# Patient Record
Sex: Female | Born: 1945
Health system: Southern US, Community
[De-identification: ages and names within clinical notes are randomized; demographics above are authoritative.]

## PROBLEM LIST (undated history)

## (undated) DIAGNOSIS — E042 Nontoxic multinodular goiter: Secondary | ICD-10-CM

## (undated) DIAGNOSIS — E785 Hyperlipidemia, unspecified: Secondary | ICD-10-CM

## (undated) DIAGNOSIS — K219 Gastro-esophageal reflux disease without esophagitis: Secondary | ICD-10-CM

## (undated) DIAGNOSIS — E079 Disorder of thyroid, unspecified: Secondary | ICD-10-CM

## (undated) DIAGNOSIS — E119 Type 2 diabetes mellitus without complications: Secondary | ICD-10-CM

## (undated) DIAGNOSIS — C801 Malignant (primary) neoplasm, unspecified: Secondary | ICD-10-CM

## (undated) DIAGNOSIS — H269 Unspecified cataract: Secondary | ICD-10-CM

## (undated) DIAGNOSIS — I10 Essential (primary) hypertension: Secondary | ICD-10-CM

## (undated) DIAGNOSIS — R0600 Dyspnea, unspecified: Secondary | ICD-10-CM

## (undated) HISTORY — PX: EYE SURGERY: SHX253

## (undated) HISTORY — DX: Unspecified cataract: H26.9

## (undated) HISTORY — DX: Essential (primary) hypertension: I10

## (undated) HISTORY — PX: TUBAL LIGATION: SHX77

## (undated) HISTORY — DX: Disorder of thyroid, unspecified: E07.9

## (undated) HISTORY — DX: Nontoxic multinodular goiter: E04.2

## (undated) HISTORY — DX: Type 2 diabetes mellitus without complications: E11.9

## (undated) HISTORY — DX: Hyperlipidemia, unspecified: E78.5

## (undated) HISTORY — PX: COSMETIC SURGERY: SHX468

## (undated) HISTORY — PX: CHOLECYSTECTOMY: SHX55

## (undated) HISTORY — PX: GALLBLADDER SURGERY: SHX652

---

## 1999-09-27 ENCOUNTER — Ambulatory Visit (HOSPITAL_COMMUNITY): Admission: RE | Admit: 1999-09-27 | Discharge: 1999-09-27 | Payer: Self-pay | Admitting: *Deleted

## 2001-04-13 ENCOUNTER — Encounter: Payer: Self-pay | Admitting: Cardiology

## 2001-04-13 ENCOUNTER — Encounter: Admission: RE | Admit: 2001-04-13 | Discharge: 2001-04-13 | Payer: Self-pay | Admitting: Cardiology

## 2004-06-04 ENCOUNTER — Ambulatory Visit (HOSPITAL_COMMUNITY): Admission: RE | Admit: 2004-06-04 | Discharge: 2004-06-04 | Payer: Self-pay | Admitting: Cardiology

## 2004-12-09 ENCOUNTER — Ambulatory Visit (HOSPITAL_COMMUNITY): Admission: RE | Admit: 2004-12-09 | Discharge: 2004-12-09 | Payer: Self-pay | Admitting: Cardiology

## 2007-06-17 ENCOUNTER — Encounter: Admission: RE | Admit: 2007-06-17 | Discharge: 2007-06-17 | Payer: Self-pay | Admitting: Cardiology

## 2007-07-06 ENCOUNTER — Ambulatory Visit (HOSPITAL_COMMUNITY): Admission: RE | Admit: 2007-07-06 | Discharge: 2007-07-06 | Payer: Self-pay | Admitting: Cardiology

## 2007-07-06 ENCOUNTER — Encounter (INDEPENDENT_AMBULATORY_CARE_PROVIDER_SITE_OTHER): Payer: Self-pay | Admitting: Cardiology

## 2007-07-06 ENCOUNTER — Ambulatory Visit: Payer: Self-pay | Admitting: Vascular Surgery

## 2007-08-26 ENCOUNTER — Encounter (HOSPITAL_COMMUNITY): Admission: RE | Admit: 2007-08-26 | Discharge: 2007-08-27 | Payer: Self-pay | Admitting: Internal Medicine

## 2011-03-07 NOTE — Procedures (Signed)
[place]  Patient:    Bridget Butler                         MRN: [id number] Proc. Date: 09/27/99 Adm. Date:  [id number] Attending:  Ardeth Perfect CC:         Ardyth Gal. Spruill, M.D.                           Procedure Report  PROCEDURE PERFORMED:  Video colonoscopy.  ENDOSCOPIST:  Knox Saliva, M.D.  INDICATIONS:  A 65 year old female with family history of colon cancer.  PREPARATION:  She is n.p.o. since midnight having taken Phospho-Soda prep and a  cld.  The mucosa is clean throughout.  DEPTH OF INSERTION:  Cecum.  PREPROCEDURE SEDATION:  She received 60 mg of Demerol and 5 mg of Versed intravenously.  In addition, she was in 2 L of nasal cannula oxygen.  DESCRIPTION OF PROCEDURE:  The Olympus video colonoscope was inserted via the rectum and advanced very easily all the way to the cecum.  Cecal landmarks were  identified and photographed.  On withdrawal, the mucosa was carefully evaluated and found to be entirely normal throughout from cecum to retroflex view of the rectum. There were no areas of inflammation, polyposis, diverticulosis or other abnormality.  The patient does have a single noninflamed external hemorrhoidal ag. She tolerated the procedure well.  Pulse, blood pressure and oximetry testing were stable throughout.  She was observed in recovery for 45 minutes and discharged ome alert with a benign abdomen.  IMPRESSION:  Normal colonoscopy with a single external hemorrhoidal tag.  RECOMMENDATIONS:  Consideration could be given to repeat screening in six to 10  years.  Return to the office if needed. DD:  09/27/99 TD:  09/28/99 Job: [id number]

## 2012-03-20 ENCOUNTER — Other Ambulatory Visit: Payer: Self-pay | Admitting: Cardiology

## 2012-06-12 ENCOUNTER — Emergency Department (HOSPITAL_COMMUNITY)
Admission: EM | Admit: 2012-06-12 | Discharge: 2012-06-12 | Discharge status: Against Medical Advice | Payer: Medicare Other | Attending: Emergency Medicine | Admitting: Emergency Medicine

## 2012-06-12 DIAGNOSIS — Z043 Encounter for examination and observation following other accident: Secondary | ICD-10-CM | POA: Insufficient documentation
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
## 2012-06-12 NOTE — ED Notes (Signed)
Patient called in [place] with no answer at this time

## 2012-06-12 NOTE — ED Notes (Signed)
Called again in waiting area; no answer.

## 2012-06-12 NOTE — ED Notes (Signed)
Called for patient in general waiting area, triage waiting, and restrooms.  No answer.  Patient LWBS after triage without notifying staff.

## 2012-06-12 NOTE — ED Notes (Signed)
Pt called from waiting room, triage wait no answer x1

## 2012-06-12 NOTE — ED Notes (Signed)
Called x1 in waiting area to re-assess patient; no answer.

## 2013-06-23 ENCOUNTER — Other Ambulatory Visit: Payer: Self-pay | Admitting: Internal Medicine

## 2013-06-23 DIAGNOSIS — E049 Nontoxic goiter, unspecified: Secondary | ICD-10-CM

## 2013-06-28 ENCOUNTER — Ambulatory Visit (HOSPITAL_COMMUNITY)
Admission: RE | Admit: 2013-06-28 | Discharge: 2013-06-28 | Disposition: A | Discharge status: Home / Self Care | Payer: Medicare Other | Source: Ambulatory Visit | Attending: Internal Medicine | Admitting: Internal Medicine

## 2013-06-28 DIAGNOSIS — E049 Nontoxic goiter, unspecified: Secondary | ICD-10-CM

## 2013-06-28 DIAGNOSIS — E042 Nontoxic multinodular goiter: Secondary | ICD-10-CM | POA: Insufficient documentation

## 2013-07-05 ENCOUNTER — Other Ambulatory Visit: Payer: Self-pay | Admitting: Internal Medicine

## 2013-07-05 DIAGNOSIS — E042 Nontoxic multinodular goiter: Secondary | ICD-10-CM

## 2013-07-05 DIAGNOSIS — E041 Nontoxic single thyroid nodule: Secondary | ICD-10-CM

## 2013-07-13 ENCOUNTER — Other Ambulatory Visit (HOSPITAL_COMMUNITY)
Admission: RE | Admit: 2013-07-13 | Discharge: 2013-07-13 | Disposition: A | Discharge status: Home / Self Care | Payer: Medicare Other | Source: Ambulatory Visit | Attending: Interventional Radiology | Admitting: Interventional Radiology

## 2013-07-13 ENCOUNTER — Ambulatory Visit
Admission: RE | Admit: 2013-07-13 | Discharge: 2013-07-13 | Disposition: A | Discharge status: Home / Self Care | Payer: Medicare Other | Source: Ambulatory Visit | Attending: Internal Medicine | Admitting: Internal Medicine

## 2013-07-13 DIAGNOSIS — E041 Nontoxic single thyroid nodule: Secondary | ICD-10-CM | POA: Insufficient documentation

## 2013-07-13 DIAGNOSIS — E042 Nontoxic multinodular goiter: Secondary | ICD-10-CM

## 2014-10-24 DIAGNOSIS — E049 Nontoxic goiter, unspecified: Secondary | ICD-10-CM | POA: Diagnosis not present

## 2014-10-24 DIAGNOSIS — E0520 Thyrotoxicosis with toxic multinodular goiter without thyrotoxic crisis or storm: Secondary | ICD-10-CM | POA: Diagnosis not present

## 2014-10-24 DIAGNOSIS — E119 Type 2 diabetes mellitus without complications: Secondary | ICD-10-CM | POA: Diagnosis not present

## 2014-10-24 DIAGNOSIS — E1165 Type 2 diabetes mellitus with hyperglycemia: Secondary | ICD-10-CM | POA: Diagnosis not present

## 2014-11-24 DIAGNOSIS — H43821 Vitreomacular adhesion, right eye: Secondary | ICD-10-CM | POA: Diagnosis not present

## 2014-11-24 DIAGNOSIS — Z1231 Encounter for screening mammogram for malignant neoplasm of breast: Secondary | ICD-10-CM | POA: Diagnosis not present

## 2014-11-24 DIAGNOSIS — E11349 Type 2 diabetes mellitus with severe nonproliferative diabetic retinopathy without macular edema: Secondary | ICD-10-CM | POA: Diagnosis not present

## 2014-11-24 DIAGNOSIS — Z803 Family history of malignant neoplasm of breast: Secondary | ICD-10-CM | POA: Diagnosis not present

## 2014-12-19 LAB — HM MAMMOGRAPHY

## 2015-01-05 DIAGNOSIS — I10 Essential (primary) hypertension: Secondary | ICD-10-CM | POA: Diagnosis not present

## 2015-01-05 DIAGNOSIS — E669 Obesity, unspecified: Secondary | ICD-10-CM | POA: Diagnosis not present

## 2015-01-05 DIAGNOSIS — N189 Chronic kidney disease, unspecified: Secondary | ICD-10-CM | POA: Diagnosis not present

## 2015-01-05 DIAGNOSIS — E785 Hyperlipidemia, unspecified: Secondary | ICD-10-CM | POA: Diagnosis not present

## 2015-01-05 DIAGNOSIS — E119 Type 2 diabetes mellitus without complications: Secondary | ICD-10-CM | POA: Diagnosis not present

## 2015-02-09 DIAGNOSIS — E11359 Type 2 diabetes mellitus with proliferative diabetic retinopathy without macular edema: Secondary | ICD-10-CM | POA: Diagnosis not present

## 2015-02-09 DIAGNOSIS — H43821 Vitreomacular adhesion, right eye: Secondary | ICD-10-CM | POA: Diagnosis not present

## 2015-04-04 DIAGNOSIS — H43821 Vitreomacular adhesion, right eye: Secondary | ICD-10-CM | POA: Diagnosis not present

## 2015-04-04 DIAGNOSIS — E11359 Type 2 diabetes mellitus with proliferative diabetic retinopathy without macular edema: Secondary | ICD-10-CM | POA: Diagnosis not present

## 2015-04-10 DIAGNOSIS — N189 Chronic kidney disease, unspecified: Secondary | ICD-10-CM | POA: Diagnosis not present

## 2015-04-10 DIAGNOSIS — E119 Type 2 diabetes mellitus without complications: Secondary | ICD-10-CM | POA: Diagnosis not present

## 2015-04-10 DIAGNOSIS — E785 Hyperlipidemia, unspecified: Secondary | ICD-10-CM | POA: Diagnosis not present

## 2015-04-10 DIAGNOSIS — I10 Essential (primary) hypertension: Secondary | ICD-10-CM | POA: Diagnosis not present

## 2015-04-10 DIAGNOSIS — E669 Obesity, unspecified: Secondary | ICD-10-CM | POA: Diagnosis not present

## 2015-04-11 ENCOUNTER — Encounter: Payer: Self-pay | Admitting: *Deleted

## 2015-08-09 DIAGNOSIS — I10 Essential (primary) hypertension: Secondary | ICD-10-CM | POA: Diagnosis not present

## 2015-08-09 DIAGNOSIS — E785 Hyperlipidemia, unspecified: Secondary | ICD-10-CM | POA: Diagnosis not present

## 2015-08-09 DIAGNOSIS — E119 Type 2 diabetes mellitus without complications: Secondary | ICD-10-CM | POA: Diagnosis not present

## 2015-09-19 DIAGNOSIS — E1165 Type 2 diabetes mellitus with hyperglycemia: Secondary | ICD-10-CM | POA: Diagnosis not present

## 2015-09-19 DIAGNOSIS — E0520 Thyrotoxicosis with toxic multinodular goiter without thyrotoxic crisis or storm: Secondary | ICD-10-CM | POA: Diagnosis not present

## 2015-10-03 DIAGNOSIS — E113511 Type 2 diabetes mellitus with proliferative diabetic retinopathy with macular edema, right eye: Secondary | ICD-10-CM | POA: Diagnosis not present

## 2015-10-03 DIAGNOSIS — E113592 Type 2 diabetes mellitus with proliferative diabetic retinopathy without macular edema, left eye: Secondary | ICD-10-CM | POA: Diagnosis not present

## 2015-10-31 DIAGNOSIS — E6609 Other obesity due to excess calories: Secondary | ICD-10-CM | POA: Diagnosis not present

## 2015-10-31 DIAGNOSIS — E089 Diabetes mellitus due to underlying condition without complications: Secondary | ICD-10-CM | POA: Diagnosis not present

## 2015-10-31 DIAGNOSIS — Z79899 Other long term (current) drug therapy: Secondary | ICD-10-CM | POA: Diagnosis not present

## 2015-10-31 DIAGNOSIS — I10 Essential (primary) hypertension: Secondary | ICD-10-CM | POA: Diagnosis not present

## 2015-11-13 DIAGNOSIS — E113511 Type 2 diabetes mellitus with proliferative diabetic retinopathy with macular edema, right eye: Secondary | ICD-10-CM | POA: Diagnosis not present

## 2015-11-16 DIAGNOSIS — E669 Obesity, unspecified: Secondary | ICD-10-CM | POA: Diagnosis not present

## 2015-11-16 DIAGNOSIS — E089 Diabetes mellitus due to underlying condition without complications: Secondary | ICD-10-CM | POA: Diagnosis not present

## 2015-11-16 DIAGNOSIS — I10 Essential (primary) hypertension: Secondary | ICD-10-CM | POA: Diagnosis not present

## 2015-12-11 DIAGNOSIS — E113511 Type 2 diabetes mellitus with proliferative diabetic retinopathy with macular edema, right eye: Secondary | ICD-10-CM | POA: Diagnosis not present

## 2015-12-12 DIAGNOSIS — E119 Type 2 diabetes mellitus without complications: Secondary | ICD-10-CM | POA: Diagnosis not present

## 2015-12-21 ENCOUNTER — Encounter: Payer: Self-pay | Admitting: Family Medicine

## 2015-12-21 DIAGNOSIS — Z803 Family history of malignant neoplasm of breast: Secondary | ICD-10-CM | POA: Diagnosis not present

## 2015-12-21 DIAGNOSIS — Z780 Asymptomatic menopausal state: Secondary | ICD-10-CM | POA: Diagnosis not present

## 2015-12-21 DIAGNOSIS — M8589 Other specified disorders of bone density and structure, multiple sites: Secondary | ICD-10-CM | POA: Insufficient documentation

## 2015-12-21 DIAGNOSIS — Z1231 Encounter for screening mammogram for malignant neoplasm of breast: Secondary | ICD-10-CM | POA: Diagnosis not present

## 2016-01-23 DIAGNOSIS — E119 Type 2 diabetes mellitus without complications: Secondary | ICD-10-CM | POA: Diagnosis not present

## 2016-01-23 DIAGNOSIS — I10 Essential (primary) hypertension: Secondary | ICD-10-CM | POA: Diagnosis not present

## 2016-02-13 DIAGNOSIS — H02834 Dermatochalasis of left upper eyelid: Secondary | ICD-10-CM | POA: Diagnosis not present

## 2016-02-13 DIAGNOSIS — H02831 Dermatochalasis of right upper eyelid: Secondary | ICD-10-CM | POA: Diagnosis not present

## 2016-02-28 DIAGNOSIS — E6609 Other obesity due to excess calories: Secondary | ICD-10-CM | POA: Diagnosis not present

## 2016-02-28 DIAGNOSIS — Z0001 Encounter for general adult medical examination with abnormal findings: Secondary | ICD-10-CM | POA: Diagnosis not present

## 2016-02-28 DIAGNOSIS — I10 Essential (primary) hypertension: Secondary | ICD-10-CM | POA: Diagnosis not present

## 2016-02-28 DIAGNOSIS — E119 Type 2 diabetes mellitus without complications: Secondary | ICD-10-CM | POA: Diagnosis not present

## 2016-03-12 DIAGNOSIS — H43811 Vitreous degeneration, right eye: Secondary | ICD-10-CM | POA: Diagnosis not present

## 2016-03-12 DIAGNOSIS — E113593 Type 2 diabetes mellitus with proliferative diabetic retinopathy without macular edema, bilateral: Secondary | ICD-10-CM | POA: Diagnosis not present

## 2016-03-12 DIAGNOSIS — H3582 Retinal ischemia: Secondary | ICD-10-CM | POA: Diagnosis not present

## 2016-04-10 ENCOUNTER — Other Ambulatory Visit: Payer: Self-pay | Admitting: Gastroenterology

## 2016-04-10 DIAGNOSIS — K219 Gastro-esophageal reflux disease without esophagitis: Secondary | ICD-10-CM | POA: Diagnosis not present

## 2016-04-10 DIAGNOSIS — K635 Polyp of colon: Secondary | ICD-10-CM | POA: Diagnosis not present

## 2016-04-10 DIAGNOSIS — Z1211 Encounter for screening for malignant neoplasm of colon: Secondary | ICD-10-CM | POA: Diagnosis not present

## 2016-04-10 DIAGNOSIS — D125 Benign neoplasm of sigmoid colon: Secondary | ICD-10-CM | POA: Diagnosis not present

## 2016-04-10 DIAGNOSIS — K222 Esophageal obstruction: Secondary | ICD-10-CM | POA: Diagnosis not present

## 2016-04-10 DIAGNOSIS — K640 First degree hemorrhoids: Secondary | ICD-10-CM | POA: Diagnosis not present

## 2016-07-01 DIAGNOSIS — E089 Diabetes mellitus due to underlying condition without complications: Secondary | ICD-10-CM | POA: Diagnosis not present

## 2016-07-01 DIAGNOSIS — E669 Obesity, unspecified: Secondary | ICD-10-CM | POA: Diagnosis not present

## 2016-07-01 DIAGNOSIS — I10 Essential (primary) hypertension: Secondary | ICD-10-CM | POA: Diagnosis not present

## 2016-07-16 DIAGNOSIS — E113593 Type 2 diabetes mellitus with proliferative diabetic retinopathy without macular edema, bilateral: Secondary | ICD-10-CM | POA: Diagnosis not present

## 2016-07-16 DIAGNOSIS — H43811 Vitreous degeneration, right eye: Secondary | ICD-10-CM | POA: Diagnosis not present

## 2016-07-16 DIAGNOSIS — H3582 Retinal ischemia: Secondary | ICD-10-CM | POA: Diagnosis not present

## 2016-07-18 DIAGNOSIS — E089 Diabetes mellitus due to underlying condition without complications: Secondary | ICD-10-CM | POA: Diagnosis not present

## 2016-07-18 DIAGNOSIS — Z23 Encounter for immunization: Secondary | ICD-10-CM | POA: Diagnosis not present

## 2016-07-18 DIAGNOSIS — E119 Type 2 diabetes mellitus without complications: Secondary | ICD-10-CM | POA: Diagnosis not present

## 2016-07-18 DIAGNOSIS — I10 Essential (primary) hypertension: Secondary | ICD-10-CM | POA: Diagnosis not present

## 2016-07-29 DIAGNOSIS — R0683 Snoring: Secondary | ICD-10-CM | POA: Diagnosis not present

## 2016-08-01 DIAGNOSIS — E6609 Other obesity due to excess calories: Secondary | ICD-10-CM | POA: Diagnosis not present

## 2016-08-01 DIAGNOSIS — E119 Type 2 diabetes mellitus without complications: Secondary | ICD-10-CM | POA: Diagnosis not present

## 2016-08-01 DIAGNOSIS — I10 Essential (primary) hypertension: Secondary | ICD-10-CM | POA: Diagnosis not present

## 2016-09-03 DIAGNOSIS — Z6830 Body mass index (BMI) 30.0-30.9, adult: Secondary | ICD-10-CM | POA: Diagnosis not present

## 2016-09-03 DIAGNOSIS — E6609 Other obesity due to excess calories: Secondary | ICD-10-CM | POA: Diagnosis not present

## 2016-09-03 DIAGNOSIS — E119 Type 2 diabetes mellitus without complications: Secondary | ICD-10-CM | POA: Diagnosis not present

## 2016-09-03 DIAGNOSIS — I10 Essential (primary) hypertension: Secondary | ICD-10-CM | POA: Diagnosis not present

## 2016-11-14 DIAGNOSIS — E6609 Other obesity due to excess calories: Secondary | ICD-10-CM | POA: Diagnosis not present

## 2016-11-14 DIAGNOSIS — I10 Essential (primary) hypertension: Secondary | ICD-10-CM | POA: Diagnosis not present

## 2016-11-14 DIAGNOSIS — E119 Type 2 diabetes mellitus without complications: Secondary | ICD-10-CM | POA: Diagnosis not present

## 2016-12-15 DIAGNOSIS — E119 Type 2 diabetes mellitus without complications: Secondary | ICD-10-CM | POA: Diagnosis not present

## 2016-12-15 DIAGNOSIS — E6609 Other obesity due to excess calories: Secondary | ICD-10-CM | POA: Diagnosis not present

## 2016-12-15 DIAGNOSIS — I10 Essential (primary) hypertension: Secondary | ICD-10-CM | POA: Diagnosis not present

## 2017-01-13 DIAGNOSIS — H3582 Retinal ischemia: Secondary | ICD-10-CM | POA: Diagnosis not present

## 2017-01-13 DIAGNOSIS — E113593 Type 2 diabetes mellitus with proliferative diabetic retinopathy without macular edema, bilateral: Secondary | ICD-10-CM | POA: Diagnosis not present

## 2017-01-13 DIAGNOSIS — H43811 Vitreous degeneration, right eye: Secondary | ICD-10-CM | POA: Diagnosis not present

## 2017-03-17 DIAGNOSIS — E1129 Type 2 diabetes mellitus with other diabetic kidney complication: Secondary | ICD-10-CM | POA: Diagnosis not present

## 2017-03-17 DIAGNOSIS — I10 Essential (primary) hypertension: Secondary | ICD-10-CM | POA: Diagnosis not present

## 2017-03-17 DIAGNOSIS — E6609 Other obesity due to excess calories: Secondary | ICD-10-CM | POA: Diagnosis not present

## 2017-03-17 DIAGNOSIS — E119 Type 2 diabetes mellitus without complications: Secondary | ICD-10-CM | POA: Diagnosis not present

## 2017-03-17 DIAGNOSIS — E669 Obesity, unspecified: Secondary | ICD-10-CM | POA: Diagnosis not present

## 2017-03-17 DIAGNOSIS — H3530 Unspecified macular degeneration: Secondary | ICD-10-CM | POA: Diagnosis not present

## 2017-03-17 DIAGNOSIS — Z7289 Other problems related to lifestyle: Secondary | ICD-10-CM | POA: Diagnosis not present

## 2017-04-16 DIAGNOSIS — Z1231 Encounter for screening mammogram for malignant neoplasm of breast: Secondary | ICD-10-CM | POA: Diagnosis not present

## 2017-04-16 DIAGNOSIS — Z803 Family history of malignant neoplasm of breast: Secondary | ICD-10-CM | POA: Diagnosis not present

## 2017-04-16 LAB — HM MAMMOGRAPHY

## 2017-06-15 DIAGNOSIS — E6609 Other obesity due to excess calories: Secondary | ICD-10-CM | POA: Diagnosis not present

## 2017-06-15 DIAGNOSIS — I10 Essential (primary) hypertension: Secondary | ICD-10-CM | POA: Diagnosis not present

## 2017-06-15 DIAGNOSIS — E11311 Type 2 diabetes mellitus with unspecified diabetic retinopathy with macular edema: Secondary | ICD-10-CM | POA: Diagnosis not present

## 2017-06-15 DIAGNOSIS — E1129 Type 2 diabetes mellitus with other diabetic kidney complication: Secondary | ICD-10-CM | POA: Diagnosis not present

## 2017-06-15 DIAGNOSIS — Z0000 Encounter for general adult medical examination without abnormal findings: Secondary | ICD-10-CM | POA: Diagnosis not present

## 2017-06-30 DIAGNOSIS — H02413 Mechanical ptosis of bilateral eyelids: Secondary | ICD-10-CM | POA: Diagnosis not present

## 2017-06-30 DIAGNOSIS — H53489 Generalized contraction of visual field, unspecified eye: Secondary | ICD-10-CM | POA: Diagnosis not present

## 2017-06-30 DIAGNOSIS — H02831 Dermatochalasis of right upper eyelid: Secondary | ICD-10-CM | POA: Diagnosis not present

## 2017-06-30 DIAGNOSIS — H02834 Dermatochalasis of left upper eyelid: Secondary | ICD-10-CM | POA: Diagnosis not present

## 2017-07-15 DIAGNOSIS — H43821 Vitreomacular adhesion, right eye: Secondary | ICD-10-CM | POA: Diagnosis not present

## 2017-07-15 DIAGNOSIS — H3582 Retinal ischemia: Secondary | ICD-10-CM | POA: Diagnosis not present

## 2017-07-15 DIAGNOSIS — H43811 Vitreous degeneration, right eye: Secondary | ICD-10-CM | POA: Diagnosis not present

## 2017-07-15 DIAGNOSIS — E113593 Type 2 diabetes mellitus with proliferative diabetic retinopathy without macular edema, bilateral: Secondary | ICD-10-CM | POA: Diagnosis not present

## 2017-08-18 DIAGNOSIS — H02413 Mechanical ptosis of bilateral eyelids: Secondary | ICD-10-CM | POA: Diagnosis not present

## 2017-09-14 DIAGNOSIS — E119 Type 2 diabetes mellitus without complications: Secondary | ICD-10-CM | POA: Diagnosis not present

## 2017-09-14 DIAGNOSIS — E6609 Other obesity due to excess calories: Secondary | ICD-10-CM | POA: Diagnosis not present

## 2017-09-14 DIAGNOSIS — E118 Type 2 diabetes mellitus with unspecified complications: Secondary | ICD-10-CM | POA: Diagnosis not present

## 2017-09-14 DIAGNOSIS — I10 Essential (primary) hypertension: Secondary | ICD-10-CM | POA: Diagnosis not present

## 2017-09-14 DIAGNOSIS — E1129 Type 2 diabetes mellitus with other diabetic kidney complication: Secondary | ICD-10-CM | POA: Diagnosis not present

## 2017-09-18 DIAGNOSIS — I10 Essential (primary) hypertension: Secondary | ICD-10-CM | POA: Diagnosis not present

## 2017-12-21 DIAGNOSIS — E785 Hyperlipidemia, unspecified: Secondary | ICD-10-CM | POA: Diagnosis not present

## 2017-12-21 DIAGNOSIS — I10 Essential (primary) hypertension: Secondary | ICD-10-CM | POA: Diagnosis not present

## 2017-12-21 DIAGNOSIS — E118 Type 2 diabetes mellitus with unspecified complications: Secondary | ICD-10-CM | POA: Diagnosis not present

## 2017-12-29 ENCOUNTER — Encounter: Payer: Self-pay | Admitting: Family Medicine

## 2017-12-29 ENCOUNTER — Ambulatory Visit (INDEPENDENT_AMBULATORY_CARE_PROVIDER_SITE_OTHER): Payer: Medicare HMO | Admitting: Family Medicine

## 2017-12-29 ENCOUNTER — Other Ambulatory Visit: Payer: Self-pay

## 2017-12-29 VITALS — BP 122/60 | HR 78 | Temp 98.4°F | Resp 18 | Ht 62.5 in | Wt 205.4 lb

## 2017-12-29 DIAGNOSIS — Z1231 Encounter for screening mammogram for malignant neoplasm of breast: Secondary | ICD-10-CM | POA: Diagnosis not present

## 2017-12-29 DIAGNOSIS — E2839 Other primary ovarian failure: Secondary | ICD-10-CM | POA: Diagnosis not present

## 2017-12-29 DIAGNOSIS — K219 Gastro-esophageal reflux disease without esophagitis: Secondary | ICD-10-CM

## 2017-12-29 DIAGNOSIS — IMO0002 Reserved for concepts with insufficient information to code with codable children: Secondary | ICD-10-CM

## 2017-12-29 DIAGNOSIS — E1165 Type 2 diabetes mellitus with hyperglycemia: Secondary | ICD-10-CM | POA: Diagnosis not present

## 2017-12-29 DIAGNOSIS — E785 Hyperlipidemia, unspecified: Secondary | ICD-10-CM | POA: Diagnosis not present

## 2017-12-29 DIAGNOSIS — I10 Essential (primary) hypertension: Secondary | ICD-10-CM | POA: Diagnosis not present

## 2017-12-29 DIAGNOSIS — Z794 Long term (current) use of insulin: Secondary | ICD-10-CM

## 2017-12-29 DIAGNOSIS — Z1239 Encounter for other screening for malignant neoplasm of breast: Secondary | ICD-10-CM

## 2017-12-29 LAB — POCT GLYCOSYLATED HEMOGLOBIN (HGB A1C): HEMOGLOBIN A1C: 7.6

## 2017-12-29 LAB — GLUCOSE, POCT (MANUAL RESULT ENTRY): POC GLUCOSE: 129 mg/dL — AB (ref 70–99)

## 2017-12-29 MED ORDER — LISINOPRIL-HYDROCHLOROTHIAZIDE 20-12.5 MG PO TABS: 2.0000 | ORAL_TABLET | Freq: Every day | ORAL | 0 refills | Status: DC

## 2017-12-29 MED ORDER — INSULIN GLARGINE 100 UNIT/ML SOLOSTAR PEN: 20.0000 [IU] | PEN_INJECTOR | SUBCUTANEOUS | 0 refills | Status: DC

## 2017-12-29 MED ORDER — RANITIDINE HCL 150 MG PO TABS: 150.0000 mg | ORAL_TABLET | Freq: Two times a day (BID) | ORAL | 1 refills | Status: DC

## 2017-12-29 MED ORDER — DULAGLUTIDE 0.75 MG/0.5ML ~~LOC~~ SOAJ: 0.5000 mL | SUBCUTANEOUS | 0 refills | Status: DC

## 2017-12-29 MED ORDER — DULAGLUTIDE 0.75 MG/0.5ML ~~LOC~~ SOAJ: 0.5000 mL | SUBCUTANEOUS | 1 refills | Status: DC

## 2017-12-29 NOTE — Progress Notes (Addendum)
By signing my name below, I, Theresia Bough, attest that this documentation has been prepared under the direction and in the presence of [name], MD Electronically Signed: Theresia Bough, Medical Scribe 12/29/17 at 11:52 AM  Subjective:    Patient ID: Bridget Butler, female    DOB: Jan 03, 1946, 72 y.o.   MRN: [id number] Chief Complaint  Patient presents with  . Establish Care    pt states she wanted to get to know provider and states she was looking for a new PCP.    HPI Bridget Butler is a 72 y.o. female who presents to Primary Care at [facility] to establish care. She has PMHx of HTN, DM, HLD, and GERD. She was previously followed by Dr. Criss Rosales.   DM She takes 30 units lantus solostar in the AM and janumet 50/100 daily. She states her fasting sugars are between 90-115 and she only checks them in the morning with her meter. She notes to have hypoglycemia sometimes when she does not eat breakfast but she usually does eat something in the morning. .  Vision She is s/p surgery and sees a ophthalmologist, Dr. Baird Cancer regularly.    HTN She takes Lisinopril 20 mg at night and 1 tablet of lisinopril-HCTZ in the morning daily. She states she used to take furosemide but discontinued it due to urinary frequency. She is currently not followed by a Cardiologist.   HLD She has tried Lipitor and Crestor in the past but has discontinued due to muscle cramps followed a week after starting the medication. She takes Zetia currently. She takes 1000 mg fish oil daily.   GERD She takes omeprazole 40 mg in the AM daily. She reports mild-moderate symptoms. She is agreeable to try Zantac.      Cancer Screening Colon CA: She states her last colonoscopy was 2 years ago but she does not know where  Breast CA: She is due for a mammogram at [facility] She denies hx of osteoporosis or osteopenia. She is due for DEXA at [facility]. She has taken Ca and Vitamin D in the past but not currently taking any  supplement.    Review of Systems  Constitutional:       (+) hypoglycemic symptoms when she does not eat and is active  (+) active adult  Eyes:       (+) s/p eye surgery  Gastrointestinal:       (+) mild-moderate GERD symptoms   Musculoskeletal: Negative for myalgias.     Current Outpatient Medications on File Prior to Visit  Medication Sig Dispense Refill  . aspirin EC 81 MG tablet Take 81 mg by mouth daily.    [name] Kitchen ezetimibe (ZETIA) 10 MG tablet Take 10 mg by mouth daily.    . Insulin Glargine (LANTUS SOLOSTAR) 100 UNIT/ML Solostar Pen Inject 30 Units into the skin every morning.    [name] Kitchen lisinopril (PRINIVIL,ZESTRIL) 20 MG tablet Take 20 mg by mouth daily.    [name] Kitchen lisinopril-hydrochlorothiazide (PRINZIDE,ZESTORETIC) 20-12.5 MG per tablet Take 1 tablet by mouth daily.    . Multiple Vitamin (MULTIVITAMIN WITH MINERALS) TABS Take 1 tablet by mouth daily.    . Omega-3 Fatty Acids (FISH OIL) 1000 MG CAPS Take 1 capsule by mouth daily.    [name] Kitchen omeprazole (PRILOSEC) 40 MG capsule Take 40 mg by mouth daily.    [name] Kitchen ACCU-CHEK SMARTVIEW test strip     . [DISCONTINUED] sitaGLIPtin-metformin (JANUMET) 50-1000 MG tablet Take 1 tablet by mouth daily with lunch.  No current facility-administered medications on file prior to visit.    Past Medical History:  Diagnosis Date  . Cataract   . Diabetes mellitus without complication ([place])   . Thyroid disease    Past Surgical History:  Procedure Laterality Date  . COSMETIC SURGERY    . GALLBLADDER SURGERY    . TUBAL LIGATION     No Known Allergies Family History  Problem Relation Age of Onset  . Heart disease Mother   . Hypertension Mother   . Cancer Sister   . Diabetes Sister    Social History   Socioeconomic History  . Marital status: Married    Spouse name: Not on file  . Number of children: Not on file  . Years of education: Not on file  . Highest education level: Not on file  Occupational History  . Not on file  Social Needs  .  Financial resource strain: Not on file  . Food insecurity:    Worry: Not on file    Inability: Not on file  . Transportation needs:    Medical: Not on file    Non-medical: Not on file  Tobacco Use  . Smoking status: Never Smoker  . Smokeless tobacco: Never Used  Substance and Sexual Activity  . Alcohol use: No    Frequency: Never  . Drug use: No  . Sexual activity: Not on file  Lifestyle  . Physical activity:    Days per week: Not on file    Minutes per session: Not on file  . Stress: Not on file  Relationships  . Social connections:    Talks on phone: Not on file    Gets together: Not on file    Attends religious service: Not on file    Active member of club or organization: Not on file    Attends meetings of clubs or organizations: Not on file    Relationship status: Not on file  Other Topics Concern  . Not on file  Social History Narrative  . Not on file   Depression screen [facility] 2/9 12/29/2017  Decreased Interest 0  Down, Depressed, Hopeless 0  PHQ - 2 Score 0        Objective:   Physical Exam  Constitutional: She is oriented to person, place, and time. She appears well-developed and well-nourished. No distress.  HENT:  Head: Normocephalic and atraumatic.  Right Ear: External ear normal.  Left Ear: External ear normal.  Eyes: Conjunctivae are normal. No scleral icterus.  Neck: Normal range of motion. Neck supple. No thyromegaly present.  Cardiovascular: Normal rate, regular rhythm, normal heart sounds and intact distal pulses.  Pulmonary/Chest: Effort normal and breath sounds normal. No respiratory distress.  Musculoskeletal: She exhibits no edema.  Lymphadenopathy:    She has no cervical adenopathy.  Neurological: She is alert and oriented to person, place, and time.  Skin: Skin is warm and dry. She is not diaphoretic. No erythema.  Psychiatric: She has a normal mood and affect. Her behavior is normal.    Results for orders placed or performed in visit on  12/29/17  POCT glycosylated hemoglobin (Hb A1C)  Result Value Ref Range   Hemoglobin A1C 7.6   POCT glucose (manual entry)  Result Value Ref Range   POC Glucose 129 (A) 70 - 99 mg/dl    Vitals:   12/29/17 1108  BP: 122/60  Pulse: 78  Resp: 18  Temp: 98.4 F (36.9 C)  TempSrc: Oral  SpO2: 98%  Weight:  205 lb 6.4 oz (93.2 kg)  Height: 5' 2.5" (1.588 m)    Diabetic Foot Exam - Simple   Simple Foot Form Diabetic Foot exam was performed with the following findings:  Yes 12/29/2017 11:11 AM  Visual Inspection See comments:  Yes Sensation Testing See comments:  Yes Pulse Check See comments:  Yes Comments Pt states she could not feel touch sensation on the heels of both feet, I had trouble find pulse of right foot. Both feet and ankles appear swollen.         Assessment & Plan:   1. Insulin dependent type 2 diabetes mellitus, uncontrolled ([place])   Could not provide urine sample so ordered for the future. Has been on lantus solostar pen 30u qam and janumet 50-1000 qd w/ occ a.m. Hypoglycemia when forgets/has to skp breakfast.   -  Try trulicity 4.09 instead as GLP-1 willl help more with weight loss than the DPP4. Stop Jaunumet.  Consider adding back in metformin and/or increasing trulicity at next visit.  - Decrease lantus from 30 to 20 qam - Cont to titrate down by 2u whenever has more than 1 fluke hypoglycemic episode. opto UTD with Dr. Janora Norlander     2. Essential hypertension - was on lisinopril-hctz qam and lisinopril 20 qhs - but to save pt an extra co-pay and med bottle, we will try changing her to the lisinopril-hctz 20-12.5 2 tabs qam (so d/c lisinopril 20 qhs). Could not tolerate lasix 2/2 urinary frequency so if urine sxs worsen again when doubling up on the hctz, call and we can change her back to prior regimen., no prior cardiac hx or stress testing.  3. Hyperlipidemia LDL goal <100 - could not tolerate lipitor or crestor due to myalgias within a week but tolerating  Zetia and 1000mg  fish oil qd.  LIPIDS NOT AT GOAL - LDL 130 - discuss w/ pt at f/u - red yeast rice, livalo just 2-3x/w on lowest dose, Repatha. .  .  4. Gastroesophageal reflux disease, esophagitis presence not specified - controlled on omeprazole 40 qam and will do trial of stepping down on therapy to ranitidine instead.  5. Estrogen deficiency - refer for DEXA bone scan at [facility]  6. Screening for breast cancer - due for mammogram at [facility] in 3 mos.  Orders Placed This Encounter  Procedures  . MM Digital Screening    Standing Status:   Future    Standing Expiration Date:   03/01/2019    Order Specific Question:   Reason for Exam (SYMPTOM  OR DIAGNOSIS REQUIRED)    Answer:   screening    Order Specific Question:   Preferred imaging location?    Answer:   External    Comments:   [facility]  . DG Bone Density    Standing Status:   Future    Standing Expiration Date:   03/01/2019    Order Specific Question:   Reason for Exam (SYMPTOM  OR DIAGNOSIS REQUIRED)    Answer:   estrogen deficiency    Order Specific Question:   Preferred imaging location?    Answer:   External    Comments:   [facility]  . Comprehensive metabolic panel    Order Specific Question:   Has the patient fasted?    Answer:   Yes  . Lipid panel    Order Specific Question:   Has the patient fasted?    Answer:   Yes  . Microalbumin/Creatinine Ratio, Urine  Standing Status:   Future    Standing Expiration Date:   12/30/2018  . POCT glycosylated hemoglobin (Hb A1C)  . POCT glucose (manual entry)  . POCT urinalysis dipstick    Standing Status:   Future    Standing Expiration Date:   01/29/2018  . HM DIABETES FOOT EXAM    Meds ordered this encounter  Medications  . ranitidine (ZANTAC) 150 MG tablet    Sig: Take 1 tablet (150 mg total) by mouth 2 (two) times daily.    Dispense:  180 tablet    Refill:  1  . DISCONTD: Dulaglutide (TRULICITY) 2.26 JF/3.5KT SOPN    Sig: Inject 0.5 mLs into the skin once a week.     Dispense:  12 pen    Refill:  1  . lisinopril-hydrochlorothiazide (PRINZIDE,ZESTORETIC) 20-12.5 MG tablet    Sig: Take 2 tablets by mouth daily.    Dispense:  180 tablet    Refill:  0    Pt does not need currently. Please d/c prior rxs for this med with sig 1 qd and lisinopril 20.  . Insulin Glargine (LANTUS SOLOSTAR) 100 UNIT/ML Solostar Pen    Sig: Inject 20 Units into the skin every morning.    Dispense:  15 mL    Refill:  0    Pt does not need currently. Please add this on as refills to current rx.  . Dulaglutide (TRULICITY) 6.25 WL/8.9HT SOPN    Sig: Inject 0.5 mLs into the skin once a week.    Dispense:  4 pen    Refill:  0    I personally performed the services described in this documentation, which was scribed in my presence. The recorded information has been reviewed and considered, and addended by me as needed.   Delman Cheadle, M.D.  Primary Care at [facility] [address] [place], [place] [address] [phone] phone [phone] fax  01/31/18 5:30 PM

## 2017-12-29 NOTE — Patient Instructions (Addendum)
If you are having more hypoglycemic episodes, continue to slowly decrease your lantus insulin dose by 2u at a time until they resolve. Make sure you are eating small amounts frequently.  We recommend that you schedule a mammogram for breast cancer screening. Typically, you do not need a referral to do this. Please contact a local imaging center to schedule your mammogram. [facility] - [phone]   IF you received an x-ray today, you will receive an invoice from [facility]. Please contact [facility] at [phone] with questions or concerns regarding your invoice.   IF you received labwork today, you will receive an invoice from [facility]. Please contact [facility] at [phone] with questions or concerns regarding your invoice.   Our billing staff will not be able to assist you with questions regarding bills from these companies.  You will be contacted with the lab results as soon as they are available. The fastest way to get your results is to activate your My Chart account. Instructions are located on the last page of this paperwork. If you have not heard from [place] regarding the results in 2 weeks, please contact this office.     Exercising to Lose Weight Exercising can help you to lose weight. In order to lose weight through exercise, you need to do vigorous-intensity exercise. You can tell that you are exercising with vigorous intensity if you are breathing very hard and fast and cannot hold a conversation while exercising. Moderate-intensity exercise helps to maintain your current weight. You can tell that you are exercising at a moderate level if you have a higher heart rate and faster breathing, but you are still able to hold a conversation. How often should I exercise? Choose an activity that you enjoy and set realistic goals. Your health care provider can help you to make an activity plan that works for you. Exercise regularly as directed by your health  care provider. This may include:  Doing resistance training twice each week, such as: ? Push-ups. ? Sit-ups. ? Lifting weights. ? Using resistance bands.  Doing a given intensity of exercise for a given amount of time. Choose from these options: ? 150 minutes of moderate-intensity exercise every week. ? 75 minutes of vigorous-intensity exercise every week. ? A mix of moderate-intensity and vigorous-intensity exercise every week.  Children, pregnant women, people who are out of shape, people who are overweight, and older adults may need to consult a health care provider for individual recommendations. If you have any sort of medical condition, be sure to consult your health care provider before starting a new exercise program. What are some activities that can help me to lose weight?  Walking at a rate of at least 4.5 miles an hour.  Jogging or running at a rate of 5 miles per hour.  Biking at a rate of at least 10 miles per hour.  Lap swimming.  Roller-skating or in-line skating.  Cross-country skiing.  Vigorous competitive sports, such as football, basketball, and soccer.  Jumping rope.  Aerobic dancing. How can I be more active in my day-to-day activities?  Use the stairs instead of the elevator.  Take a walk during your lunch break.  If you drive, park your car farther away from work or school.  If you take public transportation, get off one stop early and walk the rest of the way.  Make all of your phone calls while standing up and walking around.  Get up, stretch, and walk around every 30  minutes throughout the day. What guidelines should I follow while exercising?  Do not exercise so much that you hurt yourself, feel dizzy, or get very short of breath.  Consult your health care provider prior to starting a new exercise program.  Wear comfortable clothes and shoes with good support.  Drink plenty of water while you exercise to prevent dehydration or heat  stroke. Body water is lost during exercise and must be replaced.  Work out until you breathe faster and your heart beats faster. This information is not intended to replace advice given to you by your health care provider. Make sure you discuss any questions you have with your health care provider. Document Released: 11/08/2010 Document Revised: 03/13/2016 Document Reviewed: 03/09/2014 Elsevier Interactive Patient Education  2018 [employer].  Blood Glucose Monitoring, Adult Monitoring your blood sugar (glucose) helps you manage your diabetes. It also helps you and your health care provider determine how well your diabetes management plan is working. Blood glucose monitoring involves checking your blood glucose as often as directed, and keeping a record (log) of your results over time. Why should I monitor my blood glucose? Checking your blood glucose regularly can:  Help you understand how food, exercise, illnesses, and medicines affect your blood glucose.  Let you know what your blood glucose is at any time. You can quickly tell if you are having low blood glucose (hypoglycemia) or high blood glucose (hyperglycemia).  Help you and your health care provider adjust your medicines as needed.  When should I check my blood glucose? Follow instructions from your health care provider about how often to check your blood glucose. This may depend on:  The type of diabetes you have.  How well-controlled your diabetes is.  Medicines you are taking.  If you have type 1 diabetes:  Check your blood glucose at least 2 times a day.  Also check your blood glucose: ? Before every insulin injection. ? Before and after exercise. ? Between meals. ? 2 hours after a meal. ? Occasionally between 2:00 a.m. and 3:00 a.m., as directed. ? Before potentially dangerous tasks, like driving or using heavy machinery. ? At bedtime.  You may need to check your blood glucose more often, up to 6-10 times a  day: ? If you use an insulin pump. ? If you need multiple daily injections (MDI). ? If your diabetes is not well-controlled. ? If you are ill. ? If you have a history of severe hypoglycemia. ? If you have a history of not knowing when your blood glucose is getting low (hypoglycemia unawareness). If you have type 2 diabetes:  If you take insulin or other diabetes medicines, check your blood glucose at least 2 times a day.  If you are on intensive insulin therapy, check your blood glucose at least 4 times a day. Occasionally, you may also need to check between 2:00 a.m. and 3:00 a.m., as directed.  Also check your blood glucose: ? Before and after exercise. ? Before potentially dangerous tasks, like driving or using heavy machinery.  You may need to check your blood glucose more often if: ? Your medicine is being adjusted. ? Your diabetes is not well-controlled. ? You are ill. What is a blood glucose log?  A blood glucose log is a record of your blood glucose readings. It helps you and your health care provider: ? Look for patterns in your blood glucose over time. ? Adjust your diabetes management plan as needed.  Every time you check  your blood glucose, write down your result and notes about things that may be affecting your blood glucose, such as your diet and exercise for the day.  Most glucose meters store a record of glucose readings in the meter. Some meters allow you to download your records to a computer. How do I check my blood glucose? Follow these steps to get accurate readings of your blood glucose: Supplies needed   Blood glucose meter.  Test strips for your meter. Each meter has its own strips. You must use the strips that come with your meter.  A needle to prick your finger (lancet). Do not use lancets more than once.  A device that holds the lancet (lancing device).  A journal or log book to write down your results. Procedure  Wash your hands with soap and  water.  Prick the side of your finger (not the tip) with the lancet. Use a different finger each time.  Gently rub the finger until a small drop of blood appears.  Follow instructions that come with your meter for inserting the test strip, applying blood to the strip, and using your blood glucose meter.  Write down your result and any notes. Alternative testing sites  Some meters allow you to use areas of your body other than your finger (alternative sites) to test your blood.  If you think you may have hypoglycemia, or if you have hypoglycemia unawareness, do not use alternative sites. Use your finger instead.  Alternative sites may not be as accurate as the fingers, because blood flow is slower in these areas. This means that the result you get may be delayed, and it may be different from the result that you would get from your finger.  The most common alternative sites are: ? Forearm. ? Thigh. ? Palm of the hand. Additional tips  Always keep your supplies with you.  If you have questions or need help, all blood glucose meters have a 24-hour "hotline" number that you can call. You may also contact your health care provider.  After you use a few boxes of test strips, adjust (calibrate) your blood glucose meter by following instructions that came with your meter. This information is not intended to replace advice given to you by your health care provider. Make sure you discuss any questions you have with your health care provider. Document Released: 10/09/2003 Document Revised: 04/25/2016 Document Reviewed: 03/17/2016 Elsevier Interactive Patient Education  2017 [employer].

## 2017-12-30 LAB — LIPID PANEL
CHOLESTEROL TOTAL: 222 mg/dL — AB (ref 100–199)
Chol/HDL Ratio: 3.5 ratio (ref 0.0–4.4)
HDL: 64 mg/dL (ref >39)
LDL CALC: 133 mg/dL — AB (ref 0–99)
TRIGLYCERIDES: 127 mg/dL (ref 0–149)
VLDL CHOLESTEROL CAL: 25 mg/dL (ref 5–40)

## 2017-12-30 LAB — COMPREHENSIVE METABOLIC PANEL
ALK PHOS: 67 IU/L (ref 39–117)
ALT: 16 IU/L (ref 0–32)
AST: 15 IU/L (ref 0–40)
Albumin/Globulin Ratio: 1.2 (ref 1.2–2.2)
Albumin: 4.1 g/dL (ref 3.5–4.8)
BILIRUBIN TOTAL: 0.5 mg/dL (ref 0.0–1.2)
BUN/Creatinine Ratio: 17 (ref 12–28)
BUN: 18 mg/dL (ref 8–27)
CHLORIDE: 101 mmol/L (ref 96–106)
CO2: 24 mmol/L (ref 20–29)
CREATININE: 1.08 mg/dL — AB (ref 0.57–1.00)
Calcium: 9.6 mg/dL (ref 8.7–10.3)
GFR calc Af Amer: 60 mL/min/{1.73_m2} (ref >59)
GFR calc non Af Amer: 52 mL/min/{1.73_m2} — ABNORMAL LOW (ref >59)
GLUCOSE: 130 mg/dL — AB (ref 65–99)
Globulin, Total: 3.3 g/dL (ref 1.5–4.5)
Potassium: 4.5 mmol/L (ref 3.5–5.2)
Sodium: 139 mmol/L (ref 134–144)
Total Protein: 7.4 g/dL (ref 6.0–8.5)

## 2018-01-08 ENCOUNTER — Telehealth: Payer: Self-pay

## 2018-01-08 NOTE — Telephone Encounter (Signed)
Notice from [employer] approved through 01/09/2019

## 2018-01-13 DIAGNOSIS — H43821 Vitreomacular adhesion, right eye: Secondary | ICD-10-CM | POA: Diagnosis not present

## 2018-01-13 DIAGNOSIS — E113593 Type 2 diabetes mellitus with proliferative diabetic retinopathy without macular edema, bilateral: Secondary | ICD-10-CM | POA: Diagnosis not present

## 2018-01-13 DIAGNOSIS — H3582 Retinal ischemia: Secondary | ICD-10-CM | POA: Diagnosis not present

## 2018-01-13 DIAGNOSIS — H43812 Vitreous degeneration, left eye: Secondary | ICD-10-CM | POA: Diagnosis not present

## 2018-01-27 ENCOUNTER — Telehealth: Payer: Self-pay

## 2018-01-27 NOTE — Telephone Encounter (Signed)
[employer] form completed-pt had active rx for janumet 50-1000mg - this was dc'd by Dr. Brigitte Pulse on 12/29/2017 - faxed form back with this info.

## 2018-01-31 DIAGNOSIS — Z794 Long term (current) use of insulin: Principal | ICD-10-CM

## 2018-01-31 DIAGNOSIS — E119 Type 2 diabetes mellitus without complications: Secondary | ICD-10-CM | POA: Insufficient documentation

## 2018-01-31 DIAGNOSIS — E785 Hyperlipidemia, unspecified: Secondary | ICD-10-CM | POA: Insufficient documentation

## 2018-01-31 DIAGNOSIS — E1165 Type 2 diabetes mellitus with hyperglycemia: Secondary | ICD-10-CM | POA: Insufficient documentation

## 2018-01-31 DIAGNOSIS — IMO0002 Reserved for concepts with insufficient information to code with codable children: Secondary | ICD-10-CM | POA: Insufficient documentation

## 2018-01-31 DIAGNOSIS — K219 Gastro-esophageal reflux disease without esophagitis: Secondary | ICD-10-CM | POA: Insufficient documentation

## 2018-01-31 DIAGNOSIS — I10 Essential (primary) hypertension: Secondary | ICD-10-CM | POA: Insufficient documentation

## 2018-01-31 NOTE — Telephone Encounter (Signed)
Perfect. Thanks.

## 2018-02-12 ENCOUNTER — Encounter: Payer: Self-pay | Admitting: Physician Assistant

## 2018-02-12 ENCOUNTER — Ambulatory Visit (INDEPENDENT_AMBULATORY_CARE_PROVIDER_SITE_OTHER): Payer: Medicare HMO | Admitting: Physician Assistant

## 2018-02-12 ENCOUNTER — Other Ambulatory Visit: Payer: Self-pay

## 2018-02-12 VITALS — BP 137/74 | HR 88 | Temp 98.4°F | Resp 18 | Ht 63.19 in | Wt 200.6 lb

## 2018-02-12 DIAGNOSIS — R238 Other skin changes: Secondary | ICD-10-CM

## 2018-02-12 DIAGNOSIS — W57XXXA Bitten or stung by nonvenomous insect and other nonvenomous arthropods, initial encounter: Secondary | ICD-10-CM | POA: Diagnosis not present

## 2018-02-12 MED ORDER — DOXYCYCLINE HYCLATE 100 MG PO CAPS: 200.0000 mg | ORAL_CAPSULE | Freq: Once | ORAL | 0 refills | Status: DC

## 2018-02-12 MED ORDER — DOXYCYCLINE HYCLATE 100 MG PO CAPS: 100.0000 mg | ORAL_CAPSULE | Freq: Two times a day (BID) | ORAL | 0 refills | Status: AC

## 2018-02-12 NOTE — Patient Instructions (Signed)
While taking Doxycycline:  -Do not drink milk or take iron supplements, multivitamins, calcium supplements, antacids, laxatives within 2 hours before or after taking doxycycline. -Avoid direct exposure to sunlight or tanning beds. Doxycycline can make you sunburn more easily. Wear protective clothing and use sunscreen (SPF 30 or higher) when you are outdoors. -Antibiotic medicines can cause diarrhea, which may be a sign of a new infection. If you have diarrhea that is watery or bloody, stop taking this medicine and seek medical care.    Tick Bite Information, Adult Ticks are insects that can bite. Most ticks live in shrubs and grassy areas. They climb onto people and animals that go by. Then they bite. Some ticks carry germs that can make you sick. How can I prevent tick bites?  Use an insect repellent that has 20% or higher of the ingredients DEET, picaridin, or IR3535. Put this insect repellent on: ? Bare skin. ? The tops of your boots. ? Your pant legs. ? The ends of your sleeves.  If you use an insect repellent that has the ingredient permethrin, make sure to follow the instructions on the bottle. Treat the following: ? Clothing. ? Supplies. ? Boots. ? Tents.  Wear long sleeves, long pants, and light colors.  Tuck your pant legs into your socks.  Stay in the middle of the trail.  Try not to walk through long grass.  Before going inside your house, check your clothes, hair, and skin for ticks. Make sure to check your head, neck, armpits, waist, groin, and joint areas.  Check for ticks every day.  When you come indoors: ? Wash your clothes right away. ? Shower right away. ? Dry your clothes in a dryer on high heat for 60 minutes or more. What is the right way to remove a tick? Remove a tick from your skin as soon as possible.  To remove a tick that is crawling on your skin: ? Go outdoors and brush the tick off. ? Use tape or a lint roller.  To remove a tick that is  biting: ? Wash your hands. ? If you have latex gloves, put them on. ? Use tweezers, curved forceps, or a tick-removal tool to grasp the tick. Grasp the tick as close to your skin and as close to the tick's head as possible. ? Gently pull up until the tick lets go.  Try to keep the tick's head attached to its body.  Do not twist or jerk the tick.  Do not squeeze or crush the tick.  Do not try to remove a tick with heat, alcohol, petroleum jelly, or fingernail polish. How should I get rid of a tick? Here are some ways to get rid of a tick that is alive:  Place the tick in rubbing alcohol.  Place the tick in a bag or container you can close tightly.  Wrap the tick tightly in tape.  Flush the tick down the toilet.  Contact a doctor if:  You have symptoms of a disease, such as: ? Pain in a muscle, joint, or bone. ? Trouble walking or moving your legs. ? Numbness in your legs. ? Inability to move (paralysis). ? A red rash that makes a circle (bull's-eye rash). ? Redness and swelling where the tick bit you. ? A fever. ? Throwing up (vomiting) over and over. ? Diarrhea. ? Weight loss. ? Tender and swollen lymph glands. ? Shortness of breath. ? Cough. ? Belly pain (abdominal pain). ? Headache. ? Being  more tired than normal. ? A change in how alert (conscious) you are. ? Confusion. Get help right away if:  You cannot remove a tick.  A part of a tick breaks off and gets stuck in your skin.  You are feeling worse. Summary  Ticks may carry germs that can make you sick.  To prevent tick bites, wear long sleeves, long pants, and light colors. Use insect repellent. Follow the instructions on the bottle.  If the tick is biting, do not try to remove it with heat, alcohol, petroleum jelly, or fingernail polish.  Use tweezers, curved forceps, or a tick-removal tool to grasp the tick. Gently pull up until the tick lets go. Do not twist or jerk the tick. Do not squeeze or crush  the tick.  If you have symptoms, contact a doctor. This information is not intended to replace advice given to you by your health care provider. Make sure you discuss any questions you have with your health care provider. Document Released: 12/31/2009 Document Revised: 01/16/2017 Document Reviewed: 01/16/2017 Elsevier Interactive Patient Education  2018 [employer].

## 2018-02-12 NOTE — Progress Notes (Addendum)
Bridget Butler  MRN: [id number] DOB: 1946/07/15  Subjective:  Bridget Butler is a 72 y.o. female seen in office today for a chief complaint of tick bite yesterday.  Patient notes she had some discomfort on her posterior trunk and her son looked.  There was a tick embedded into her skin.  He pulled it off.  She does not know how long it was on there fore.  She has been out gardening recently.  Denies rash, fever, chills, headache, joint pains, and fatigue. Pt brought tick in with her. She was concerned because her daughter has Lyme disease.   Review of Systems  Per HPI  Patient Active Problem List   Diagnosis Date Noted  . Insulin dependent type 2 diabetes mellitus, uncontrolled ([place]) 01/31/2018  . Essential hypertension 01/31/2018  . Hyperlipidemia LDL goal <100 01/31/2018  . Gastroesophageal reflux disease 01/31/2018    Current Outpatient Medications on File Prior to Visit  Medication Sig Dispense Refill  . ACCU-CHEK SMARTVIEW test strip     . aspirin EC 81 MG tablet Take 81 mg by mouth daily.    . Dulaglutide (TRULICITY) 8.12 XN/1.7GY SOPN Inject 0.5 mLs into the skin once a week. 4 pen 0  . ezetimibe (ZETIA) 10 MG tablet Take 10 mg by mouth daily.    . Insulin Glargine (LANTUS SOLOSTAR) 100 UNIT/ML Solostar Pen Inject 20 Units into the skin every morning. 15 mL 0  . lisinopril-hydrochlorothiazide (PRINZIDE,ZESTORETIC) 20-12.5 MG tablet Take 2 tablets by mouth daily. 180 tablet 0  . Multiple Vitamin (MULTIVITAMIN WITH MINERALS) TABS Take 1 tablet by mouth daily.    . Omega-3 Fatty Acids (FISH OIL) 1000 MG CAPS Take 1 capsule by mouth daily.    [name] Kitchen omeprazole (PRILOSEC) 40 MG capsule Take 40 mg by mouth daily.    . ranitidine (ZANTAC) 150 MG tablet Take 1 tablet (150 mg total) by mouth 2 (two) times daily. 180 tablet 1  . [DISCONTINUED] sitaGLIPtin-metformin (JANUMET) 50-1000 MG tablet Take 1 tablet by mouth daily with lunch.     No current facility-administered medications on file  prior to visit.     No Known Allergies   Objective:  BP 137/74 (BP Location: Left Arm, Patient Position: Sitting, Cuff Size: Normal)   Pulse 88   Temp 98.4 F (36.9 C) (Oral)   Resp 18   Ht 5' 3.19" (1.605 m)   Wt 200 lb 9.6 oz (91 kg)   SpO2 99%   BMI 35.32 kg/m   Physical Exam  Constitutional: She is oriented to person, place, and time. She appears well-developed and well-nourished.  HENT:  Head: Normocephalic and atraumatic.  Eyes: Conjunctivae are normal.  Neck: Normal range of motion.  Pulmonary/Chest: Effort normal.  Neurological: She is alert and oriented to person, place, and time.  Skin: Skin is warm and dry. No rash noted.  Psychiatric: She has a normal mood and affect.  Vitals reviewed.  Tick is small, dark brown, with light white spot noted on posterior shield. Head is intact. Tick is not engorged.  Assessment and Plan :  1. Skin irritation 2. Tick bite, initial encounter Pt is asx. Although tick appearance is suggestive of lone star species (Amblyomma americanum), will treat prophylactically with doxycycline for potential tick borne illness. Initially a single dose was prescribed but after further consideration, a 7 day course of doxycyline 100mg  BID was prescribed. Pt was contacted via phone multiple times with this update. She did not answer. Voicemail was left educating  her on this update.  Advised to return to clinic if symptoms worsen, do not improve, or as needed.  - doxycycline (VIBRAMYCIN) 100 MG capsule; Take 2 capsules (200 mg total) by mouth once for 1 dose.  Dispense: 2 capsule; Refill: 0  Tenna Delaine PA-C  Primary Care at [facility] 02/12/2018 4:10 PM

## 2018-02-18 ENCOUNTER — Encounter: Payer: Self-pay | Admitting: *Deleted

## 2018-02-19 ENCOUNTER — Telehealth: Payer: Self-pay | Admitting: Physician Assistant

## 2018-02-19 NOTE — Telephone Encounter (Signed)
Initially a single dose was prescribed but after further consideration, a 7 day course of doxycyline 100mg  BID was prescribed. Pt was contacted via phone multiple times with this update. She did not answer. Voicemail was left educating her on this update.

## 2018-03-29 ENCOUNTER — Ambulatory Visit (INDEPENDENT_AMBULATORY_CARE_PROVIDER_SITE_OTHER): Payer: Medicare HMO | Admitting: Family Medicine

## 2018-03-29 ENCOUNTER — Other Ambulatory Visit: Payer: Self-pay

## 2018-03-29 ENCOUNTER — Encounter: Payer: Self-pay | Admitting: Family Medicine

## 2018-03-29 VITALS — BP 140/68 | HR 92 | Temp 98.2°F | Resp 16 | Ht 63.19 in | Wt 199.6 lb

## 2018-03-29 DIAGNOSIS — M8589 Other specified disorders of bone density and structure, multiple sites: Secondary | ICD-10-CM | POA: Diagnosis not present

## 2018-03-29 DIAGNOSIS — I10 Essential (primary) hypertension: Secondary | ICD-10-CM | POA: Diagnosis not present

## 2018-03-29 DIAGNOSIS — E1165 Type 2 diabetes mellitus with hyperglycemia: Secondary | ICD-10-CM

## 2018-03-29 DIAGNOSIS — Z113 Encounter for screening for infections with a predominantly sexual mode of transmission: Secondary | ICD-10-CM | POA: Diagnosis not present

## 2018-03-29 DIAGNOSIS — Z1159 Encounter for screening for other viral diseases: Secondary | ICD-10-CM

## 2018-03-29 DIAGNOSIS — Z5181 Encounter for therapeutic drug level monitoring: Secondary | ICD-10-CM

## 2018-03-29 DIAGNOSIS — Z794 Long term (current) use of insulin: Secondary | ICD-10-CM

## 2018-03-29 DIAGNOSIS — IMO0002 Reserved for concepts with insufficient information to code with codable children: Secondary | ICD-10-CM

## 2018-03-29 DIAGNOSIS — R7989 Other specified abnormal findings of blood chemistry: Secondary | ICD-10-CM | POA: Diagnosis not present

## 2018-03-29 DIAGNOSIS — E785 Hyperlipidemia, unspecified: Secondary | ICD-10-CM | POA: Diagnosis not present

## 2018-03-29 DIAGNOSIS — Z6835 Body mass index (BMI) 35.0-35.9, adult: Secondary | ICD-10-CM | POA: Diagnosis not present

## 2018-03-29 LAB — POCT URINALYSIS DIP (MANUAL ENTRY)
BILIRUBIN UA: NEGATIVE
Ketones, POC UA: NEGATIVE mg/dL
Leukocytes, UA: NEGATIVE
NITRITE UA: NEGATIVE
Protein Ur, POC: NEGATIVE mg/dL
RBC UA: NEGATIVE
Spec Grav, UA: 1.015 (ref 1.010–1.025)
Urobilinogen, UA: 0.2 E.U./dL
pH, UA: 6 (ref 5.0–8.0)

## 2018-03-29 LAB — POCT GLYCOSYLATED HEMOGLOBIN (HGB A1C): HEMOGLOBIN A1C: 6.7 % — AB (ref 4.0–5.6)

## 2018-03-29 NOTE — Patient Instructions (Addendum)
We recommend that you schedule a mammogram for breast cancer screening. Typically, you do not need a referral to do this. Please contact a local imaging center to schedule your mammogram.  [facility] - [phone]  *ask for the Radiology Department [facility]) - [phone] or [phone]  [facility] - [phone] [facility] - [phone] [facility] - [phone]  *ask for the Radiology Department [facility] - [phone]  *ask for the Radiology Department [facility] - [phone]  *ask for the Mammography Department [facility] - [phone]     IF you received an x-ray today, you will receive an invoice from [facility]. Please contact [facility] at [phone] with questions or concerns regarding your invoice.   IF you received labwork today, you will receive an invoice from [facility]. Please contact [facility] at [phone] with questions or concerns regarding your invoice.   Our billing staff will not be able to assist you with questions regarding bills from these companies.  You will be contacted with the lab results as soon as they are available. The fastest way to get your results is to activate your My Chart account. Instructions are located on the last page of this paperwork. If you have not heard from us regarding the results in 2 weeks, please contact this office.      

## 2018-03-29 NOTE — Progress Notes (Addendum)
Subjective:  By signing my name below, I, Essence Howell, attest that this documentation has been prepared under the direction and in the presence of Delman Cheadle, MD Electronically Signed: Ladene Artist, ED Scribe 03/29/2018 at 10:10 AM.   Patient ID: Bridget Butler, female    DOB: 1945-12-03, 72 y.o.   MRN: [id number]  Chief Complaint  Patient presents with  . Diabetes    TIIDM follow up    HPI Bridget Butler is a 72 y.o. female who presents to Primary Care at [facility] for f/u on her chronic medical conditions.  T2 DM Pt does Trulicity injections on Sunday morning. Still taking 1 Janumet tab qd as well. Denies any side-effects, lightheadedness, dizziness, hypoglycemic lows. She does reports readings in the 60s which occur about twice/month, usually in the middle of the night or while doing yard work. States she feels "nervous" and hungry during those times which resolve after eating.  HTN Pt does not check her BP outside of the office. She does have a BP monitor at home. States she has been taking a dose of Lisinopril-HCTZ at night with increased urinary frequency. Reports that she misunderstood prescription.  GERD Pt states that Zantac did not help so she switched back to Prilosec which seems to control her symptoms more.  Hyperlipidemia Pt is no longer taking fish oil. She has not tried CoQ10.  She is not fasting today  OTC Vitamins/Supplements She is taking a multivitamin with Vit D, not taking a calcium supplement. She is drinking 2% milk on cereal almost daily. Not drinking orange juice, eating yogurt or cheese daily.  Past Medical History:  Diagnosis Date  . Cataract   . Diabetes mellitus without complication ([place])   . Thyroid disease    Current Outpatient Medications on File Prior to Visit  Medication Sig Dispense Refill  . ACCU-CHEK SMARTVIEW test strip     . aspirin EC 81 MG tablet Take 81 mg by mouth daily.    . Dulaglutide (TRULICITY) 9.93 TT/0.1XB SOPN Inject 0.5 mLs  into the skin once a week. 4 pen 0  . ezetimibe (ZETIA) 10 MG tablet Take 10 mg by mouth daily.    . Insulin Glargine (LANTUS SOLOSTAR) 100 UNIT/ML Solostar Pen Inject 20 Units into the skin every morning. 15 mL 0  . lisinopril-hydrochlorothiazide (PRINZIDE,ZESTORETIC) 20-12.5 MG tablet Take 2 tablets by mouth daily. 180 tablet 0  . Multiple Vitamin (MULTIVITAMIN WITH MINERALS) TABS Take 1 tablet by mouth daily.    . Omega-3 Fatty Acids (FISH OIL) 1000 MG CAPS Take 1 capsule by mouth daily.    [name] Kitchen omeprazole (PRILOSEC) 40 MG capsule Take 40 mg by mouth daily.    . ranitidine (ZANTAC) 150 MG tablet Take 1 tablet (150 mg total) by mouth 2 (two) times daily. 180 tablet 1  . [DISCONTINUED] sitaGLIPtin-metformin (JANUMET) 50-1000 MG tablet Take 1 tablet by mouth daily with lunch.     No current facility-administered medications on file prior to visit.    Past Surgical History:  Procedure Laterality Date  . COSMETIC SURGERY    . GALLBLADDER SURGERY    . TUBAL LIGATION     No Known Allergies Family History  Problem Relation Age of Onset  . Heart disease Mother   . Hypertension Mother   . Cancer Sister   . Diabetes Sister    Social History   Socioeconomic History  . Marital status: Married    Spouse name: Not on file  . Number of children:  3  . Years of education: Not on file  . Highest education level: Not on file  Occupational History  . Not on file  Social Needs  . Financial resource strain: Not on file  . Food insecurity:    Worry: Not on file    Inability: Not on file  . Transportation needs:    Medical: Not on file    Non-medical: Not on file  Tobacco Use  . Smoking status: Never Smoker  . Smokeless tobacco: Never Used  Substance and Sexual Activity  . Alcohol use: No    Frequency: Never  . Drug use: No  . Sexual activity: Not on file  Lifestyle  . Physical activity:    Days per week: Not on file    Minutes per session: Not on file  . Stress: Not on file    Relationships  . Social connections:    Talks on phone: Not on file    Gets together: Not on file    Attends religious service: Not on file    Active member of club or organization: Not on file    Attends meetings of clubs or organizations: Not on file    Relationship status: Not on file  Other Topics Concern  . Not on file  Social History Narrative  . Not on file   Depression screen [facility] 2/9 03/29/2018 02/12/2018 12/29/2017  Decreased Interest 0 0 0  Down, Depressed, Hopeless 0 0 0  PHQ - 2 Score 0 0 0     Review of Systems  Neurological: Negative for dizziness and light-headedness.      Objective:   Physical Exam  Constitutional: She is oriented to person, place, and time. She appears well-developed and well-nourished. No distress.  HENT:  Head: Normocephalic and atraumatic.  Eyes: Conjunctivae and EOM are normal.  Neck: Neck supple. No tracheal deviation present.  Cardiovascular: Normal rate, regular rhythm and normal heart sounds.  Pulmonary/Chest: Effort normal and breath sounds normal. No respiratory distress.  Musculoskeletal: Normal range of motion.  Neurological: She is alert and oriented to person, place, and time.  Skin: Skin is warm and dry.  Psychiatric: She has a normal mood and affect. Her behavior is normal.  Nursing note and vitals reviewed.  BP 140/68   Pulse 92   Temp 98.2 F (36.8 C)   Resp 16   Ht 5' 3.19" (1.605 m)   Wt 199 lb 9.6 oz (90.5 kg)   SpO2 98%   BMI 35.15 kg/m     Results for orders placed or performed in visit on 03/29/18  POCT urinalysis dipstick  Result Value Ref Range   Color, UA yellow yellow   Clarity, UA clear clear   Glucose, UA =100 (A) negative mg/dL   Bilirubin, UA negative negative   Ketones, POC UA negative negative mg/dL   Spec Grav, UA 1.015 1.010 - 1.025   Blood, UA negative negative   pH, UA 6.0 5.0 - 8.0   Protein Ur, POC negative negative mg/dL   Urobilinogen, UA 0.2 0.2 or 1.0 E.U./dL   Nitrite, UA  Negative Negative   Leukocytes, UA Negative Negative  POCT glycosylated hemoglobin (Hb A1C)  Result Value Ref Range   Hemoglobin A1C 6.7 (A) 4.0 - 5.6 %   HbA1c, POC (prediabetic range)  5.7 - 6.4 %   HbA1c, POC (controlled diabetic range)  0.0 - 7.0 %   Assessment & Plan:   1. Insulin dependent type 2 diabetes mellitus, uncontrolled ([place])  2. Hyperlipidemia LDL goal <100 -maxed out on zetia, taking fish oil already; intolerant of Crestor and lipitor. Lipids 3 mos prior uncontrolled w/ LDL 133 and non-HDL 158 - try pitavastatin 1mg  - written for qd so can get quantity w/ copay but even ok to start off taking 3x/wk and can cut into 1/2 tab if prefers. Check vit d w/ next labs as if replaced may cramp less, already taking coenzyme Q10  3. Essential hypertension   4. Class 2 severe obesity due to excess calories with serious comorbidity and body mass index (BMI) of 35.0 to 35.9 in adult ([place])   5. Medication monitoring encounter   6. Routine screening for STI (sexually transmitted infection)   7. Need for hepatitis C screening test   8. Osteopenia of multiple sites - update DEXA scan - last done 12/2015 w/ T score -2.3 Rt femur at [facility] - pt sched DEXA for 04/19/18.  Check vitamin D w/ next labs - none in chart   ADDENDUM: TSH suppressed. Pt asked to RTC for repeat OV to assess - needs repeat TSH w/ FT4. Also check vit D then. DEXA 04/19/18 [facility] unchanged T score Rt femur - abstracted to problem list  Orders Placed This Encounter  Procedures  . Microalbumin/Creatinine Ratio, Urine  . HCV Ab w/Rflx to Verification  . Comprehensive metabolic panel  . TSH  . CBC with Differential/Platelet  . Interpretation:  . POCT urinalysis dipstick  . POCT glycosylated hemoglobin (Hb A1C)    Meds ordered this encounter  Medications  . Pitavastatin Calcium 1 MG TABS    Sig: Take 1 tablet (1 mg total) by mouth daily.    Dispense:  90 tablet    Refill:  1    I personally performed the services  described in this documentation, which was scribed in my presence. The recorded information has been reviewed and considered, and addended by me as needed.   Delman Cheadle, M.D.  Primary Care at [facility] [address] [place], [place] [address] [phone] phone [phone] fax  04/23/18 9:25 PM

## 2018-03-30 LAB — CBC WITH DIFFERENTIAL/PLATELET
BASOS ABS: 0 10*3/uL (ref 0.0–0.2)
Basos: 0 %
EOS (ABSOLUTE): 0.1 10*3/uL (ref 0.0–0.4)
Eos: 1 %
Hematocrit: 33.8 % — ABNORMAL LOW (ref 34.0–46.6)
Hemoglobin: 11.2 g/dL (ref 11.1–15.9)
Immature Grans (Abs): 0 10*3/uL (ref 0.0–0.1)
Immature Granulocytes: 0 %
LYMPHS ABS: 2.4 10*3/uL (ref 0.7–3.1)
Lymphs: 42 %
MCH: 30.6 pg (ref 26.6–33.0)
MCHC: 33.1 g/dL (ref 31.5–35.7)
MCV: 92 fL (ref 79–97)
MONOS ABS: 0.4 10*3/uL (ref 0.1–0.9)
Monocytes: 7 %
NEUTROS ABS: 2.8 10*3/uL (ref 1.4–7.0)
Neutrophils: 50 %
Platelets: 305 10*3/uL (ref 150–450)
RBC: 3.66 x10E6/uL — AB (ref 3.77–5.28)
RDW: 14.5 % (ref 12.3–15.4)
WBC: 5.8 10*3/uL (ref 3.4–10.8)

## 2018-03-30 LAB — COMPREHENSIVE METABOLIC PANEL
A/G RATIO: 1.4 (ref 1.2–2.2)
ALBUMIN: 4.3 g/dL (ref 3.5–4.8)
ALK PHOS: 63 IU/L (ref 39–117)
ALT: 14 IU/L (ref 0–32)
AST: 15 IU/L (ref 0–40)
BILIRUBIN TOTAL: 0.4 mg/dL (ref 0.0–1.2)
BUN / CREAT RATIO: 17 (ref 12–28)
BUN: 17 mg/dL (ref 8–27)
CHLORIDE: 98 mmol/L (ref 96–106)
CO2: 24 mmol/L (ref 20–29)
Calcium: 9.5 mg/dL (ref 8.7–10.3)
Creatinine, Ser: 0.99 mg/dL (ref 0.57–1.00)
GFR calc Af Amer: 66 mL/min/{1.73_m2} (ref >59)
GFR calc non Af Amer: 58 mL/min/{1.73_m2} — ABNORMAL LOW (ref >59)
GLOBULIN, TOTAL: 3.1 g/dL (ref 1.5–4.5)
Glucose: 160 mg/dL — ABNORMAL HIGH (ref 65–99)
POTASSIUM: 4.3 mmol/L (ref 3.5–5.2)
SODIUM: 138 mmol/L (ref 134–144)
Total Protein: 7.4 g/dL (ref 6.0–8.5)

## 2018-03-30 LAB — MICROALBUMIN / CREATININE URINE RATIO
CREATININE, UR: 65.4 mg/dL
MICROALB/CREAT RATIO: 14.1 mg/g{creat} (ref 0.0–30.0)
MICROALBUM., U, RANDOM: 9.2 ug/mL

## 2018-03-30 LAB — HCV AB W/RFLX TO VERIFICATION: HCV Ab: <0.1 s/co ratio (ref 0.0–0.9)

## 2018-03-30 LAB — HCV INTERPRETATION

## 2018-03-30 LAB — TSH: TSH: 0.118 u[IU]/mL — AB (ref 0.450–4.500)

## 2018-04-19 DIAGNOSIS — Z1231 Encounter for screening mammogram for malignant neoplasm of breast: Secondary | ICD-10-CM | POA: Diagnosis not present

## 2018-04-19 DIAGNOSIS — Z803 Family history of malignant neoplasm of breast: Secondary | ICD-10-CM | POA: Diagnosis not present

## 2018-04-19 DIAGNOSIS — M8589 Other specified disorders of bone density and structure, multiple sites: Secondary | ICD-10-CM | POA: Diagnosis not present

## 2018-04-19 LAB — HM MAMMOGRAPHY

## 2018-04-19 LAB — HM DEXA SCAN

## 2018-04-23 DIAGNOSIS — R7989 Other specified abnormal findings of blood chemistry: Secondary | ICD-10-CM | POA: Insufficient documentation

## 2018-04-23 MED ORDER — PITAVASTATIN CALCIUM 1 MG PO TABS: 1.0000 mg | ORAL_TABLET | Freq: Every day | ORAL | 1 refills | Status: DC

## 2018-05-05 ENCOUNTER — Encounter: Payer: Self-pay | Admitting: *Deleted

## 2018-05-20 ENCOUNTER — Telehealth: Payer: Self-pay

## 2018-05-20 MED ORDER — DULAGLUTIDE 0.75 MG/0.5ML ~~LOC~~ SOAJ: 0.5000 mL | SUBCUTANEOUS | 3 refills | Status: DC

## 2018-05-20 NOTE — Telephone Encounter (Signed)
Copied from [place] [id number]. Topic: General - Other >> May 20, 2018  9:09 AM Oneta Rack wrote: Relation to pt: self  Call back [phone] Pharmacy: [place], [place] [phone] (Phone) [phone] (Fax)   Reason for call:  Patient requesting 30 day supply of Dulaglutide (TRULICITY) 7.94 TN/7.1KE SOPN. Patient states insurance advised she's currently in the donut hole therefore 30 day supply would be cheaper then 90 day supply, advised patient please allow 48 to 72 hour turn around time, please advise

## 2018-06-17 ENCOUNTER — Telehealth: Payer: Self-pay | Admitting: Family Medicine

## 2018-06-17 NOTE — Telephone Encounter (Signed)
Will route request to office; last office visit 03/29/18 with Dr Delman Cheadle; please see below note.

## 2018-06-17 NOTE — Telephone Encounter (Signed)
Copied from [place] [id number]. Topic: Quick Communication - Rx Refill/Question >> Jun 17, 2018 12:30 PM Neva Seat wrote: Bridget Butler 50/1000 mg [facility] True Metrix Test Strips True Plus 33 gauge lancets  Needing Rx's to be filled  [place], [place] [address] Phone: [phone] Fax: [phone]

## 2018-06-24 ENCOUNTER — Telehealth: Payer: Self-pay | Admitting: Family Medicine

## 2018-06-24 NOTE — Telephone Encounter (Signed)
Left a VM in regards to her appt she has with Dr. Brigitte Pulse on 07/01/2018. The provider is taking a leave of absence for the month of Sept. She can be rescheduled with a different provider or reschedule with Dr. Brigitte Pulse in Oct.

## 2018-06-25 NOTE — Telephone Encounter (Signed)
Patient checking status, CB [phone]

## 2018-07-01 ENCOUNTER — Ambulatory Visit: Payer: Medicare HMO | Admitting: Family Medicine

## 2018-07-07 NOTE — Progress Notes (Signed)
Bridget Butler  MRN: [id number] DOB: 04/27/1946  Subjective:  Bridget Butler is a 72 y.o. female seen in office today for a chief complaint of medication refills. She is out of janumet and zetia.    T2DM Takes Janumet daily, Lantus 20 units in a.m., and Trulicity once weekly. Worried about cost of trulicity because she just reached donut hole.  Has been out of Janumet for 3 weeks.  Denies any side-effects, lightheadedness, dizziness, hypoglycemic lows. Sugars running around 120-170.  Reports better control when Janumet was on board.  HTN Takes lisinopril-hctz 20-12.5mg  . Pt does not check her BP outside of the office. She does have a BP monitor at home.   Hyperlipidemia Taking zetia 10mg  daily, has been out of it for a week. Could not tolerate statins due to muscle aches. Was sent in Rx for pitivastatin last visit with Dr. Brigitte Pulse but pt did not pick up.   Low TSH Treated years ago for "thyroid problem." Thinks it was high but cannot remember. Took pills for a couple months but stopped. Never followed up with endocrinology.   Was taking calcium pills but it was constipating. Not taking daily vit d.   Review of Systems  Constitutional: Negative for chills, diaphoresis and fever.  Respiratory: Negative for cough and shortness of breath.   Neurological: Negative for dizziness and light-headedness.    Patient Active Problem List   Diagnosis Date Noted  . Abnormal thyroid stimulating hormone (TSH) level 04/23/2018  . Class 2 severe obesity due to excess calories with serious comorbidity and body mass index (BMI) of 35.0 to 35.9 in adult ([place]) 03/29/2018  . Insulin dependent type 2 diabetes mellitus, uncontrolled ([place]) 01/31/2018  . Essential hypertension 01/31/2018  . Hyperlipidemia LDL goal <100 01/31/2018  . Gastroesophageal reflux disease 01/31/2018  . Osteopenia of multiple sites 12/21/2015    Current Outpatient Medications on File Prior to Visit  Medication Sig Dispense Refill    . ACCU-CHEK SMARTVIEW test strip     . aspirin EC 81 MG tablet Take 81 mg by mouth daily.    . Dulaglutide (TRULICITY) 6.76 PP/5.0DT SOPN Inject 0.5 mLs into the skin once a week. 4 pen 3  . ezetimibe (ZETIA) 10 MG tablet Take 10 mg by mouth daily.    . Insulin Glargine (LANTUS SOLOSTAR) 100 UNIT/ML Solostar Pen Inject 20 Units into the skin every morning. 15 mL 0  . lisinopril-hydrochlorothiazide (PRINZIDE,ZESTORETIC) 20-12.5 MG tablet Take 2 tablets by mouth daily. 180 tablet 0  . Multiple Vitamin (MULTIVITAMIN WITH MINERALS) TABS Take 1 tablet by mouth daily.    [name] Kitchen omeprazole (PRILOSEC) 40 MG capsule Take 40 mg by mouth daily.    . Omega-3 Fatty Acids (FISH OIL) 1000 MG CAPS Take 1 capsule by mouth daily.    . Pitavastatin Calcium 1 MG TABS Take 1 tablet (1 mg total) by mouth daily. (Patient not taking: Reported on 07/08/2018) 90 tablet 1  . ranitidine (ZANTAC) 150 MG tablet Take 1 tablet (150 mg total) by mouth 2 (two) times daily. (Patient not taking: Reported on 07/08/2018) 180 tablet 1  . [DISCONTINUED] sitaGLIPtin-metformin (JANUMET) 50-1000 MG tablet Take 1 tablet by mouth daily with lunch.     No current facility-administered medications on file prior to visit.     No Known Allergies    Social History   Socioeconomic History  . Marital status: Married    Spouse name: Not on file  . Number of children: 3  . Years of  education: Not on file  . Highest education level: Not on file  Occupational History  . Not on file  Social Needs  . Financial resource strain: Not on file  . Food insecurity:    Worry: Not on file    Inability: Not on file  . Transportation needs:    Medical: Not on file    Non-medical: Not on file  Tobacco Use  . Smoking status: Never Smoker  . Smokeless tobacco: Never Used  Substance and Sexual Activity  . Alcohol use: No    Frequency: Never  . Drug use: No  . Sexual activity: Not on file  Lifestyle  . Physical activity:    Days per week: Not on  file    Minutes per session: Not on file  . Stress: Not on file  Relationships  . Social connections:    Talks on phone: Not on file    Gets together: Not on file    Attends religious service: Not on file    Active member of club or organization: Not on file    Attends meetings of clubs or organizations: Not on file    Relationship status: Not on file  . Intimate partner violence:    Fear of current or ex partner: Not on file    Emotionally abused: Not on file    Physically abused: Not on file    Forced sexual activity: Not on file  Other Topics Concern  . Not on file  Social History Narrative  . Not on file    Objective:  BP 138/60   Pulse (!) 103   Temp 99 F (37.2 C) (Oral)   Resp 18   Ht 5' 3.19" (1.605 m)   Wt 190 lb 9.6 oz (86.5 kg)   SpO2 99%   BMI 33.56 kg/m   Physical Exam  Constitutional: She is oriented to person, place, and time. She appears well-developed and well-nourished. No distress.  HENT:  Head: Normocephalic and atraumatic.  Mouth/Throat: Uvula is midline, oropharynx is clear and moist and mucous membranes are normal.  Eyes: Pupils are equal, round, and reactive to light. Conjunctivae and EOM are normal.  Neck: Normal range of motion.  Cardiovascular: Normal rate, regular rhythm, normal heart sounds and intact distal pulses.  Pulmonary/Chest: Effort normal and breath sounds normal. She has no wheezes. She has no rhonchi. She has no rales.  Musculoskeletal:       Right lower leg: She exhibits no swelling.       Left lower leg: She exhibits no swelling.  Neurological: She is alert and oriented to person, place, and time.  Skin: Skin is warm and dry.  Psychiatric: She has a normal mood and affect.  Vitals reviewed.    Wt Readings from Last 3 Encounters:  07/08/18 190 lb 9.6 oz (86.5 kg)  03/29/18 199 lb 9.6 oz (90.5 kg)  02/12/18 200 lb 9.6 oz (91 kg)    Assessment and Plan :  1. Insulin dependent type 2 diabetes mellitus, uncontrolled  ([place]) Discussed case with Dr. Brigitte Pulse.  Dr. Brigitte Pulse completed MAP program documents and gave to patient.  Continue with Trulicity and Janumet.  Stop Lantus.  Start Engineer, agricultural.  Follow-up in 3 months for reevaluation. - Dulaglutide (TRULICITY) 0.07 MA/2.6JF SOPN; Inject 0.5 mLs into the skin once a week.  Dispense: 4 pen; Refill: 3 - Hemoglobin A1c - sitaGLIPtin-metformin (JANUMET) 50-1000 MG tablet; Take 1 tablet by mouth daily.  Dispense: 90 tablet; Refill: 0 - Insulin Glargine (  BASAGLAR KWIKPEN) 100 UNIT/ML SOPN; Inject 0.2 mLs (20 Units total) into the skin daily.  Dispense: 30 mL; Refill: 2 - Insulin Pen Needle (PEN NEEDLES) 32G X 5 MM MISC; 1 Units by Does not apply route daily.  Dispense: 100 each; Refill: 5  2. Hyperlipidemia LDL goal <100 Refills provided. - Pitavastatin Calcium 1 MG TABS; Take 1 tablet (1 mg total) by mouth daily.  Dispense: 90 tablet; Refill: 1 - ezetimibe (ZETIA) 10 MG tablet; Take 1 tablet (10 mg total) by mouth daily.  Dispense: 90 tablet; Refill: 0  3. Estrogen deficiency - VITAMIN D 25 Hydroxy (Vit-D Deficiency, Fractures)  4. Low TSH level Labs pending.  If referral for endocrinology warranted, patient agrees to go. - TSH - T3, Free - T4, Free  5. Class 1 obesity due to excess calories with serious comorbidity and body mass index (BMI) of 33.0 to 33.9 in adult Congratulated on weight loss.   6. Flu vaccine need - Flu vaccine HIGH DOSE PF (Fluzone High dose)    Tenna Delaine PA-C  [facility] 07/08/2018 3:20 PM

## 2018-07-08 ENCOUNTER — Ambulatory Visit (INDEPENDENT_AMBULATORY_CARE_PROVIDER_SITE_OTHER): Payer: Medicare HMO | Admitting: Physician Assistant

## 2018-07-08 ENCOUNTER — Other Ambulatory Visit: Payer: Self-pay

## 2018-07-08 ENCOUNTER — Encounter: Payer: Self-pay | Admitting: Physician Assistant

## 2018-07-08 VITALS — BP 138/60 | HR 103 | Temp 99.0°F | Resp 18 | Ht 63.19 in | Wt 190.6 lb

## 2018-07-08 DIAGNOSIS — Z23 Encounter for immunization: Secondary | ICD-10-CM | POA: Diagnosis not present

## 2018-07-08 DIAGNOSIS — E668 Other obesity: Secondary | ICD-10-CM | POA: Diagnosis not present

## 2018-07-08 DIAGNOSIS — Z6833 Body mass index (BMI) 33.0-33.9, adult: Secondary | ICD-10-CM

## 2018-07-08 DIAGNOSIS — R7989 Other specified abnormal findings of blood chemistry: Secondary | ICD-10-CM

## 2018-07-08 DIAGNOSIS — E785 Hyperlipidemia, unspecified: Secondary | ICD-10-CM

## 2018-07-08 DIAGNOSIS — E6609 Other obesity due to excess calories: Secondary | ICD-10-CM

## 2018-07-08 DIAGNOSIS — E1165 Type 2 diabetes mellitus with hyperglycemia: Secondary | ICD-10-CM

## 2018-07-08 DIAGNOSIS — IMO0002 Reserved for concepts with insufficient information to code with codable children: Secondary | ICD-10-CM

## 2018-07-08 DIAGNOSIS — E2839 Other primary ovarian failure: Secondary | ICD-10-CM | POA: Diagnosis not present

## 2018-07-08 DIAGNOSIS — Z794 Long term (current) use of insulin: Secondary | ICD-10-CM | POA: Diagnosis not present

## 2018-07-08 MED ORDER — PEN NEEDLES 32G X 5 MM MISC: 1.0000 [IU] | Freq: Every day | 5 refills | Status: DC

## 2018-07-08 MED ORDER — PITAVASTATIN CALCIUM 1 MG PO TABS: 1.0000 mg | ORAL_TABLET | Freq: Every day | ORAL | 1 refills | Status: DC

## 2018-07-08 MED ORDER — DULAGLUTIDE 0.75 MG/0.5ML ~~LOC~~ SOAJ: 0.5000 mL | SUBCUTANEOUS | 3 refills | Status: DC

## 2018-07-08 MED ORDER — BASAGLAR KWIKPEN 100 UNIT/ML ~~LOC~~ SOPN: 20.0000 [IU] | PEN_INJECTOR | Freq: Every day | SUBCUTANEOUS | 2 refills | Status: DC

## 2018-07-08 MED ORDER — SITAGLIPTIN PHOS-METFORMIN HCL 50-1000 MG PO TABS: 1.0000 | ORAL_TABLET | Freq: Every day | ORAL | 0 refills | Status: DC

## 2018-07-08 MED ORDER — EZETIMIBE 10 MG PO TABS: 10.0000 mg | ORAL_TABLET | Freq: Every day | ORAL | 0 refills | Status: DC

## 2018-07-08 NOTE — Patient Instructions (Addendum)
Continue trulicity, insulin, and janumet for diabetes. Refills have been provided.   Follow up with Dr. Brigitte Pulse in 3 months.    If you have lab work done today you will be contacted with your lab results within the next 2 weeks.  If you have not heard from [place] then please contact us. The fastest way to get your results is to register for My Chart.   IF you received an x-ray today, you will receive an invoice from [facility]. Please contact [facility] at [phone] with questions or concerns regarding your invoice.   IF you received labwork today, you will receive an invoice from [facility]. Please contact [facility] at [phone] with questions or concerns regarding your invoice.   Our billing staff will not be able to assist you with questions regarding bills from these companies.  You will be contacted with the lab results as soon as they are available. The fastest way to get your results is to activate your My Chart account. Instructions are located on the last page of this paperwork. If you have not heard from [place] regarding the results in 2 weeks, please contact this office.

## 2018-07-09 LAB — T4, FREE: Free T4: 1.05 ng/dL (ref 0.82–1.77)

## 2018-07-09 LAB — TSH: TSH: 0.054 u[IU]/mL — AB (ref 0.450–4.500)

## 2018-07-09 LAB — HEMOGLOBIN A1C
Est. average glucose Bld gHb Est-mCnc: 154 mg/dL
Hgb A1c MFr Bld: 7 % — ABNORMAL HIGH (ref 4.8–5.6)

## 2018-07-09 LAB — VITAMIN D 25 HYDROXY (VIT D DEFICIENCY, FRACTURES): Vit D, 25-Hydroxy: 27.8 ng/mL — ABNORMAL LOW (ref 30.0–100.0)

## 2018-07-09 LAB — T3, FREE: T3 FREE: 3.3 pg/mL (ref 2.0–4.4)

## 2018-07-12 ENCOUNTER — Other Ambulatory Visit: Payer: Self-pay | Admitting: *Deleted

## 2018-07-12 DIAGNOSIS — IMO0002 Reserved for concepts with insufficient information to code with codable children: Secondary | ICD-10-CM

## 2018-07-12 DIAGNOSIS — E1165 Type 2 diabetes mellitus with hyperglycemia: Secondary | ICD-10-CM

## 2018-07-12 DIAGNOSIS — Z794 Long term (current) use of insulin: Principal | ICD-10-CM

## 2018-07-12 MED ORDER — TRUEPLUS LANCETS 33G MISC: 300.0000 | Freq: Two times a day (BID) | 1 refills | Status: DC

## 2018-07-12 MED ORDER — TRUE METRIX METER DEVI: 1.0000 | Freq: Two times a day (BID) | 0 refills | Status: DC

## 2018-07-12 MED ORDER — GLUCOSE BLOOD VI STRP: ORAL_STRIP | 1 refills | Status: DC

## 2018-07-12 NOTE — Telephone Encounter (Signed)
Patient is going to call Judson Roch at new facility

## 2018-07-14 DIAGNOSIS — E113593 Type 2 diabetes mellitus with proliferative diabetic retinopathy without macular edema, bilateral: Secondary | ICD-10-CM | POA: Diagnosis not present

## 2018-07-14 DIAGNOSIS — H43811 Vitreous degeneration, right eye: Secondary | ICD-10-CM | POA: Diagnosis not present

## 2018-07-14 LAB — HM DIABETES EYE EXAM

## 2018-07-23 ENCOUNTER — Other Ambulatory Visit: Payer: Self-pay | Admitting: Family Medicine

## 2018-07-23 ENCOUNTER — Telehealth: Payer: Self-pay | Admitting: Family Medicine

## 2018-07-23 NOTE — Telephone Encounter (Signed)
Copied from [place] [phone]. Topic: Quick Communication - See Telephone Encounter >> Jul 23, 2018  9:58 AM Rutherford Nail, NT wrote: CRM for notification. See Telephone encounter for: 07/23/18. Anderson Malta with [employer] and states that [place] prescribed a medication (livalo 1 MG) that is too expensive for the patient. Would like to know if a different medication could be sent to the pharmacy? [place], [address] [facility] [address]

## 2018-07-23 NOTE — Telephone Encounter (Signed)
Please advise. Dgaddy, CMA 

## 2018-07-26 ENCOUNTER — Other Ambulatory Visit: Payer: Self-pay | Admitting: Family Medicine

## 2018-07-26 NOTE — Telephone Encounter (Signed)
Copied from [place] [id number]. Topic: Quick Communication - Rx Refill/Question >> Jul 26, 2018  9:24 AM Charlynn Court wrote: Medication: Insulin Glargine (LANTUS SOLOSTAR) 100 UNIT/ML Solostar Pen   Has the patient contacted their pharmacy? Yes.   (Agent: If no, request that the patient contact the pharmacy for the refill.) (Agent: If yes, when and what did the pharmacy advise?)  Preferred Pharmacy (with phone number or street name): [place], [place]: Please be advised that RX refills may take up to 3 business days. We ask that you follow-up with your pharmacy.

## 2018-07-27 ENCOUNTER — Encounter: Payer: Self-pay | Admitting: *Deleted

## 2018-07-27 MED ORDER — INSULIN GLARGINE 100 UNIT/ML SOLOSTAR PEN: 20.0000 [IU] | PEN_INJECTOR | SUBCUTANEOUS | 0 refills | Status: DC

## 2018-07-27 NOTE — Telephone Encounter (Signed)
Forwarding to B Wise.

## 2018-07-27 NOTE — Telephone Encounter (Signed)
[name] with [facility] called to see if Bridget Butler would like to change the (Livalo 1mg ) to a statin. Said due to the cost patient can not afford to pay for it. She stated that patient is willing to try a Pravastatin or Rosuvastatin may be best and if you decide to give her this would she still have to remain on the Zetia. Request a call back. Please advise Ph# [phone]

## 2018-08-02 ENCOUNTER — Telehealth: Payer: Self-pay | Admitting: Physician Assistant

## 2018-08-02 NOTE — Telephone Encounter (Signed)
Called pt to make sure that she had picked up her RX. Pt states that she did receive her RX and is all set.

## 2018-08-03 MED ORDER — PRAVASTATIN SODIUM 20 MG PO TABS: 20.0000 mg | ORAL_TABLET | Freq: Every day | ORAL | 0 refills | Status: DC

## 2018-08-03 NOTE — Addendum Note (Signed)
Addended by: Tenna Delaine D on: 08/03/2018 07:07 AM   Modules accepted: Orders

## 2018-08-03 NOTE — Telephone Encounter (Signed)
Informed pt of new medication sent to pharmacy.

## 2018-08-03 NOTE — Telephone Encounter (Signed)
Please call pt. I have given her a prescription for low dose pravastatin. She can continue zetia while on this medication.

## 2018-08-04 ENCOUNTER — Telehealth: Payer: Self-pay

## 2018-08-04 NOTE — Telephone Encounter (Signed)
L/m for pt.  Medication has been received for patient.  Placed in [place] for pt pickup.

## 2018-08-05 ENCOUNTER — Telehealth: Payer: Self-pay | Admitting: *Deleted

## 2018-08-05 NOTE — Telephone Encounter (Signed)
Bridget Butler picked up all her diabetic medication on 08/05/2018

## 2018-08-30 ENCOUNTER — Other Ambulatory Visit: Payer: Self-pay | Admitting: Family Medicine

## 2018-08-31 NOTE — Telephone Encounter (Signed)
Requested Prescriptions  Pending Prescriptions Disp Refills  . lisinopril-hydrochlorothiazide (PRINZIDE,ZESTORETIC) 20-12.5 MG tablet [Pharmacy Med Name: LISINOPRIL/HYDROCHLOROTHIAZIDE 20-12.5 MG Tablet] 180 tablet 0    Sig: TAKE 2 TABLETS EVERY DAY  (NEW DOSE)     Cardiovascular:  ACEI + Diuretic Combos Passed - 08/30/2018  6:23 PM      Passed - Na in normal range and within 180 days    Sodium  Date Value Ref Range Status  03/29/2018 138 134 - 144 mmol/L Final         Passed - K in normal range and within 180 days    Potassium  Date Value Ref Range Status  03/29/2018 4.3 3.5 - 5.2 mmol/L Final         Passed - Cr in normal range and within 180 days    Creatinine, Ser  Date Value Ref Range Status  03/29/2018 0.99 0.57 - 1.00 mg/dL Final         Passed - Ca in normal range and within 180 days    Calcium  Date Value Ref Range Status  03/29/2018 9.5 8.7 - 10.3 mg/dL Final         Passed - Patient is not pregnant      Passed - Last BP in normal range    BP Readings from Last 1 Encounters:  07/08/18 138/60         Passed - Valid encounter within last 6 months    Recent Outpatient Visits          1 month ago Insulin dependent type 2 diabetes mellitus, uncontrolled ([place])   [facility], [place] D, PA-C   5 months ago Insulin dependent type 2 diabetes mellitus, uncontrolled ([place])   [facility], Laurey Arrow, MD   6 months ago Skin irritation   [facility] at [facility], [place] D, PA-C   8 months ago Insulin dependent type 2 diabetes mellitus, uncontrolled ([place])   [facility], Laurey Arrow, MD

## 2018-09-18 ENCOUNTER — Other Ambulatory Visit: Payer: Self-pay

## 2018-09-18 ENCOUNTER — Emergency Department (HOSPITAL_COMMUNITY): Payer: Medicare HMO

## 2018-09-18 ENCOUNTER — Emergency Department (HOSPITAL_COMMUNITY)
Admission: EM | Admit: 2018-09-18 | Discharge: 2018-09-18 | Disposition: A | Discharge status: Home / Self Care | Payer: Medicare HMO | Attending: Emergency Medicine | Admitting: Emergency Medicine

## 2018-09-18 ENCOUNTER — Encounter (HOSPITAL_COMMUNITY): Payer: Self-pay

## 2018-09-18 DIAGNOSIS — Z79899 Other long term (current) drug therapy: Secondary | ICD-10-CM | POA: Insufficient documentation

## 2018-09-18 DIAGNOSIS — R112 Nausea with vomiting, unspecified: Secondary | ICD-10-CM | POA: Insufficient documentation

## 2018-09-18 DIAGNOSIS — R748 Abnormal levels of other serum enzymes: Secondary | ICD-10-CM

## 2018-09-18 DIAGNOSIS — Z7982 Long term (current) use of aspirin: Secondary | ICD-10-CM | POA: Diagnosis not present

## 2018-09-18 DIAGNOSIS — R531 Weakness: Secondary | ICD-10-CM | POA: Insufficient documentation

## 2018-09-18 DIAGNOSIS — E119 Type 2 diabetes mellitus without complications: Secondary | ICD-10-CM | POA: Insufficient documentation

## 2018-09-18 DIAGNOSIS — I10 Essential (primary) hypertension: Secondary | ICD-10-CM | POA: Insufficient documentation

## 2018-09-18 DIAGNOSIS — Z794 Long term (current) use of insulin: Secondary | ICD-10-CM | POA: Insufficient documentation

## 2018-09-18 DIAGNOSIS — R000 Tachycardia, unspecified: Secondary | ICD-10-CM | POA: Insufficient documentation

## 2018-09-18 LAB — CBC
HCT: 34.5 % — ABNORMAL LOW (ref 36.0–46.0)
Hemoglobin: 10.9 g/dL — ABNORMAL LOW (ref 12.0–15.0)
MCH: 30 pg (ref 26.0–34.0)
MCHC: 31.6 g/dL (ref 30.0–36.0)
MCV: 95 fL (ref 80.0–100.0)
Platelets: 274 10*3/uL (ref 150–400)
RBC: 3.63 MIL/uL — ABNORMAL LOW (ref 3.87–5.11)
RDW: 13.3 % (ref 11.5–15.5)
WBC: 15.2 10*3/uL — ABNORMAL HIGH (ref 4.0–10.5)
nRBC: 0 % (ref 0.0–0.2)

## 2018-09-18 LAB — COMPREHENSIVE METABOLIC PANEL
ALK PHOS: 52 U/L (ref 38–126)
ALT: 15 U/L (ref 0–44)
AST: 17 U/L (ref 15–41)
Albumin: 3.9 g/dL (ref 3.5–5.0)
Anion gap: 12 (ref 5–15)
BILIRUBIN TOTAL: 0.7 mg/dL (ref 0.3–1.2)
BUN: 22 mg/dL (ref 8–23)
CO2: 26 mmol/L (ref 22–32)
CREATININE: 1.41 mg/dL — AB (ref 0.44–1.00)
Calcium: 9.7 mg/dL (ref 8.9–10.3)
Chloride: 96 mmol/L — ABNORMAL LOW (ref 98–111)
GFR, EST AFRICAN AMERICAN: 43 mL/min — AB (ref >60)
GFR, EST NON AFRICAN AMERICAN: 37 mL/min — AB (ref >60)
Glucose, Bld: 205 mg/dL — ABNORMAL HIGH (ref 70–99)
Potassium: 3.6 mmol/L (ref 3.5–5.1)
Sodium: 134 mmol/L — ABNORMAL LOW (ref 135–145)
TOTAL PROTEIN: 7.9 g/dL (ref 6.5–8.1)

## 2018-09-18 LAB — URINALYSIS, ROUTINE W REFLEX MICROSCOPIC
Bilirubin Urine: NEGATIVE
Glucose, UA: 150 mg/dL — AB
Hgb urine dipstick: NEGATIVE
Ketones, ur: NEGATIVE mg/dL
Leukocytes, UA: NEGATIVE
Nitrite: NEGATIVE
Protein, ur: NEGATIVE mg/dL
Specific Gravity, Urine: 1.012 (ref 1.005–1.030)
pH: 6 (ref 5.0–8.0)

## 2018-09-18 LAB — TROPONIN I: Troponin I: 0.03 ng/mL (ref <0.03)

## 2018-09-18 LAB — INFLUENZA PANEL BY PCR (TYPE A & B)
Influenza A By PCR: NEGATIVE
Influenza B By PCR: NEGATIVE

## 2018-09-18 MED ORDER — CEPHALEXIN 500 MG PO CAPS: 500.0000 mg | ORAL_CAPSULE | Freq: Two times a day (BID) | ORAL | 0 refills | Status: DC

## 2018-09-18 MED ORDER — ONDANSETRON 8 MG PO TBDP: 8.0000 mg | ORAL_TABLET | Freq: Three times a day (TID) | ORAL | 0 refills | Status: DC | PRN

## 2018-09-18 MED ORDER — SODIUM CHLORIDE 0.9 % IV BOLUS
1000.0000 mL | Freq: Once | INTRAVENOUS | Status: AC
  Administered 2018-09-18: 1000 mL via INTRAVENOUS

## 2018-09-18 NOTE — ED Notes (Addendum)
Patient aware that she needs to urinate, ambulated to bathroom with steady gait. States that she is "unable to go" Patient refusing catheter at this time

## 2018-09-18 NOTE — ED Notes (Signed)
Date and time results received: 09/18/18 4:45 AM (use smartphrase ".now" to insert current time)  Test: troponin Critical Value: 0.03  Name of Provider Notified: Nanavati  Orders Received? Or Actions Taken?: Orders Received - See Orders for details

## 2018-09-18 NOTE — ED Notes (Signed)
Lab called to send flu swabs to ED. Awaiting flu swabs

## 2018-09-18 NOTE — Discharge Instructions (Addendum)
We saw you in the ER for weakness. All the results in the ER are normal, labs and imaging. We are not sure what is causing your symptoms. The workup in the ER is not complete, and is limited to screening for life threatening and emergent conditions only, so please see a primary care doctor for further evaluation.  Please return to the ER if your symptoms worsen; you have increased pain, fevers, chills, inability to keep any medications down, confusion. Otherwise see the outpatient doctor as requested.

## 2018-09-18 NOTE — ED Provider Notes (Addendum)
[facility] DEPT Provider Note   CSN: [id number] Arrival date & time: 09/18/18  2440     History   Chief Complaint Chief Complaint  Patient presents with  . Emesis  . Nausea    HPI Bridget Butler is a 72 y.o. female.  HPI 72 year old female comes in with chief complaint of vomiting, nausea and weakness. Patient reports that she started feeling weak and malaise during the daytime yesterday.  At nighttime she started having nausea and vomiting.  She has had about 3 episodes of emesis prior to ED arrival, and one episode of emesis in the ED.  Patient does not have any nausea, except for when she is about to vomit.  She denies any abdominal pain or diarrhea.  Patient denies any sick contacts and does not recall eating anything that was suspicious.  Patient denies any headache, neck pain, chest pain, shortness of breath.  She does have urinary frequency but denies any dysuria or hematuria.   Patient has history of diabetes and hyperlipidemia.  No known history of CAD.  Past Medical History:  Diagnosis Date  . Cataract   . Diabetes mellitus without complication ([place])   . Thyroid disease     Patient Active Problem List   Diagnosis Date Noted  . Abnormal thyroid stimulating hormone (TSH) level 04/23/2018  . Class 2 severe obesity due to excess calories with serious comorbidity and body mass index (BMI) of 35.0 to 35.9 in adult ([place]) 03/29/2018  . Insulin dependent type 2 diabetes mellitus, uncontrolled ([place]) 01/31/2018  . Essential hypertension 01/31/2018  . Hyperlipidemia LDL goal <100 01/31/2018  . Gastroesophageal reflux disease 01/31/2018  . Osteopenia of multiple sites 12/21/2015    Past Surgical History:  Procedure Laterality Date  . COSMETIC SURGERY    . GALLBLADDER SURGERY    . TUBAL LIGATION       OB History   None      Home Medications    Prior to Admission medications   Medication Sig Start Date End Date Taking?  Authorizing Provider  aspirin EC 81 MG tablet Take 81 mg by mouth daily.   Yes [provider]  Dulaglutide (TRULICITY) 1.02 VO/5.3GU SOPN Inject 0.5 mLs into the skin once a week. Patient taking differently: Inject 0.5 mLs into the skin once a week. Sunday 07/08/18  Yes Tenna Delaine D, PA-C  ezetimibe (ZETIA) 10 MG tablet Take 1 tablet (10 mg total) by mouth daily. 07/08/18  Yes Timmothy Euler, [place] D, PA-C  Insulin Glargine (LANTUS SOLOSTAR) 100 UNIT/ML Solostar Pen Inject 20 Units into the skin every morning. 07/27/18  Yes Shawnee Knapp, MD  lisinopril-hydrochlorothiazide (PRINZIDE,ZESTORETIC) 20-12.5 MG tablet TAKE 2 TABLETS EVERY DAY  (NEW DOSE) 08/31/18  Yes Shawnee Knapp, MD  Multiple Vitamin (MULTIVITAMIN WITH MINERALS) TABS Take 1 tablet by mouth daily.   Yes [provider]  Omega-3 Fatty Acids (FISH OIL) 1000 MG CAPS Take 1 capsule by mouth daily.   Yes [provider]  omeprazole (PRILOSEC) 40 MG capsule Take 40 mg by mouth daily.   Yes [provider]  pravastatin (PRAVACHOL) 20 MG tablet Take 1 tablet (20 mg total) by mouth daily. Patient taking differently: Take 20 mg by mouth every morning.  08/03/18  Yes Timmothy Euler, [place] D, PA-C  sitaGLIPtin-metformin (JANUMET) 50-1000 MG tablet Take 1 tablet by mouth daily. 07/08/18  Yes Timmothy Euler, [place] D, PA-C  Blood Glucose Monitoring Suppl (TRUE METRIX METER) DEVI 1 Device by Does not apply  route 2 (two) times daily. 07/12/18   Tenna Delaine D, PA-C  cephALEXin (KEFLEX) 500 MG capsule Take 1 capsule (500 mg total) by mouth 2 (two) times daily. 09/18/18   Varney Biles, MD  glucose blood (TRUE METRIX BLOOD GLUCOSE TEST) test strip Use as instructed 07/12/18   Tenna Delaine D, PA-C  Insulin Glargine (BASAGLAR KWIKPEN) 100 UNIT/ML SOPN Inject 0.2 mLs (20 Units total) into the skin daily. Patient not taking: Reported on 09/18/2018 07/08/18   Shawnee Knapp, MD  Insulin Pen Needle (PEN NEEDLES) 32G X 5 MM MISC 1  Units by Does not apply route daily. 07/08/18   Shawnee Knapp, MD  ondansetron (ZOFRAN ODT) 8 MG disintegrating tablet Take 1 tablet (8 mg total) by mouth every 8 (eight) hours as needed for nausea. 09/18/18   Varney Biles, MD  ranitidine (ZANTAC) 150 MG tablet Take 1 tablet (150 mg total) by mouth 2 (two) times daily. Patient not taking: Reported on 07/08/2018 12/29/17   Shawnee Knapp, MD  TRUEPLUS LANCETS 33G MISC 300 Devices by Does not apply route 2 (two) times daily. 07/12/18   Leonie Douglas, PA-C    Family History Family History  Problem Relation Age of Onset  . Heart disease Mother   . Hypertension Mother   . Cancer Sister   . Diabetes Sister     Social History Social History   Tobacco Use  . Smoking status: Never Smoker  . Smokeless tobacco: Never Used  Substance Use Topics  . Alcohol use: No    Frequency: Never  . Drug use: No     Allergies   Patient has no known allergies.   Review of Systems Review of Systems  Constitutional: Positive for activity change.  Respiratory: Negative for shortness of breath.   Cardiovascular: Negative for chest pain.  Gastrointestinal: Positive for nausea and vomiting.  Neurological: Negative for dizziness.  All other systems reviewed and are negative.    Physical Exam Updated Vital Signs BP (!) 144/64   Pulse 98   Temp 98.5 F (36.9 C) (Oral)   Resp 15   Ht 5\' 3"  (1.6 m)   Wt 86.2 kg   SpO2 98%   BMI 33.66 kg/m   Physical Exam  Constitutional: She is oriented to person, place, and time. She appears well-developed.  HENT:  Head: Normocephalic and atraumatic.  Eyes: EOM are normal.  Neck: Normal range of motion. Neck supple.  Cardiovascular:  Tachycardia  Pulmonary/Chest: Effort normal.  Abdominal: Bowel sounds are normal. There is no tenderness.  Neurological: She is alert and oriented to person, place, and time. No cranial nerve deficit. Coordination normal.  Skin: Skin is warm and dry.  Nursing note and  vitals reviewed.    ED Treatments / Results  Labs (all labs ordered are listed, but only abnormal results are displayed) Labs Reviewed  COMPREHENSIVE METABOLIC PANEL - Abnormal; Notable for the following components:      Result Value   Sodium 134 (*)    Chloride 96 (*)    Glucose, Bld 205 (*)    Creatinine, Ser 1.41 (*)    GFR calc non Af Amer 37 (*)    GFR calc Af Amer 43 (*)    All other components within normal limits  CBC - Abnormal; Notable for the following components:   WBC 15.2 (*)    RBC 3.63 (*)    Hemoglobin 10.9 (*)    HCT 34.5 (*)    All other components  within normal limits  URINALYSIS, ROUTINE W REFLEX MICROSCOPIC - Abnormal; Notable for the following components:   Glucose, UA 150 (*)    All other components within normal limits  TROPONIN I - Abnormal; Notable for the following components:   Troponin I 0.03 (*)    All other components within normal limits  URINE CULTURE  TROPONIN I  INFLUENZA PANEL BY PCR (TYPE A & B)    EKG EKG Interpretation  Date/Time:  Saturday September 18 2018 04:28:20 EST Ventricular Rate:  106 PR Interval:    QRS Duration: 82 QT Interval:  355 QTC Calculation: 472 R Axis:   66 Text Interpretation:  Sinus tachycardia Borderline T abnormalities, inferior leads No acute changes No significant change since last tracing Confirmed by Varney Biles ([id number]) on 09/18/2018 5:20:16 AM   Radiology Dg Chest Port 1 View  Result Date: 09/18/2018 CLINICAL DATA:  Nausea and vomitting with unexplained tiredness. EXAM: PORTABLE CHEST - 1 VIEW COMPARISON:  06/17/2007 FINDINGS: Lungs are clear. Heart size upper limits normal for technique. Normal mediastinal contour. No effusion. Visualized bones unremarkable. IMPRESSION: Stable borderline cardiomegaly.  No acute findings. Electronically Signed   By: Lucrezia Europe M.D.   On: 09/18/2018 07:35    Procedures Procedures (including critical care time)  Medications Ordered in ED Medications    sodium chloride 0.9 % bolus 1,000 mL (1,000 mLs Intravenous New Bag/Given 09/18/18 0711)  sodium chloride 0.9 % bolus 1,000 mL (0 mLs Intravenous Stopped 09/18/18 0446)     Initial Impression / Assessment and Plan / ED Course  I have reviewed the triage vital signs and the nursing notes.  Pertinent labs & imaging results that were available during my care of the patient were reviewed by me and considered in my medical decision making (see chart for details).  Clinical Course as of Sep 18 744  Sat Sep 18, 2018  0730 White count is elevated.  However UA is completely clean.  WBC(!): 15.2 [AN]  0730 Troponin I is detectable.  Initial EKG did not show any acute findings.  Repeat troponin pending.  Troponin I(!!): 0.03 [AN]  0730 Creatinine has gone up -patient will receive 2 L of IV fluid in the ED.  Creatinine(!): 1.41 [AN]  0731 Results from the ER have been discussed with the patient and family. Patient is agreeable to going home with clinical UTI given that she is having urinary frequency.  She will return to the ER if her symptoms start getting worse.   [AN]    Clinical Course User Index [AN] Varney Biles, MD    72 year old female comes in with chief complaint of nausea, vomiting and weakness. She is diabetic and review of system is positive for urinary frequency. No abdominal pain, back pain, fevers, chills, headaches, chest pain, neuro symptoms.  She is having some body aches.  Differential diagnosis essentially includes silent MI, UTI, influenza, DKA, non anion gap DKA. Appropriate labs have been ordered.  7:46 AM Second troponin is pending at this time.  Influenza level is also pending. Signing out care to Dr. Vivi Martens at this time. If patient's troponin is rising or the flu is positive, then she will be treated appropriate to the lab findings. If however patient's troponin is not rising and she has a negative flu then she will go home with a prescription of Keflex  and Zofran.  Final Clinical Impressions(s) / ED Diagnoses   Final diagnoses:  Non-intractable vomiting with nausea, unspecified vomiting type  Elevated creatine  kinase    ED Discharge Orders         Ordered    cephALEXin (KEFLEX) 500 MG capsule  2 times daily     09/18/18 0734    ondansetron (ZOFRAN ODT) 8 MG disintegrating tablet  Every 8 hours PRN     09/18/18 0740           Varney Biles, MD 09/18/18 3009    Varney Biles, MD 09/18/18 [phone]

## 2018-09-18 NOTE — ED Triage Notes (Signed)
Pt reports nausea and vomiting since last night. She reports 2-3 episodes of vomiting in the last 24 hours. She states that it consists of the food she has eaten. She denies pain, but states that she feels very tired. A&Ox4. Ambulatory.

## 2018-09-18 NOTE — ED Provider Notes (Signed)
Patient signed to me by Dr. Kathrynn Humble.  Plan was to check repeat troponin which was negative.  Influenza test also negative.  Patient himself feels much better after receiving IV fluids.  His plan was to treat her empirically for UTI and have her follow-up with her doctor.  Patient feels comfortable with this.   Lacretia Leigh, MD 09/18/18 [phone]

## 2018-09-18 NOTE — ED Notes (Signed)
Pt has a cup of soda done well

## 2018-09-19 LAB — URINE CULTURE: CULTURE: NO GROWTH

## 2018-09-20 ENCOUNTER — Telehealth: Payer: Self-pay

## 2018-09-20 ENCOUNTER — Encounter: Payer: Self-pay | Admitting: Family Medicine

## 2018-09-20 ENCOUNTER — Other Ambulatory Visit: Payer: Self-pay | Admitting: Family Medicine

## 2018-09-20 ENCOUNTER — Ambulatory Visit (INDEPENDENT_AMBULATORY_CARE_PROVIDER_SITE_OTHER): Payer: Medicare HMO | Admitting: Family Medicine

## 2018-09-20 ENCOUNTER — Ambulatory Visit (HOSPITAL_BASED_OUTPATIENT_CLINIC_OR_DEPARTMENT_OTHER)
Admission: RE | Admit: 2018-09-20 | Discharge: 2018-09-20 | Disposition: A | Discharge status: Home / Self Care | Payer: Medicare HMO | Source: Ambulatory Visit | Attending: Family Medicine | Admitting: Family Medicine

## 2018-09-20 ENCOUNTER — Other Ambulatory Visit: Payer: Self-pay

## 2018-09-20 ENCOUNTER — Ambulatory Visit (INDEPENDENT_AMBULATORY_CARE_PROVIDER_SITE_OTHER): Payer: Medicare HMO

## 2018-09-20 VITALS — BP 131/69 | HR 86 | Temp 98.6°F | Resp 20 | Ht 63.0 in | Wt 192.6 lb

## 2018-09-20 DIAGNOSIS — R0602 Shortness of breath: Secondary | ICD-10-CM | POA: Insufficient documentation

## 2018-09-20 DIAGNOSIS — D72829 Elevated white blood cell count, unspecified: Secondary | ICD-10-CM | POA: Diagnosis not present

## 2018-09-20 DIAGNOSIS — R600 Localized edema: Secondary | ICD-10-CM | POA: Insufficient documentation

## 2018-09-20 DIAGNOSIS — I83811 Varicose veins of right lower extremities with pain: Secondary | ICD-10-CM | POA: Diagnosis not present

## 2018-09-20 LAB — POCT CBC
Granulocyte percent: 56.1 %G (ref 37–80)
HCT, POC: 30.7 % (ref 29–41)
Hemoglobin: 10.5 g/dL (ref 9.5–13.5)
Lymph, poc: 2.4 (ref 0.6–3.4)
MCH, POC: 31.6 pg — AB (ref 27–31.2)
MCHC: 34.3 g/dL (ref 31.8–35.4)
MCV: 92.3 fL (ref 76–111)
MID (cbc): 0.3 (ref 0–0.9)
MPV: 7 fL (ref 0–99.8)
POC Granulocyte: 3.5 (ref 2–6.9)
POC LYMPH PERCENT: 38.9 %L (ref 10–50)
POC MID %: 5 %M (ref 0–12)
Platelet Count, POC: 277 10*3/uL (ref 142–424)
RBC: 3.33 M/uL — AB (ref 4.04–5.48)
RDW, POC: 14.2 %
WBC: 6.2 10*3/uL (ref 4.6–10.2)

## 2018-09-20 MED ORDER — FUROSEMIDE 20 MG PO TABS: 20.0000 mg | ORAL_TABLET | Freq: Every day | ORAL | 0 refills | Status: DC

## 2018-09-20 NOTE — Patient Instructions (Addendum)
  Venous doppler later today If normal then please start lasix once a day for 5 days  Go to [facility], [place], [place] [address] [phone] go in main entrance to right [address].     If you have lab work done today you will be contacted with your lab results within the next 2 weeks.  If you have not heard from [place] then please contact us. The fastest way to get your results is to register for My Chart.   IF you received an x-ray today, you will receive an invoice from [facility]. Please contact [facility] Radiology at [phone] with questions or concerns regarding your invoice.   IF you received labwork today, you will receive an invoice from [facility]. Please contact [facility] at [phone] with questions or concerns regarding your invoice.   Our billing staff will not be able to assist you with questions regarding bills from these companies.  You will be contacted with the lab results as soon as they are available. The fastest way to get your results is to activate your My Chart account. Instructions are located on the last page of this paperwork. If you have not heard from [place] regarding the results in 2 weeks, please contact this office.

## 2018-09-20 NOTE — Progress Notes (Signed)
12/2/201911:41 AM  Bridget Butler 03-19-1946, 72 y.o. female [id number]  Chief Complaint  Patient presents with  . Leg Swelling    right leg swelling and its sore since thursday   . Shortness of Breath    HPI:   Patient is a 72 y.o. female with past medical history significant for DM2, HTN, HLP who presents today for leg swelling and SOB   In ER on nov 30th Nausea and vomiting Got 2L IVF CXR with stable cardiomegaly WBC 15 Treated empirically for UTI give frequency with keflex- ur cx neg Flu neg cxr was negative EKG and trop unremarkable  a1c in march 2019 7.6  She did not take abx for 2 days and then stopped as it says do not take with you are a diabetic  Day when she went to ER Feeling really tired, 90/57 Fever and sweats Nauseous and vomiting SOB  Since she came out of the ER Still having SOB, needs to take deep breathes when talking Has right leg swelling and tenderness Denies any chest pain but does get palpitations when SOB Today better Leg swelling started before ER No recent travel Nausea and vomiting stopped This am cbg 55 Nonsmoker  PCP Dr Brigitte Pulse  Fall Risk  09/20/2018 03/29/2018 02/12/2018 12/29/2017  Falls in the past year? 0 No No No     Depression screen [facility] 2/9 09/20/2018 03/29/2018 02/12/2018  Decreased Interest 0 0 0  Down, Depressed, Hopeless 0 0 0  PHQ - 2 Score 0 0 0    No Known Allergies  Prior to Admission medications   Medication Sig Start Date End Date Taking? Authorizing Provider  aspirin EC 81 MG tablet Take 81 mg by mouth daily.   Yes [provider]  Blood Glucose Monitoring Suppl (TRUE METRIX METER) DEVI 1 Device by Does not apply route 2 (two) times daily. 07/12/18  Yes Timmothy Euler, [place] D, PA-C  cephALEXin (KEFLEX) 500 MG capsule Take 1 capsule (500 mg total) by mouth 2 (two) times daily. 09/18/18  Yes Nanavati, Ankit, MD  Dulaglutide (TRULICITY) 8.18 EX/9.3ZJ SOPN Inject 0.5 mLs into the skin once a week. Patient  taking differently: Inject 0.5 mLs into the skin once a week. Sunday 07/08/18  Yes Tenna Delaine D, PA-C  ezetimibe (ZETIA) 10 MG tablet Take 1 tablet (10 mg total) by mouth daily. 07/08/18  Yes Timmothy Euler, [place] D, PA-C  glucose blood (TRUE METRIX BLOOD GLUCOSE TEST) test strip Use as instructed 07/12/18  Yes Timmothy Euler, [place] D, PA-C  Insulin Glargine (BASAGLAR KWIKPEN) 100 UNIT/ML SOPN Inject 0.2 mLs (20 Units total) into the skin daily. 07/08/18  Yes Shawnee Knapp, MD  Insulin Glargine (LANTUS SOLOSTAR) 100 UNIT/ML Solostar Pen Inject 20 Units into the skin every morning. 07/27/18  Yes Shawnee Knapp, MD  Insulin Pen Needle (PEN NEEDLES) 32G X 5 MM MISC 1 Units by Does not apply route daily. 07/08/18  Yes Shawnee Knapp, MD  lisinopril-hydrochlorothiazide (PRINZIDE,ZESTORETIC) 20-12.5 MG tablet TAKE 2 TABLETS EVERY DAY  (NEW DOSE) 08/31/18  Yes Shawnee Knapp, MD  Multiple Vitamin (MULTIVITAMIN WITH MINERALS) TABS Take 1 tablet by mouth daily.   Yes [provider]  Omega-3 Fatty Acids (FISH OIL) 1000 MG CAPS Take 1 capsule by mouth daily.   Yes [provider]  omeprazole (PRILOSEC) 40 MG capsule Take 40 mg by mouth daily.   Yes [provider]  ondansetron (ZOFRAN ODT) 8 MG disintegrating tablet Take 1 tablet (8 mg total) by  mouth every 8 (eight) hours as needed for nausea. 09/18/18  Yes Varney Biles, MD  pravastatin (PRAVACHOL) 20 MG tablet Take 1 tablet (20 mg total) by mouth daily. Patient taking differently: Take 20 mg by mouth every morning.  08/03/18  Yes Timmothy Euler, [place] D, PA-C  ranitidine (ZANTAC) 150 MG tablet Take 1 tablet (150 mg total) by mouth 2 (two) times daily. 12/29/17  Yes Shawnee Knapp, MD  sitaGLIPtin-metformin (JANUMET) 50-1000 MG tablet Take 1 tablet by mouth daily. 07/08/18  Yes Timmothy Euler, [place] D, PA-C  TRUEPLUS LANCETS 33G MISC 300 Devices by Does not apply route 2 (two) times daily. 07/12/18  Yes Leonie Douglas, PA-C    Past Medical History:    Diagnosis Date  . Cataract   . Diabetes mellitus without complication ([place])   . Thyroid disease     Past Surgical History:  Procedure Laterality Date  . COSMETIC SURGERY    . GALLBLADDER SURGERY    . TUBAL LIGATION      Social History   Tobacco Use  . Smoking status: Never Smoker  . Smokeless tobacco: Never Used  Substance Use Topics  . Alcohol use: No    Frequency: Never    Family History  Problem Relation Age of Onset  . Heart disease Mother   . Hypertension Mother   . Cancer Sister   . Diabetes Sister     ROS Per hpi  OBJECTIVE:  Blood pressure 131/69, pulse 86, temperature 98.6 F (37 C), temperature source Oral, resp. rate 20, height 5\' 3"  (1.6 m), weight 192 lb 9.6 oz (87.4 kg), SpO2 99 %. Body mass index is 34.12 kg/m.   Wt Readings from Last 3 Encounters:  09/20/18 192 lb 9.6 oz (87.4 kg)  09/18/18 190 lb (86.2 kg)  07/08/18 190 lb 9.6 oz (86.5 kg)    Physical Exam  Constitutional: She is oriented to person, place, and time. She appears well-developed and well-nourished.  HENT:  Head: Normocephalic and atraumatic.  Mouth/Throat: Oropharynx is clear and moist. No oropharyngeal exudate.  Eyes: Pupils are equal, round, and reactive to light. Conjunctivae and EOM are normal. No scleral icterus.  Neck: Neck supple. No JVD present.  Cardiovascular: Normal rate, regular rhythm and normal heart sounds. Exam reveals no gallop and no friction rub.  No murmur heard. Pulmonary/Chest: Effort normal and breath sounds normal. She has no wheezes. She has no rales.  Musculoskeletal:       Right lower leg: She exhibits tenderness and edema.       Left lower leg: She exhibits no tenderness and no edema.  Neurological: She is alert and oriented to person, place, and time.  Skin: Skin is warm and dry.  Psychiatric: She has a normal mood and affect.  Nursing note and vitals reviewed.   Results for orders placed or performed in visit on 09/20/18 (from the past 24  hour(s))  POCT CBC     Status: Abnormal   Collection Time: 09/20/18 12:04 PM  Result Value Ref Range   WBC 6.2 4.6 - 10.2 K/uL   Lymph, poc 2.4 0.6 - 3.4   POC LYMPH PERCENT 38.9 10 - 50 %L   MID (cbc) 0.3 0 - 0.9   POC MID % 5.0 0 - 12 %M   POC Granulocyte 3.5 2 - 6.9   Granulocyte percent 56.1 37 - 80 %G   RBC 3.33 (A) 4.04 - 5.48 M/uL   Hemoglobin 10.5 9.5 - 13.5 g/dL   HCT, POC  30.7 29 - 41 %   MCV 92.3 76 - 111 fL   MCH, POC 31.6 (A) 27 - 31.2 pg   MCHC 34.3 31.8 - 35.4 g/dL   RDW, POC 14.2 %   Platelet Count, POC 277 142 - 424 K/uL   MPV 7.0 0 - 99.8 fL    Dg Chest 2 View  Result Date: 09/20/2018 CLINICAL DATA:  Shortness of breath today. Bilateral lower extremity edema. EXAM: CHEST - 2 VIEW COMPARISON:  Single view of the chest 09/18/2018. PA and lateral chest 06/17/2007. FINDINGS: The lungs are clear. Heart size is normal. No pneumothorax or pleural effusion. No acute or focal bony abnormality. IMPRESSION: No acute disease. Electronically Signed   By: Inge Rise M.D.   On: 09/20/2018 12:18    ASSESSMENT and PLAN  1. Shortness of breath Exam, CXR unremarkable. [place] to r/o VTE. Lasix if negative. Consider pulm referral - DG Chest 2 View; Future - POCT CBC - Comprehensive metabolic panel - US Venous Img Lower Unilateral Right; Future  2. Leg edema, right - US Venous Img Lower Unilateral Right; Future  3. Leukocytosis, unspecified type Resolved.   Other orders - furosemide (LASIX) 20 MG tablet; Take 1 tablet (20 mg total) by mouth daily.  Return in about 2 weeks (around 10/04/2018) for Dr Brigitte Pulse.    Rutherford Guys, MD Primary Care at [address] [place], [place] [address] Ph.  [phone] Fax [phone]

## 2018-09-21 LAB — COMPREHENSIVE METABOLIC PANEL
ALT: 20 IU/L (ref 0–32)
AST: 19 IU/L (ref 0–40)
Albumin/Globulin Ratio: 1.3 (ref 1.2–2.2)
Albumin: 4.2 g/dL (ref 3.5–4.8)
Alkaline Phosphatase: 60 IU/L (ref 39–117)
BUN/Creatinine Ratio: 10 — ABNORMAL LOW (ref 12–28)
BUN: 12 mg/dL (ref 8–27)
Bilirubin Total: 0.3 mg/dL (ref 0.0–1.2)
CO2: 24 mmol/L (ref 20–29)
Calcium: 9.9 mg/dL (ref 8.7–10.3)
Chloride: 100 mmol/L (ref 96–106)
Creatinine, Ser: 1.17 mg/dL — ABNORMAL HIGH (ref 0.57–1.00)
GFR calc Af Amer: 54 mL/min/{1.73_m2} — ABNORMAL LOW (ref >59)
GFR calc non Af Amer: 47 mL/min/{1.73_m2} — ABNORMAL LOW (ref >59)
Globulin, Total: 3.2 g/dL (ref 1.5–4.5)
Glucose: 133 mg/dL — ABNORMAL HIGH (ref 65–99)
Potassium: 4 mmol/L (ref 3.5–5.2)
Sodium: 141 mmol/L (ref 134–144)
Total Protein: 7.4 g/dL (ref 6.0–8.5)

## 2018-10-03 ENCOUNTER — Other Ambulatory Visit: Payer: Self-pay | Admitting: Family Medicine

## 2018-10-03 DIAGNOSIS — Z794 Long term (current) use of insulin: Principal | ICD-10-CM

## 2018-10-03 DIAGNOSIS — IMO0002 Reserved for concepts with insufficient information to code with codable children: Secondary | ICD-10-CM

## 2018-10-03 DIAGNOSIS — E1165 Type 2 diabetes mellitus with hyperglycemia: Secondary | ICD-10-CM

## 2018-10-03 NOTE — Telephone Encounter (Signed)
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  However her enrollment will end on 10/18/2018 so she must reapply to continue to get her medication through this program in 2020.  They recommend a new complete application be received by [employer] 2 weeks before the current enrollment period ends which means it needs to be submitted ASAP today.  Patient must complete her section and provide requested documentation that shows she needs eligibility requirements.  We must complete a healthcare provider section of the [employer] application which is available at [email or web address] and submit a new paper prescription for 1 year supply and 90-day increments for both meds.   Please download the application and complete the healthcare provider section.  Then bring to me for signature and I will print off the scripts.  Then patient can be called to pick this up so she can complete her part and submit along with what ever other documentation is needed but this does need to be done ASAP or if she will be out of medication for some time.  If patient does not think she can complete the forms and get the necessary documentation together by herself, we can call the [facility] care management team as often they will help patients complete and submit these applications. Chart states that pt's [facility] does make her eligible for the [place] services.  The [facility] care management phone number is [phone] and fax is [phone].

## 2018-10-05 MED ORDER — BASAGLAR KWIKPEN 100 UNIT/ML ~~LOC~~ SOPN: 20.0000 [IU] | PEN_INJECTOR | Freq: Every day | SUBCUTANEOUS | 3 refills | Status: DC

## 2018-10-05 MED ORDER — DULAGLUTIDE 0.75 MG/0.5ML ~~LOC~~ SOAJ: 0.5000 mL | SUBCUTANEOUS | 3 refills | Status: DC

## 2018-10-05 NOTE — Telephone Encounter (Signed)
Patient was called will come in and pick up forms today.

## 2018-10-05 NOTE — Telephone Encounter (Signed)
Bridget Butler completed forms, I signed, prescriptions attached. Pt informed ready for pick up for her completion and submission

## 2018-10-06 ENCOUNTER — Other Ambulatory Visit: Payer: Self-pay | Admitting: Physician Assistant

## 2018-10-06 DIAGNOSIS — E1165 Type 2 diabetes mellitus with hyperglycemia: Secondary | ICD-10-CM

## 2018-10-06 DIAGNOSIS — E785 Hyperlipidemia, unspecified: Secondary | ICD-10-CM

## 2018-10-06 DIAGNOSIS — Z794 Long term (current) use of insulin: Principal | ICD-10-CM

## 2018-10-06 DIAGNOSIS — IMO0002 Reserved for concepts with insufficient information to code with codable children: Secondary | ICD-10-CM

## 2018-10-07 ENCOUNTER — Encounter: Payer: Self-pay | Admitting: Family Medicine

## 2018-10-07 NOTE — Telephone Encounter (Signed)
Both Rx were ordered by PCP early this morning- they were sent to mail order pharmacy.

## 2018-10-07 NOTE — Progress Notes (Signed)
Right femur neck: BMD:0.630 T-Score:-2.30 Z-score:-0.80 R femur neck Left femur neck: BMD: 0.640 T-score: -2.20 Z-score: -0.70 L femur neck  Osteopenia at Multiple sites

## 2018-10-07 NOTE — Telephone Encounter (Signed)
Copied from [place] #[id number]. Topic: Quick Communication - Rx Refill/Question >> Oct 07, 2018  4:19 PM Jackey Loge, Lenna Sciara wrote: Medication: JANUMET 50-1000 MG tablet, ezetimibe (ZETIA) 10 MG tablet  Has the patient contacted their pharmacy? No. (Agent: If no, request that the patient contact the pharmacy for the refill.) (Agent: If yes, when and what did the pharmacy advise?)  Preferred Pharmacy (with phone number or street name):   Agent: Please be advised that RX refills may take up to 3 business days. We ask that you follow-up with your pharmacy.

## 2018-10-14 ENCOUNTER — Telehealth: Payer: Self-pay | Admitting: Family Medicine

## 2018-10-14 ENCOUNTER — Other Ambulatory Visit: Payer: Self-pay

## 2018-10-14 DIAGNOSIS — R7989 Other specified abnormal findings of blood chemistry: Secondary | ICD-10-CM

## 2018-10-14 NOTE — Telephone Encounter (Unsigned)
Copied from [place] [id number]. Topic: General - Other >> Oct 14, 2018  1:48 PM Oneta Rack wrote: Relation to pt: self  Call back number: [phone]   Reason for call:  Patient was seen 09/20/18 by Dr, Grant Fontana and due to [facility] results, patient states PCP was going to refer her to a specialist (chart doesn't reflect ) please advise patient directly regarding referral

## 2018-10-14 NOTE — Telephone Encounter (Signed)
Spoke with pt advised endocrinology referral not placed when she saw Timmothy Euler but Endocrinology referral placed today and pt advised. that referral coordinators will get to work on this and to look for a call from Endo in the next few days to week with an appointment.  Pt agreeable.

## 2018-10-29 ENCOUNTER — Encounter: Payer: Self-pay | Admitting: Internal Medicine

## 2018-10-29 ENCOUNTER — Ambulatory Visit: Payer: Medicare HMO | Admitting: Internal Medicine

## 2018-10-29 VITALS — BP 140/68 | HR 68 | Ht 63.0 in

## 2018-10-29 DIAGNOSIS — E0590 Thyrotoxicosis, unspecified without thyrotoxic crisis or storm: Secondary | ICD-10-CM

## 2018-10-29 DIAGNOSIS — E042 Nontoxic multinodular goiter: Secondary | ICD-10-CM | POA: Diagnosis not present

## 2018-10-29 LAB — T3, FREE: T3, Free: 3.8 pg/mL (ref 2.3–4.2)

## 2018-10-29 LAB — T4, FREE: Free T4: 0.97 ng/dL (ref 0.60–1.60)

## 2018-10-29 LAB — TSH: TSH: 0.03 u[IU]/mL — ABNORMAL LOW (ref 0.35–4.50)

## 2018-10-29 NOTE — Patient Instructions (Signed)
Please stop at the lab.  Please return in 3-4 months.   Hyperthyroidism  Hyperthyroidism is when the thyroid gland is too active (overactive). The thyroid gland is a small gland located in the lower front part of the neck, just in front of the windpipe (trachea). This gland makes hormones that help control how the body uses food for energy (metabolism) as well as how the heart and brain function. These hormones also play a role in keeping your bones strong. When the thyroid is overactive, it produces too much of a hormone called thyroxine. What are the causes? This condition may be caused by:  Graves' disease. This is a disorder in which the body's disease-fighting system (immune system) attacks the thyroid gland. This is the most common cause.  Inflammation of the thyroid gland.  A tumor in the thyroid gland.  Use of certain medicines, including: ? Prescription thyroid hormone replacement. ? Herbal supplements that mimic thyroid hormones. ? Amiodarone therapy.  Solid or fluid-filled lumps within your thyroid gland (thyroid nodules).  Taking in a large amount of iodine from foods or medicines. What increases the risk? You are more likely to develop this condition if:  You are female.  You have a family history of thyroid conditions.  You smoke tobacco.  You use a medicine called lithium.  You take medicines that affect the immune system (immunosuppressants). What are the signs or symptoms? Symptoms of this condition include:  Nervousness.  Inability to tolerate heat.  Unexplained weight loss.  Diarrhea.  Change in the texture of hair or skin.  Heart skipping beats or making extra beats.  Rapid heart rate.  Loss of menstruation.  Shaky hands.  Fatigue.  Restlessness.  Sleep problems.  Enlarged thyroid gland or a lump in the thyroid (nodule). You may also have symptoms of Graves' disease, which may include:  Protruding eyes.  Dry eyes.  Red or  swollen eyes.  Problems with vision. How is this diagnosed? This condition may be diagnosed based on:  Your symptoms and medical history.  A physical exam.  Blood tests.  Thyroid ultrasound. This test involves using sound waves to produce images of the thyroid gland.  A thyroid scan. A radioactive substance is injected into a vein, and images show how much iodine is present in the thyroid.  Radioactive iodine uptake test (RAIU). A small amount of radioactive iodine is given by mouth to see how much iodine the thyroid absorbs after a certain amount of time. How is this treated? Treatment depends on the cause and severity of the condition. Treatment may include:  Medicines to reduce the amount of thyroid hormone your body makes.  Radioactive iodine treatment (radioiodine therapy). This involves swallowing a small dose of radioactive iodine, in capsule or liquid form, to kill thyroid cells.  Surgery to remove part or all of your thyroid gland. You may need to take thyroid hormone replacement medicine for the rest of your life after thyroid surgery.  Medicines to help manage your symptoms. Follow these instructions at home:   Take over-the-counter and prescription medicines only as told by your health care provider.  Do not use any products that contain nicotine or tobacco, such as cigarettes and e-cigarettes. If you need help quitting, ask your health care provider.  Follow any instructions from your health care provider about diet. You may be instructed to limit foods that contain iodine.  Keep all follow-up visits as told by your health care provider. This is important. ? You will  need to have blood tests regularly so that your health care provider can monitor your condition. Contact a health care provider if:  Your symptoms do not get better with treatment.  You have a fever.  You are taking thyroid hormone replacement medicine and you: ? Have symptoms of  depression. ? Feel like you are tired all the time. ? Gain weight. Get help right away if:  You have chest pain.  You have decreased alertness or a change in your awareness.  You have abdominal pain.  You feel dizzy.  You have a rapid heartbeat.  You have an irregular heartbeat.  You have difficulty breathing. Summary  The thyroid gland is a small gland located in the lower front part of the neck, just in front of the windpipe (trachea).  Hyperthyroidism is when the thyroid gland is too active (overactive) and produces too much of a hormone called thyroxine.  The most common cause is Graves' disease, a disorder in which your immune system attacks the thyroid gland.  Hyperthyroidism can cause various symptoms, such as unexplained weight loss, nervousness, inability to tolerate heat, or changes in your heartbeat.  Treatment may include medicine to reduce the amount of thyroid hormone your body makes, radioiodine therapy, surgery, or medicines to manage symptoms. This information is not intended to replace advice given to you by your health care provider. Make sure you discuss any questions you have with your health care provider. Document Released: 10/06/2005 Document Revised: 09/16/2017 Document Reviewed: 09/16/2017 Elsevier Interactive Patient Education  2019 [employer].

## 2018-10-29 NOTE — Progress Notes (Addendum)
Patient ID: Bridget Butler, female   DOB: 1946-09-09, 73 y.o.   MRN: [id number]    HPI  Bridget Butler is a 73 y.o.-year-old female, referred by her PCP, Dr. Brigitte Pulse, for evaluation for subclinical thyrotoxicosis.  She has a h/o hyperthyroidism since ~2010. She was seeing Dr. Jeanann Lewandowsky - he retired. She was started on medication (cannot remember name) >> felt "off" >> ~stopped 2010.  I reviewed pt's thyroid tests: Lab Results  Component Value Date   TSH 0.054 (L) 07/08/2018   TSH 0.118 (L) 03/29/2018   FREET4 1.05 07/08/2018   T3FREE 3.3 07/08/2018   Antithyroid antibodies: No results found for: TSI   Thyroid uptake (08/28/2007):  There is heterogeneous uptake within a normal sized gland. There are regions of photopenia within the bilateral upper poles and in the left lower pole. The most intense uptake is in the mid pole of the right lobe of the thyroid gland. Decreased uptake in the inferior pole of the right lobe of the thyroid gland additionally.   Calculated 24-hour I-131 uptake is equal to 25.0%, which is normal (normal 10-30%).   IMPRESSION:   1. Heterogeneous uptake within the thyroid gland could represent resolving subacute thyroiditis in patient with a normal uptake.  2. Cannot exclude a multinodular goiter if TSH continues to remain depressed  She denies: - fatigue - excessive sweating/heat intolerance, but has cold intolerance - tremors - anxiety - palpitations - hyperdefecation - weight loss - hair loss  Pt does not have a FH of thyroid ds. No FH of thyroid cancer. No h/o radiation tx to head or neck.  No seaweed or kelp, no recent contrast studies. No steroid use. No herbal supplements. No Biotin use.  She was on ABx this Winter >> First part of 09/2018.  Pt. has a history of multinodular goiter per ultrasound from 06/28/2013.  2 biopsies were benign on 07/13/2013.  Pt denies: - feeling nodules in neck - hoarseness - dysphagia - choking - SOB with lying  down  She also has controlled DM: Lab Results  Component Value Date   HGBA1C 7.0 (H) 07/08/2018   ROS: Constitutional: + see HPI, + nocturia Eyes: no blurry vision, no xerophthalmia ENT: no sore throat, + see HPI, + hypoacusis Cardiovascular: no CP/SOB/palpitations/leg swelling Respiratory: no cough/SOB/+ wheezing Gastrointestinal: no N/V/D/C/+ heartburn Musculoskeletal: no muscle/joint aches Skin: no rashes Neurological: no tremors/numbness/tingling/dizziness Psychiatric: no depression/anxiety  Past Medical History:  Diagnosis Date  . Cataract   . Diabetes mellitus without complication ([place])   . Thyroid disease    Past Surgical History:  Procedure Laterality Date  . COSMETIC SURGERY    . GALLBLADDER SURGERY    . TUBAL LIGATION     Social History   Socioeconomic History  . Marital status: Married    Spouse name: Not on file  . Number of children: 3  . Years of education: Not on file  . Highest education level: Not on file  Occupational History  . Not on file  Social Needs  . Financial resource strain: Not on file  . Food insecurity:    Worry: Not on file    Inability: Not on file  . Transportation needs:    Medical: Not on file    Non-medical: Not on file  Tobacco Use  . Smoking status: Never Smoker  . Smokeless tobacco: Never Used  Substance and Sexual Activity  . Alcohol use: No    Frequency: Never  . Drug use: No  .  Sexual activity: Not on file  Lifestyle  . Physical activity:    Days per week: Not on file    Minutes per session: Not on file  . Stress: Not on file  Relationships  . Social connections:    Talks on phone: Not on file    Gets together: Not on file    Attends religious service: Not on file    Active member of club or organization: Not on file    Attends meetings of clubs or organizations: Not on file    Relationship status: Not on file  . Intimate partner violence:    Fear of current or ex partner: Not on file    Emotionally  abused: Not on file    Physically abused: Not on file    Forced sexual activity: Not on file  Other Topics Concern  . Not on file  Social History Narrative  . Not on file   Current Outpatient Medications on File Prior to Visit  Medication Sig Dispense Refill  . aspirin EC 81 MG tablet Take 81 mg by mouth daily.    . Blood Glucose Monitoring Suppl (TRUE METRIX METER) DEVI 1 Device by Does not apply route 2 (two) times daily. 1 Device 0  . Dulaglutide (TRULICITY) 6.29 BM/8.4XL SOPN Inject 0.5 mLs into the skin once a week. 12 pen 3  . ezetimibe (ZETIA) 10 MG tablet TAKE 1 TABLET EVERY DAY 90 tablet 0  . furosemide (LASIX) 20 MG tablet Take 1 tablet (20 mg total) by mouth daily. 5 tablet 0  . glucose blood (TRUE METRIX BLOOD GLUCOSE TEST) test strip Use as instructed 300 each 1  . Insulin Glargine (BASAGLAR KWIKPEN) 100 UNIT/ML SOPN Inject 0.2 mLs (20 Units total) into the skin daily. 30 mL 3  . Insulin Pen Needle (PEN NEEDLES) 32G X 5 MM MISC 1 Units by Does not apply route daily. 100 each 5  . JANUMET 50-1000 MG tablet TAKE 1 TABLET EVERY DAY 90 tablet 0  . lisinopril-hydrochlorothiazide (PRINZIDE,ZESTORETIC) 20-12.5 MG tablet TAKE 2 TABLETS EVERY DAY  (NEW DOSE) 180 tablet 0  . omeprazole (PRILOSEC) 40 MG capsule Take 40 mg by mouth daily.    . TRUEPLUS LANCETS 33G MISC 300 Devices by Does not apply route 2 (two) times daily. 300 each 1  . Multiple Vitamin (MULTIVITAMIN WITH MINERALS) TABS Take 1 tablet by mouth daily.    . Omega-3 Fatty Acids (FISH OIL) 1000 MG CAPS Take 1 capsule by mouth daily.    . pravastatin (PRAVACHOL) 20 MG tablet Take 1 tablet (20 mg total) by mouth daily. (Patient not taking: Reported on 10/29/2018) 90 tablet 0   No current facility-administered medications on file prior to visit.    No Known Allergies Family History  Problem Relation Age of Onset  . Heart disease Mother   . Hypertension Mother   . Cancer Sister   . Diabetes Sister    PE: BP 140/68    Pulse 68   Ht 5\' 3"  (1.6 m) Comment: measured  BMI 34.12 kg/m  Pulse ox could not be measured b/c long nails Wt Readings from Last 3 Encounters:  09/20/18 192 lb 9.6 oz (87.4 kg)  09/18/18 190 lb (86.2 kg)  07/08/18 190 lb 9.6 oz (86.5 kg)   Constitutional: overweight, in NAD Eyes: PERRLA, EOMI, no exophthalmos, no lid lag, no stare ENT: moist mucous membranes, no thyromegaly, but prominent R thyroid lobe palpated, no cervical lymphadenopathy Cardiovascular: RRR, No MRG Respiratory: + B  wheezes lower lung lobes Gastrointestinal: abdomen soft, NT, ND, BS+ Musculoskeletal: no deformities, strength intact in all 4 Skin: moist, warm, no rashes Neurological: no tremor with outstretched hands, DTR normal in all 4  ASSESSMENT: 1. Thyrotoxicosis  2. Multiple thyroid nodules  PLAN:  1. Patient with a long h/o low TSH, without thyrotoxic sxs: weight loss, heat intolerance, hyperdefecation, palpitations, anxiety.  - she does not appear to have exogenous causes for the low TSH.  - We discussed that possible causes of thyrotoxicosis are:  [name] Kitchen Graves ds   . Thyroiditis . toxic multinodular goiter/ toxic adenoma (especially as she does have a h/o thyroid nodules). Also, previous thyroid scan was not uniform (2008) - will check the TSH, fT3 and fT4 and also add thyroid stimulating antibodies to screen for Graves' disease.  - If the tests remain abnormal, we may need an uptake and scan to differentiate between the 3 above possible etiologies  - we discussed about possible modalities of treatment for the above conditions, to include methimazole use, radioactive iodine ablation or (last resort) surgery. -We also discussed about possible long-term consequences of uncontrolled thyrotoxicosis to include arrhythmia, hypercoagulability, and osteoporosis.  She agrees that he will need treatment for the condition if tests remain abnormal. - we may need to repeat thyroid ultrasound depending on the results of  the uptake and scan  - I do not feel that we need to add beta blockers at this time, since she is not tachycardic or tremulous - RTC in 3-4 months, but likely sooner for repeat labs  2. Multiple thyroid nodules -No neck compression symptoms -The 2 dominant nodules were biopsied with benign results in 2014 -If TFTs are still thyrotoxic, will need an uptake and scan and then we will probably need to repeat a thyroid ultrasound if any cold nodule is found.  We discussed that she probably will not need another biopsy unless the ultrasound characteristics of these nodules have changed.  Component     Latest Ref Rng & Units 10/29/2018  TSH     0.35 - 4.50 uIU/mL 0.03 (L)  Triiodothyronine,Free,Serum     2.3 - 4.2 pg/mL 3.8  T4,Free(Direct)     0.60 - 1.60 ng/dL 0.97  TSI     <140 % baseline <89   Low TSH with normal free thyroid hormones >> subclinical thyrotoxicosis. Graves Ab's are not high. Will check a  Thyroid Uptake and scan. Since she did not feel good on (likely) Methimazole before and since she is asymptomatic, will continue w/o treatment for now.  Reading Physician Reading Date Result Priority  Lavonia Dana, MD 11/30/2018     Narrative & Impression    CLINICAL DATA:  Thyrotoxicosis  EXAM: THYROID SCAN AND UPTAKE - 4 AND 24 HOURS  TECHNIQUE: Following oral administration of I-123 capsule, anterior planar imaging was acquired at 24 hours. Thyroid uptake was calculated with a thyroid probe at 4-6 hours and 24 hours.  RADIOPHARMACEUTICALS:  443.8 uCi I-123 sodium iodide p.o.  COMPARISON:  None  Radiographic correlation: Thyroid ultrasound 06/28/2013  FINDINGS: Focal areas of increased tracer localization in the lower poles of both thyroid lobes.  Uptake at the lower pole of the LEFT lobe corresponds to a previously identified 3.2 x 2.4 x 2.6 cm nodule.  Uptake at the inferior pole the RIGHT lobe corresponds to a previously identified 2.4 x 2.0 x 2.0 cm  diameter nodule.  Relative areas of decreased uptake in the mid thyroid lobes bilaterally which could represent subtle  cold nodules or areas of suppressed uptake.  Subtle tracer localization at the upper lobes.  4 hour I-123 uptake = 8.6% (normal 5-20%) 24 hour I-123 uptake = 25.3% (normal 10-30%)  IMPRESSION: Normal 4 hour and 24 hour radio iodine uptakes.  Multinodular thyroid gland with warm nodules at the inferior poles of both thyroid lobes, corresponding to dominant masses identified on a remote thyroid ultrasound consistent with adenomas.   Electronically Signed   By: Lavonia Dana M.D.   On: 11/30/2018 09:08      Mild toxic multinodular goiter.  Since she is asymptomatic, her pulse is not elevated, and her thyroid uptake is normal, we can just follow her without treatment for now.  We will repeat her TFTs at next visit.  Philemon Kingdom, MD PhD [facility]

## 2018-11-01 ENCOUNTER — Emergency Department (HOSPITAL_COMMUNITY)
Admission: EM | Admit: 2018-11-01 | Discharge: 2018-11-01 | Disposition: A | Discharge status: Home / Self Care | Payer: Medicare HMO | Attending: Emergency Medicine | Admitting: Emergency Medicine

## 2018-11-01 ENCOUNTER — Other Ambulatory Visit: Payer: Self-pay

## 2018-11-01 ENCOUNTER — Emergency Department (HOSPITAL_COMMUNITY): Payer: Medicare HMO

## 2018-11-01 ENCOUNTER — Ambulatory Visit: Payer: Self-pay | Admitting: *Deleted

## 2018-11-01 ENCOUNTER — Encounter (HOSPITAL_COMMUNITY): Payer: Self-pay

## 2018-11-01 DIAGNOSIS — Z794 Long term (current) use of insulin: Secondary | ICD-10-CM | POA: Diagnosis not present

## 2018-11-01 DIAGNOSIS — R0600 Dyspnea, unspecified: Secondary | ICD-10-CM

## 2018-11-01 DIAGNOSIS — I10 Essential (primary) hypertension: Secondary | ICD-10-CM | POA: Insufficient documentation

## 2018-11-01 DIAGNOSIS — E119 Type 2 diabetes mellitus without complications: Secondary | ICD-10-CM | POA: Insufficient documentation

## 2018-11-01 DIAGNOSIS — Z7982 Long term (current) use of aspirin: Secondary | ICD-10-CM | POA: Diagnosis not present

## 2018-11-01 DIAGNOSIS — J209 Acute bronchitis, unspecified: Secondary | ICD-10-CM | POA: Diagnosis not present

## 2018-11-01 DIAGNOSIS — R05 Cough: Secondary | ICD-10-CM | POA: Insufficient documentation

## 2018-11-01 DIAGNOSIS — R0602 Shortness of breath: Secondary | ICD-10-CM | POA: Diagnosis not present

## 2018-11-01 LAB — BASIC METABOLIC PANEL
Anion gap: 9 (ref 5–15)
BUN: 23 mg/dL (ref 8–23)
CALCIUM: 9.5 mg/dL (ref 8.9–10.3)
CO2: 27 mmol/L (ref 22–32)
Chloride: 102 mmol/L (ref 98–111)
Creatinine, Ser: 1.22 mg/dL — ABNORMAL HIGH (ref 0.44–1.00)
GFR calc Af Amer: 51 mL/min — ABNORMAL LOW (ref >60)
GFR calc non Af Amer: 44 mL/min — ABNORMAL LOW (ref >60)
Glucose, Bld: 135 mg/dL — ABNORMAL HIGH (ref 70–99)
Potassium: 3.9 mmol/L (ref 3.5–5.1)
Sodium: 138 mmol/L (ref 135–145)

## 2018-11-01 LAB — CBC WITH DIFFERENTIAL/PLATELET
Abs Immature Granulocytes: 0.02 10*3/uL (ref 0.00–0.07)
Basophils Absolute: 0 10*3/uL (ref 0.0–0.1)
Basophils Relative: 0 %
Eosinophils Absolute: 0.1 10*3/uL (ref 0.0–0.5)
Eosinophils Relative: 2 %
HCT: 33.6 % — ABNORMAL LOW (ref 36.0–46.0)
HEMOGLOBIN: 10.7 g/dL — AB (ref 12.0–15.0)
Immature Granulocytes: 0 %
Lymphocytes Relative: 50 %
Lymphs Abs: 3.7 10*3/uL (ref 0.7–4.0)
MCH: 31 pg (ref 26.0–34.0)
MCHC: 31.8 g/dL (ref 30.0–36.0)
MCV: 97.4 fL (ref 80.0–100.0)
Monocytes Absolute: 0.5 10*3/uL (ref 0.1–1.0)
Monocytes Relative: 6 %
Neutro Abs: 3.1 10*3/uL (ref 1.7–7.7)
Neutrophils Relative %: 42 %
Platelets: 298 10*3/uL (ref 150–400)
RBC: 3.45 MIL/uL — ABNORMAL LOW (ref 3.87–5.11)
RDW: 13.3 % (ref 11.5–15.5)
WBC: 7.4 10*3/uL (ref 4.0–10.5)
nRBC: 0 % (ref 0.0–0.2)

## 2018-11-01 LAB — BRAIN NATRIURETIC PEPTIDE: B Natriuretic Peptide: 14.8 pg/mL (ref 0.0–100.0)

## 2018-11-01 LAB — POCT I-STAT TROPONIN I: Troponin i, poc: 0 ng/mL (ref 0.00–0.08)

## 2018-11-01 LAB — THYROID STIMULATING IMMUNOGLOBULIN: TSI: <89 % baseline (ref <140)

## 2018-11-01 MED ORDER — ALBUTEROL SULFATE HFA 108 (90 BASE) MCG/ACT IN AERS
2.0000 | INHALATION_SPRAY | Freq: Once | RESPIRATORY_TRACT | Status: AC
  Administered 2018-11-01: 2 via RESPIRATORY_TRACT
  Filled 2018-11-01: qty 6.7

## 2018-11-01 MED ORDER — PREDNISONE 10 MG PO TABS: 20.0000 mg | ORAL_TABLET | Freq: Two times a day (BID) | ORAL | 0 refills | Status: DC

## 2018-11-01 MED ORDER — AZITHROMYCIN 250 MG PO TABS: ORAL_TABLET | ORAL | 0 refills | Status: DC

## 2018-11-01 NOTE — Telephone Encounter (Signed)
Pt calling with complaints of shortness of breath and wheezing that comes and goes for the past couple of months. Pt states that with doing activities such as putting on clothes on she becomes SOB and states her heart starts to beat fast. Pt states she does not have a history of heart or lung problems. Pt states she look on the internet for current symptoms and states she had 4 out of the 5 symptoms for congestive heart failure.Pt denies any other symptoms at this time. No appt availability for pt to be seen in office today.  Pt advised to seek treatment in the ED for current symptoms. Pt verbalized understanding.   Reason for Disposition . [1] MILD difficulty breathing (e.g., minimal/no SOB at rest, SOB with walking, pulse <100) AND [2] NEW-onset or WORSE than normal  Answer Assessment - Initial Assessment Questions 1. RESPIRATORY STATUS: "Describe your breathing?" (e.g., wheezing, shortness of breath, unable to speak, severe coughing)      Shortness of breath 2. ONSET: "When did this breathing problem begin?"      A couple of months 3. PATTERN "Does the difficult breathing come and go, or has it been constant since it started?"      constant 4. SEVERITY: "How bad is your breathing?" (e.g., mild, moderate, severe)    - MILD: No SOB at rest, mild SOB with walking, speaks normally in sentences, can lay down, no retractions, pulse < 100.    - MODERATE: SOB at rest, SOB with minimal exertion and prefers to sit, cannot lie down flat, speaks in phrases, mild retractions, audible wheezing, pulse 100-120.    - SEVERE: Very SOB at rest, speaks in single words, struggling to breathe, sitting hunched forward, retractions, pulse > 120      mild 5. RECURRENT SYMPTOM: "Have you had difficulty breathing before?" If so, ask: "When was the last time?" and "What happened that time?"      Yes for the past couple of months 6. CARDIAC HISTORY: "Do you have any history of heart disease?" (e.g., heart attack, angina,  bypass surgery, angioplasty)      No 7. LUNG HISTORY: "Do you have any history of lung disease?"  (e.g., pulmonary embolus, asthma, emphysema)     No 8. CAUSE: "What do you think is causing the breathing problem?"      unsure 9. OTHER SYMPTOMS: "Do you have any other symptoms? (e.g., dizziness, runny nose, cough, chest pain, fever)     No 10. PREGNANCY: "Is there any chance you are pregnant?" "When was your last menstrual period?"       n/a 11. TRAVEL: "Have you traveled out of the country in the last month?" (e.g., travel history, exposures)       n/a  Protocols used: BREATHING DIFFICULTY-A-AH

## 2018-11-01 NOTE — ED Triage Notes (Signed)
Pt states that she has had wheezing and shortness of breath for "months". Pt states that she has had a dry cough as well.  Pt does take lisinopril.

## 2018-11-01 NOTE — ED Provider Notes (Signed)
[facility] DEPT Provider Note   CSN: [id number] Arrival date & time: 11/01/18  1311     History   Chief Complaint Chief Complaint  Patient presents with  . Cough    HPI Bridget Butler is a 73 y.o. female.  Patient is a 73 year old female with past medical history of type 2 diabetes, hypertension, hyperlipidemia.  She presents today for evaluation of shortness of breath.  This is been occurring episodically for the past 3 months.  She states she has episodes of wheezing and difficulty breathing.  This seems to occur when she gets up quickly and with exertion.  She denies any fevers or chills.  She does report a persistent, nonproductive cough.  She denies any chest pain.  She does state that she had an episode of right leg swelling 1 month ago.  She had an ultrasound performed which was negative and the swelling went away.  The history is provided by the patient.  Cough  Cough characteristics:  Dry and non-productive Severity:  Mild Onset quality:  Gradual Duration: 3 months. Timing:  Constant Progression:  Worsening Chronicity:  New Relieved by:  Nothing Worsened by:  Nothing Ineffective treatments:  None tried   Past Medical History:  Diagnosis Date  . Cataract   . Diabetes mellitus without complication ([place])   . Thyroid disease     Patient Active Problem List   Diagnosis Date Noted  . Abnormal thyroid stimulating hormone (TSH) level 04/23/2018  . Class 2 severe obesity due to excess calories with serious comorbidity and body mass index (BMI) of 35.0 to 35.9 in adult ([place]) 03/29/2018  . Insulin dependent type 2 diabetes mellitus, uncontrolled ([place]) 01/31/2018  . Essential hypertension 01/31/2018  . Hyperlipidemia LDL goal <100 01/31/2018  . Gastroesophageal reflux disease 01/31/2018  . Osteopenia of multiple sites 12/21/2015    Past Surgical History:  Procedure Laterality Date  . COSMETIC SURGERY    . GALLBLADDER SURGERY    .  TUBAL LIGATION       OB History   No obstetric history on file.      Home Medications    Prior to Admission medications   Medication Sig Start Date End Date Taking? Authorizing Provider  aspirin EC 81 MG tablet Take 81 mg by mouth daily.    [provider]  Blood Glucose Monitoring Suppl (TRUE METRIX METER) DEVI 1 Device by Does not apply route 2 (two) times daily. 07/12/18   Bridget Delaine D, PA-C  Dulaglutide (TRULICITY) 2.44 WN/0.2VO SOPN Inject 0.5 mLs into the skin once a week. 10/05/18   Bridget Knapp, MD  ezetimibe (ZETIA) 10 MG tablet TAKE 1 TABLET EVERY DAY 10/07/18   Bridget Guys, MD  furosemide (LASIX) 20 MG tablet Take 1 tablet (20 mg total) by mouth daily. 09/20/18   Bridget Guys, MD  glucose blood (TRUE METRIX BLOOD GLUCOSE TEST) test strip Use as instructed 07/12/18   Bridget Delaine D, PA-C  Insulin Glargine (BASAGLAR KWIKPEN) 100 UNIT/ML SOPN Inject 0.2 mLs (20 Units total) into the skin daily. 10/05/18   Bridget Knapp, MD  Insulin Pen Needle (PEN NEEDLES) 32G X 5 MM MISC 1 Units by Does not apply route daily. 07/08/18   Bridget Knapp, MD  JANUMET 50-1000 MG tablet TAKE 1 TABLET EVERY DAY 10/07/18   Bridget Guys, MD  lisinopril-hydrochlorothiazide [facility]) 20-12.5 MG tablet TAKE 2 TABLETS EVERY DAY  (NEW DOSE) 08/31/18   Bridget Knapp, MD  Multiple Vitamin (MULTIVITAMIN WITH MINERALS) TABS Take 1 tablet by mouth daily.    [provider]  Omega-3 Fatty Acids (FISH OIL) 1000 MG CAPS Take 1 capsule by mouth daily.    [provider]  omeprazole (PRILOSEC) 40 MG capsule Take 40 mg by mouth daily.    [provider]  pravastatin (PRAVACHOL) 20 MG tablet Take 1 tablet (20 mg total) by mouth daily. Patient not taking: Reported on 10/29/2018 08/03/18   Bridget Delaine D, PA-C  TRUEPLUS LANCETS 33G MISC 300 Devices by Does not apply route 2 (two) times daily. 07/12/18   Bridget Latorria Zeoli, PA-C    Family History Family  History  Problem Relation Age of Onset  . Heart disease Mother   . Hypertension Mother   . Cancer Sister   . Diabetes Sister     Social History Social History   Tobacco Use  . Smoking status: Never Smoker  . Smokeless tobacco: Never Used  Substance Use Topics  . Alcohol use: No    Frequency: Never  . Drug use: No     Allergies   Patient has no known allergies.   Review of Systems Review of Systems  Respiratory: Positive for cough.   All other systems reviewed and are negative.    Physical Exam Updated Vital Signs BP (!) 150/70 (BP Location: Left Arm)   Pulse (!) 107   Temp 98.4 F (36.9 C) (Oral)   Resp 18   Ht 5\' 3"  (1.6 m)   Wt 83.9 kg   SpO2 99%   BMI 32.77 kg/m   Physical Exam Vitals signs and nursing note reviewed.  Constitutional:      General: She is not in acute distress.    Appearance: She is well-developed. She is not diaphoretic.  HENT:     Head: Normocephalic and atraumatic.  Neck:     Musculoskeletal: Normal range of motion and neck supple.  Cardiovascular:     Rate and Rhythm: Normal rate and regular rhythm.     Heart sounds: No murmur. No friction rub. No gallop.   Pulmonary:     Effort: Pulmonary effort is normal. No respiratory distress.     Breath sounds: Normal breath sounds. No wheezing.  Abdominal:     General: Bowel sounds are normal. There is no distension.     Palpations: Abdomen is soft.     Tenderness: There is no abdominal tenderness.  Musculoskeletal: Normal range of motion.  Skin:    General: Skin is warm and dry.  Neurological:     Mental Status: She is alert and oriented to person, place, and time.      ED Treatments / Results  Labs (all labs ordered are listed, but only abnormal results are displayed) Labs Reviewed  BASIC METABOLIC PANEL  BRAIN NATRIURETIC PEPTIDE  CBC WITH DIFFERENTIAL/PLATELET  I-STAT TROPONIN, ED    EKG None  Radiology Dg Chest 2 View  Result Date: 11/01/2018 CLINICAL DATA:   Shortness of breath. EXAM: CHEST - 2 VIEW COMPARISON:  09/20/2018. FINDINGS: Mediastinum hilar structures normal. Lungs are clear of acute infiltrates. No pleural effusion or pneumothorax. Heart size normal. Degenerative changes scoliosis thoracic spine. Surgical clips right upper quadrant. IMPRESSION: No acute cardiopulmonary disease. Electronically Signed   By: Bridget Moores  Butler   On: 11/01/2018 14:43    Procedures Procedures (including critical care time)  Medications Ordered in ED Medications - No data to display   Initial Impression / Assessment and Plan / ED Course  I have reviewed the triage vital signs and the nursing notes.  Pertinent labs & imaging results that were available during my care of the patient were reviewed by me and considered in my medical decision making (see chart for details).  Patient presents here with a several week history of wheezing, shortness of breath, and cough.  The etiology of this I am uncertain.  There is no evidence for pneumonia or other finding on her chest x-ray.  Her cardiac work-up is unremarkable.  Her BNP is normal and EKG shows a normal sinus rhythm.  Patient will be treated with an albuterol MDI, prednisone, and antibiotics in case she has bronchitis.  She is to follow-up with primary doctor in the next week if not improving.  Final Clinical Impressions(s) / ED Diagnoses   Final diagnoses:  None    ED Discharge Orders    None       Veryl Speak, MD 11/01/18 [phone]

## 2018-11-01 NOTE — Discharge Instructions (Addendum)
Prednisone and Zithromax as prescribed.  Albuterol inhaler: 2 puffs every 4 hours as needed for wheezing.  Follow-up with your primary doctor if not improving in the next week, and return to the ER if symptoms significantly worsen or change.

## 2018-11-02 NOTE — Telephone Encounter (Signed)
Was seen in the ER today for evaluation.

## 2018-11-16 ENCOUNTER — Encounter: Payer: Self-pay | Admitting: Internal Medicine

## 2018-11-29 ENCOUNTER — Encounter (HOSPITAL_COMMUNITY)
Admission: RE | Admit: 2018-11-29 | Discharge: 2018-11-29 | Disposition: A | Discharge status: Home / Self Care | Payer: Medicare HMO | Source: Ambulatory Visit | Attending: Internal Medicine | Admitting: Internal Medicine

## 2018-11-29 DIAGNOSIS — E042 Nontoxic multinodular goiter: Secondary | ICD-10-CM | POA: Insufficient documentation

## 2018-11-29 DIAGNOSIS — E0590 Thyrotoxicosis, unspecified without thyrotoxic crisis or storm: Secondary | ICD-10-CM | POA: Insufficient documentation

## 2018-11-29 MED ORDER — SODIUM IODIDE I-123 7.4 MBQ CAPS
443.8000 | ORAL_CAPSULE | Freq: Once | ORAL | Status: AC
  Administered 2018-11-29: 443.8 via ORAL

## 2018-11-30 ENCOUNTER — Encounter (HOSPITAL_COMMUNITY)
Admission: RE | Admit: 2018-11-30 | Discharge: 2018-11-30 | Disposition: A | Discharge status: Home / Self Care | Payer: Medicare HMO | Source: Ambulatory Visit | Attending: Internal Medicine | Admitting: Internal Medicine

## 2018-11-30 DIAGNOSIS — E042 Nontoxic multinodular goiter: Secondary | ICD-10-CM | POA: Diagnosis not present

## 2018-12-06 ENCOUNTER — Other Ambulatory Visit: Payer: Self-pay | Admitting: Family Medicine

## 2018-12-06 DIAGNOSIS — Z794 Long term (current) use of insulin: Principal | ICD-10-CM

## 2018-12-06 DIAGNOSIS — IMO0002 Reserved for concepts with insufficient information to code with codable children: Secondary | ICD-10-CM

## 2018-12-06 DIAGNOSIS — E1165 Type 2 diabetes mellitus with hyperglycemia: Secondary | ICD-10-CM

## 2018-12-06 MED ORDER — SITAGLIPTIN PHOS-METFORMIN HCL 50-1000 MG PO TABS: 1.0000 | ORAL_TABLET | Freq: Every day | ORAL | 1 refills | Status: DC

## 2018-12-06 MED ORDER — LISINOPRIL-HYDROCHLOROTHIAZIDE 20-12.5 MG PO TABS: ORAL_TABLET | ORAL | 1 refills | Status: DC

## 2018-12-06 NOTE — Telephone Encounter (Signed)
Copied from [place] [phone]. Topic: Quick Communication - Rx Refill/Question >> Dec 06, 2018  9:00 AM Burchel, Abbi R wrote: Medication:  JANUMET 50-1000 MG tablet, lisinopril-hydrochlorothiazide (PRINZIDE,ZESTORETIC) 20-12.5 MG tablet  Preferred Pharmacy: [facility], [place] [address] Phone: [phone] Fax: [phone] Not a 24 hour pharmacy; exact hours not known.  Agent: Please be advised that RX refills may take up to 3 business days. We ask that you follow-up with your pharmacy.

## 2019-01-04 ENCOUNTER — Other Ambulatory Visit: Payer: Self-pay

## 2019-01-04 ENCOUNTER — Telehealth: Payer: Self-pay | Admitting: Family Medicine

## 2019-01-04 DIAGNOSIS — E1165 Type 2 diabetes mellitus with hyperglycemia: Secondary | ICD-10-CM

## 2019-01-04 DIAGNOSIS — Z794 Long term (current) use of insulin: Principal | ICD-10-CM

## 2019-01-04 DIAGNOSIS — IMO0002 Reserved for concepts with insufficient information to code with codable children: Secondary | ICD-10-CM

## 2019-01-04 MED ORDER — BASAGLAR KWIKPEN 100 UNIT/ML ~~LOC~~ SOPN: 20.0000 [IU] | PEN_INJECTOR | Freq: Every day | SUBCUTANEOUS | 1 refills | Status: DC

## 2019-01-04 MED ORDER — DULAGLUTIDE 0.75 MG/0.5ML ~~LOC~~ SOAJ: 0.5000 mL | SUBCUTANEOUS | 0 refills | Status: DC

## 2019-01-04 NOTE — Telephone Encounter (Signed)
Copied from [place] [phone]. Topic: Quick Communication - Rx Refill/Question >> Jan 04, 2019 11:40 AM Keene Breath wrote: Medication: Dulaglutide (TRULICITY) 3.96 DS/8.9TV SOPN / Insulin Glargine (BASAGLAR KWIKPEN) 100 UNIT/ML SOPN  Patient called to request a refill for the above medication.  Preferred Pharmacy (with phone number or street name): [facility], [place] [phone] (Phone) [phone] (Fax)

## 2019-01-04 NOTE — Telephone Encounter (Signed)
Rx sent to pharmacy   

## 2019-01-07 ENCOUNTER — Other Ambulatory Visit: Payer: Self-pay | Admitting: Family Medicine

## 2019-01-07 DIAGNOSIS — E1165 Type 2 diabetes mellitus with hyperglycemia: Secondary | ICD-10-CM

## 2019-01-07 DIAGNOSIS — IMO0002 Reserved for concepts with insufficient information to code with codable children: Secondary | ICD-10-CM

## 2019-01-07 DIAGNOSIS — Z794 Long term (current) use of insulin: Principal | ICD-10-CM

## 2019-01-12 ENCOUNTER — Other Ambulatory Visit: Payer: Self-pay | Admitting: Family Medicine

## 2019-01-12 DIAGNOSIS — IMO0002 Reserved for concepts with insufficient information to code with codable children: Secondary | ICD-10-CM

## 2019-01-12 DIAGNOSIS — Z794 Long term (current) use of insulin: Principal | ICD-10-CM

## 2019-01-12 DIAGNOSIS — E1165 Type 2 diabetes mellitus with hyperglycemia: Secondary | ICD-10-CM

## 2019-01-12 NOTE — Telephone Encounter (Signed)
Please see pharmacy comment regarding requested changes. Lantus Solostar copay [id number]

## 2019-02-28 ENCOUNTER — Telehealth: Payer: Self-pay | Admitting: Family Medicine

## 2019-02-28 MED ORDER — INSULIN GLARGINE 100 UNIT/ML SOLOSTAR PEN: 20.0000 [IU] | PEN_INJECTOR | Freq: Every day | SUBCUTANEOUS | 0 refills | Status: DC

## 2019-02-28 NOTE — Telephone Encounter (Signed)
This patient is transferring to you on 03/28/2019 as a transfer care but she want to see if you could change her back to lantus medication and send in enough till her appt with you on 03/28/2019.

## 2019-02-28 NOTE — Telephone Encounter (Unsigned)
Copied from [place] [id number]. Topic: Quick Communication - Rx Refill/Question >> Feb 28, 2019  9:53 AM Alanda Slim E wrote: Medication: Pt would like to go back to taking Insulin Glargine (LANTUS SOLOSTAR) 100 UNIT/ML Solostar Pen instead of the current Insulin Glargine (BASAGLAR KWIKPEN) 100 UNIT/ML SOPN due to the cost. Pt trial period is over and it will cost about $900 every 3 months / please advise   Has the patient contacted their pharmacy?  yes  Preferred Pharmacy (with phone number or street name): [facility], [place] [phone] (Phone) [phone] (Fax)    Agent: Please be advised that RX refills may take up to 3 business days. We ask that you follow-up with your pharmacy.

## 2019-02-28 NOTE — Telephone Encounter (Signed)
done

## 2019-03-03 ENCOUNTER — Ambulatory Visit: Payer: Medicare HMO | Admitting: Internal Medicine

## 2019-03-28 ENCOUNTER — Encounter: Payer: Medicare HMO | Admitting: Family Medicine

## 2019-03-29 ENCOUNTER — Telehealth: Payer: Self-pay | Admitting: Family Medicine

## 2019-03-29 ENCOUNTER — Other Ambulatory Visit: Payer: Self-pay

## 2019-03-29 DIAGNOSIS — IMO0002 Reserved for concepts with insufficient information to code with codable children: Secondary | ICD-10-CM

## 2019-03-29 DIAGNOSIS — E1165 Type 2 diabetes mellitus with hyperglycemia: Secondary | ICD-10-CM

## 2019-03-29 MED ORDER — DULAGLUTIDE 0.75 MG/0.5ML ~~LOC~~ SOAJ: 0.5000 mL | SUBCUTANEOUS | 0 refills | Status: DC

## 2019-03-29 NOTE — Telephone Encounter (Unsigned)
Copied from [place] [phone] [phone]. Topic: Quick Communication - Rx Refill/Question >> Mar 29, 2019  1:57 PM Alanda Slim E wrote: Medication: Dulaglutide (TRULICITY) 4.65 KP/5.4SF SOPN - Pt would only like 1 box of 4 pens at a time due to the cost/ please advise   Has the patient contacted their pharmacy? Yes - call PCP  Preferred Pharmacy (with phone number or street name): [facility], [place] [phone] (Phone) [phone] (Fax)    Agent: Please be advised that RX refills may take up to 3 business days. We ask that you follow-up with your pharmacy.

## 2019-03-29 NOTE — Telephone Encounter (Signed)
Rx sent to pharmacy   

## 2019-03-31 ENCOUNTER — Other Ambulatory Visit: Payer: Self-pay

## 2019-04-04 ENCOUNTER — Ambulatory Visit (INDEPENDENT_AMBULATORY_CARE_PROVIDER_SITE_OTHER): Payer: Medicare HMO | Admitting: Internal Medicine

## 2019-04-04 ENCOUNTER — Other Ambulatory Visit: Payer: Self-pay

## 2019-04-04 ENCOUNTER — Encounter: Payer: Self-pay | Admitting: Internal Medicine

## 2019-04-04 VITALS — BP 120/78 | HR 90 | Ht 63.0 in | Wt 188.0 lb

## 2019-04-04 DIAGNOSIS — E042 Nontoxic multinodular goiter: Secondary | ICD-10-CM

## 2019-04-04 DIAGNOSIS — E0590 Thyrotoxicosis, unspecified without thyrotoxic crisis or storm: Secondary | ICD-10-CM | POA: Diagnosis not present

## 2019-04-04 NOTE — Progress Notes (Signed)
Patient ID: Bridget Butler, female   DOB: 1946-06-04, 73 y.o.   MRN: [id number]    HPI  Bridget Butler is a 73 y.o.-year-old female, initially referred by her PCP, Dr. Brigitte Pulse, returning for follow-up for subclinical thyrotoxicosis.  Last visit 5 months ago.  She has a h/o hyperthyroidism since 2010. She was seeing Dr. Jeanann Lewandowsky - he retired. She was started on medication (cannot remember name) >> felt "off" >> stopped in ~2010.  Review TFTs: Lab Results  Component Value Date   TSH 0.03 (L) 10/29/2018   TSH 0.054 (L) 07/08/2018   TSH 0.118 (L) 03/29/2018   FREET4 0.97 10/29/2018   FREET4 1.05 07/08/2018   T3FREE 3.8 10/29/2018   T3FREE 3.3 07/08/2018   Her TSI antibodies were not elevated: Lab Results  Component Value Date   TSI <89 10/29/2018    Thyroid uptake (08/28/2007):  There is heterogeneous uptake within a normal sized gland. There are regions of photopenia within the bilateral upper poles and in the left lower pole. The most intense uptake is in the mid pole of the right lobe of the thyroid gland. Decreased uptake in the inferior pole of the right lobe of the thyroid gland additionally.   Calculated 24-hour I-131 uptake is equal to 25.0%, which is normal (normal 10-30%).   IMPRESSION:   1. Heterogeneous uptake within the thyroid gland could represent resolving subacute thyroiditis in patient with a normal uptake.  2. Cannot exclude a multinodular goiter if TSH continues to remain depressed  Labs at last visit, we obtained a thyroid uptake and scan (11/30/2018): normal uptake, but multinodular goiter with warm nodules at inferior thyroid poles: 4 hour I-123 uptake = 8.6% (normal 5-20%) 24 hour I-123 uptake = 25.3% (normal 10-30%)  IMPRESSION: Normal 4 hour and 24 hour radio iodine uptakes.  Multinodular thyroid gland with warm nodules at the inferior poles of both thyroid lobes, corresponding to dominant masses identified on a remote thyroid ultrasound consistent with  adenomas.  Pt denies: - weight loss - heat intolerance - tremors - palpitations - anxiety - hyperdefecation - hair loss  Pt does not have a FH of thyroid ds. No FH of thyroid cancer. No h/o radiation tx to head or neck.  No seaweed or kelp. No recent contrast studies. No herbal supplements. No Biotin use. No recent steroids use.  Pt. has a history of multinodular goiter per ultrasound from 06/28/2013.  Biopsies were benign on 07/13/2013.  Pt denies: - feeling nodules in neck - hoarseness - dysphagia - choking - SOB with lying down  She also has controlled diabetes Lab Results  Component Value Date   HGBA1C 7.0 (H) 07/08/2018   ROS: Constitutional: no weight gain/no weight loss, no fatigue, no subjective hyperthermia, no subjective hypothermia, + nocturia Eyes: no blurry vision, no xerophthalmia ENT: no sore throat, + see HPI, + hypoacusis Cardiovascular: no CP/no SOB/no palpitations/no leg swelling Respiratory: no cough/no SOB/+ wheezing Gastrointestinal: no N/no V/no D/no C/+ acid reflux Musculoskeletal: no muscle aches/no joint aches Skin: no rashes, no hair loss Neurological: no tremors/no numbness/no tingling/no dizziness  I reviewed pt's medications, allergies, PMH, social hx, family hx, and changes were documented in the history of present illness. Otherwise, unchanged from my initial visit note.  Past Medical History:  Diagnosis Date  . Cataract   . Diabetes mellitus without complication ([place])   . Thyroid disease    Past Surgical History:  Procedure Laterality Date  . COSMETIC SURGERY    .  GALLBLADDER SURGERY    . TUBAL LIGATION     Social History   Socioeconomic History  . Marital status: Married    Spouse name: Not on file  . Number of children: 3  . Years of education: Not on file  . Highest education level: Not on file  Occupational History  . Not on file  Social Needs  . Financial resource strain: Not on file  . Food insecurity    Worry: Not  on file    Inability: Not on file  . Transportation needs    Medical: Not on file    Non-medical: Not on file  Tobacco Use  . Smoking status: Never Smoker  . Smokeless tobacco: Never Used  Substance and Sexual Activity  . Alcohol use: No    Frequency: Never  . Drug use: No  . Sexual activity: Not on file  Lifestyle  . Physical activity    Days per week: Not on file    Minutes per session: Not on file  . Stress: Not on file  Relationships  . Social Herbalist on phone: Not on file    Gets together: Not on file    Attends religious service: Not on file    Active member of club or organization: Not on file    Attends meetings of clubs or organizations: Not on file    Relationship status: Not on file  . Intimate partner violence    Fear of current or ex partner: Not on file    Emotionally abused: Not on file    Physically abused: Not on file    Forced sexual activity: Not on file  Other Topics Concern  . Not on file  Social History Narrative  . Not on file   Current Outpatient Medications on File Prior to Visit  Medication Sig Dispense Refill  . aspirin EC 81 MG tablet Take 81 mg by mouth daily.    [name] Kitchen azithromycin (ZITHROMAX Z-PAK) 250 MG tablet 2 po day one, then 1 daily x 4 days 6 tablet 0  . Blood Glucose Monitoring Suppl (TRUE METRIX METER) DEVI 1 Device by Does not apply route 2 (two) times daily. 1 Device 0  . Dulaglutide (TRULICITY) 9.41 DE/0.8XK SOPN Inject 0.5 mLs into the skin once a week. 4 pen 0  . ezetimibe (ZETIA) 10 MG tablet TAKE 1 TABLET EVERY DAY 90 tablet 0  . furosemide (LASIX) 20 MG tablet Take 1 tablet (20 mg total) by mouth daily. 5 tablet 0  . glucose blood (TRUE METRIX BLOOD GLUCOSE TEST) test strip Use as instructed 300 each 1  . Insulin Glargine (LANTUS SOLOSTAR) 100 UNIT/ML Solostar Pen Inject 20 Units into the skin at bedtime. 5 pen 0  . Insulin Pen Needle (PEN NEEDLES) 32G X 5 MM MISC 1 Units by Does not apply route daily. 100 each 5   . lisinopril-hydrochlorothiazide (PRINZIDE,ZESTORETIC) 20-12.5 MG tablet TAKE 2 TABLETS EVERY DAY  (NEW DOSE) 180 tablet 1  . omeprazole (PRILOSEC) 40 MG capsule Take 40 mg by mouth daily.    . pravastatin (PRAVACHOL) 20 MG tablet Take 1 tablet (20 mg total) by mouth daily. (Patient not taking: Reported on 11/01/2018) 90 tablet 0  . predniSONE (DELTASONE) 10 MG tablet Take 2 tablets (20 mg total) by mouth 2 (two) times daily with a meal. 20 tablet 0  . sitaGLIPtin-metformin (JANUMET) 50-1000 MG tablet Take 1 tablet by mouth daily. 90 tablet 1  . Tetrahydrozoline HCl (VISINE OP)  Apply 1 drop to eye daily as needed (dry eyes).    . TRUEPLUS LANCETS 33G MISC 300 Devices by Does not apply route 2 (two) times daily. 300 each 1   No current facility-administered medications on file prior to visit.    No Known Allergies Family History  Problem Relation Age of Onset  . Heart disease Mother   . Hypertension Mother   . Cancer Sister   . Diabetes Sister    PE: BP 120/78   Pulse 90   Ht 5\' 3"  (1.6 m)   Wt 188 lb (85.3 kg)   SpO2 98%   BMI 33.30 kg/m   Wt Readings from Last 3 Encounters:  04/04/19 188 lb (85.3 kg)  11/01/18 185 lb (83.9 kg)  09/20/18 192 lb 9.6 oz (87.4 kg)   Constitutional: overweight, in NAD Eyes: PERRLA, EOMI, no exophthalmos ENT: moist mucous membranes, no thyromegaly, no cervical lymphadenopathy Cardiovascular: RRR, No MRG Respiratory: no rhonchi, crackles, but B wheezes  Gastrointestinal: abdomen soft, NT, ND, BS+ Musculoskeletal: no deformities, strength intact in all 4 Skin: moist, warm, no rashes Neurological: no tremor with outstretched hands, DTR normal in all 4  ASSESSMENT: 1. Thyrotoxicosis  2. Multiple thyroid nodules  PLAN:  1. Patient with a long history of low TSH, without thyrotoxic symptoms; no weight loss, heat intolerance, hyper defecation, palpitations, anxiety.  She has no apparent exogenous causes for her low TSH.   -At last visit, we  discussed the possible causes of thyrotoxicosis and Graves' disease, thyroiditis, toxic multinodular goiter/toxic adenoma. -At last visit we checked a thyroid uptake and scan and, while the uptake was normal, this showed a slight toxic nodular goiter, with more adenomas in the inferior thyroid lobes. -We did discuss about possible long-term consequences of uncontrolled thyrotoxicosis, but, in her case, she was asymptomatic and her thyrotoxicosis was subclinical (although at last visit TSH was slightly worse), so we decided to not intervene but just follow her TFTs.  -We again discussed about the fact that the thyrotoxicosis may worsen, and in that case, she will probably need either radioactive iodine ablation or, last resort, surgery.  Methimazole is another option but is not definitive treatment -At today's visit, I plans to repeat her TFTs but she took a B complex today containing 300 mcg biotin.  Therefore, she will return for labs after staying off the B complex for 2 days.   -she is not on beta blockers -at today's visit her pulse is 90 and she mentions that this is usually what she sees at home.  Reviewing her chart, a year ago her pulse was also high at appointment with PCP, but 2 years ago it was 58.  I will check her TFTs today and if they are worse, we may need to start a beta-blocker.  She agrees with this. -I will see her back in 6 months  2. Multiple thyroid nodules -she denies neck compression symptoms -The 2 dominant nodules were biopsied with benign results in 2014 -Per review of her most recent uptake and scan, she has warm nodules in the lower poles of her thyroid -no indication for rebiopsy -We will continue to follow her clinically  Orders Placed This Encounter  Procedures  . TSH  . T4, free  . T3, free   Philemon Kingdom, MD PhD [facility]

## 2019-04-04 NOTE — Patient Instructions (Addendum)
Please come back for labs  - off the Biotin for at least 2 days.  Please return in 6 months.

## 2019-04-11 ENCOUNTER — Other Ambulatory Visit (INDEPENDENT_AMBULATORY_CARE_PROVIDER_SITE_OTHER): Payer: Medicare HMO

## 2019-04-11 ENCOUNTER — Other Ambulatory Visit: Payer: Self-pay

## 2019-04-11 DIAGNOSIS — E0590 Thyrotoxicosis, unspecified without thyrotoxic crisis or storm: Secondary | ICD-10-CM

## 2019-04-11 LAB — T4, FREE: Free T4: 0.84 ng/dL (ref 0.60–1.60)

## 2019-04-11 LAB — T3, FREE: T3, Free: 3 pg/mL (ref 2.3–4.2)

## 2019-04-11 LAB — TSH: TSH: 0.03 u[IU]/mL — ABNORMAL LOW (ref 0.35–4.50)

## 2019-04-15 ENCOUNTER — Telehealth: Payer: Self-pay

## 2019-04-15 NOTE — Telephone Encounter (Signed)
Spoke with patient and went over the options listed by Dr. Cruzita Lederer.  Patient would like to review both options before she decides and will call me back next week.

## 2019-04-15 NOTE — Telephone Encounter (Signed)
-----   Message from Philemon Kingdom, MD sent at 04/13/2019  2:43 PM EDT ----- Lenna Sciara, can you please call pt: Her TFTs are similar to before, not worse, but they have not improved further. At this point, I would suggest radioactive iodine treatment (I discussed with her at the time of the visit what this means).  Second option would be to start a low-dose methimazole and recheck her test in 5 to 6 weeks.  We can also continue to follow her without medications, but in the light of her rapid pulse, I would probably not suggest that for now.  Please let me know what she decides.

## 2019-04-18 ENCOUNTER — Other Ambulatory Visit: Payer: Medicare HMO

## 2019-04-19 ENCOUNTER — Other Ambulatory Visit: Payer: Medicare HMO

## 2019-04-21 ENCOUNTER — Encounter: Payer: Medicare HMO | Admitting: Family Medicine

## 2019-05-26 ENCOUNTER — Telehealth: Payer: Self-pay | Admitting: Family Medicine

## 2019-05-26 NOTE — Telephone Encounter (Signed)
Patient states she is out of lisinopril

## 2019-05-27 MED ORDER — LISINOPRIL-HYDROCHLOROTHIAZIDE 20-12.5 MG PO TABS: ORAL_TABLET | ORAL | 0 refills | Status: DC

## 2019-05-27 NOTE — Addendum Note (Signed)
Addended by: Kittie Plater, Maddon Horton HUA on: 05/27/2019 11:55 AM   Modules accepted: Orders

## 2019-05-27 NOTE — Telephone Encounter (Signed)
Pt stated rx needs to go to [facility] as she no longer uses [facility]. Please advise.  lisinopril-hydrochlorothiazide (ZESTORETIC) 20-12.5 MG tablet

## 2019-05-27 NOTE — Telephone Encounter (Signed)
Call placed to pt. To discuss her needs with Lisinopril-HCTZ.  Questioned if the Rx gets changed to the mail order pharmacy, would she need a limited amt. To be ordered at her local pharmacy, until the mail order arrives?  Pt. Stated she changed her mind, and will keep the current Rx at [facility], and then she will change to [facility] with the next refill request.  Reminded pt. To keep her f/u appt. With Dr. Pamella Pert on 06/03/19.  Pt. Agreed.  Denied any further needs at this time.

## 2019-05-27 NOTE — Telephone Encounter (Addendum)
I have attempted to call pt and inform her that we have just sent her rx to [facility] for she stated that she was out of medication.  No answer so I left a general message to call back

## 2019-05-31 DIAGNOSIS — Z1231 Encounter for screening mammogram for malignant neoplasm of breast: Secondary | ICD-10-CM | POA: Diagnosis not present

## 2019-05-31 DIAGNOSIS — Z803 Family history of malignant neoplasm of breast: Secondary | ICD-10-CM | POA: Diagnosis not present

## 2019-05-31 LAB — HM MAMMOGRAPHY

## 2019-06-03 ENCOUNTER — Encounter: Payer: Self-pay | Admitting: Family Medicine

## 2019-06-03 ENCOUNTER — Other Ambulatory Visit: Payer: Self-pay

## 2019-06-03 ENCOUNTER — Ambulatory Visit (INDEPENDENT_AMBULATORY_CARE_PROVIDER_SITE_OTHER): Payer: Medicare HMO | Admitting: Family Medicine

## 2019-06-03 VITALS — BP 140/65 | HR 90 | Temp 98.4°F | Ht 63.0 in | Wt 191.4 lb

## 2019-06-03 DIAGNOSIS — M8589 Other specified disorders of bone density and structure, multiple sites: Secondary | ICD-10-CM | POA: Diagnosis not present

## 2019-06-03 DIAGNOSIS — E559 Vitamin D deficiency, unspecified: Secondary | ICD-10-CM

## 2019-06-03 DIAGNOSIS — I10 Essential (primary) hypertension: Secondary | ICD-10-CM

## 2019-06-03 DIAGNOSIS — E785 Hyperlipidemia, unspecified: Secondary | ICD-10-CM

## 2019-06-03 DIAGNOSIS — IMO0002 Reserved for concepts with insufficient information to code with codable children: Secondary | ICD-10-CM

## 2019-06-03 DIAGNOSIS — Z794 Long term (current) use of insulin: Secondary | ICD-10-CM

## 2019-06-03 DIAGNOSIS — E1165 Type 2 diabetes mellitus with hyperglycemia: Secondary | ICD-10-CM | POA: Diagnosis not present

## 2019-06-03 MED ORDER — PRAVASTATIN SODIUM 20 MG PO TABS: 20.0000 mg | ORAL_TABLET | Freq: Every day | ORAL | 3 refills | Status: DC

## 2019-06-03 MED ORDER — TRULICITY 0.75 MG/0.5ML ~~LOC~~ SOAJ: 0.5000 mL | SUBCUTANEOUS | 0 refills | Status: DC

## 2019-06-03 NOTE — Progress Notes (Signed)
8/14/20204:14 PM  Bridget Butler 06-26-1946, 73 y.o., female [id number]  Chief Complaint  Patient presents with  . Medication Refill  . Diabetes  . Transitions Of Care    HPI:   Patient is a 73 y.o. female with past medical history significant for DM2 on insulin, HTN, HLP, osteopenia, vit D deficiency, hyperthyroidism who presents today for [facility]  Previous PCP Dr Brigitte Pulse Last OV Sept 2019  Saw Dr Cruzita Lederer, endo, June 2020 for hyperthyroidism, subclinical, multinodular thyroid, being monitored  Dx with diabetes ~ 25 years ago Has been on insulin for about > 10 years Takes lantus 20 units in the morning Takes janumet at bedtime She cant afford trulicity, last injection was about 10 days ago Checks cbgs every morning, today 78, this is rare, usually around 100 Occasionally has nocturnal lows, with sx Struggling to pay her DM meds, has hit the donut hole  Takes lisinopril-hctz for BP Tolerating well BP Readings from Last 3 Encounters:  06/03/19 140/65  04/04/19 120/78  11/01/18 138/60    Ran out of her pravastatin about 3 months ago She was tolerating well Lab Results  Component Value Date   CHOL 222 (H) 12/29/2017   HDL 64 12/29/2017   LDLCALC 133 (H) 12/29/2017   TRIG 127 12/29/2017   CHOLHDL 3.5 12/29/2017    Depression screen PHQ 2/9 06/03/2019 09/20/2018 03/29/2018  Decreased Interest 0 0 0  Down, Depressed, Hopeless 0 0 0  PHQ - 2 Score 0 0 0    Fall Risk  06/03/2019 09/20/2018 03/29/2018 02/12/2018 12/29/2017  Falls in the past year? 0 0 No No No  Number falls in past yr: 0 - - - -  Injury with Fall? 0 - - - -  Follow up Falls evaluation completed - - - -     No Known Allergies  Prior to Admission medications   Medication Sig Start Date End Date Taking? Authorizing Provider  aspirin EC 81 MG tablet Take 81 mg by mouth daily.   Yes [provider]  Blood Glucose Monitoring Suppl (TRUE METRIX METER) DEVI 1 Device by Does not apply route 2 (two) times  daily. 07/12/18  Yes Timmothy Euler, [place] D, PA-C  glucose blood (TRUE METRIX BLOOD GLUCOSE TEST) test strip Use as instructed 07/12/18  Yes Timmothy Euler, [place] D, PA-C  Insulin Glargine (LANTUS SOLOSTAR) 100 UNIT/ML Solostar Pen Inject 20 Units into the skin at bedtime. 02/28/19  Yes Rutherford Guys, MD  Insulin Pen Needle (PEN NEEDLES) 32G X 5 MM MISC 1 Units by Does not apply route daily. 07/08/18  Yes Shawnee Knapp, MD  lisinopril-hydrochlorothiazide (ZESTORETIC) 20-12.5 MG tablet TAKE 2 TABLETS EVERY DAY  (NEW DOSE) 05/27/19  Yes Rutherford Guys, MD  omeprazole (PRILOSEC) 40 MG capsule Take 40 mg by mouth daily.   Yes [provider]  sitaGLIPtin-metformin (JANUMET) 50-1000 MG tablet Take 1 tablet by mouth daily. 12/06/18  Yes Shawnee Knapp, MD  Tetrahydrozoline HCl (VISINE OP) Apply 1 drop to eye daily as needed (dry eyes).   Yes [provider]  Dulaglutide (TRULICITY) 3.14 HF/0.2OV SOPN Inject 0.5 mLs into the skin once a week. Patient not taking: Reported on 06/03/2019 03/29/19   Forrest Moron, MD  pravastatin (PRAVACHOL) 20 MG tablet Take 1 tablet (20 mg total) by mouth daily. Patient not taking: Reported on 11/01/2018 08/03/18   Donzetta Kohut    Past Medical History:  Diagnosis Date  . Cataract   . Diabetes mellitus  without complication ([place])   . Thyroid disease     Past Surgical History:  Procedure Laterality Date  . COSMETIC SURGERY    . GALLBLADDER SURGERY    . TUBAL LIGATION      Social History   Tobacco Use  . Smoking status: Never Smoker  . Smokeless tobacco: Never Used  Substance Use Topics  . Alcohol use: No    Frequency: Never    Family History  Problem Relation Age of Onset  . Heart disease Mother   . Hypertension Mother   . Cancer Sister   . Diabetes Sister     Review of Systems  Constitutional: Negative for chills and fever.  Respiratory: Negative for cough and shortness of breath.   Cardiovascular: Negative for chest pain,  palpitations and leg swelling.  Gastrointestinal: Negative for abdominal pain, nausea and vomiting.     OBJECTIVE:  Today's Vitals   06/03/19 1543  BP: 140/65  Pulse: 90  Temp: 98.4 F (36.9 C)  TempSrc: Oral  SpO2: 98%  Weight: 191 lb 6.4 oz (86.8 kg)  Height: 5\' 3"  (1.6 m)   Body mass index is 33.9 kg/m.   Physical Exam Vitals signs and nursing note reviewed.  Constitutional:      Appearance: She is well-developed.  HENT:     Head: Normocephalic and atraumatic.     Mouth/Throat:     Pharynx: No oropharyngeal exudate.  Eyes:     General: No scleral icterus.    Conjunctiva/sclera: Conjunctivae normal.     Pupils: Pupils are equal, round, and reactive to light.  Neck:     Musculoskeletal: Neck supple.  Cardiovascular:     Rate and Rhythm: Normal rate and regular rhythm.     Heart sounds: Normal heart sounds. No murmur. No friction rub. No gallop.   Pulmonary:     Effort: Pulmonary effort is normal.     Breath sounds: Normal breath sounds. No wheezing or rales.  Musculoskeletal:     Right lower leg: No edema.     Left lower leg: No edema.  Skin:    General: Skin is warm and dry.  Neurological:     Mental Status: She is alert and oriented to person, place, and time.     Diabetic Foot Exam - Simple   Simple Foot Form Diabetic Foot exam was performed with the following findings: Yes 06/03/2019  3:45 PM  Visual Inspection No deformities, no ulcerations, no other skin breakdown bilaterally: Yes See comments: Yes Sensation Testing Intact to touch and monofilament testing bilaterally: Yes See comments: Yes Pulse Check Posterior Tibialis and Dorsalis pulse intact bilaterally: Yes Comments Numbeness and tingling when sleeping.   Could not feel in the heel        ASSESSMENT and PLAN  1. Insulin dependent type 2 diabetes mellitus, uncontrolled ([place]) Checking labs today, medications will be adjusted as needed. Referral to [facility] for patient medication  assistance made. - Hemoglobin A1c - Dulaglutide (TRULICITY) 4.31 VQ/0.0QQ SOPN; Inject 0.5 mLs into the skin once a week. - Microalbumin / creatinine urine ratio; Future  2. Hyperlipidemia LDL goal <100 Restarted pravastatin Checking labs today, medications will be adjusted as needed.  - Comprehensive metabolic panel  3. Essential hypertension Controlled. Continue current regime.  - Lipid panel  4. Osteopenia of multiple sites dexa 2019, continue with LFM - Vitamin D, 25-hydroxy  5. Vitamin D deficiency - Vitamin D, 25-hydroxy  Other orders - AMB Referral to [place] [facility] - pravastatin (PRAVACHOL)  20 MG tablet; Take 1 tablet (20 mg total) by mouth daily.  Return in about 3 months (around 09/03/2019).    Rutherford Guys, MD Primary Care at [facility] [place], [place] [address] Ph.  [phone] Fax [phone]

## 2019-06-03 NOTE — Patient Instructions (Addendum)
1. Take janumet in the morning with breakfast 2. Take lantus at bedtime, fasting sugar should be between 90-130, if recurring low decrease lantus dose by 2 units until lows resolve      If you have lab work done today you will be contacted with your lab results within the next 2 weeks.  If you have not heard from [place] then please contact us. The fastest way to get your results is to register for My Chart.   IF you received an x-ray today, you will receive an invoice from [facility]. Please contact [facility] at [phone] with questions or concerns regarding your invoice.   IF you received labwork today, you will receive an invoice from [place]. Please contact [facility] at [phone] with questions or concerns regarding your invoice.   Our billing staff will not be able to assist you with questions regarding bills from these companies.  You will be contacted with the lab results as soon as they are available. The fastest way to get your results is to activate your My Chart account. Instructions are located on the last page of this paperwork. If you have not heard from [place] regarding the results in 2 weeks, please contact this office.

## 2019-06-04 LAB — COMPREHENSIVE METABOLIC PANEL
ALT: 12 IU/L (ref 0–32)
AST: 16 IU/L (ref 0–40)
Albumin/Globulin Ratio: 1.6 (ref 1.2–2.2)
Albumin: 4.2 g/dL (ref 3.7–4.7)
Alkaline Phosphatase: 69 IU/L (ref 39–117)
BUN/Creatinine Ratio: 17 (ref 12–28)
BUN: 19 mg/dL (ref 8–27)
Bilirubin Total: <0.2 mg/dL (ref 0.0–1.2)
CO2: 25 mmol/L (ref 20–29)
Calcium: 9.9 mg/dL (ref 8.7–10.3)
Chloride: 99 mmol/L (ref 96–106)
Creatinine, Ser: 1.1 mg/dL — ABNORMAL HIGH (ref 0.57–1.00)
GFR calc Af Amer: 58 mL/min/{1.73_m2} — ABNORMAL LOW (ref >59)
GFR calc non Af Amer: 50 mL/min/{1.73_m2} — ABNORMAL LOW (ref >59)
Globulin, Total: 2.6 g/dL (ref 1.5–4.5)
Glucose: 153 mg/dL — ABNORMAL HIGH (ref 65–99)
Potassium: 4.4 mmol/L (ref 3.5–5.2)
Sodium: 138 mmol/L (ref 134–144)
Total Protein: 6.8 g/dL (ref 6.0–8.5)

## 2019-06-04 LAB — HEMOGLOBIN A1C
Est. average glucose Bld gHb Est-mCnc: 137 mg/dL
Hgb A1c MFr Bld: 6.4 % — ABNORMAL HIGH (ref 4.8–5.6)

## 2019-06-04 LAB — MICROALBUMIN / CREATININE URINE RATIO
Creatinine, Urine: 84.2 mg/dL
Microalb/Creat Ratio: 7 mg/g creat (ref 0–29)
Microalbumin, Urine: 6.3 ug/mL

## 2019-06-04 LAB — LIPID PANEL
Chol/HDL Ratio: 4.6 ratio — ABNORMAL HIGH (ref 0.0–4.4)
Cholesterol, Total: 273 mg/dL — ABNORMAL HIGH (ref 100–199)
HDL: 60 mg/dL (ref >39)
LDL Calculated: 148 mg/dL — ABNORMAL HIGH (ref 0–99)
Triglycerides: 325 mg/dL — ABNORMAL HIGH (ref 0–149)
VLDL Cholesterol Cal: 65 mg/dL — ABNORMAL HIGH (ref 5–40)

## 2019-06-04 LAB — VITAMIN D 25 HYDROXY (VIT D DEFICIENCY, FRACTURES): Vit D, 25-Hydroxy: 18.1 ng/mL — ABNORMAL LOW (ref 30.0–100.0)

## 2019-06-08 ENCOUNTER — Encounter: Payer: Self-pay | Admitting: *Deleted

## 2019-06-08 ENCOUNTER — Other Ambulatory Visit: Payer: Self-pay | Admitting: *Deleted

## 2019-06-08 ENCOUNTER — Other Ambulatory Visit: Payer: Self-pay | Admitting: Family Medicine

## 2019-06-08 MED ORDER — LANTUS SOLOSTAR 100 UNIT/ML ~~LOC~~ SOPN: 20.0000 [IU] | PEN_INJECTOR | Freq: Every day | SUBCUTANEOUS | 0 refills | Status: DC

## 2019-06-08 NOTE — Telephone Encounter (Signed)
Medication Refill - Medication: Insulin Glargine (LANTUS SOLOSTAR) 100 UNIT/ML Solostar Pen [[id number]]   Has the patient contacted their pharmacy? No. (Agent: If no, request that the patient contact the pharmacy for the refill.) (Agent: If yes, when and what did the pharmacy advise?)  Preferred Pharmacy (with phone number or street name): [facility]   Agent: Please be advised that RX refills may take up to 3 business days. We ask that you follow-up with your pharmacy.

## 2019-06-08 NOTE — Patient Outreach (Signed)
[facility]) Care Management  06/08/2019  Twylla Arceneaux Desmarais 12-11-1945 [id number]   Telephone Screen  Referral Date: 06/06/19  Referral Source:MD referral   Referral Reason: patient medication assistance program for DM medications-Ray, Sherron- Dr Benay Spice santiago  06/03/19   Insurance: [place] attempt # 1 successful to her home number  Patient is able to verify HIPAA, DOB and address Reviewed and addressed referral to [facility] with patient  Mrs Mancinas does confirms cost concerns with DM medications [facility] assessment completed further and she was educated and offered resources for [facility] RN CM telephonically and Health coach services but reports at this time she does not prefer these services She states she is attempting to meet personal goals she has developed for herself and states she is aware the "ultimately I have to be ready and chose to do it"  Social: Mrs Martelle is a 73 year old married Jehovah witness patient who reports her health as fair. She receives support from her daughter primarily who also assists with transportation to medical appointments. She is presently independent with her ADLs and iADLs   Conditions: HTN, GERD, DM HLD, class 2 severe obesity due to excess calories with serious comorbidity and BMI 35-35.9 (last BM on 06/03/19 was 33.90) wt 191 lb 6.4 oz ht 5'3", abnormal thyroid stimulating hormone level, osteopenia of multiple sites, vitamin deficiency, hypothyroidism  Fall x 1 resulting in a scrape to her  knee & elbow while moving She denies need to be seen by a MD or ED   DME: cbg monitor   Medications:  Mr Bracco gets hr medications via [facility] and prefers to continue this practice   She reports cost concerns with Trulicity, Lantus Solostar q hs and Janumet q am  She cant afford Trulicity, last injection was about 10 days ago Just ordered medication from [facility] and it takes 7 days per pt She reports having enough medicines until the new order  arrives   She can not recall being offered samples nor receiving assist from the social security medication program  A year ago Dr Brigitte Pulse had her fill out a patient medication application for Trulicity and Janumet  Her question to [place] will also be Can I still get all my medications [employer] or does she have to use another source  Appointments: Primary MD Dr Pamella Pert f/u in 3 months 09/03/19 - Last seen on 06/03/19 She states Dr Pamella Pert manages her Rx for DM She previously was seen by Dr Dwana Curd -transitioned to Dr Pamella Pert on 06/03/19   Endocrinology Dr Philemon Kingdom f/u 10/04/19 10 am Last seen in June 2020 for hypothyroidism  Advance Directives: She reports having a living will and POA, Daughter    Consent: [facility] RN CM reviewed [facility] services with patient. Patient gave verbal consent for services [facility] telephonic RN CM.   Plan: [facility] RN CM will refer Mrs Liss to [place] for cost concerns for Trulicity, Lantus Solostar q hs and Janumet q am   Pt encouraged to return a call to [place] CM prn  [facility] RN CM sent a successful outreach letter as discussed with [facility] brochure enclosed for review  Routed note to MDs  Joelene Millin L. Lavina Hamman, RN, BSN, [place] Coordinator Office number [phone] Mobile number [phone]  Main [facility] number [phone] Fax number [phone]

## 2019-06-09 ENCOUNTER — Other Ambulatory Visit: Payer: Self-pay | Admitting: Pharmacist

## 2019-06-09 NOTE — Patient Outreach (Signed)
[facility]) Care Management  Bridget Butler   06/09/2019  Bridget Butler 06-Aug-1946 [id number]  Reason for referral: Medication Assistance with diabetes medications  Referral source: Dr. Pamella Pert Current insurance: [employer]  Outreach:  Unsuccessful telephone call attempt #1 to patient. HIPAA compliant voicemail left requesting a return call  Plan:  -I will mail patient an unsuccessful outreach letter.  -I will make another outreach attempt to patient within 3-4 business days.    Bridget Butler, PharmD, [place] [phone]

## 2019-06-10 DIAGNOSIS — H43811 Vitreous degeneration, right eye: Secondary | ICD-10-CM | POA: Diagnosis not present

## 2019-06-10 DIAGNOSIS — E113593 Type 2 diabetes mellitus with proliferative diabetic retinopathy without macular edema, bilateral: Secondary | ICD-10-CM | POA: Diagnosis not present

## 2019-06-10 DIAGNOSIS — H3582 Retinal ischemia: Secondary | ICD-10-CM | POA: Diagnosis not present

## 2019-06-15 ENCOUNTER — Ambulatory Visit: Payer: Self-pay | Admitting: Pharmacist

## 2019-06-15 ENCOUNTER — Other Ambulatory Visit: Payer: Self-pay | Admitting: Pharmacist

## 2019-06-15 NOTE — Patient Outreach (Signed)
[facility]) Care Management  Farmer   06/15/2019  Lucila Klecka Wronski [birth year] [id number]  Reason for referral: Medication Assistance  Referral source: Dr. Pamella Pert Current insurance: [facility]  PMHx includes but not limited to:  T2DM, HTN, HLD, osteopenia, hyperthyroidism, Vitamin D deficiency, osteopenia  Outreach:  Successful telephone call with patient.  HIPAA identifiers verified.   Subjective:  Patient reports she has all medication currently prescribed but that she is in the coverage gap.  She reports she was restarted on pravastatin and has ordered it through [employer] order.  She thinks that most recent copay for Janumet was $564, Trulicity >$332, Lantus >$200.    Objective: The 10-year ASCVD risk score Mikey Bussing DC Brooke Bonito., et al., 2013) is: 43.7%   Values used to calculate the score:     Age: 73 years     Sex: Female     Is Non-Hispanic African American: Yes     Diabetic: Yes     Tobacco smoker: No     Systolic Blood Pressure: 951 mmHg     Is BP treated: Yes     HDL Cholesterol: 60 mg/dL     Total Cholesterol: 273 mg/dL  Lab Results  Component Value Date   CREATININE 1.10 (H) 06/03/2019   CREATININE 1.22 (H) 11/01/2018   CREATININE 1.17 (H) 09/20/2018    Lab Results  Component Value Date   HGBA1C 6.4 (H) 06/03/2019    Lipid Panel     Component Value Date/Time   CHOL 273 (H) 06/03/2019 1709   TRIG 325 (H) 06/03/2019 1709   HDL 60 06/03/2019 1709   CHOLHDL 4.6 (H) 06/03/2019 1709   LDLCALC 148 (H) 06/03/2019 1709    BP Readings from Last 3 Encounters:  06/03/19 140/65  04/04/19 120/78  11/01/18 138/60    No Known Allergies  Medications Reviewed Today    Reviewed by Barbaraann Faster, RN (Registered Nurse) on 06/08/19 at [place] List Status: <None>  Medication Order Taking? Sig Documenting Provider Last Dose Status Informant  aspirin EC 81 MG tablet [id number] No Take 81 mg by mouth daily. [provider] Taking Active  Self  Blood Glucose Monitoring Suppl (TRUE METRIX METER) DEVI [id number] No 1 Device by Does not apply route 2 (two) times daily. Tenna Delaine D, PA-C Taking Active Self  Dulaglutide (TRULICITY) 2.35 TD/3.2KG SOPN [id number]  Inject 0.5 mLs into the skin once a week. Rutherford Guys, MD  Active   glucose blood (TRUE METRIX BLOOD GLUCOSE TEST) test strip [id number] No Use as instructed Leonie Douglas, PA-C Taking Active Self  Insulin Glargine (LANTUS SOLOSTAR) 100 UNIT/ML Solostar Pen [id number]  Inject 20 Units into the skin at bedtime. Rutherford Guys, MD  Active   Insulin Pen Needle (PEN NEEDLES) 32G X 5 MM MISC [id number] No 1 Units by Does not apply route daily. Shawnee Knapp, MD Taking Active Self  lisinopril-hydrochlorothiazide (ZESTORETIC) 20-12.5 MG tablet [id number] No TAKE 2 TABLETS EVERY DAY  (NEW DOSE) Rutherford Guys, MD Taking Active   omeprazole (PRILOSEC) 40 MG capsule [id number] No Take 40 mg by mouth daily. [provider] Taking Active Self  pravastatin (PRAVACHOL) 20 MG tablet [id number]  Take 1 tablet (20 mg total) by mouth daily. Rutherford Guys, MD  Active   sitaGLIPtin-metformin (JANUMET) 50-1000 MG tablet [id number] No Take 1 tablet by mouth daily. Shawnee Knapp, MD Taking Active   Tetrahydrozoline HCl [facility] OP) [id number] No Apply 1 drop to  eye daily as needed (dry eyes). [provider] Taking Active Self          Assessment: Drugs sorted by system:  Hematologic: aspirin 13m  Cardiovascular: lisinopril-HCTZ, pravastatin  Gastrointestinal: omeprazole  Endocrine: dulaglutide, insulin glargine, sitagliptin-metformin  Topical: tetrahydrozoline eye drops  Medication Review Findings:  . Ordered pravastatin through [employer], waiting on medication to arrive to start taking it   Medication Assistance Findings:  Medication assistance needs identified: Lantus, Trulicity, Janumet  Extra Help:  Not eligible for Extra Help Low Income  Subsidy based on reported income and assets  Patient Assistance Programs: Lantus made by [employer]o Income requirement met: Yes o Out-of-pocket prescription expenditure met:   Yes - Patient has met application requirements to apply for this program.  - Reviewed program requirements with patient.    Trulicity made by ENezpercerequirement met: Yes o Out-of-pocket prescription expenditure met:   Not Applicable - Patient has met application requirements to apply for this program.  - Reviewed program requirements with patient.    Janumet made by MDIRECTVo Income requirement met: Yes o Out-of-pocket prescription expenditure met:   Not Applicable - Patient has met application requirements to apply for this program.  - Reviewed program requirements with patient.     Additional medication assistance options reviewed with patient as warranted:  Insurance OTC catalogue  Plan: . I will route patient assistance letter to TEastovertechnician who will coordinate patient assistance program application process for medications listed above.  [facility] Behavioral Healthpharmacy technician will assist with obtaining all required documents from both patient and provider(s) and submit application(s) once completed.    CRalene Bathe PharmD, [phone]

## 2019-06-16 ENCOUNTER — Other Ambulatory Visit: Payer: Self-pay | Admitting: Pharmacy Technician

## 2019-06-16 NOTE — Patient Outreach (Signed)
[facility] [facility]) Care Management  06/16/2019  Bridget Butler Nov 15, 1945 [id number]                                       Medication Assistance Referral  Referral From: [place]: Lantus Solostar / [employer] Patient application portion:  Education officer, museum portion: Faxed  to Dr. Pamella Pert Provider address/fax verified via: Office website  Medication/Company: Danelle Berry / Ralph Leyden Cares Patient application portion:  Education officer, museum portion: Faxed  to Dr. Pamella Pert Provider address/fax verified via: Office website  Medication/Company: Carlyn Reichert / [employer]ck Patient application portion: Education officer, museum portion: Interoffice Mailed to Dr. Pamella Pert Provider address/fax verified via: Office website  Follow up:  Will follow up with patient in 7-10 business days to confirm application(s) have been received.  Bridget Butler Certified Pharmacy Technician [place] Management Direct [phone]

## 2019-06-20 ENCOUNTER — Telehealth: Payer: Self-pay

## 2019-06-20 NOTE — Telephone Encounter (Signed)
Received form from [place] care for insulin expense, form copy will be at nurse station, og with [place]

## 2019-06-23 ENCOUNTER — Telehealth: Payer: Self-pay

## 2019-06-23 NOTE — Telephone Encounter (Signed)
[place] form given to doctor to complete for janumet for completion, form must be given to pt, not faxed

## 2019-06-24 ENCOUNTER — Other Ambulatory Visit: Payer: Self-pay | Admitting: Family Medicine

## 2019-06-24 ENCOUNTER — Telehealth: Payer: Self-pay

## 2019-06-24 ENCOUNTER — Other Ambulatory Visit: Payer: Self-pay | Admitting: Pharmacy Technician

## 2019-06-24 MED ORDER — VITAMIN D (ERGOCALCIFEROL) 1.25 MG (50000 UNIT) PO CAPS: 50000.0000 [IU] | ORAL_CAPSULE | ORAL | 0 refills | Status: DC

## 2019-06-24 MED ORDER — PRAVASTATIN SODIUM 40 MG PO TABS: 40.0000 mg | ORAL_TABLET | Freq: Every day | ORAL | 3 refills | Status: DC

## 2019-06-24 NOTE — Patient Outreach (Signed)
[place] [facility]) Care Management  06/24/2019  Bridget Butler Chain 01/25/1946 [id number]   Received patient portion(s) of patient assistance application for Trulicity, Lantus Solostar and Janumet. Faxed completed application(s) and required documents into [employer] and [employer].  Have not received provider portion of [employer] application for Janumet that was interofficed on 8/28. Will submit completed application to [employer] once document has been received.  Will follow up with company(ies) in 5-7 business days to check status of application(s).  [name] [name] [place] Certified Pharmacy Technician [place] Management Direct Dial:[phone]

## 2019-06-24 NOTE — Telephone Encounter (Signed)
Mailed pt assistance form to [facility] to [address]

## 2019-06-29 ENCOUNTER — Other Ambulatory Visit: Payer: Self-pay | Admitting: Pharmacy Technician

## 2019-06-29 NOTE — Patient Outreach (Signed)
[place] [facility]) Care Management  06/29/2019  Bridget Butler 1946/03/31 [id number]    Follow up call placed to [facility] regarding patient assistance application(s) for Trulicity , [name] states that patients approval had been back dated to 10/29/18 when her Nancee Liter had been approved thru program. Faxed script portion of application to [facility] and will follow up with company in 3-5 business days to  check shipping status.   Follow up call placed to [place] regarding patient assistance application(s) for Lantus Solostar , [name] states that application is still being processed and suggests to followmup in 2-3 business days. Will follow up with [employer] in 3-5 business days.  [name] [name] [place] Certified Pharmacy Technician [place] Management Direct Dial:[phone]

## 2019-07-01 ENCOUNTER — Other Ambulatory Visit: Payer: Self-pay | Admitting: Pharmacy Technician

## 2019-07-01 NOTE — Patient Outreach (Signed)
[place] [facility]) Care Management  07/01/2019  [name] 1945/10/22 [id number]    Follow up call placed to [facility] regarding patient assistance shipping details for Trulicity, Remo Lipps states that order for medication has been processed and medication to delivered week of 9/14. Remo Lipps was unable to provide exact delivery date.  Follow up call placed to [employer] regarding patient assistance application(s) for Lantus Solostar , [name] states application is still under review. Will follow up in 2-3 business days.  Received provider portion(s) of patient assistance application(s) for Janumet. Prepared to mail completed application and required documents into [employer].  Will follow up with [employer] in 10-14 business days to check status of application(s).  [name] [name] [place] Certified Pharmacy Technician [place] Management Direct Dial:[phone]

## 2019-07-04 ENCOUNTER — Other Ambulatory Visit: Payer: Self-pay | Admitting: Family Medicine

## 2019-07-04 DIAGNOSIS — IMO0002 Reserved for concepts with insufficient information to code with codable children: Secondary | ICD-10-CM

## 2019-07-04 DIAGNOSIS — E1165 Type 2 diabetes mellitus with hyperglycemia: Secondary | ICD-10-CM

## 2019-07-04 NOTE — Telephone Encounter (Signed)
Requested medication (s) are due for refill today: yes  Requested medication (s) are on the active medication list: yes  Last refill:  02/2019  Future visit scheduled: yes   Notes to clinic:  Patient is almost out of medication  review for refill  Requested Prescriptions  Pending Prescriptions Disp Refills   sitaGLIPtin-metformin (JANUMET) 50-1000 MG tablet 90 tablet 1    Sig: Take 1 tablet by mouth daily.     There is no refill protocol information for this order

## 2019-07-04 NOTE — Telephone Encounter (Signed)
Medication Refill - Medication: sitaGLIPtin-metformin (JANUMET) 50-1000 MG tablet  Has the patient contacted their pharmacy? Yes.   (Agent: If no, request that the patient contact the pharmacy for the refill.) (Agent: If yes, when and what did the pharmacy advise?)  Preferred Pharmacy (with phone number or street name):  [place], [place] [phone] (Phone) [phone] (Fax)    Agent: Please be advised that RX refills may take up to 3 business days. We ask that you follow-up with your pharmacy.   PATIENT IS ALMOST OUT OF THIS MEDICATION

## 2019-07-07 ENCOUNTER — Telehealth: Payer: Self-pay

## 2019-07-07 ENCOUNTER — Other Ambulatory Visit: Payer: Self-pay

## 2019-07-07 ENCOUNTER — Other Ambulatory Visit: Payer: Self-pay | Admitting: Pharmacy Technician

## 2019-07-07 DIAGNOSIS — I10 Essential (primary) hypertension: Secondary | ICD-10-CM

## 2019-07-07 MED ORDER — LISINOPRIL-HYDROCHLOROTHIAZIDE 20-12.5 MG PO TABS: ORAL_TABLET | ORAL | 0 refills | Status: DC

## 2019-07-07 MED ORDER — JANUMET 50-1000 MG PO TABS: 1.0000 | ORAL_TABLET | Freq: Every day | ORAL | 1 refills | Status: DC

## 2019-07-07 NOTE — Patient Outreach (Signed)
[facility] Butler) Care Management  07/07/2019  Bridget Butler 1946/01/29 [id number]    Follow up call placed to [name] regarding patient assistance application(s) for Lantus Solostar , [name] confirms patient has been approved as of 9/14 until 10/20/19. Medication delivered to Dr. Pamella Pert office today (9/17) @ 10:40am.  Follow up:  Will follow up with [employer] in 7-10 business days to check application status of Janumet.  [name] [name] Certified Pharmacy Technician [place] Management Direct [phone]

## 2019-07-07 NOTE — Telephone Encounter (Signed)
Received one box from [employer] with Solostar pen(s).  Pt notified and will come in to pick it up.  Placed in med fridge.

## 2019-07-08 ENCOUNTER — Telehealth: Payer: Self-pay

## 2019-07-08 NOTE — Telephone Encounter (Signed)
Pt came in to pick up medication.

## 2019-07-12 ENCOUNTER — Other Ambulatory Visit: Payer: Self-pay | Admitting: Pharmacy Technician

## 2019-07-12 NOTE — Patient Outreach (Signed)
[facility]) Care Management  07/12/2019  Lotus Santillo [place] [birth year] [id number]   Incoming call from patient stating she received a phone call from providers office stating her Lantus Casimiro Needle has been delivered to office. Ms. Biswell states that she has not received her Trulicity from [employer].  Outreach call made to [facility] [employer]) [place] states that medication is set to be delivered to patients home tomorrow (9/23).  Successful outreach made to patient, HIPAA identifiers verified. Informed her of delivery scheduled for tomorrow.  Will follow up with patient in 5-7 business days with update of Janumet application thru [employer].  [name] [name] [place] Certified Pharmacy Technician [place] Management Direct Dial:[phone]

## 2019-07-19 ENCOUNTER — Other Ambulatory Visit: Payer: Self-pay | Admitting: Pharmacy Technician

## 2019-07-19 NOTE — Patient Outreach (Signed)
[facility]) Care Management  07/19/2019  Antonisha Waskey Csaszar 29-Sep-1946 [id number]    Follow up call placed to [employer] regarding patient assistance application(s) for Janumet , Sara Chu confirms application has been received and that attestation from was mailed out to patient 9/23.    Successful call placed to patient regarding patient assistance receipt of attestation form from [place] for [employer], [place] identifiers verified. Ms. Huston confirms that she received attestation form in the mail yesterday. She completed form while on the phone and states she will place in the mail.  Follow up:  Will follow up with [employer] in 7-10 business days to check application status.  [name] [name] [place] Certified Pharmacy Technician [place] Management Direct Dial:[phone]

## 2019-08-18 ENCOUNTER — Other Ambulatory Visit: Payer: Self-pay | Admitting: Pharmacy Technician

## 2019-08-18 NOTE — Patient Outreach (Signed)
[facility] Care Management  08/18/2019  Mykiah Schmuck [place] October 06, 1946 [id number]    Follow up call placed to [employer] regarding patient assistance update for Janumet, Abran Richard states that [employer] has not received attestation form back from patient. Informed her that patient has mailed it back in on 9/30. Abran Richard spoke to supervisor and got authorization to remail patient another attestation form and patient will be allowed to fax back into them.    Successful call placed to patient regarding patient assistance update for Janumet, HIPAA identifiers verified. Informed Ms. Babers of above details and informed her that I will mail out a return envelope for her to mail the attestation back to me in so that I can fax it to [employer]. She confirms that that would be fine.  Follow up:  Will fax attestation form into [employer] once received from patient.  [name] [name] [place] Certified Pharmacy Technician [place] Management Direct Dial:[phone]

## 2019-09-02 ENCOUNTER — Encounter: Payer: Self-pay | Admitting: Family Medicine

## 2019-09-02 ENCOUNTER — Other Ambulatory Visit: Payer: Self-pay

## 2019-09-02 ENCOUNTER — Other Ambulatory Visit: Payer: Self-pay | Admitting: Pharmacy Technician

## 2019-09-02 ENCOUNTER — Ambulatory Visit (INDEPENDENT_AMBULATORY_CARE_PROVIDER_SITE_OTHER): Payer: Medicare HMO | Admitting: Family Medicine

## 2019-09-02 VITALS — BP 128/64 | HR 96 | Temp 98.5°F | Ht 63.0 in | Wt 189.0 lb

## 2019-09-02 DIAGNOSIS — Z23 Encounter for immunization: Secondary | ICD-10-CM

## 2019-09-02 DIAGNOSIS — E559 Vitamin D deficiency, unspecified: Secondary | ICD-10-CM | POA: Diagnosis not present

## 2019-09-02 DIAGNOSIS — I10 Essential (primary) hypertension: Secondary | ICD-10-CM | POA: Diagnosis not present

## 2019-09-02 DIAGNOSIS — R062 Wheezing: Secondary | ICD-10-CM | POA: Diagnosis not present

## 2019-09-02 DIAGNOSIS — E785 Hyperlipidemia, unspecified: Secondary | ICD-10-CM | POA: Diagnosis not present

## 2019-09-02 NOTE — Patient Instructions (Signed)
° ° ° °  If you have lab work done today you will be contacted with your lab results within the next 2 weeks.  If you have not heard from us then please contact us. The fastest way to get your results is to register for My Chart. ° ° °IF you received an x-ray today, you will receive an invoice from [facility]. Please contact [facility] at [phone] with questions or concerns regarding your invoice.  ° °IF you received labwork today, you will receive an invoice from [facility]. Please contact [facility] at [phone] with questions or concerns regarding your invoice.  ° °Our billing staff will not be able to assist you with questions regarding bills from these companies. ° °You will be contacted with the lab results as soon as they are available. The fastest way to get your results is to activate your My Chart account. Instructions are located on the last page of this paperwork. If you have not heard from us regarding the results in 2 weeks, please contact this office. °  ° ° ° °

## 2019-09-02 NOTE — Progress Notes (Signed)
11/13/20203:13 PM  Bridget Butler 1946-03-30, 73 y.o., female [id number]  Chief Complaint  Patient presents with  . Diabetes    goes to the eye doctor twice a yr. Will make appt soon  . Hypertension    only taking 1 of the lisinopril hctz, bid is causing polyuria    HPI:   Patient is a 73 y.o. female with past medical history significant for DM2 on insulin, HTN, HLP, osteopenia, vit D deficiency, hyperthyroidism who presents today for routine followup  Last OV Aug 2020 Increased pravastatin - tolerating well High dose vitamin D - tolerating well Checks cbgs fasting, 90-110s, rare 70s Denies any sx of hypoglycemia Checks BP at home, 110-120/60-70s Taking one lisinopril-hctz 20-12.5mg  daily due to worsening of urinary frequency/urgency  Has been having wheezing for about a year, mostly feels it in the bottom of her throat, worse with speaking for periods of time, going up stairs to visit family No reflux, non smoker, no h/o asthma Deep breathing help Associated with SOB, no sign coughing No hoarseness Albuterol inhaler does not help Has not seen pulm   Lab Results  Component Value Date   HGBA1C 6.4 (H) 06/03/2019   HGBA1C 7.0 (H) 07/08/2018   HGBA1C 6.7 (A) 03/29/2018   Lab Results  Component Value Date   LDLCALC 148 (H) 06/03/2019   CREATININE 1.10 (H) 06/03/2019    Depression screen PHQ 2/9 06/08/2019 06/03/2019 09/20/2018  Decreased Interest 0 0 0  Down, Depressed, Hopeless 0 0 0  PHQ - 2 Score 0 0 0    Fall Risk  06/08/2019 06/03/2019 09/20/2018 03/29/2018 02/12/2018  Falls in the past year? 1 0 0 No No  Number falls in past yr: 0 0 - - -  Injury with Fall? 0 0 - - -  Risk for fall due to : History of fall(s) - - - -  Follow up Falls prevention discussed Falls evaluation completed - - -     No Known Allergies  Prior to Admission medications   Medication Sig Start Date End Date Taking? Authorizing Provider  aspirin EC 81 MG tablet Take 81 mg by mouth daily.    Yes [provider]  Blood Glucose Monitoring Suppl (TRUE METRIX METER) DEVI 1 Device by Does not apply route 2 (two) times daily. 07/12/18  Yes Timmothy Euler, [place] D, PA-C  Dulaglutide (TRULICITY) 0.93 OI/7.1IW SOPN Inject 0.5 mLs into the skin once a week. 06/03/19  Yes Rutherford Guys, MD  glucose blood (TRUE METRIX BLOOD GLUCOSE TEST) test strip Use as instructed 07/12/18  Yes Timmothy Euler, [place] D, PA-C  Insulin Glargine (LANTUS SOLOSTAR) 100 UNIT/ML Solostar Pen Inject 20 Units into the skin at bedtime. 06/08/19  Yes Rutherford Guys, MD  Insulin Pen Needle (PEN NEEDLES) 32G X 5 MM MISC 1 Units by Does not apply route daily. 07/08/18  Yes Shawnee Knapp, MD  lisinopril-hydrochlorothiazide (ZESTORETIC) 20-12.5 MG tablet TAKE 2 TABLETS EVERY DAY  (NEW DOSE) 07/07/19  Yes Rutherford Guys, MD  omeprazole (PRILOSEC) 40 MG capsule Take 40 mg by mouth daily.   Yes [provider]  pravastatin (PRAVACHOL) 40 MG tablet Take 1 tablet (40 mg total) by mouth daily. 06/24/19  Yes Rutherford Guys, MD  sitaGLIPtin-metformin (JANUMET) 50-1000 MG tablet Take 1 tablet by mouth daily. 07/07/19  Yes Rutherford Guys, MD  Tetrahydrozoline HCl (VISINE OP) Apply 1 drop to eye daily as needed (dry eyes).   Yes [provider]  Vitamin D,  Ergocalciferol, (DRISDOL) 1.25 MG (50000 UT) CAPS capsule Take 1 capsule (50,000 Units total) by mouth every 7 (seven) days. 06/24/19  Yes Rutherford Guys, MD    Past Medical History:  Diagnosis Date  . Cataract   . Diabetes mellitus without complication ([place])   . Thyroid disease     Past Surgical History:  Procedure Laterality Date  . COSMETIC SURGERY    . GALLBLADDER SURGERY    . TUBAL LIGATION      Social History   Tobacco Use  . Smoking status: Never Smoker  . Smokeless tobacco: Never Used  Substance Use Topics  . Alcohol use: No    Frequency: Never    Family History  Problem Relation Age of Onset  . Heart disease Mother   . Hypertension  Mother   . Cancer Sister   . Diabetes Sister     ROS Per hpi Neg: fever or chills CV: no chest pain, palpitations, edema Abd: no nausea, vomiting, abd pain  OBJECTIVE:  Today's Vitals   09/02/19 1446  BP: 128/64  Pulse: 96  Temp: 98.5 F (36.9 C)  SpO2: 100%  Weight: 189 lb (85.7 kg)  Height: 5\' 3"  (1.6 m)   Body mass index is 33.48 kg/m.   Physical Exam Vitals signs and nursing note reviewed.  Constitutional:      Appearance: She is well-developed.  HENT:     Head: Normocephalic and atraumatic.     Mouth/Throat:     Pharynx: No oropharyngeal exudate.  Eyes:     General: No scleral icterus.    Conjunctiva/sclera: Conjunctivae normal.     Pupils: Pupils are equal, round, and reactive to light.  Neck:     Musculoskeletal: Neck supple.  Cardiovascular:     Rate and Rhythm: Normal rate and regular rhythm.     Heart sounds: Normal heart sounds. No murmur. No friction rub. No gallop.   Pulmonary:     Effort: Pulmonary effort is normal.     Breath sounds: Normal breath sounds. No wheezing, rhonchi or rales.  Musculoskeletal:     Right lower leg: No edema.     Left lower leg: No edema.  Skin:    General: Skin is warm and dry.  Neurological:     Mental Status: She is alert and oriented to person, place, and time.     Peak flow reading is 150, about 56 % of predicted.  No results found for this or any previous visit (from the past 24 hour(s)).  No results found.   ASSESSMENT and PLAN  1. Hyperlipidemia LDL goal <100 Checking labs today, medications will be adjusted as needed.  - Comprehensive metabolic panel - Lipid panel  2. Vitamin D deficiency Checking labs today, medications will be adjusted as needed.  - Vitamin D, 25-hydroxy  3. Need for vaccination - Prevnar 13  4. Essential hypertension Controlled. Continue current regime.   5. Wheezing - Ambulatory referral to Pulmonology  Other orders - Check Peak Flow  Return in about 3 months  (around 12/03/2019).    Rutherford Guys, MD Primary Care at [place], [place] [address] Ph.  [phone] Fax [phone]

## 2019-09-02 NOTE — Patient Outreach (Signed)
[place] [facility]) Care Management  09/02/2019  Bridget Butler May 13, 1946 [id number]    Follow up call placed to [employer] regarding patient assistance application(s) for [name] confirms patient has been approved as of 11/11 until 10/20/19. Medication to arrive at patients in home 10-14 business days.  Follow up:  Will follow up with patient in 14-20 business days to confirm medication has been received.  [name] [name] [place] Certified Pharmacy Technician [place] Management Direct [phone]

## 2019-09-03 LAB — VITAMIN D 25 HYDROXY (VIT D DEFICIENCY, FRACTURES): Vit D, 25-Hydroxy: 46.7 ng/mL (ref 30.0–100.0)

## 2019-09-03 LAB — LIPID PANEL
Chol/HDL Ratio: 3.1 ratio (ref 0.0–4.4)
Cholesterol, Total: 184 mg/dL (ref 100–199)
HDL: 60 mg/dL (ref >39)
LDL Chol Calc (NIH): 100 mg/dL — ABNORMAL HIGH (ref 0–99)
Triglycerides: 136 mg/dL (ref 0–149)
VLDL Cholesterol Cal: 24 mg/dL (ref 5–40)

## 2019-09-03 LAB — COMPREHENSIVE METABOLIC PANEL
ALT: 16 IU/L (ref 0–32)
AST: 16 IU/L (ref 0–40)
Albumin/Globulin Ratio: 1.4 (ref 1.2–2.2)
Albumin: 4.2 g/dL (ref 3.7–4.7)
Alkaline Phosphatase: 70 IU/L (ref 39–117)
BUN/Creatinine Ratio: 12 (ref 12–28)
BUN: 12 mg/dL (ref 8–27)
Bilirubin Total: 0.3 mg/dL (ref 0.0–1.2)
CO2: 26 mmol/L (ref 20–29)
Calcium: 9.9 mg/dL (ref 8.7–10.3)
Chloride: 99 mmol/L (ref 96–106)
Creatinine, Ser: 1.03 mg/dL — ABNORMAL HIGH (ref 0.57–1.00)
GFR calc Af Amer: 62 mL/min/{1.73_m2} (ref >59)
GFR calc non Af Amer: 54 mL/min/{1.73_m2} — ABNORMAL LOW (ref >59)
Globulin, Total: 2.9 g/dL (ref 1.5–4.5)
Glucose: 206 mg/dL — ABNORMAL HIGH (ref 65–99)
Potassium: 4.2 mmol/L (ref 3.5–5.2)
Sodium: 140 mmol/L (ref 134–144)
Total Protein: 7.1 g/dL (ref 6.0–8.5)

## 2019-09-05 ENCOUNTER — Telehealth: Payer: Self-pay | Admitting: Family Medicine

## 2019-09-05 NOTE — Telephone Encounter (Unsigned)
Copied from [place] [id number]. Topic: General - Other >> Sep 05, 2019  2:59 PM Celene Kras wrote: Reason for CRM: Ebony Hail, from Dr. Benjamine Mola, called and is needing more information regarding pts referral. Please advise.   [phone]

## 2019-09-08 ENCOUNTER — Other Ambulatory Visit: Payer: Self-pay | Admitting: Family Medicine

## 2019-09-09 ENCOUNTER — Telehealth: Payer: Self-pay | Admitting: Family Medicine

## 2019-09-09 NOTE — Telephone Encounter (Signed)
New referral has been placed for Pulmonary

## 2019-09-09 NOTE — Telephone Encounter (Signed)
Dr. Verlan Friends office called to inform that patient needs a referral to pulmonary and not ent  Pulmonary referral placed in system on 11/16

## 2019-09-13 ENCOUNTER — Other Ambulatory Visit: Payer: Self-pay | Admitting: Pharmacist

## 2019-09-13 NOTE — Patient Outreach (Signed)
[employer] [facility]) Care Management  [place] 09/13/2019  Bridget Butler 06-Jan-1946 [id number]  [place] assisted patient with patient assistance program applications in [phone] for Janumet, Trulicity, and Lantus.    Outreach call to patient regarding renewal process for 2021.  Unsuccessful call attempt to patient.  Left HIPAA compliant voicemail.    Plan: Will outreach patient again next week if no call back  Ralene Bathe, PharmD, [place] [phone]

## 2019-09-19 ENCOUNTER — Ambulatory Visit: Payer: Self-pay | Admitting: Pharmacist

## 2019-09-19 ENCOUNTER — Other Ambulatory Visit: Payer: Self-pay | Admitting: Pharmacist

## 2019-09-19 ENCOUNTER — Other Ambulatory Visit: Payer: Self-pay | Admitting: Family Medicine

## 2019-09-19 DIAGNOSIS — IMO0002 Reserved for concepts with insufficient information to code with codable children: Secondary | ICD-10-CM

## 2019-09-19 DIAGNOSIS — E1165 Type 2 diabetes mellitus with hyperglycemia: Secondary | ICD-10-CM

## 2019-09-19 NOTE — Telephone Encounter (Signed)
Pt stated she needs refill for Insulin Glargine (LANTUS SOLOSTAR) 100 UNIT/ML Solostar Pen and Dulaglutide (TRULICITY) 0.76 AU/6.3FH SOPN Pt stated she uses the Patient Assistance Program and that Dr. Pamella Pert needs to send refills to them. Pt stated Caryl Pina with [facility] who is assisting her. Please advise.

## 2019-09-19 NOTE — Patient Outreach (Addendum)
[facility]) Care Management  [place] 09/19/2019  Bridget Butler Apr 19, 1946 [id number]  [place] assisted patient with patient assistance program applications in 5929 for Janumet, Trulicity, and Lantus.    Outreach call to patient regarding renewal process for 2021.  Unsuccessful call attempt #2 to patient.  Left HIPAA compliant voicemail.   Plan: Will outreach again later this week if no response.   Ralene Bathe, PharmD, [place] [phone]   Addendum: Incoming call from patient.  She is agreeable to re-apply for patient assistance in 2021.  We discussed out-of-pocket requirement for Lantus and patient interested in substitution to [place] through [employer] to avoid having to meet TROOP requirement.  She also reports she is running low on Trulicity and Lantus and does not know how to order refills.  I provided patient with contact information for both [place] for her to request refills.    Plan: -Will outreach Dr. Pamella Pert re: substitution request for Lantus --> Basaglar.   -Will update [place], PharmD, [place] [phone]

## 2019-09-22 ENCOUNTER — Other Ambulatory Visit: Payer: Self-pay | Admitting: Pharmacist

## 2019-09-22 NOTE — Patient Outreach (Signed)
[place] [facility]) Care Management  [place] 09/22/2019  Kourtnee Lahey Childers [birth year] [id number]  2nd attempt to contact PCP Dr. Pamella Pert to inquire about substitution from Lantus to [facility] for patient assistance program so that patient can apply in January rather than wait until she has spent 2% of her annual income on copays as required by [employer].  Message left with office.    Plan: Will await call back  Ralene Bathe, PharmD, [place] [phone]

## 2019-09-22 NOTE — Telephone Encounter (Signed)
Colleen calling again to check on the status of this medication change. Please advise.   [phone]

## 2019-09-23 ENCOUNTER — Other Ambulatory Visit: Payer: Self-pay | Admitting: Family Medicine

## 2019-09-23 ENCOUNTER — Ambulatory Visit: Payer: Self-pay | Admitting: Pharmacist

## 2019-09-23 MED ORDER — BASAGLAR KWIKPEN 100 UNIT/ML ~~LOC~~ SOPN: 20.0000 [IU] | PEN_INJECTOR | Freq: Every day | SUBCUTANEOUS | 3 refills | Status: DC

## 2019-09-26 ENCOUNTER — Other Ambulatory Visit: Payer: Self-pay | Admitting: Pharmacist

## 2019-09-26 ENCOUNTER — Ambulatory Visit: Payer: Self-pay | Admitting: Pharmacist

## 2019-09-26 NOTE — Patient Outreach (Signed)
[place] [facility]) Care Management  [place] 09/26/2019  Bridget Butler 1946-01-31 [id number]  Communication received from PCP that substitution from Lantus to [place] in order to apply for patient assistance program through [employer].    Unsuccessful call to patient to provide update.  HIPAA compliant voicemail left.   Plan: -I will route patient assistance letter to [place] technician who will coordinate patient assistance program application process for Lantus, Trulicity, and Janumet for 2021.  [facility] pharmacy technician will assist with obtaining all required documents from both patient and provider(s) and submit application(s) once completed.    -Will call patient again later this week to provide update re: insulin substitution  Ralene Bathe, PharmD, [place] [phone]

## 2019-09-28 NOTE — Telephone Encounter (Signed)
Please advise on refill. Is pt suppose to have both

## 2019-09-29 ENCOUNTER — Other Ambulatory Visit: Payer: Self-pay | Admitting: Pharmacist

## 2019-09-29 ENCOUNTER — Other Ambulatory Visit: Payer: Self-pay | Admitting: Pharmacy Technician

## 2019-09-29 ENCOUNTER — Ambulatory Visit: Payer: Self-pay | Admitting: Pharmacist

## 2019-09-29 MED ORDER — LANTUS SOLOSTAR 100 UNIT/ML ~~LOC~~ SOPN: 20.0000 [IU] | PEN_INJECTOR | Freq: Every day | SUBCUTANEOUS | 5 refills | Status: DC

## 2019-09-29 MED ORDER — TRULICITY 0.75 MG/0.5ML ~~LOC~~ SOAJ: 0.5000 mL | SUBCUTANEOUS | 5 refills | Status: DC

## 2019-09-29 NOTE — Patient Outreach (Signed)
[place] [facility]) Care Management  [place] 09/29/2019  Bridget Butler May 09, 1946 [id number]  F/u call to update patient re: MD approval to apply for [place] patient assistance program (along with Trulicity + Janumet)  Outreach:  Unsuccessful telephone call attempt #2 to patient. HIPAA compliant voicemail left requesting a return call  Plan:  -I will make another outreach attempt to patient within 3-4 business days.    Ralene Bathe, PharmD, [place] [phone]

## 2019-09-29 NOTE — Patient Outreach (Signed)
[facility]) Care Management  09/29/2019  Junell Cullifer Cobern 09-Jan-1946 [id number]                                       Medication Assistance Referral  Referral From: [place] / Gean [place] Patient application portion:  Mailed Provider application portion: Faxed  to Dr. Meda Coffee Pamella Pert Provider address/fax verified via: Office website  Medication/Company: Carlyn Reichert / [employer] Patient application portion:  Education officer, museum portion: Interoffice Mailed to Dr. Macarthur Critchley Provider address/fax verified via: Office website   Follow up:  Will follow up with patient in 10-14 business days to confirm application(s) have been received.  [name] [name] [place] Certified Pharmacy Technician [place] Management Direct Dial:[phone]

## 2019-10-03 ENCOUNTER — Ambulatory Visit: Payer: Self-pay | Admitting: Pharmacist

## 2019-10-03 ENCOUNTER — Other Ambulatory Visit: Payer: Self-pay | Admitting: Pharmacist

## 2019-10-03 NOTE — Patient Outreach (Signed)
[place] [facility]) Care Management  [place] 10/03/2019  Bridget Butler 1946/10/13 [id number]  Reason for call: F/u call to update patient re: MD approval to apply for [place] patient assistance program (along with Trulicity + Janumet)  Successful call to patient.  Provided update to patient regarding insulin switch to [employer].  Patient voiced appreciation as she prefers to use patient assistance programs in which she can call in refills herself rather than rely on provider office.  She will look for applications to arrive this week in the mail for [employer] and [employer].  She will call me if she has any questions with completing applications.   Ralene Bathe, PharmD, [place] [phone]

## 2019-10-04 ENCOUNTER — Ambulatory Visit: Payer: Medicare HMO | Admitting: Internal Medicine

## 2019-10-06 ENCOUNTER — Telehealth: Payer: Self-pay

## 2019-10-06 NOTE — Telephone Encounter (Signed)
Assistance application mailed in to [facility] [address] [place], [place] [address]

## 2019-10-17 ENCOUNTER — Other Ambulatory Visit: Payer: Self-pay | Admitting: Pharmacy Technician

## 2019-10-17 NOTE — Patient Outreach (Signed)
[facility]) Care Management  10/17/2019  Bridget Butler 1946-05-08 [id number]   Received provider portion(s) of patient assistance application(s) for Janumet. Prepared to mail completed application and required documents into [employer].  Will follow up with company(ies) in 10-14 business days to check status of application(s).  [name] [name] [place] Certified Pharmacy Technician [place] Management Direct [phone]

## 2019-10-19 ENCOUNTER — Other Ambulatory Visit: Payer: Self-pay | Admitting: Pharmacy Technician

## 2019-10-19 NOTE — Patient Outreach (Signed)
[place] [facility]) Care Management  10/19/2019  Sheyann Sulton Conroy [birth year] [id number]   Received provider portion(s) of patient assistance application(s) for Trulicity and [employer]. Faxed completed application and required documents into [employer].  Will follow up with company(ies) in 10-14 business days to check status of application(s).  [name] [name] [place] Certified Pharmacy Technician [place] Management Direct Dial:[phone]

## 2019-10-25 ENCOUNTER — Other Ambulatory Visit: Payer: Self-pay | Admitting: Family Medicine

## 2019-10-25 DIAGNOSIS — I10 Essential (primary) hypertension: Secondary | ICD-10-CM

## 2019-10-25 MED ORDER — LISINOPRIL-HYDROCHLOROTHIAZIDE 20-12.5 MG PO TABS: ORAL_TABLET | ORAL | 0 refills | Status: DC

## 2019-10-25 NOTE — Telephone Encounter (Signed)
Medication Refill - Medication: lisinopril-hydrochlorothiazide (ZESTORETIC) 20-12.5 MG tablet    Has the patient contacted their pharmacy? Yes.   (Agent: If no, request that the patient contact the pharmacy for the refill.) (Agent: If yes, when and what did the pharmacy advise?)  Preferred Pharmacy (with phone number or street name):  [facility] ([address] [place] - [address]  [address]  ([place]) [place] [address]  Phone: [phone] Fax: [phone]  Not a 24 hour pharmacy; exact hours not known.     Agent: Please be advised that RX refills may take up to 3 business days. We ask that you follow-up with your pharmacy.

## 2019-10-26 ENCOUNTER — Other Ambulatory Visit: Payer: Self-pay | Admitting: Pharmacy Technician

## 2019-10-26 NOTE — Patient Outreach (Signed)
[facility]) Care Management  10/26/2019  Bridget Butler 29-Oct-1945 [id number]    Follow up call placed to [facility] regarding patient assistance application(s) for Trulicity and Basaglar , Exie Parody confirms application has been received however it is still being processed.  Follow up:  Will follow up with company in 5-7 business days to check application status  [name]. [name] [place] Certified Pharmacy Technician [place] Management Direct [phone]

## 2019-11-07 ENCOUNTER — Other Ambulatory Visit: Payer: Self-pay | Admitting: Pharmacy Technician

## 2019-11-07 NOTE — Patient Outreach (Signed)
[place] [facility]) Care Management  11/07/2019  Bridget Butler [birth year] [id number]    Follow up call placed to [facility] regarding patient assistance application(s) for Trulicity and Nancee Liter , Seth Bake confirms patient has been approved as of 1/15 until 10/19/20. [facility] to contact Ms. Wenner to set up shipping.  Follow up:  Will follow up with patient in 3-5 business days to confirm shipping has been scheduled.  [name] [name] [place] Certified Pharmacy Technician [place] Management Direct Dial:[phone]

## 2019-11-08 ENCOUNTER — Ambulatory Visit: Payer: Medicare HMO | Attending: Internal Medicine

## 2019-11-08 DIAGNOSIS — Z23 Encounter for immunization: Secondary | ICD-10-CM | POA: Insufficient documentation

## 2019-11-08 NOTE — Progress Notes (Signed)
   Covid-19 Vaccination Clinic  Name:  Bridget Butler    MRN: [id number] DOB: 14-Jan-1946  11/08/2019  [name] was observed post Covid-19 immunization for 15 minutes without incidence. She was provided with Vaccine Information Sheet and instruction to access the V-Safe system.   [name] was instructed to call 911 with any severe reactions post vaccine: [name] Kitchen Difficulty breathing  . Swelling of your face and throat  . A fast heartbeat  . A bad rash all over your body  . Dizziness and weakness    Immunizations Administered    Name Date Dose VIS Date Route   Pfizer COVID-19 Vaccine 11/08/2019 12:44 PM 0.3 mL 09/30/2019 Intramuscular   Manufacturer: [place]   Lot: [id number]   [place]: [address]-4

## 2019-11-16 ENCOUNTER — Other Ambulatory Visit: Payer: Self-pay | Admitting: Family Medicine

## 2019-11-16 MED ORDER — BASAGLAR KWIKPEN 100 UNIT/ML ~~LOC~~ SOPN: 20.0000 [IU] | PEN_INJECTOR | Freq: Every day | SUBCUTANEOUS | 3 refills | Status: DC

## 2019-11-16 NOTE — Telephone Encounter (Signed)
.  Medication Refill - Medication: Insulin Glargine (BASAGLAR KWIKPEN) 100 UNIT/ML SOPN    Has the patient contacted their pharmacy? No. (Agent: If no, request that the patient contact the pharmacy for the refill.) (Agent: If yes, when and what did the pharmacy advise?)  Preferred Pharmacy (with phone number or street name):  [facility], [place] - [address] Phone:  [phone]  Fax:  [phone]       Agent: Please be advised that RX refills may take up to 3 business days. We ask that you follow-up with your pharmacy.

## 2019-11-17 ENCOUNTER — Telehealth: Payer: Self-pay | Admitting: Family Medicine

## 2019-11-17 DIAGNOSIS — E1165 Type 2 diabetes mellitus with hyperglycemia: Secondary | ICD-10-CM

## 2019-11-17 DIAGNOSIS — IMO0002 Reserved for concepts with insufficient information to code with codable children: Secondary | ICD-10-CM

## 2019-11-17 NOTE — Telephone Encounter (Signed)
Pt called to let us know that in order to get her Insulin Glargine [facility]) 100   Through Samuella Bruin ,that [place] cares needs some kind of Description Please call [facility] at [phone]. Patient is unclear of what they asking for

## 2019-11-18 NOTE — Telephone Encounter (Signed)
Please assist and let us know if we have to do anything. I think the provider has provided a script on 09/2019

## 2019-11-21 MED ORDER — LANTUS SOLOSTAR 100 UNIT/ML ~~LOC~~ SOPN: 20.0000 [IU] | PEN_INJECTOR | Freq: Every day | SUBCUTANEOUS | 0 refills | Status: DC

## 2019-11-21 NOTE — Telephone Encounter (Signed)
I have called pt back and informed her that we do not carry samples, however we can send a box to the local pharmacy. I do not know the cost of the mediation. Pt stated understanding.   Medication has been sent to the [facility] per provider verbal okay.   Thanks

## 2019-11-21 NOTE — Telephone Encounter (Signed)
Pt calling back to ask you you have samples of the Insulin Glargine (LANTUS SOLOSTAR) 100 UNIT/ML Solostar Pen  Even though pt has been switched to basglar, she has not received anything, and now pt is completely out of her insulin medication. Pt is hoping you have a sample she can pick up.   cb [phone]

## 2019-11-25 ENCOUNTER — Other Ambulatory Visit: Payer: Self-pay | Admitting: Pharmacy Technician

## 2019-11-25 NOTE — Patient Outreach (Signed)
[place] [facility]) Care Management  11/25/2019  Nyhla Mountjoy Boudreau [birth year] [id number]   Incoming call from patient stating that she received her [place] from [place] cares patient assistance. Reviewed with patient on how to obtain refills.  Will follow up with patient in the next couple of weeks when I have received an update on her Janumet application thru [employer].  [name] [name] [place] Certified Pharmacy Technician [place] Management Direct Dial:[phone]

## 2019-11-29 ENCOUNTER — Other Ambulatory Visit: Payer: Self-pay | Admitting: Pharmacy Technician

## 2019-11-29 ENCOUNTER — Ambulatory Visit: Payer: Medicare HMO | Attending: Internal Medicine

## 2019-11-29 DIAGNOSIS — Z23 Encounter for immunization: Secondary | ICD-10-CM | POA: Insufficient documentation

## 2019-11-29 NOTE — Patient Outreach (Signed)
[place] [facility]) Care Management  11/29/2019  [name] Sep 19, 1946 [id number]    Follow up call placed to [employer] regarding patient assistance application(s) for Janumet , [name] confirms application has been received. Attestation form mailed out to patient on 11/18/19 for completion.   Unsuccessful call placed to patient regarding patient assistance receipt of attestation form from [place] for [employer], HIPAA compliant voicemail left.   Follow up:  Will make additional call in 3-5 business days if call has not been returned.  [name] [name] [name] Certified Pharmacy Technician [place] Management Direct Dial:[phone]

## 2019-11-29 NOTE — Progress Notes (Signed)
   Covid-19 Vaccination Clinic  Name:  Bridget Butler    MRN: [id number] DOB: Feb 12, 1946  11/29/2019  [name] was observed post Covid-19 immunization for 30 minutes based on pre-vaccination screening without incidence. She was provided with Vaccine Information Sheet and instruction to access the V-Safe system.   [name] was instructed to call 911 with any severe reactions post vaccine: [name] Kitchen Difficulty breathing  . Swelling of your face and throat  . A fast heartbeat  . A bad rash all over your body  . Dizziness and weakness    Immunizations Administered    Name Date Dose VIS Date Route   Pfizer COVID-19 Vaccine 11/29/2019  1:30 PM 0.3 mL 09/30/2019 Intramuscular   Manufacturer: [place]   Lot: [id number]   [place]: [id number]

## 2019-12-05 ENCOUNTER — Ambulatory Visit (INDEPENDENT_AMBULATORY_CARE_PROVIDER_SITE_OTHER): Payer: Medicare HMO

## 2019-12-05 ENCOUNTER — Other Ambulatory Visit: Payer: Self-pay

## 2019-12-05 ENCOUNTER — Encounter: Payer: Self-pay | Admitting: Family Medicine

## 2019-12-05 ENCOUNTER — Ambulatory Visit (INDEPENDENT_AMBULATORY_CARE_PROVIDER_SITE_OTHER): Payer: Medicare HMO | Admitting: Family Medicine

## 2019-12-05 VITALS — BP 118/60 | HR 92 | Temp 98.1°F | Ht 63.0 in | Wt 189.0 lb

## 2019-12-05 DIAGNOSIS — E1165 Type 2 diabetes mellitus with hyperglycemia: Secondary | ICD-10-CM | POA: Diagnosis not present

## 2019-12-05 DIAGNOSIS — IMO0002 Reserved for concepts with insufficient information to code with codable children: Secondary | ICD-10-CM

## 2019-12-05 DIAGNOSIS — R7989 Other specified abnormal findings of blood chemistry: Secondary | ICD-10-CM | POA: Diagnosis not present

## 2019-12-05 DIAGNOSIS — R0602 Shortness of breath: Secondary | ICD-10-CM

## 2019-12-05 DIAGNOSIS — I517 Cardiomegaly: Secondary | ICD-10-CM | POA: Diagnosis not present

## 2019-12-05 DIAGNOSIS — R062 Wheezing: Secondary | ICD-10-CM | POA: Diagnosis not present

## 2019-12-05 DIAGNOSIS — Z794 Long term (current) use of insulin: Secondary | ICD-10-CM | POA: Diagnosis not present

## 2019-12-05 MED ORDER — LOSARTAN POTASSIUM-HCTZ 50-12.5 MG PO TABS: 1.0000 | ORAL_TABLET | Freq: Every day | ORAL | 3 refills | Status: DC

## 2019-12-05 NOTE — Patient Instructions (Signed)
[place] [id number] [phone]

## 2019-12-05 NOTE — Progress Notes (Signed)
2/15/202111:54 AM  Bridget Butler [birth year], 74 y.o., female [id number]  Chief Complaint  Patient presents with  . Wheezing  . Follow-up    3 month    HPI:   Patient is a 74 y.o. female with past medical history significant for DM2 on insulin, HTN, HLP, osteopenia, vit D deficiency, hyperthyroidismwho presents today for routine followup  Last OV Nov 2020 - referred to pulm for wheezing that has been present for over a year and abnormal peak flow in clinic  Patient reports fatigue, DOE and wheezing for over a year, that started after she had bronchitis. worse in cold weather, long conversations, albuterol makes her lightheaded but not helping ER eval was unremarkable at onset of sx  She has never smoked She denies any known allergies GERD well controlled on current meds  Checks cbgs fasting at home, 99-125 Denies any hypoglycemia Takes glargine 40J at bedtime trulicity and janumet  She is not seeing endo anymore after unremarkable workup, per patient Has completed covid vaccines  Lab Results  Component Value Date   HGBA1C 6.4 (H) 06/03/2019   HGBA1C 7.0 (H) 07/08/2018   HGBA1C 6.7 (A) 03/29/2018   Lab Results  Component Value Date   LDLCALC 100 (H) 09/02/2019   CREATININE 1.03 (H) 09/02/2019    Depression screen PHQ 2/9 12/05/2019 06/08/2019 06/03/2019  Decreased Interest 0 0 0  Down, Depressed, Hopeless 0 0 0  PHQ - 2 Score 0 0 0    Fall Risk  12/05/2019 06/08/2019 06/03/2019 09/20/2018 03/29/2018  Falls in the past year? 0 1 0 0 No  Number falls in past yr: 0 0 0 - -  Injury with Fall? 0 0 0 - -  Risk for fall due to : - History of fall(s) - - -  Follow up - Falls prevention discussed Falls evaluation completed - -     No Known Allergies  Prior to Admission medications   Medication Sig Start Date End Date Taking? Authorizing Provider  aspirin EC 81 MG tablet Take 81 mg by mouth daily.   Yes [provider]  Blood Glucose Monitoring Suppl (TRUE  METRIX METER) DEVI 1 Device by Does not apply route 2 (two) times daily. 07/12/18  Yes Timmothy Euler, [place] D, PA-C  Dulaglutide (TRULICITY) 8.11 BJ/4.7WG SOPN Inject 0.5 mLs into the skin once a week. 09/29/19  Yes Rutherford Guys, MD  glucose blood (TRUE METRIX BLOOD GLUCOSE TEST) test strip Use as instructed 07/12/18  Yes Timmothy Euler, [place] D, PA-C  Insulin Glargine (BASAGLAR KWIKPEN) 100 UNIT/ML SOPN Inject 0.2 mLs (20 Units total) into the skin at bedtime. 11/16/19  Yes Rutherford Guys, MD  Insulin Glargine (LANTUS SOLOSTAR) 100 UNIT/ML Solostar Pen Inject 20 Units into the skin at bedtime. 11/21/19  Yes Rutherford Guys, MD  Insulin Pen Needle (PEN NEEDLES) 32G X 5 MM MISC 1 Units by Does not apply route daily. 07/08/18  Yes Shawnee Knapp, MD  lisinopril-hydrochlorothiazide (ZESTORETIC) 20-12.5 MG tablet TAKE 2 TABLETS EVERY DAY  (NEW DOSE) 10/25/19  Yes Rutherford Guys, MD  omeprazole (PRILOSEC) 40 MG capsule Take 40 mg by mouth daily.   Yes [provider]  pravastatin (PRAVACHOL) 40 MG tablet Take 1 tablet (40 mg total) by mouth daily. 06/24/19  Yes Rutherford Guys, MD  sitaGLIPtin-metformin (JANUMET) 50-1000 MG tablet Take 1 tablet by mouth daily. 07/07/19  Yes Rutherford Guys, MD  Tetrahydrozoline HCl (VISINE OP) Apply 1 drop to eye daily as needed (  dry eyes).   Yes [provider]  Vitamin D, Ergocalciferol, (DRISDOL) 1.25 MG (50000 UT) CAPS capsule Take 1 capsule (50,000 Units total) by mouth every 7 (seven) days. 06/24/19  Yes Rutherford Guys, MD    Past Medical History:  Diagnosis Date  . Cataract   . Diabetes mellitus without complication ([place])   . Thyroid disease     Past Surgical History:  Procedure Laterality Date  . COSMETIC SURGERY    . GALLBLADDER SURGERY    . TUBAL LIGATION      Social History   Tobacco Use  . Smoking status: Never Smoker  . Smokeless tobacco: Never Used  Substance Use Topics  . Alcohol use: No    Family History  Problem Relation Age  of Onset  . Heart disease Mother   . Hypertension Mother   . Cancer Sister   . Diabetes Sister     Review of Systems  Constitutional: Negative for chills and fever.  Respiratory: Positive for shortness of breath and wheezing. Negative for cough.   Cardiovascular: Negative for chest pain, palpitations, orthopnea, leg swelling and PND.  Gastrointestinal: Negative for abdominal pain, nausea and vomiting.     OBJECTIVE:  Today's Vitals   12/05/19 1141  BP: 118/60  Pulse: 92  Temp: 98.1 F (36.7 C)  SpO2: 99%  Weight: 189 lb (85.7 kg)  Height: 5\' 3"  (1.6 m)   Body mass index is 33.48 kg/m.   Physical Exam Vitals and nursing note reviewed.  Constitutional:      Appearance: She is well-developed.  HENT:     Head: Normocephalic and atraumatic.     Mouth/Throat:     Pharynx: No oropharyngeal exudate.  Eyes:     General: No scleral icterus.    Conjunctiva/sclera: Conjunctivae normal.     Pupils: Pupils are equal, round, and reactive to light.  Cardiovascular:     Rate and Rhythm: Normal rate and regular rhythm.     Heart sounds: Normal heart sounds. No murmur. No friction rub. No gallop.   Pulmonary:     Effort: Pulmonary effort is normal.     Breath sounds: Normal breath sounds. No wheezing, rhonchi or rales.  Musculoskeletal:     Cervical back: Neck supple.     Right lower leg: No edema.     Left lower leg: No edema.  Skin:    General: Skin is warm and dry.  Neurological:     Mental Status: She is alert and oriented to person, place, and time.     No results found for this or any previous visit (from the past 24 hour(s)).  DG Chest 2 View  Result Date: 12/05/2019 CLINICAL DATA:  Wheezing EXAM: CHEST - 2 VIEW COMPARISON:  11/01/2018 FINDINGS: Mild cardiomegaly. Both lungs are clear. The visualized skeletal structures are unremarkable. IMPRESSION: Mild cardiomegaly without acute abnormality of the lungs. Electronically Signed   By: Eddie Candle M.D.   On:  12/05/2019 13:59     ASSESSMENT and PLAN  1. Shortness of breath Provided patient with pulm contact info so that she can schedule appt given wheezing and abnormal PFTs. Changed ACE to ARB.  - DG Chest 2 View; Future - ECHOCARDIOGRAM COMPLETE; Future  2. Cardiomegaly Asymptomatic. Echo to eval structural heart. Consider cards referral.  - ECHOCARDIOGRAM COMPLETE; Future  3. Insulin dependent type 2 diabetes mellitus, uncontrolled ([place]) Checking labs today, medications will be adjusted as needed.  - Hemoglobin A1c - Comprehensive metabolic panel  4.  Abnormal thyroid stimulating hormone (TSH) level - TSH - T4, Free  Other orders - losartan-hydrochlorothiazide (HYZAAR) 50-12.5 MG tablet; Take 1 tablet by mouth daily.  Return in about 3 months (around 03/03/2020).    Rutherford Guys, MD Primary Care at [place], [place] [address] Ph.  [phone] Fax [phone]

## 2019-12-06 LAB — COMPREHENSIVE METABOLIC PANEL
ALT: 14 IU/L (ref 0–32)
AST: 14 IU/L (ref 0–40)
Albumin/Globulin Ratio: 1.3 (ref 1.2–2.2)
Albumin: 4 g/dL (ref 3.7–4.7)
Alkaline Phosphatase: 71 IU/L (ref 39–117)
BUN/Creatinine Ratio: 11 — ABNORMAL LOW (ref 12–28)
BUN: 12 mg/dL (ref 8–27)
Bilirubin Total: 0.3 mg/dL (ref 0.0–1.2)
CO2: 25 mmol/L (ref 20–29)
Calcium: 9.6 mg/dL (ref 8.7–10.3)
Chloride: 103 mmol/L (ref 96–106)
Creatinine, Ser: 1.08 mg/dL — ABNORMAL HIGH (ref 0.57–1.00)
GFR calc Af Amer: 59 mL/min/{1.73_m2} — ABNORMAL LOW (ref >59)
GFR calc non Af Amer: 51 mL/min/{1.73_m2} — ABNORMAL LOW (ref >59)
Globulin, Total: 3.1 g/dL (ref 1.5–4.5)
Glucose: 111 mg/dL — ABNORMAL HIGH (ref 65–99)
Potassium: 4.6 mmol/L (ref 3.5–5.2)
Sodium: 142 mmol/L (ref 134–144)
Total Protein: 7.1 g/dL (ref 6.0–8.5)

## 2019-12-06 LAB — HEMOGLOBIN A1C
Est. average glucose Bld gHb Est-mCnc: 157 mg/dL
Hgb A1c MFr Bld: 7.1 % — ABNORMAL HIGH (ref 4.8–5.6)

## 2019-12-07 ENCOUNTER — Other Ambulatory Visit: Payer: Self-pay | Admitting: Pharmacy Technician

## 2019-12-07 NOTE — Patient Outreach (Signed)
[place] [facility]) Care Management  12/07/2019  Ticia Virgo Vanlanen Nov 16, 1945 [id number]    Successful call placed to patient regarding patient assistance receipt of attestation form from [place] for [place], [place] identifiers verified. Ms. Azucena confirms that she received attestation form. Assisted her with completing.  Follow up:  Will follow up with [employer] in 10-14 business days to check application status.  [name] [name] [place] Certified Pharmacy Technician [place] Management Direct [phone]

## 2019-12-14 ENCOUNTER — Encounter (INDEPENDENT_AMBULATORY_CARE_PROVIDER_SITE_OTHER): Payer: Self-pay

## 2019-12-14 ENCOUNTER — Ambulatory Visit (HOSPITAL_COMMUNITY): Payer: Medicare HMO | Attending: Family Medicine

## 2019-12-14 ENCOUNTER — Other Ambulatory Visit: Payer: Self-pay

## 2019-12-14 DIAGNOSIS — R0602 Shortness of breath: Secondary | ICD-10-CM | POA: Insufficient documentation

## 2019-12-14 DIAGNOSIS — I517 Cardiomegaly: Secondary | ICD-10-CM | POA: Insufficient documentation

## 2019-12-14 MED ORDER — PERFLUTREN LIPID MICROSPHERE
1.0000 mL | INTRAVENOUS | Status: AC | PRN
  Administered 2019-12-14: 10:00:00 1 mL via INTRAVENOUS

## 2019-12-16 DIAGNOSIS — H3582 Retinal ischemia: Secondary | ICD-10-CM | POA: Diagnosis not present

## 2019-12-16 DIAGNOSIS — H43811 Vitreous degeneration, right eye: Secondary | ICD-10-CM | POA: Diagnosis not present

## 2019-12-16 DIAGNOSIS — E113593 Type 2 diabetes mellitus with proliferative diabetic retinopathy without macular edema, bilateral: Secondary | ICD-10-CM | POA: Diagnosis not present

## 2019-12-16 DIAGNOSIS — H43821 Vitreomacular adhesion, right eye: Secondary | ICD-10-CM | POA: Diagnosis not present

## 2019-12-21 ENCOUNTER — Telehealth: Payer: Self-pay

## 2019-12-21 NOTE — Telephone Encounter (Signed)
Returned pt call re: EKG results from 1/202021. Advised normal sinus rhythm and normal ekg. Pt advises she is not computer literate and was unable to see her results so she decided she'd call office to get her results. Advised if she has any further questions or concerns don't hesitate to contact office.  Pt agreeable.

## 2020-01-02 ENCOUNTER — Ambulatory Visit (INDEPENDENT_AMBULATORY_CARE_PROVIDER_SITE_OTHER): Payer: Medicare HMO | Admitting: Family Medicine

## 2020-01-02 VITALS — BP 118/60 | Ht 63.0 in | Wt 189.0 lb

## 2020-01-02 DIAGNOSIS — Z0000 Encounter for general adult medical examination without abnormal findings: Secondary | ICD-10-CM

## 2020-01-02 NOTE — Patient Instructions (Signed)
Thank you for taking time to come for your Medicare Wellness Visit. I appreciate your ongoing commitment to your health goals. Please review the following plan we discussed and let me know if I can assist you in the future.  Bridget Kreitzer LPN  Preventive Care 65 Years and Older, Female Preventive care refers to lifestyle choices and visits with your health care provider that can promote health and wellness. This includes:  A yearly physical exam. This is also called an annual well check.  Regular dental and eye exams.  Immunizations.  Screening for certain conditions.  Healthy lifestyle choices, such as diet and exercise. What can I expect for my preventive care visit? Physical exam Your health care provider will check:  Height and weight. These may be used to calculate body mass index (BMI), which is a measurement that tells if you are at a healthy weight.  Heart rate and blood pressure.  Your skin for abnormal spots. Counseling Your health care provider may ask you questions about:  Alcohol, tobacco, and drug use.  Emotional well-being.  Home and relationship well-being.  Sexual activity.  Eating habits.  History of falls.  Memory and ability to understand (cognition).  Work and work environment.  Pregnancy and menstrual history. What immunizations do I need?  Influenza (flu) vaccine  This is recommended every year. Tetanus, diphtheria, and pertussis (Tdap) vaccine  You may need a Td booster every 10 years. Varicella (chickenpox) vaccine  You may need this vaccine if you have not already been vaccinated. Zoster (shingles) vaccine  You may need this after age 60. Pneumococcal conjugate (PCV13) vaccine  One dose is recommended after age 65. Pneumococcal polysaccharide (PPSV23) vaccine  One dose is recommended after age 65. Measles, mumps, and rubella (MMR) vaccine  You may need at least one dose of MMR if you were born in 1957 or later. You may also  need a second dose. Meningococcal conjugate (MenACWY) vaccine  You may need this if you have certain conditions. Hepatitis A vaccine  You may need this if you have certain conditions or if you travel or work in places where you may be exposed to hepatitis A. Hepatitis B vaccine  You may need this if you have certain conditions or if you travel or work in places where you may be exposed to hepatitis B. Haemophilus influenzae type b (Hib) vaccine  You may need this if you have certain conditions. You may receive vaccines as individual doses or as more than one vaccine together in one shot (combination vaccines). Talk with your health care provider about the risks and benefits of combination vaccines. What tests do I need? Blood tests  Lipid and cholesterol levels. These may be checked every 5 years, or more frequently depending on your overall health.  Hepatitis C test.  Hepatitis B test. Screening  Lung cancer screening. You may have this screening every year starting at age 55 if you have a 30-pack-year history of smoking and currently smoke or have quit within the past 15 years.  Colorectal cancer screening. All adults should have this screening starting at age 50 and continuing until age 75. Your health care provider may recommend screening at age 45 if you are at increased risk. You will have tests every 1-10 years, depending on your results and the type of screening test.  Diabetes screening. This is done by checking your blood sugar (glucose) after you have not eaten for a while (fasting). You may have this done every 1-3   years.  Mammogram. This may be done every 1-2 years. Talk with your health care provider about how often you should have regular mammograms.  BRCA-related cancer screening. This may be done if you have a family history of breast, ovarian, tubal, or peritoneal cancers. Other tests  Sexually transmitted disease (STD) testing.  Bone density scan. This is done  to screen for osteoporosis. You may have this done starting at age 94. Follow these instructions at home: Eating and drinking  Eat a diet that includes fresh fruits and vegetables, whole grains, lean protein, and low-fat dairy products. Limit your intake of foods with high amounts of sugar, saturated fats, and salt.  Take vitamin and mineral supplements as recommended by your health care provider.  Do not drink alcohol if your health care provider tells you not to drink.  If you drink alcohol: ? Limit how much you have to 0-1 drink a day. ? Be aware of how much alcohol is in your drink. In the [place] one drink equals one 12 oz bottle of beer (355 mL), one 5 oz glass of wine (148 mL), or one 1 oz glass of hard liquor (44 mL). Lifestyle  Take daily care of your teeth and gums.  Stay active. Exercise for at least 30 minutes on 5 or more days each week.  Do not use any products that contain nicotine or tobacco, such as cigarettes, e-cigarettes, and chewing tobacco. If you need help quitting, ask your health care provider.  If you are sexually active, practice safe sex. Use a condom or other form of protection in order to prevent STIs (sexually transmitted infections).  Talk with your health care provider about taking a low-dose aspirin or statin. What's next?  Go to your health care provider once a year for a well check visit.  Ask your health care provider how often you should have your eyes and teeth checked.  Stay up to date on all vaccines. This information is not intended to replace advice given to you by your health care provider. Make sure you discuss any questions you have with your health care provider. Document Revised: 09/30/2018 Document Reviewed: 09/30/2018 Elsevier Patient Education  2020 [employer].

## 2020-01-02 NOTE — Progress Notes (Signed)
Presents today for [employer] Visit   Date of last exam:  12/05/2019  Interpreter used for this visit?   No  I connected with  Gerlean Ren Wigington on 01/02/20 by a telephone  and verified that I am speaking with the correct person using two identifiers.   I discussed the limitations of evaluation and management by telemedicine. The patient expressed understanding and agreed to proceed.    Patient Care Team: Rutherford Guys, MD as PCP - General (Family Medicine) Sherlynn Stalls, MD as Consulting Physician (Ophthalmology) Philemon Kingdom, MD as Consulting Physician (Internal Medicine) Rudean Haskell, [facility] as [place] Management (Pharmacist) Adaline Sill, CPhT as [place] Management (Pharmacy Technician)   Other items to address today:   Patient had macular surgery  Seen every 6 months [place]. Discussed immunizations Follow up scheduled 8/16@2 :00  Patient will stop taking Lisinopril and start Losartan/htz   Other Screening: Last screening for diabetes: 12/05/2019 Last lipid screening:  09/02/2019  ADVANCE DIRECTIVES: Discussed: yes On File: no Materials Provided: yes  Immunization status:  Immunization History  Administered Date(s) Administered  . Influenza, High Dose Seasonal PF 07/27/2017, 07/08/2018, 06/19/2019  . PFIZER SARS-COV-2 Vaccination 11/08/2019, 11/29/2019  . Pneumococcal Conjugate-13 09/02/2019     Health Maintenance Due  Topic Date Due  . TETANUS/TDAP  Never done     Functional Status Survey: Is the patient deaf or have difficulty hearing?: Yes(uses closed caption on tv  audiolgist reccomend hearing aids.  does not have hearing to expensive) Does the patient have difficulty seeing, even when wearing glasses/contacts?: No Does the patient have difficulty concentrating, remembering, or making decisions?: No Does the patient have difficulty  walking or climbing stairs?: Yes(gets short of breath) Does the patient have difficulty dressing or bathing?: No Does the patient have difficulty doing errands alone such as visiting a doctor's office or shopping?: No   6CIT Screen 01/02/2020  What Year? 0 points  What month? 0 points  What time? 0 points  Count back from 20 0 points  Months in reverse 2 points  Repeat phrase 0 points  Total Score 2        Clinical Support from 01/02/2020 in [facility]  AUDIT-C Score  0       Home Environment:    Lives in a one story home with son No scattered rugs Yes grab bars Adequate lighting/no clutter Yes trouble climbing stairs (short of breath)       Patient Active Problem List   Diagnosis Date Noted  . Abnormal thyroid stimulating hormone (TSH) level 04/23/2018  . Class 2 severe obesity due to excess calories with serious comorbidity and body mass index (BMI) of 35.0 to 35.9 in adult ([place]) 03/29/2018  . Insulin dependent type 2 diabetes mellitus, uncontrolled ([place]) 01/31/2018  . Essential hypertension 01/31/2018  . Hyperlipidemia LDL goal <100 01/31/2018  . Gastroesophageal reflux disease 01/31/2018  . Osteopenia of multiple sites 12/21/2015     Past Medical History:  Diagnosis Date  . Cataract   . Diabetes mellitus without complication ([place])   . Thyroid disease      Past Surgical History:  Procedure Laterality Date  . COSMETIC SURGERY    . GALLBLADDER SURGERY    . TUBAL LIGATION       Family History  Problem Relation Age of Onset  . Heart disease Mother   . Hypertension Mother   .  Cancer Sister   . Diabetes Sister      Social History   Socioeconomic History  . Marital status: Widowed    Spouse name: Not on file  . Number of children: 3  . Years of education: Not on file  . Highest education level: Not on file  Occupational History  . Not on file  Tobacco Use  . Smoking status: Never Smoker  . Smokeless tobacco: Never Used   Substance and Sexual Activity  . Alcohol use: No  . Drug use: No  . Sexual activity: Not on file  Other Topics Concern  . Not on file  Social History Narrative   Mrs Desaulniers is a 74 year old Jehovah witness patient    She receives support from her daughter primarily who also assists with transportation to medical appointments.    She is presently independent with her ADLs and iADLs      Social Determinants of Health   Financial Resource Strain:   . Difficulty of Paying Living Expenses:   Food Insecurity: No Food Insecurity  . Worried About Charity fundraiser in the Last Year: Never true  . Ran Out of Food in the Last Year: Never true  Transportation Needs: No Transportation Needs  . Lack of Transportation (Medical): No  . Lack of Transportation (Non-Medical): No  Physical Activity: Inactive  . Days of Exercise per Week: 0 days  . Minutes of Exercise per Session: 0 min  Stress: No Stress Concern Present  . Feeling of Stress : Not at all  Social Connections:   . Frequency of Communication with Friends and Family:   . Frequency of Social Gatherings with Friends and Family:   . Attends Religious Services:   . Active Member of Clubs or Organizations:   . Attends Archivist Meetings:   [name] Kitchen Marital Status:   Intimate Partner Violence:   . Fear of Current or Ex-Partner:   . Emotionally Abused:   [name] Kitchen Physically Abused:   . Sexually Abused:      No Known Allergies   Prior to Admission medications   Medication Sig Start Date End Date Taking? Authorizing Provider  aspirin EC 81 MG tablet Take 81 mg by mouth daily.   Yes [provider]  Blood Glucose Monitoring Suppl (TRUE METRIX METER) DEVI 1 Device by Does not apply route 2 (two) times daily. 07/12/18  Yes Timmothy Euler, [place] D, PA-C  Dulaglutide (TRULICITY) 5.32 DJ/2.4QA SOPN Inject 0.5 mLs into the skin once a week. 09/29/19  Yes Rutherford Guys, MD  glucose blood (TRUE METRIX BLOOD GLUCOSE TEST) test strip Use  as instructed 07/12/18  Yes Timmothy Euler, [place] D, PA-C  Insulin Glargine (BASAGLAR KWIKPEN) 100 UNIT/ML SOPN Inject 0.2 mLs (20 Units total) into the skin at bedtime. 11/16/19  Yes Rutherford Guys, MD  Insulin Pen Needle (PEN NEEDLES) 32G X 5 MM MISC 1 Units by Does not apply route daily. 07/08/18  Yes Shawnee Knapp, MD  losartan-hydrochlorothiazide (HYZAAR) 50-12.5 MG tablet Take 1 tablet by mouth daily. 12/05/19  Yes Rutherford Guys, MD  pravastatin (PRAVACHOL) 40 MG tablet Take 1 tablet (40 mg total) by mouth daily. 06/24/19  Yes Rutherford Guys, MD  sitaGLIPtin-metformin (JANUMET) 50-1000 MG tablet Take 1 tablet by mouth daily. 07/07/19  Yes Rutherford Guys, MD  Tetrahydrozoline HCl (VISINE OP) Apply 1 drop to eye daily as needed (dry eyes).   Yes [provider]  Vitamin D, Ergocalciferol, (DRISDOL) 1.25 MG (50000  UT) CAPS capsule Take 1 capsule (50,000 Units total) by mouth every 7 (seven) days. 06/24/19  Yes Rutherford Guys, MD  omeprazole (PRILOSEC) 40 MG capsule Take 40 mg by mouth daily.    [provider]     Depression screen [facility] 2/9 01/02/2020 12/05/2019 06/08/2019 06/03/2019 09/20/2018  Decreased Interest 0 0 0 0 0  Down, Depressed, Hopeless 0 0 0 0 0  PHQ - 2 Score 0 0 0 0 0     Fall Risk  01/02/2020 12/05/2019 06/08/2019 06/03/2019 09/20/2018  Falls in the past year? 0 0 1 0 0  Number falls in past yr: 0 0 0 0 -  Injury with Fall? 0 0 0 0 -  Risk for fall due to : - - History of fall(s) - -  Follow up Falls evaluation completed;Education provided - Falls prevention discussed Falls evaluation completed -      PHYSICAL EXAM: BP 118/60 Comment: taken from previous visit  Ht 5\' 3"  (1.6 m)   Wt 189 lb (85.7 kg)   BMI 33.48 kg/m    Wt Readings from Last 3 Encounters:  01/02/20 189 lb (85.7 kg)  12/05/19 189 lb (85.7 kg)  09/02/19 189 lb (85.7 kg)    Medicare annual wellness visit, subsequent   Education/Counseling provided regarding diet and exercise,  prevention of chronic diseases, smoking/tobacco cessation, if applicable, and reviewed "Covered Medicare Preventive Services."

## 2020-01-04 ENCOUNTER — Ambulatory Visit: Payer: Medicare HMO | Admitting: Critical Care Medicine

## 2020-01-04 ENCOUNTER — Other Ambulatory Visit: Payer: Self-pay

## 2020-01-04 ENCOUNTER — Encounter: Payer: Self-pay | Admitting: Critical Care Medicine

## 2020-01-04 ENCOUNTER — Other Ambulatory Visit: Payer: Self-pay | Admitting: Pharmacy Technician

## 2020-01-04 ENCOUNTER — Ambulatory Visit (HOSPITAL_COMMUNITY)
Admission: RE | Admit: 2020-01-04 | Discharge: 2020-01-04 | Disposition: A | Discharge status: Home / Self Care | Payer: Medicare HMO | Source: Ambulatory Visit | Attending: Critical Care Medicine | Admitting: Critical Care Medicine

## 2020-01-04 VITALS — BP 142/62 | HR 92 | Temp 97.1°F | Ht 63.0 in | Wt 191.0 lb

## 2020-01-04 DIAGNOSIS — R062 Wheezing: Secondary | ICD-10-CM | POA: Diagnosis not present

## 2020-01-04 DIAGNOSIS — R061 Stridor: Secondary | ICD-10-CM | POA: Diagnosis not present

## 2020-01-04 DIAGNOSIS — J398 Other specified diseases of upper respiratory tract: Secondary | ICD-10-CM | POA: Diagnosis not present

## 2020-01-04 DIAGNOSIS — E042 Nontoxic multinodular goiter: Secondary | ICD-10-CM | POA: Diagnosis not present

## 2020-01-04 LAB — BASIC METABOLIC PANEL
BUN: 15 mg/dL (ref 6–23)
CO2: 31 mEq/L (ref 19–32)
Calcium: 9.6 mg/dL (ref 8.4–10.5)
Chloride: 101 mEq/L (ref 96–112)
Creatinine, Ser: 1.13 mg/dL (ref 0.40–1.20)
GFR: 57 mL/min — ABNORMAL LOW (ref >60.00)
Glucose, Bld: 200 mg/dL — ABNORMAL HIGH (ref 70–99)
Potassium: 3.8 mEq/L (ref 3.5–5.1)
Sodium: 138 mEq/L (ref 135–145)

## 2020-01-04 MED ORDER — SODIUM CHLORIDE (PF) 0.9 % IJ SOLN
INTRAMUSCULAR | Status: AC
  Filled 2020-01-04: qty 50

## 2020-01-04 MED ORDER — IOHEXOL 300 MG/ML  SOLN
75.0000 mL | Freq: Once | INTRAMUSCULAR | Status: AC | PRN
  Administered 2020-01-04: 75 mL via INTRAVENOUS

## 2020-01-04 NOTE — Progress Notes (Signed)
Synopsis: Referred in March 2021 for wheezing by Rutherford Guys, MD  Subjective:   PATIENT ID: Bridget Butler GENDER: female DOB: September 03, 1946, MRN: [id number]  Chief Complaint  Patient presents with  . Consult    wheezing, worse with movement.  feels something in throat, unable to cough up.  Sob with walking.    Bridget Butler is a 74 year old woman who presents for evaluation of wheezing for the past year.  She describes it as a sensation in the bottom of her right throat which is now moved into her chest.  It feels like she needs to cough something up but is unable to.  Her children are concerned about her breathing and she has been told that people on the other end of the family can hear how loud her breathing is.  Her symptoms are worse with speaking for a long time, having to walk up the stairs, or if she gets choked on something.  This is improved with deep breathing and using Vicks VapoRub.  She has now developed shortness of breath associated with it and has activity limitations due to her shortness of breath.  She does not have hoarseness, coughing, dysphagia or odynophagia.  No pain is associated with this.  No neck masses.  Her symptoms are no worse in certain positions; she is able to lay flat.  There is no difference in her symptoms between inspiration or expiration.  No previous intubations other than surgeries; most recent surgery was colonoscopy.  Gallbladder removal was many years ago.  She has a history of thyroid nodules which have not required intervention in the past.  She has never smoked or vaped.  She has no personal or family history of lung disease.  Trial of inhalers has not been helpful.  PCP notes from 12/05/2019, Dr. Arnetha Massy ago reviewed.     Past Medical History:  Diagnosis Date  . Cataract   . Diabetes mellitus without complication ([place])   . Hyperlipidemia   . Hypertension   . Multiple thyroid nodules   . Thyroid disease      Family History  Problem Relation  Age of Onset  . Heart disease Mother   . Hypertension Mother   . Cancer Sister   . Diabetes Sister      Past Surgical History:  Procedure Laterality Date  . COSMETIC SURGERY Bilateral    eyelids  . GALLBLADDER SURGERY    . TUBAL LIGATION      Social History   Socioeconomic History  . Marital status: Widowed    Spouse name: Not on file  . Number of children: 3  . Years of education: Not on file  . Highest education level: Not on file  Occupational History  . Not on file  Tobacco Use  . Smoking status: Never Smoker  . Smokeless tobacco: Never Used  Substance and Sexual Activity  . Alcohol use: No  . Drug use: No  . Sexual activity: Not on file  Other Topics Concern  . Not on file  Social History Narrative   Bridget Butler is a 74 year old Jehovah witness patient    She receives support from her daughter primarily who also assists with transportation to medical appointments.    She is presently independent with her ADLs and iADLs      Social Determinants of Health   Financial Resource Strain:   . Difficulty of Paying Living Expenses:   Food Insecurity: No Food Insecurity  . Worried About Running  Out of Food in the Last Year: Never true  . Ran Out of Food in the Last Year: Never true  Transportation Needs: No Transportation Needs  . Lack of Transportation (Medical): No  . Lack of Transportation (Non-Medical): No  Physical Activity: Inactive  . Days of Exercise per Week: 0 days  . Minutes of Exercise per Session: 0 min  Stress: No Stress Concern Present  . Feeling of Stress : Not at all  Social Connections:   . Frequency of Communication with Friends and Family:   . Frequency of Social Gatherings with Friends and Family:   . Attends Religious Services:   . Active Member of Clubs or Organizations:   . Attends Archivist Meetings:   [name] Kitchen Marital Status:   Intimate Partner Violence:   . Fear of Current or Ex-Partner:   . Emotionally Abused:   [name] Kitchen Physically  Abused:   . Sexually Abused:      No Known Allergies   Immunization History  Administered Date(s) Administered  . Influenza, High Dose Seasonal PF 07/27/2017, 07/08/2018, 06/19/2019  . PFIZER SARS-COV-2 Vaccination 11/08/2019, 11/29/2019  . Pneumococcal Conjugate-13 09/02/2019    Outpatient Medications Prior to Visit  Medication Sig Dispense Refill  . aspirin EC 81 MG tablet Take 81 mg by mouth daily.    . Blood Glucose Monitoring Suppl (TRUE METRIX METER) DEVI 1 Device by Does not apply route 2 (two) times daily. 1 Device 0  . Dulaglutide (TRULICITY) 3.26 ZT/2.4PY SOPN Inject 0.5 mLs into the skin once a week. 4 pen 5  . glucose blood (TRUE METRIX BLOOD GLUCOSE TEST) test strip Use as instructed 300 each 1  . Insulin Glargine (BASAGLAR KWIKPEN) 100 UNIT/ML SOPN Inject 0.2 mLs (20 Units total) into the skin at bedtime. 45 mL 3  . Insulin Pen Needle (PEN NEEDLES) 32G X 5 MM MISC 1 Units by Does not apply route daily. 100 each 5  . losartan-hydrochlorothiazide (HYZAAR) 50-12.5 MG tablet Take 1 tablet by mouth daily. 90 tablet 3  . omeprazole (PRILOSEC) 40 MG capsule Take 40 mg by mouth daily.    . pravastatin (PRAVACHOL) 40 MG tablet Take 1 tablet (40 mg total) by mouth daily. 90 tablet 3  . sitaGLIPtin-metformin (JANUMET) 50-1000 MG tablet Take 1 tablet by mouth daily. 90 tablet 1  . Tetrahydrozoline HCl (VISINE OP) Apply 1 drop to eye daily as needed (dry eyes).    . Vitamin D, Ergocalciferol, (DRISDOL) 1.25 MG (50000 UT) CAPS capsule Take 1 capsule (50,000 Units total) by mouth every 7 (seven) days. 12 capsule 0   No facility-administered medications prior to visit.    Review of Systems  Constitutional: Negative.   HENT:       No hoarseness  Respiratory: Positive for shortness of breath and wheezing. Negative for cough.   Cardiovascular: Negative for chest pain.  Gastrointestinal:       No dysphagia     Objective:   Vitals:   01/04/20 1059  BP: (!) 142/62  Pulse: 92    Temp: (!) 97.1 F (36.2 C)  TempSrc: Temporal  SpO2: 100%  Weight: 191 lb (86.6 kg)  Height: 5\' 3"  (1.6 m)   100% on   RA BMI Readings from Last 3 Encounters:  01/04/20 33.83 kg/m  01/02/20 33.48 kg/m  12/05/19 33.48 kg/m   Wt Readings from Last 3 Encounters:  01/04/20 191 lb (86.6 kg)  01/02/20 189 lb (85.7 kg)  12/05/19 189 lb (85.7 kg)    Physical  Exam Vitals reviewed.  Constitutional:      Appearance: Normal appearance. She is obese. She is not ill-appearing.  HENT:     Head: Normocephalic and atraumatic.  Eyes:     General: No scleral icterus. Neck:     Comments: R thyroid nodule Cardiovascular:     Rate and Rhythm: Regular rhythm. Tachycardia present.     Heart sounds: No murmur.  Pulmonary:     Comments: Inspiratory stridor, worse with deep inhalation.  Clear to auscultation bilaterally.  No conversational dyspnea.  No accessory muscle use. Abdominal:     General: There is no distension.  Musculoskeletal:        General: No swelling or deformity.     Cervical back: Neck supple.  Skin:    General: Skin is warm and dry.     Findings: No rash.  Neurological:     General: No focal deficit present.     Mental Status: She is alert.     Coordination: Coordination normal.  Psychiatric:        Mood and Affect: Mood normal.        Behavior: Behavior normal.      CBC    Component Value Date/Time   WBC 7.4 11/01/2018 1623   RBC 3.45 (L) 11/01/2018 1623   HGB 10.7 (L) 11/01/2018 1623   HGB 11.2 03/29/2018 0914   HCT 33.6 (L) 11/01/2018 1623   HCT 33.8 (L) 03/29/2018 0914   PLT 298 11/01/2018 1623   PLT 305 03/29/2018 0914   MCV 97.4 11/01/2018 1623   MCV 92.3 09/20/2018 1204   MCV 92 03/29/2018 0914   MCH 31.0 11/01/2018 1623   MCHC 31.8 11/01/2018 1623   RDW 13.3 11/01/2018 1623   RDW 14.5 03/29/2018 0914   LYMPHSABS 3.7 11/01/2018 1623   LYMPHSABS 2.4 03/29/2018 0914   MONOABS 0.5 11/01/2018 1623   EOSABS 0.1 11/01/2018 1623   EOSABS 0.1  03/29/2018 0914   BASOSABS 0.0 11/01/2018 1623   BASOSABS 0.0 03/29/2018 0914    CHEMISTRY No results for input(s): NA, K, CL, CO2, GLUCOSE, BUN, CREATININE, CALCIUM, MG, PHOS in the last 168 hours. CrCl cannot be calculated (Patient's most recent lab result is older than the maximum 21 days allowed.).   Chest Imaging- films reviewed: CXR, 2 view 12/05/2019- normal  NM thyroid scan 11/2018: Multinodular thyroid gland with 1 nodules bilaterally  Pulmonary Functions Testing Results: No flowsheet data found.    Echocardiogram 12/14/2019: LVEF 60 to 65%, indeterminate diastolic function.  Normal LA, RV, RA.  Trivial MR, , otherwise normal valves.      Assessment & Plan:     ICD-10-CM   1. Stridor  R06.1   2. Wheezing  Y09.9 Basic Metabolic Panel (BMET)    CT SOFT TISSUE NECK W CONTRAST    CT Chest W Contrast    Basic Metabolic Panel (BMET)    Stridor, suspect compression from her thyroid -CT chest and neck with contrast within the next 1 to 2 days. -If nothing on CT, will require urgent ENT referral to evaluate VC motion. -If she develops symptoms related to laying flat or trouble with swallowing, this needs to be dealt with urgently.  If she develops significantly worsening shortness of breath, she should present to the ED.  RTC in 1 month.   Current Outpatient Medications:  .  aspirin EC 81 MG tablet, Take 81 mg by mouth daily., Disp: , Rfl:  .  Blood Glucose Monitoring Suppl (TRUE METRIX METER)  DEVI, 1 Device by Does not apply route 2 (two) times daily., Disp: 1 Device, Rfl: 0 .  Dulaglutide (TRULICITY) 6.12 AE/4.9PN SOPN, Inject 0.5 mLs into the skin once a week., Disp: 4 pen, Rfl: 5 .  glucose blood (TRUE METRIX BLOOD GLUCOSE TEST) test strip, Use as instructed, Disp: 300 each, Rfl: 1 .  Insulin Glargine (BASAGLAR KWIKPEN) 100 UNIT/ML SOPN, Inject 0.2 mLs (20 Units total) into the skin at bedtime., Disp: 45 mL, Rfl: 3 .  Insulin Pen Needle (PEN NEEDLES) 32G X 5 MM MISC,  1 Units by Does not apply route daily., Disp: 100 each, Rfl: 5 .  losartan-hydrochlorothiazide (HYZAAR) 50-12.5 MG tablet, Take 1 tablet by mouth daily., Disp: 90 tablet, Rfl: 3 .  omeprazole (PRILOSEC) 40 MG capsule, Take 40 mg by mouth daily., Disp: , Rfl:  .  pravastatin (PRAVACHOL) 40 MG tablet, Take 1 tablet (40 mg total) by mouth daily., Disp: 90 tablet, Rfl: 3 .  sitaGLIPtin-metformin (JANUMET) 50-1000 MG tablet, Take 1 tablet by mouth daily., Disp: 90 tablet, Rfl: 1 .  Tetrahydrozoline HCl (VISINE OP), Apply 1 drop to eye daily as needed (dry eyes)., Disp: , Rfl:  .  Vitamin D, Ergocalciferol, (DRISDOL) 1.25 MG (50000 UT) CAPS capsule, Take 1 capsule (50,000 Units total) by mouth every 7 (seven) days., Disp: 12 capsule, Rfl: 0    Julian Hy, DO [facility] 01/04/2020 11:53 AM

## 2020-01-04 NOTE — Patient Instructions (Addendum)
Thank you for visiting Dr. Carlis Abbott at [facility]. We recommend the following: Orders Placed This Encounter  Procedures  . CT SOFT TISSUE NECK W CONTRAST  . CT Chest W Contrast  . Basic Metabolic Panel (BMET)   Orders Placed This Encounter  Procedures  . CT SOFT TISSUE NECK W CONTRAST    Standing Status:   Future    Standing Expiration Date:   04/05/2021    Order Specific Question:   ** REASON FOR EXAM (FREE TEXT)    Answer:   worsening stridor x 1 year, R thyroid nodule on exam    Order Specific Question:   If indicated for the ordered procedure, I authorize the administration of contrast media per Radiology protocol    Answer:   Yes    Order Specific Question:   Preferred imaging location?    Answer:   [facility]    Order Specific Question:   Radiology Contrast Protocol - do NOT remove file path    Answer:   \\[email or web address]\Radiant\CTProtocols.pdf  . CT Chest W Contrast    Standing Status:   Future    Standing Expiration Date:   03/05/2021    Order Specific Question:   ** REASON FOR EXAM (FREE TEXT)    Answer:   worsening stridor x 1 year, R thyroid nodule on exam    Order Specific Question:   If indicated for the ordered procedure, I authorize the administration of contrast media per Radiology protocol    Answer:   Yes    Order Specific Question:   Preferred imaging location?    Answer:   [facility]    Order Specific Question:   Radiology Contrast Protocol - do NOT remove file path    Answer:   \\[email or web address]\Radiant\CTProtocols.pdf  . Basic Metabolic Panel (BMET)    Standing Status:   Future    Standing Expiration Date:   01/03/2021       Return in about 1 month (around 02/04/2020).    Please do your part to reduce the spread of COVID-19.

## 2020-01-04 NOTE — Patient Outreach (Signed)
[facility]) Care Management  01/04/2020  Bridget Butler [place] Aug 23, 1946 [id number]   Follow up call placed to [employer] regarding patient assistance application(s) for Bridget Butler confirms patient has been approved as if 3/2 until 10/19/2020. [facility] to mail script to patient in the next 10-14 business days.  Follow up:  Will follow up with patient in 10-14 business days to confirm medication has been received.   [name] [name] [place] Certified Pharmacy Technician [place] Management Direct [phone]

## 2020-01-05 ENCOUNTER — Other Ambulatory Visit: Payer: Self-pay | Admitting: Family Medicine

## 2020-01-05 ENCOUNTER — Telehealth: Payer: Self-pay | Admitting: Critical Care Medicine

## 2020-01-05 DIAGNOSIS — E041 Nontoxic single thyroid nodule: Secondary | ICD-10-CM

## 2020-01-05 DIAGNOSIS — Q321 Other congenital malformations of trachea: Secondary | ICD-10-CM

## 2020-01-05 DIAGNOSIS — IMO0002 Reserved for concepts with insufficient information to code with codable children: Secondary | ICD-10-CM

## 2020-01-05 DIAGNOSIS — R061 Stridor: Secondary | ICD-10-CM

## 2020-01-05 DIAGNOSIS — E1165 Type 2 diabetes mellitus with hyperglycemia: Secondary | ICD-10-CM

## 2020-01-05 MED ORDER — TRUE METRIX BLOOD GLUCOSE TEST VI STRP: ORAL_STRIP | 1 refills | Status: DC

## 2020-01-05 NOTE — Telephone Encounter (Signed)
LM for Ms. Newhouse to discuss CT results. After discussion with Dr. Constance Holster from ENT, consult placed for thyroid biopsy and [place].  Julian Hy, DO 01/05/20 9:31 AM [place] [facility]

## 2020-01-05 NOTE — Telephone Encounter (Signed)
I spoke to Bridget Butler to discuss her CT scan results and the plan to get a thyroid ultrasound and biopsy.  She understands the concern that this could potentially be a malignancy.  We will know more when her results return.  She should stop taking her daily baby aspirin until her biopsy is completed.  Julian Hy, DO 01/05/20 10:31 AM [place] [facility]

## 2020-01-05 NOTE — Telephone Encounter (Signed)
Copied from [place] [id number]. Topic: Quick Communication - Rx Refill/Question >> Jan 05, 2020  9:57 AM Mcneil, Ja-Kwan wrote: Medication: glucose blood (TRUE METRIX BLOOD GLUCOSE TEST) test strip  Has the patient contacted their pharmacy? yes   Preferred Pharmacy (with phone number or street name): [place], [place] Phone: [phone]  Fax: [phone]  Agent: Please be advised that RX refills may take up to 3 business days. We ask that you follow-up with your pharmacy.

## 2020-01-09 ENCOUNTER — Other Ambulatory Visit: Payer: Self-pay

## 2020-01-09 ENCOUNTER — Ambulatory Visit
Admission: RE | Admit: 2020-01-09 | Discharge: 2020-01-09 | Disposition: A | Discharge status: Home / Self Care | Payer: Medicare HMO | Source: Ambulatory Visit | Attending: Critical Care Medicine | Admitting: Critical Care Medicine

## 2020-01-09 DIAGNOSIS — R061 Stridor: Secondary | ICD-10-CM

## 2020-01-09 DIAGNOSIS — E042 Nontoxic multinodular goiter: Secondary | ICD-10-CM | POA: Diagnosis not present

## 2020-01-09 DIAGNOSIS — E041 Nontoxic single thyroid nodule: Secondary | ICD-10-CM

## 2020-01-10 ENCOUNTER — Telehealth: Payer: Self-pay | Admitting: Critical Care Medicine

## 2020-01-10 DIAGNOSIS — R061 Stridor: Secondary | ICD-10-CM

## 2020-01-10 NOTE — Telephone Encounter (Signed)
[place] calling back to see what Dr.Clark would like the biopsy done on. [place] can be called at [phone]. The report is in epic. None of the modues meet criteria for a biopsy.

## 2020-01-10 NOTE — Telephone Encounter (Signed)
This should be a biopsy of her thyroid (right lower pole).  Julian Hy, DO 01/10/20 2:21 PM [facility]

## 2020-01-11 NOTE — Telephone Encounter (Signed)
Spoke with [place] at [place]. This has already been taken care of. Nothing further needed.

## 2020-01-11 NOTE — Progress Notes (Signed)
   NW 1  Rayne P. Hallett Female, 74 y.o., August 06, 1946 MRN:  [id number] Phone:  [phone] (H) PCP:  Rutherford Guys, MD Coverage:  [facility] Medicare/[employer] Medicare [place] With Radiology ([facility] [place] 3) 01/12/2020 at 2:00 PM approved for bx Received: Yesterday Message Contents  Arne Cleveland, MD  Lenore Cordia      I spoke to Dr  Madaline Brilliant for [place] FNA RLP nodule   DDH

## 2020-01-12 ENCOUNTER — Other Ambulatory Visit (HOSPITAL_COMMUNITY)
Admission: RE | Admit: 2020-01-12 | Discharge: 2020-01-12 | Disposition: A | Discharge status: Home / Self Care | Payer: Medicare HMO | Source: Ambulatory Visit | Attending: Critical Care Medicine | Admitting: Critical Care Medicine

## 2020-01-12 ENCOUNTER — Ambulatory Visit
Admission: RE | Admit: 2020-01-12 | Discharge: 2020-01-12 | Disposition: A | Discharge status: Home / Self Care | Payer: Medicare HMO | Source: Ambulatory Visit | Attending: Critical Care Medicine | Admitting: Critical Care Medicine

## 2020-01-12 DIAGNOSIS — R061 Stridor: Secondary | ICD-10-CM

## 2020-01-12 DIAGNOSIS — E041 Nontoxic single thyroid nodule: Secondary | ICD-10-CM | POA: Diagnosis not present

## 2020-01-13 LAB — CYTOLOGY - NON PAP

## 2020-01-17 ENCOUNTER — Telehealth: Payer: Self-pay | Admitting: Critical Care Medicine

## 2020-01-17 DIAGNOSIS — R061 Stridor: Secondary | ICD-10-CM

## 2020-01-17 DIAGNOSIS — Q321 Other congenital malformations of trachea: Secondary | ICD-10-CM

## 2020-01-17 NOTE — Telephone Encounter (Signed)
I attempted to call Bridget Butler to discuss her negative pathology results. I have discussed with Dr. Constance Holster from ENT is in agreement to evaluate her endoscopically to determine the best course of treatment moving forward.  Left a message and called her cell phone (no VM set up). Will try again later.  Julian Hy, DO 01/17/20 3:45 PM [place] [facility]

## 2020-01-18 NOTE — Telephone Encounter (Signed)
I attempted to call to discuss biopsy results and ENT referral, but no answer. L/M on home phone and will call back later.  Julian Hy, DO 01/18/20 12:42 PM [facility] [facility]

## 2020-02-06 ENCOUNTER — Ambulatory Visit: Payer: Medicare HMO | Admitting: Critical Care Medicine

## 2020-02-06 ENCOUNTER — Other Ambulatory Visit: Payer: Self-pay

## 2020-02-06 ENCOUNTER — Encounter: Payer: Self-pay | Admitting: Critical Care Medicine

## 2020-02-06 VITALS — BP 140/68 | HR 77 | Temp 97.7°F | Ht 63.0 in | Wt 193.4 lb

## 2020-02-06 DIAGNOSIS — R061 Stridor: Secondary | ICD-10-CM

## 2020-02-06 DIAGNOSIS — Q321 Other congenital malformations of trachea: Secondary | ICD-10-CM

## 2020-02-06 NOTE — Progress Notes (Signed)
Synopsis: Referred in March 2021 for wheezing by Rutherford Guys, MD  Subjective:   PATIENT ID: Bridget Butler GENDER: female DOB: January 10, 1946, MRN: [id number]  Chief Complaint  Patient presents with  . Follow-up    Patient feels a little better than last visit. Patient only states that she coughs when she makes herself cough.     Bridget Butler is a 74 year old woman with a history of stridor who presents for follow-up. She underwent her biopsy and scans. She is awaiting follow-up with ENT. Her dyspnea on exertion is about the same. No symptoms when recumbent. She only coughs when she feels like she has something that she needs to clear her throat, which is about the same as before. She denies epistaxis, hoarseness, swelling in her ears or the bridge of her nose, arthralgias, myalgias. She has chronic lower extremity edema which has been unchanged recently.   OV 01/04/20: Bridget. Shaft is a 74 year old woman who presents for evaluation of wheezing for the past year.  She describes it as a sensation in the bottom of her right throat which is now moved into her chest.  It feels like she needs to cough something up but is unable to.  Her children are concerned about her breathing and she has been told that people on the other end of the family can hear how loud her breathing is.  Her symptoms are worse with speaking for a long time, having to walk up the stairs, or if she gets choked on something.  This is improved with deep breathing and using Vicks VapoRub.  She has now developed shortness of breath associated with it and has activity limitations due to her shortness of breath.  She does not have hoarseness, coughing, dysphagia or odynophagia.  No pain is associated with this.  No neck masses.  Her symptoms are no worse in certain positions; she is able to lay flat.  There is no difference in her symptoms between inspiration or expiration.  No previous intubations other than surgeries; most recent surgery was  colonoscopy.  Gallbladder removal was many years ago.  She has a history of thyroid nodules which have not required intervention in the past.  She has never smoked or vaped.  She has no personal or family history of lung disease.  Trial of inhalers has not been helpful.  PCP notes from 12/05/2019, Dr. Arnetha Massy ago reviewed.    Past Medical History:  Diagnosis Date  . Cataract   . Diabetes mellitus without complication ([place])   . Hyperlipidemia   . Hypertension   . Multiple thyroid nodules   . Thyroid disease      Family History  Problem Relation Age of Onset  . Heart disease Mother   . Hypertension Mother   . Cancer Sister   . Diabetes Sister      Past Surgical History:  Procedure Laterality Date  . COSMETIC SURGERY Bilateral    eyelids  . GALLBLADDER SURGERY    . TUBAL LIGATION      Social History   Socioeconomic History  . Marital status: Widowed    Spouse name: Not on file  . Number of children: 3  . Years of education: Not on file  . Highest education level: Not on file  Occupational History  . Not on file  Tobacco Use  . Smoking status: Never Smoker  . Smokeless tobacco: Never Used  Substance and Sexual Activity  . Alcohol use: No  . Drug use: No  .  Sexual activity: Not on file  Other Topics Concern  . Not on file  Social History Narrative   Bridget Butler is a 74 year old Jehovah witness patient    She receives support from her daughter primarily who also assists with transportation to medical appointments.    She is presently independent with her ADLs and iADLs      Social Determinants of Health   Financial Resource Strain:   . Difficulty of Paying Living Expenses:   Food Insecurity: No Food Insecurity  . Worried About Charity fundraiser in the Last Year: Never true  . Ran Out of Food in the Last Year: Never true  Transportation Needs: No Transportation Needs  . Lack of Transportation (Medical): No  . Lack of Transportation (Non-Medical): No    Physical Activity: Inactive  . Days of Exercise per Week: 0 days  . Minutes of Exercise per Session: 0 min  Stress: No Stress Concern Present  . Feeling of Stress : Not at all  Social Connections:   . Frequency of Communication with Friends and Family:   . Frequency of Social Gatherings with Friends and Family:   . Attends Religious Services:   . Active Member of Clubs or Organizations:   . Attends Archivist Meetings:   [name] Kitchen Marital Status:   Intimate Partner Violence:   . Fear of Current or Ex-Partner:   . Emotionally Abused:   [name] Kitchen Physically Abused:   . Sexually Abused:      No Known Allergies   Immunization History  Administered Date(s) Administered  . Influenza, High Dose Seasonal PF 07/27/2017, 07/08/2018, 06/19/2019  . PFIZER SARS-COV-2 Vaccination 11/08/2019, 11/29/2019  . Pneumococcal Conjugate-13 09/02/2019    Outpatient Medications Prior to Visit  Medication Sig Dispense Refill  . aspirin EC 81 MG tablet Take 81 mg by mouth daily.    . Blood Glucose Monitoring Suppl (TRUE METRIX METER) DEVI 1 Device by Does not apply route 2 (two) times daily. 1 Device 0  . Dulaglutide (TRULICITY) 9.92 EQ/6.8TM SOPN Inject 0.5 mLs into the skin once a week. 4 pen 5  . glucose blood (TRUE METRIX BLOOD GLUCOSE TEST) test strip Use as instructed 300 each 1  . Insulin Glargine (BASAGLAR KWIKPEN) 100 UNIT/ML SOPN Inject 0.2 mLs (20 Units total) into the skin at bedtime. 45 mL 3  . Insulin Pen Needle (PEN NEEDLES) 32G X 5 MM MISC 1 Units by Does not apply route daily. 100 each 5  . losartan-hydrochlorothiazide (HYZAAR) 50-12.5 MG tablet Take 1 tablet by mouth daily. 90 tablet 3  . omeprazole (PRILOSEC) 40 MG capsule Take 40 mg by mouth daily.    . pravastatin (PRAVACHOL) 40 MG tablet Take 1 tablet (40 mg total) by mouth daily. 90 tablet 3  . sitaGLIPtin-metformin (JANUMET) 50-1000 MG tablet Take 1 tablet by mouth daily. 90 tablet 1  . Tetrahydrozoline HCl (VISINE OP) Apply 1 drop  to eye daily as needed (dry eyes).    . Vitamin D, Ergocalciferol, (DRISDOL) 1.25 MG (50000 UT) CAPS capsule Take 1 capsule (50,000 Units total) by mouth every 7 (seven) days. 12 capsule 0   No facility-administered medications prior to visit.    Review of Systems  Constitutional: Negative.   HENT:       No hoarseness  Respiratory: Positive for shortness of breath and wheezing. Negative for cough.   Cardiovascular: Negative for chest pain.  Gastrointestinal:       No dysphagia     Objective:  Vitals:   02/06/20 1528  BP: 140/68  Pulse: 77  Temp: 97.7 F (36.5 C)  TempSrc: Temporal  SpO2: 96%  Weight: 193 lb 6.4 oz (87.7 kg)  Height: 5\' 3"  (1.6 m)   96% on   RA BMI Readings from Last 3 Encounters:  02/06/20 34.26 kg/m  01/04/20 33.83 kg/m  01/02/20 33.48 kg/m   Wt Readings from Last 3 Encounters:  02/06/20 193 lb 6.4 oz (87.7 kg)  01/04/20 191 lb (86.6 kg)  01/02/20 189 lb (85.7 kg)    Physical Exam Vitals reviewed.  Constitutional:      Appearance: She is not ill-appearing.  HENT:     Head: Normocephalic and atraumatic.  Eyes:     General: No scleral icterus. Cardiovascular:     Rate and Rhythm: Normal rate and regular rhythm.     Heart sounds: No murmur.  Pulmonary:     Comments: Ongoing mild stridor. Clear to auscultation bilaterally. Breathing comfortably on room air. No accessory muscle use. Abdominal:     General: There is no distension.     Palpations: Abdomen is soft.  Musculoskeletal:        General: No deformity.     Cervical back: Neck supple.     Comments: Mild symmetric lower extremity edema to the lower shins.  Lymphadenopathy:     Cervical: No cervical adenopathy.  Skin:    General: Skin is warm.     Findings: No rash.  Neurological:     General: No focal deficit present.     Mental Status: She is alert.     Coordination: Coordination normal.  Psychiatric:        Mood and Affect: Mood normal.        Behavior: Behavior normal.       CBC    Component Value Date/Time   WBC 7.4 11/01/2018 1623   RBC 3.45 (L) 11/01/2018 1623   HGB 10.7 (L) 11/01/2018 1623   HGB 11.2 03/29/2018 0914   HCT 33.6 (L) 11/01/2018 1623   HCT 33.8 (L) 03/29/2018 0914   PLT 298 11/01/2018 1623   PLT 305 03/29/2018 0914   MCV 97.4 11/01/2018 1623   MCV 92.3 09/20/2018 1204   MCV 92 03/29/2018 0914   MCH 31.0 11/01/2018 1623   MCHC 31.8 11/01/2018 1623   RDW 13.3 11/01/2018 1623   RDW 14.5 03/29/2018 0914   LYMPHSABS 3.7 11/01/2018 1623   LYMPHSABS 2.4 03/29/2018 0914   MONOABS 0.5 11/01/2018 1623   EOSABS 0.1 11/01/2018 1623   EOSABS 0.1 03/29/2018 0914   BASOSABS 0.0 11/01/2018 1623   BASOSABS 0.0 03/29/2018 0914    CHEMISTRY No results for input(s): NA, K, CL, CO2, GLUCOSE, BUN, CREATININE, CALCIUM, MG, PHOS in the last 168 hours. CrCl cannot be calculated (Patient's most recent lab result is older than the maximum 21 days allowed.).   Chest Imaging- films reviewed: CXR, 2 view 12/05/2019- normal  NM thyroid scan 11/2018: Multinodular thyroid gland with 1 nodules bilaterally  CT chest with contrast 01/04/2020- soft tissue tracheal webs and anterior tracheal thickening in upper trachea at thoracic inlet.  Left thyroid nodule   Pulmonary Functions Testing Results: No flowsheet data found.   Echocardiogram 12/14/2019: LVEF 60 to 65%, indeterminate diastolic function.  Normal LA, RV, RA.  Trivial MR, otherwise normal valves.      Assessment & Plan:     ICD-10-CM   1. Stridor  R06.1   2. 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 Constance Holster in ENT. They attempted to call her today to schedule an appointment. She was provided their phone number to call back. -Discussed indications for follow-up for emergent ED evaluation, especially if she develops progressive shortness of breath.  RTC in 2 months.   Current Outpatient Medications:  .   aspirin EC 81 MG tablet, Take 81 mg by mouth daily., Disp: , Rfl:  .  Blood Glucose Monitoring Suppl (TRUE METRIX METER) DEVI, 1 Device by Does not apply route 2 (two) times daily., Disp: 1 Device, Rfl: 0 .  Dulaglutide (TRULICITY) 3.81 RR/1.1AF SOPN, Inject 0.5 mLs into the skin once a week., Disp: 4 pen, Rfl: 5 .  glucose blood (TRUE METRIX BLOOD GLUCOSE TEST) test strip, Use as instructed, Disp: 300 each, Rfl: 1 .  Insulin Glargine (BASAGLAR KWIKPEN) 100 UNIT/ML SOPN, Inject 0.2 mLs (20 Units total) into the skin at bedtime., Disp: 45 mL, Rfl: 3 .  Insulin Pen Needle (PEN NEEDLES) 32G X 5 MM MISC, 1 Units by Does not apply route daily., Disp: 100 each, Rfl: 5 .  losartan-hydrochlorothiazide (HYZAAR) 50-12.5 MG tablet, Take 1 tablet by mouth daily., Disp: 90 tablet, Rfl: 3 .  omeprazole (PRILOSEC) 40 MG capsule, Take 40 mg by mouth daily., Disp: , Rfl:  .  pravastatin (PRAVACHOL) 40 MG tablet, Take 1 tablet (40 mg total) by mouth daily., Disp: 90 tablet, Rfl: 3 .  sitaGLIPtin-metformin (JANUMET) 50-1000 MG tablet, Take 1 tablet by mouth daily., Disp: 90 tablet, Rfl: 1 .  Tetrahydrozoline HCl (VISINE OP), Apply 1 drop to eye daily as needed (dry eyes)., Disp: , Rfl:  .  Vitamin D, Ergocalciferol, (DRISDOL) 1.25 MG (50000 UT) CAPS capsule, Take 1 capsule (50,000 Units total) by mouth every 7 (seven) days., Disp: 12 capsule, Rfl: 0    Julian Hy, DO [place] [facility] 02/06/2020 3:42 PM

## 2020-02-06 NOTE — Patient Instructions (Addendum)
Thank you for visiting Dr. Carlis Abbott at [facility]. We recommend the following:  Return in about 8 weeks (around 04/02/2020).   Dr. Constance Holster (ENT/ Otolaryngology) [place], [place], [place] [address] [phone]    Please do your part to reduce the spread of COVID-19.

## 2020-02-17 ENCOUNTER — Other Ambulatory Visit: Payer: Self-pay | Admitting: Pharmacist

## 2020-02-17 NOTE — Patient Outreach (Signed)
[facility] ([facility])  [facility] Quality Pharmacy Team    [facility] pharmacy case will be closed as our team is transitioning from the [facility] Care Management Department into the [facility] Quality Department and will no longer be using [facility] for documentation purposes.   [facility] pharmacy technician will continue to assist patient with medication assistance program applications until complete.     Merlene Dante, PharmD, BCPS Clinical Pharmacist [facility] [phone]      

## 2020-03-22 DIAGNOSIS — J343 Hypertrophy of nasal turbinates: Secondary | ICD-10-CM | POA: Diagnosis not present

## 2020-03-22 DIAGNOSIS — J386 Stenosis of larynx: Secondary | ICD-10-CM | POA: Diagnosis not present

## 2020-03-22 DIAGNOSIS — R061 Stridor: Secondary | ICD-10-CM | POA: Insufficient documentation

## 2020-03-26 ENCOUNTER — Other Ambulatory Visit: Payer: Self-pay | Admitting: Otolaryngology

## 2020-03-29 ENCOUNTER — Encounter (HOSPITAL_COMMUNITY): Payer: Self-pay

## 2020-03-29 ENCOUNTER — Other Ambulatory Visit (HOSPITAL_COMMUNITY)
Admission: RE | Admit: 2020-03-29 | Discharge: 2020-03-29 | Disposition: A | Discharge status: Home / Self Care | Payer: Medicare HMO | Source: Ambulatory Visit | Attending: Otolaryngology | Admitting: Otolaryngology

## 2020-03-29 ENCOUNTER — Other Ambulatory Visit: Payer: Self-pay

## 2020-03-29 ENCOUNTER — Encounter (HOSPITAL_COMMUNITY)
Admission: RE | Admit: 2020-03-29 | Discharge: 2020-03-29 | Disposition: A | Discharge status: Home / Self Care | Payer: Medicare HMO | Source: Ambulatory Visit | Attending: Otolaryngology | Admitting: Otolaryngology

## 2020-03-29 DIAGNOSIS — Z79899 Other long term (current) drug therapy: Secondary | ICD-10-CM | POA: Diagnosis not present

## 2020-03-29 DIAGNOSIS — Z01818 Encounter for other preprocedural examination: Secondary | ICD-10-CM | POA: Insufficient documentation

## 2020-03-29 DIAGNOSIS — E782 Mixed hyperlipidemia: Secondary | ICD-10-CM | POA: Insufficient documentation

## 2020-03-29 DIAGNOSIS — Z20822 Contact with and (suspected) exposure to covid-19: Secondary | ICD-10-CM | POA: Diagnosis not present

## 2020-03-29 DIAGNOSIS — R061 Stridor: Secondary | ICD-10-CM | POA: Diagnosis not present

## 2020-03-29 DIAGNOSIS — E079 Disorder of thyroid, unspecified: Secondary | ICD-10-CM | POA: Insufficient documentation

## 2020-03-29 DIAGNOSIS — I10 Essential (primary) hypertension: Secondary | ICD-10-CM | POA: Diagnosis not present

## 2020-03-29 DIAGNOSIS — Z7901 Long term (current) use of anticoagulants: Secondary | ICD-10-CM | POA: Diagnosis not present

## 2020-03-29 DIAGNOSIS — Z7982 Long term (current) use of aspirin: Secondary | ICD-10-CM | POA: Diagnosis not present

## 2020-03-29 DIAGNOSIS — E119 Type 2 diabetes mellitus without complications: Secondary | ICD-10-CM | POA: Diagnosis not present

## 2020-03-29 DIAGNOSIS — Z794 Long term (current) use of insulin: Secondary | ICD-10-CM | POA: Insufficient documentation

## 2020-03-29 DIAGNOSIS — K219 Gastro-esophageal reflux disease without esophagitis: Secondary | ICD-10-CM | POA: Diagnosis not present

## 2020-03-29 HISTORY — DX: Gastro-esophageal reflux disease without esophagitis: K21.9

## 2020-03-29 HISTORY — DX: Dyspnea, unspecified: R06.00

## 2020-03-29 LAB — GLUCOSE, CAPILLARY: Glucose-Capillary: 146 mg/dL — ABNORMAL HIGH (ref 70–99)

## 2020-03-29 LAB — BASIC METABOLIC PANEL
Anion gap: 10 (ref 5–15)
BUN: 15 mg/dL (ref 8–23)
CO2: 29 mmol/L (ref 22–32)
Calcium: 9.9 mg/dL (ref 8.9–10.3)
Chloride: 102 mmol/L (ref 98–111)
Creatinine, Ser: 1.1 mg/dL — ABNORMAL HIGH (ref 0.44–1.00)
GFR calc Af Amer: 58 mL/min — ABNORMAL LOW (ref >60)
GFR calc non Af Amer: 50 mL/min — ABNORMAL LOW (ref >60)
Glucose, Bld: 117 mg/dL — ABNORMAL HIGH (ref 70–99)
Potassium: 4 mmol/L (ref 3.5–5.1)
Sodium: 141 mmol/L (ref 135–145)

## 2020-03-29 LAB — HEMOGLOBIN A1C
Hgb A1c MFr Bld: 7 % — ABNORMAL HIGH (ref 4.8–5.6)
Mean Plasma Glucose: 154.2 mg/dL

## 2020-03-29 LAB — CBC
HCT: 36.4 % (ref 36.0–46.0)
Hemoglobin: 11.6 g/dL — ABNORMAL LOW (ref 12.0–15.0)
MCH: 31.2 pg (ref 26.0–34.0)
MCHC: 31.9 g/dL (ref 30.0–36.0)
MCV: 97.8 fL (ref 80.0–100.0)
Platelets: 318 10*3/uL (ref 150–400)
RBC: 3.72 MIL/uL — ABNORMAL LOW (ref 3.87–5.11)
RDW: 12.7 % (ref 11.5–15.5)
WBC: 6 10*3/uL (ref 4.0–10.5)
nRBC: 0 % (ref 0.0–0.2)

## 2020-03-29 LAB — NO BLOOD PRODUCTS

## 2020-03-29 LAB — SARS CORONAVIRUS 2 (TAT 6-24 HRS): SARS Coronavirus 2: NEGATIVE

## 2020-03-29 NOTE — Progress Notes (Signed)
Anesthesia PAT Evaluation:   Case: [id number] Date/Time: 04/02/20 1210   Procedures:      Awake TRACHEOSTOMY (N/A )     MICROLARYNGOSCOPY WITH CO2 LASER AND EXCISION OF VOCAL CORD LESION w/MITOMYCIN C (N/A )   Anesthesia type: Local   Pre-op diagnosis: stridor   Location: [facility] OR ROOM 08 / [facility]   Surgeons: Melida Quitter, MD      DISCUSSION: Patient is a 74 year old female scheduled for the above procedure.   Patient evaluated by Dr. Redmond Baseman on 03/22/20 for subglottic stenosis, and per his note:  - She reported a noisy sound when inspiring over the past 2 years, worse with exertion or talking.  She has conversational dyspnea.  Coughing helps a bit.  Neck CT in March demonstrated an abnormal subglottic airway.   - Transnasal fiberoptic laryngoscopy showed: Findings included normal nasal passages, no mass or abnormality in the nasopharynx, and no mass or ulceration in the pharynx or larynx. Pyriform sinuses are open. Secretions are minimal. Vocal folds are without mass, scarring, or ulceration. The vocal folds adduct and abduct symmetrically. There is good glottal closure. Muscle tension patterns are not present. Laryngeal edema is minimal. There is severe subglottic stenosis with a small opening remaining anteriorly. - Impression/Plan: Inspiratory stridor due to subglottic stenosis. "Fiberoptic exam of the larynx allowed visualization of the subglottic stenosis. The space through which she is breathing looks fairly small. She will need definitive airway management but I am concerned that she may not be able to get safely to sleep for surgery without compromising respiration. Due to severity, I recommended first securing her airway with awake tracheostomy and then proceeding with microlaryngoscopy with CO2 laser dilation and mitomycin C application. Risks, benefits, and alternatives were discussed and she expressed understanding and agreement. A five day hospitalization will be necessary. In the meantime, I  increased omeprazole to 40 mg twice daily."   Other history includes never smoker, DM2, HTN, HLD, GERD, eyelid blepharoplasty, thyroid nodules (benign biopsy 01/12/20), subglottic stenosis, exertional dyspnea colonoscopy, cholecystectomy.  BMI is consistent with obesity.  - Patient evaluated during her PAT visit. Inspiration is audible, but she was able to sit comfortably and answer questions without any acute distress. O2 sat 100%. She denied any acute changes in her breathing since she saw Dr. Redmond Baseman. She reported that for about two years people would tell her she was breathing loudly. She did not feel particularly SOB at rest, but did notice exertional dyspnea even with walking through her house. At night, she has to use Vicks Vaporub and cough to "clear" her throat but then is able to lie down and sleep using only one pillow. She denied chest pain, syncope, no persistent palpitations. She will get occasional mild pedal edema and feet can sometime feel numb. Reported home fasting CBGs 70-104. Last ASA 03/27/20.   She had several questions about what to expect going into surgery (ie, regarding awake tracheostomy) and about her hospital course (anticipated 5 day stay). Anesthesiologist Hoy Morn, MD and I answered questioned as able; however, she was under the impression that the tracheostomy tube would not be in place at the end of the case, as she remembered Dr. Redmond Baseman mentioning only having a small neck scar. (She admits that while in his office her mind wandered after hearing the term "tracheotomy" and may have missed parts of what he told her). Discussed that to our knowledge, the tracheostomy tube would still be in place after surgery, but she would need to  clarify with Dr. Redmond Baseman prior to surgery timing of decannulation (whether post-operatively or weekly later, etc).  She says that Dr. Redmond Baseman is out of the office this week, but will plan to further discuss with him on the morning of surgery.   03/29/20  presurgical COVID-19 test is in process.    VS: BP (!) 159/68   Pulse 97   Temp 37.3 C (Oral)   Resp 18   Ht 5\' 3"  (1.6 m)   Wt 85 kg   SpO2 100%   BMI 33.20 kg/m Heart RRR, no murmur note.  Mild inspiratory stridor (not new, noted in prior notes), no acute distress, no accessory muscle use. Trace pedal edema.     PROVIDERS: Rutherford Guys, MD is PCP  Noemi Chapel, DO is pulmonologist. Last visit 02/06/20.    LABS: Labs reviewed: Acceptable for surgery. (all labs ordered are listed, but only abnormal results are displayed)  Labs Reviewed  GLUCOSE, CAPILLARY - Abnormal; Notable for the following components:      Result Value   Glucose-Capillary 146 (*)    All other components within normal limits  HEMOGLOBIN A1C - Abnormal; Notable for the following components:   Hgb A1c MFr Bld 7.0 (*)    All other components within normal limits  BASIC METABOLIC PANEL - Abnormal; Notable for the following components:   Glucose, Bld 117 (*)    Creatinine, Ser 1.10 (*)    GFR calc non Af Amer 50 (*)    GFR calc Af Amer 58 (*)    All other components within normal limits  CBC - Abnormal; Notable for the following components:   RBC 3.72 (*)    Hemoglobin 11.6 (*)    All other components within normal limits    IMAGES: US Thyroid 01/09/20: IMPRESSION: 1. Mild thyromegaly with bilateral nodules. None currently meets criteria for biopsy or dedicated imaging follow-up. The above is in keeping with the ACR TI-RADS recommendations - J Am Coll Radiol 2017;14:587-595. - S/p right thyroid FNA: 01/12/20: FINAL MICROSCOPIC DIAGNOSIS:  - Consistent with benign follicular nodule (Bethesda category II)   CT Soft tissue neck 01/04/20: IMPRESSION: - Bilateral linear bands of soft tissue within the proximal subglottic airway. This is of unknown clinical significance but could reflect webs and direct visual inspection is recommended. - Multinodular thyroid without significant tracheal  compression.  CT Chest 01/04/20: IMPRESSION: 1. Visualized trachea is normal. 2. Thyroid nodules appear benign. 3. Coronary artery calcification and Aortic Atherosclerosis (ICD10-I70.0).    EKG: 03/29/20: NSR at 95 bpm    CV: Echo 12/14/19: IMPRESSIONS  1. Left ventricular ejection fraction, by estimation, is 60 to 65%. The  left ventricle has normal function. The left ventricle has no regional  wall motion abnormalities. Indeterminate diastolic filling due to E-A  fusion.  2. Right ventricular systolic function is normal. The right ventricular  size is normal. Tricuspid regurgitation signal is inadequate for assessing  PA pressure.  3. The mitral valve is normal in structure and function. Trivial mitral  valve regurgitation. No evidence of mitral stenosis.  4. The aortic valve is tricuspid. Aortic valve regurgitation is not  visualized. No aortic stenosis is present.  5. The inferior vena cava is normal in size with greater than 50%  respiratory variability, suggesting right atrial pressure of 3 mmHg.  - Conclusion(s)/Recommendation(s): Normal biventricular function without  evidence of hemodynamically significant valvular heart disease.   She reported a remote history of unremarkable stress test.    Past Medical  History:  Diagnosis Date  . Cataract   . Diabetes mellitus without complication ([place])   . Dyspnea    on exertion  . GERD (gastroesophageal reflux disease)   . Hyperlipidemia   . Hypertension   . Multiple thyroid nodules   . Thyroid disease     Past Surgical History:  Procedure Laterality Date  . COSMETIC SURGERY Bilateral    eyelids  . GALLBLADDER SURGERY    . TUBAL LIGATION      MEDICATIONS: . aspirin EC 81 MG tablet  . Blood Glucose Monitoring Suppl (TRUE METRIX METER) DEVI  . cholecalciferol (VITAMIN D) 25 MCG (1000 UNIT) tablet  . Dulaglutide (TRULICITY) 7.99 YX/2.1LU SOPN  . glucose blood (TRUE METRIX BLOOD GLUCOSE TEST) test strip  .  Insulin Glargine (BASAGLAR KWIKPEN) 100 UNIT/ML SOPN  . Insulin Pen Needle (PEN NEEDLES) 32G X 5 MM MISC  . losartan-hydrochlorothiazide (HYZAAR) 50-12.5 MG tablet  . omeprazole (PRILOSEC) 40 MG capsule  . pravastatin (PRAVACHOL) 40 MG tablet  . sitaGLIPtin-metformin (JANUMET) 50-1000 MG tablet  . Tetrahydrozoline HCl (VISINE OP)   No current facility-administered medications for this encounter.     Myra Gianotti, PA-C Surgical Short Stay/Anesthesiology [facility] Phone [phone] [facility] Phone [phone] 03/29/2020 5:45 PM

## 2020-03-29 NOTE — Pre-Procedure Instructions (Signed)
[place]  03/29/2020      [facility] - [place], [place] [address] Phone: [phone] Fax: [phone]  [facility] [address] [place]), [place] - [place] [address] [place] ([place]) [place] [address] Phone: [phone] Fax: [phone]  [facility] [place], [place] - [address] [place] [address] Phone: [phone] Fax: [phone]    Your procedure is scheduled on June 14  Report to [facility] cone Entrance A at 10:25 A.M.  Call this number if you have problems the morning of surgery:  [phone]   Remember:  Do not eat or drink after midnight.      Take these medicines the morning of surgery with A SIP OF WATER :              Omeprazole (prilosec)              Eye drops if needed           7 days prior to surgery STOP taking any Aspirin (unless otherwise instructed by your surgeon), Aleve, Naproxen, Ibuprofen, Motrin, Advil, Goody's, BC's, all herbal medications, fish oil, and all vitamins.            Follow your surgeon's instructions on when to stop Aspirin.  If no instructions were given by your surgeon then you will need to call the office to get those instructions.                     How to Manage Your Diabetes Before and After Surgery  Why is it important to control my blood sugar before and after surgery? . Improving blood sugar levels before and after surgery helps healing and can limit problems. . A way of improving blood sugar control is eating a healthy diet by: o  Eating less sugar and carbohydrates o  Increasing activity/exercise o  Talking with your doctor about reaching your blood sugar goals . High blood sugars (greater than 180 mg/dL) can raise your risk of infections and slow your recovery, so you will need to focus on controlling your diabetes during the weeks before  surgery. . Make sure that the doctor who takes care of your diabetes knows about your planned surgery including the date and location.  How do I manage my blood sugar before surgery? . Check your blood sugar at least 4 times a day, starting 2 days before surgery, to make sure that the level is not too high or low. o Check your blood sugar the morning of your surgery when you wake up and every 2 hours until you get to the Short Stay unit. . If your blood sugar is less than 70 mg/dL, you will need to treat for low blood sugar: o Do not take insulin. o Treat a low blood sugar (less than 70 mg/dL) with  cup of clear juice (cranberry or apple), 4 glucose tablets, OR glucose gel. Recheck blood sugar in 15 minutes after treatment (to make sure it is greater than 70 mg/dL). If your blood sugar is not greater than 70 mg/dL on recheck, call [phone] o  for further instructions. . Report your blood sugar to the short stay nurse when you get to Short Stay.  . If you are admitted to the hospital after surgery: o Your blood sugar will be checked by the  staff and you will probably be given insulin after surgery (instead of oral diabetes medicines) to make sure you have good blood sugar levels. o The goal for blood sugar control after surgery is 80-180 mg/dL.       WHAT DO I DO ABOUT MY DIABETES MEDICATION?   [name] Kitchen Do not take oral diabetes medicines (pills) the morning of surgery. sitagliptin-metformin (janumet)       . THE MORNING OF SURGERY, take _____10________ units of __Basaglar________insulin.  . The day of surgery, do not take other diabetes injectables, including Byetta (exenatide), Bydureon (exenatide ER), Victoza (liraglutide), or Trulicity (dulaglutide).            Do not wear jewelry, make-up or nail polish.  Do not wear lotions, powders, or perfumes, or deodorant.  Do not shave 48 hours prior to surgery.  Men may shave face and neck.  Do not bring valuables to the hospital.  [facility] is not responsible for any belongings or valuables.  Contacts, dentures or bridgework may not be worn into surgery.  Leave your suitcase in the car.  After surgery it may be brought to your room.  For patients admitted to the hospital, discharge time will be determined by your treatment team.  Patients discharged the day of surgery will not be allowed to drive home.    Special instructions:  [facility]- Preparing For Surgery  Before surgery, you can play an important role. Because skin is not sterile, your skin needs to be as free of germs as possible. You can reduce the number of germs on your skin by washing with CHG (chlorahexidine gluconate) Soap before surgery.  CHG is an antiseptic cleaner which kills germs and bonds with the skin to continue killing germs even after washing.    Oral Hygiene is also important to reduce your risk of infection.  Remember - BRUSH YOUR TEETH THE MORNING OF SURGERY WITH YOUR REGULAR TOOTHPASTE  Please do not use if you have an allergy to CHG or antibacterial soaps. If your skin becomes reddened/irritated stop using the CHG.  Do not shave (including legs and underarms) for at least 48 hours prior to first CHG shower. It is OK to shave your face.  Please follow these instructions carefully.   1. Shower the NIGHT BEFORE SURGERY and the MORNING OF SURGERY with CHG.   2. If you chose to wash your hair, wash your hair first as usual with your normal shampoo.  3. After you shampoo, rinse your hair and body thoroughly to remove the shampoo.  4. Use CHG as you would any other liquid soap. You can apply CHG directly to the skin and wash gently with a scrungie or a clean washcloth.   5. Apply the CHG Soap to your body ONLY FROM THE NECK DOWN.  Do not use on open wounds or open sores. Avoid contact with your eyes, ears, mouth and genitals (private parts). Wash Face and genitals (private parts)  with your normal soap.  6. Wash thoroughly, paying special  attention to the area where your surgery will be performed.  7. Thoroughly rinse your body with warm water from the neck down.  8. DO NOT shower/wash with your normal soap after using and rinsing off the CHG Soap.  9. Pat yourself dry with a CLEAN TOWEL.  10. Wear CLEAN PAJAMAS to bed the night before surgery, wear comfortable clothes the morning of surgery  11. Place CLEAN SHEETS on your bed the night of your first  shower and DO NOT SLEEP WITH PETS.    Day of Surgery:  Do not apply any deodorants/lotions.  Please wear clean clothes to the hospital/surgery center.   Remember to brush your teeth WITH YOUR REGULAR TOOTHPASTE.    Please read over the following fact sheets that you were given. Coughing and Deep Breathing and Surgical Site Infection Prevention

## 2020-03-29 NOTE — Progress Notes (Signed)
PCP - Welton Flakes @ [facility] Primary [facility] Cardiologist - na  PPM/ICD - na Device Orders -  Rep Notified -   Chest x-ray - 12/05/19 EKG - 03/29/20 Stress Test - >5 yrs.-normal ECHO - 12/14/19 Cardiac Cath - na  Sleep Study - na CPAP -   Fasting Blood Sugar - 70-104 Checks Blood Sugar ___1__ times a day  Blood Thinner Instructions: Aspirin Instructions: stopped 03/27/20  ERAS Protcol -na PRE-SURGERY Ensure or G2-   COVID TEST- 03/29/20   Anesthesia review: stridor,pt. Not in distress.  Patient denies shortness of breath, fever, cough and chest pain at PAT appointment   All instructions explained to the patient, with a verbal understanding of the material. Patient agrees to go over the instructions while at home for a better understanding. Patient also instructed to self quarantine after being tested for COVID-19. The opportunity to ask questions was provided.

## 2020-03-29 NOTE — Anesthesia Preprocedure Evaluation (Addendum)
Anesthesia Evaluation  Patient identified by MRN, date of birth, ID band Patient awake    Reviewed: Allergy & Precautions, NPO status , Patient's Chart, lab work & pertinent test results  History of Anesthesia Complications Negative for: history of anesthetic complications  Airway Mallampati: II  TM Distance: >3 FB Neck ROM: Full    Dental  (+) Dental Advisory Given, Teeth Intact   Pulmonary shortness of breath and with exertion,    Pulmonary exam normal        Cardiovascular hypertension, Pt. on medications Normal cardiovascular exam     Neuro/Psych negative neurological ROS  negative psych ROS   GI/Hepatic Neg liver ROS, GERD  Medicated and Controlled,  Endo/Other  diabetes, Type 2, Oral Hypoglycemic Agents, Insulin Dependent Obesity   Renal/GU negative Renal ROS     Musculoskeletal negative musculoskeletal ROS (+)   Abdominal   Peds  Hematology negative hematology ROS (+)   Anesthesia Other Findings Severe subglottic stenosis - Transnasal fiberoptic laryngoscopy showed: Findings included normal nasal passages, no mass or abnormality in the nasopharynx, and no mass or ulceration in the pharynx or larynx. Pyriform sinuses are open. Secretions are minimal. Vocal folds are without mass, scarring, or ulceration. The vocal folds adduct and abduct symmetrically. There is good glottal closure. Muscle tension patterns are not present. Laryngeal edema is minimal. There is severe subglottic stenosis with a small opening remaining anteriorly. - Impression/Plan: Inspiratory stridor due to subglottic stenosis. "Fiberoptic exam of the larynx allowed visualization of the subglottic stenosis. The space through which she is breathing looks fairly small. She will need definitive airway management but I am concerned that she may not be able to get safely to sleep for surgery without compromising respiration. Due to severity, I  recommended first securing her airway with awake tracheostomy and then proceeding with microlaryngoscopy with CO2 laser dilation and mitomycin C application. Risks, benefits, and alternatives were discussed and she expressed understanding and agreement. A five day hospitalization will be necessary. In the meantime, I increased omeprazole to 40 mg twice daily."  Covid test negative 6/10  Reproductive/Obstetrics                           Anesthesia Physical Anesthesia Plan  ASA: III  Anesthesia Plan: MAC and General   Post-op Pain Management:    Induction: Inhalational  PONV Risk Score and Plan: 3 and Propofol infusion, Treatment may vary due to age or medical condition and Midazolam  Airway Management Planned: Natural Airway, Nasal Cannula and Tracheostomy  Additional Equipment: None  Intra-op Plan:   Post-operative Plan: Extubation in OR  Informed Consent: I have reviewed the patients History and Physical, chart, labs and discussed the procedure including the risks, benefits and alternatives for the proposed anesthesia with the patient or authorized representative who has indicated his/her understanding and acceptance.     Dental advisory given  Plan Discussed with: CRNA, Anesthesiologist and Surgeon  Anesthesia Plan Comments: (Plan to begin with awake tracheostomy, minimal sedation to be provided. Once airway established, will convert to general anesthesia via tracheostomy for remainder of procedure.)     Anesthesia Quick Evaluation

## 2020-04-02 ENCOUNTER — Other Ambulatory Visit: Payer: Self-pay

## 2020-04-02 ENCOUNTER — Inpatient Hospital Stay (HOSPITAL_COMMUNITY): Payer: Medicare HMO | Admitting: Anesthesiology

## 2020-04-02 ENCOUNTER — Encounter (HOSPITAL_COMMUNITY)
Admission: RE | Disposition: A | Discharge status: Home Health | Payer: Self-pay | Source: Home / Self Care | Attending: Internal Medicine

## 2020-04-02 ENCOUNTER — Encounter (HOSPITAL_COMMUNITY): Payer: Self-pay | Admitting: Otolaryngology

## 2020-04-02 ENCOUNTER — Inpatient Hospital Stay (HOSPITAL_COMMUNITY): Payer: Medicare HMO | Admitting: Vascular Surgery

## 2020-04-02 ENCOUNTER — Inpatient Hospital Stay (HOSPITAL_COMMUNITY)
Admission: RE | Admit: 2020-04-02 | Discharge: 2020-04-10 | DRG: 013 | Disposition: A | Discharge status: Home Health | Payer: Medicare HMO | Attending: Internal Medicine | Admitting: Internal Medicine

## 2020-04-02 DIAGNOSIS — Z8249 Family history of ischemic heart disease and other diseases of the circulatory system: Secondary | ICD-10-CM

## 2020-04-02 DIAGNOSIS — E119 Type 2 diabetes mellitus without complications: Secondary | ICD-10-CM | POA: Diagnosis not present

## 2020-04-02 DIAGNOSIS — Z930 Tracheostomy status: Secondary | ICD-10-CM | POA: Diagnosis not present

## 2020-04-02 DIAGNOSIS — E785 Hyperlipidemia, unspecified: Secondary | ICD-10-CM | POA: Diagnosis present

## 2020-04-02 DIAGNOSIS — Z7982 Long term (current) use of aspirin: Secondary | ICD-10-CM

## 2020-04-02 DIAGNOSIS — Z20822 Contact with and (suspected) exposure to covid-19: Secondary | ICD-10-CM | POA: Diagnosis present

## 2020-04-02 DIAGNOSIS — M8589 Other specified disorders of bone density and structure, multiple sites: Secondary | ICD-10-CM | POA: Diagnosis present

## 2020-04-02 DIAGNOSIS — Z79899 Other long term (current) drug therapy: Secondary | ICD-10-CM

## 2020-04-02 DIAGNOSIS — E669 Obesity, unspecified: Secondary | ICD-10-CM | POA: Diagnosis present

## 2020-04-02 DIAGNOSIS — R061 Stridor: Secondary | ICD-10-CM | POA: Diagnosis not present

## 2020-04-02 DIAGNOSIS — Z7984 Long term (current) use of oral hypoglycemic drugs: Secondary | ICD-10-CM | POA: Diagnosis not present

## 2020-04-02 DIAGNOSIS — Z6835 Body mass index (BMI) 35.0-35.9, adult: Secondary | ICD-10-CM

## 2020-04-02 DIAGNOSIS — J386 Stenosis of larynx: Principal | ICD-10-CM | POA: Diagnosis present

## 2020-04-02 DIAGNOSIS — Z794 Long term (current) use of insulin: Secondary | ICD-10-CM

## 2020-04-02 DIAGNOSIS — K219 Gastro-esophageal reflux disease without esophagitis: Secondary | ICD-10-CM | POA: Diagnosis present

## 2020-04-02 DIAGNOSIS — Z833 Family history of diabetes mellitus: Secondary | ICD-10-CM | POA: Diagnosis not present

## 2020-04-02 DIAGNOSIS — I10 Essential (primary) hypertension: Secondary | ICD-10-CM | POA: Diagnosis present

## 2020-04-02 HISTORY — PX: TRACHEOSTOMY TUBE PLACEMENT: SHX814

## 2020-04-02 HISTORY — PX: MICROLARYNGOSCOPY WITH CO2 LASER AND EXCISION OF VOCAL CORD LESION: SHX5970

## 2020-04-02 LAB — GLUCOSE, CAPILLARY
Glucose-Capillary: 117 mg/dL — ABNORMAL HIGH (ref 70–99)
Glucose-Capillary: 115 mg/dL — ABNORMAL HIGH (ref 70–99)
Glucose-Capillary: 168 mg/dL — ABNORMAL HIGH (ref 70–99)
Glucose-Capillary: 143 mg/dL — ABNORMAL HIGH (ref 70–99)
Glucose-Capillary: 153 mg/dL — ABNORMAL HIGH (ref 70–99)
Glucose-Capillary: 137 mg/dL — ABNORMAL HIGH (ref 70–99)

## 2020-04-02 LAB — MRSA PCR SCREENING: MRSA by PCR: NEGATIVE

## 2020-04-02 SURGERY — CREATION, TRACHEOSTOMY
Anesthesia: Monitor Anesthesia Care | Site: Throat

## 2020-04-02 MED ORDER — PANTOPRAZOLE SODIUM 40 MG PO TBEC
80.0000 mg | DELAYED_RELEASE_TABLET | Freq: Every day | ORAL | Status: DC
  Administered 2020-04-03 – 2020-04-10 (×8): 80 mg via ORAL
  Filled 2020-04-02 (×8): qty 2

## 2020-04-02 MED ORDER — ONDANSETRON HCL 4 MG/2ML IJ SOLN: 4.0000 mg | Freq: Once | INTRAMUSCULAR | Status: DC | PRN

## 2020-04-02 MED ORDER — MORPHINE SULFATE (PF) 2 MG/ML IV SOLN
2.0000 mg | INTRAVENOUS | Status: DC | PRN
  Administered 2020-04-03: 2 mg via INTRAVENOUS
  Filled 2020-04-02: qty 1

## 2020-04-02 MED ORDER — ASPIRIN 81 MG PO CHEW
81.0000 mg | CHEWABLE_TABLET | Freq: Every day | ORAL | Status: DC
  Administered 2020-04-03 – 2020-04-10 (×8): 81 mg via ORAL
  Filled 2020-04-02 (×8): qty 1

## 2020-04-02 MED ORDER — HYDROCHLOROTHIAZIDE 12.5 MG PO CAPS
12.5000 mg | ORAL_CAPSULE | Freq: Every day | ORAL | Status: DC
  Administered 2020-04-03 – 2020-04-10 (×8): 12.5 mg via ORAL
  Filled 2020-04-02 (×9): qty 1

## 2020-04-02 MED ORDER — CHLORHEXIDINE GLUCONATE CLOTH 2 % EX PADS
6.0000 | MEDICATED_PAD | Freq: Every day | CUTANEOUS | Status: DC
  Administered 2020-04-02 – 2020-04-09 (×7): 6 via TOPICAL

## 2020-04-02 MED ORDER — LOSARTAN POTASSIUM 50 MG PO TABS
50.0000 mg | ORAL_TABLET | Freq: Every day | ORAL | Status: DC
  Administered 2020-04-03 – 2020-04-10 (×8): 50 mg via ORAL
  Filled 2020-04-02 (×10): qty 1

## 2020-04-02 MED ORDER — POTASSIUM CHLORIDE IN NACL 20-0.45 MEQ/L-% IV SOLN
INTRAVENOUS | Status: DC
  Filled 2020-04-02 (×3): qty 1000

## 2020-04-02 MED ORDER — EPINEPHRINE PF 1 MG/ML IJ SOLN
INTRAMUSCULAR | Status: DC | PRN
  Administered 2020-04-02: 1 mg

## 2020-04-02 MED ORDER — HYDRALAZINE HCL 20 MG/ML IJ SOLN
10.0000 mg | INTRAMUSCULAR | Status: DC | PRN
  Administered 2020-04-02 – 2020-04-03 (×2): 20 mg via INTRAVENOUS
  Filled 2020-04-02 (×2): qty 1

## 2020-04-02 MED ORDER — LABETALOL HCL 5 MG/ML IV SOLN
10.0000 mg | INTRAVENOUS | Status: AC | PRN
  Administered 2020-04-02 (×3): 10 mg via INTRAVENOUS

## 2020-04-02 MED ORDER — FENTANYL CITRATE (PF) 100 MCG/2ML IJ SOLN
25.0000 ug | INTRAMUSCULAR | Status: DC | PRN
  Administered 2020-04-02: 50 ug via INTRAVENOUS

## 2020-04-02 MED ORDER — ROCURONIUM BROMIDE 10 MG/ML (PF) SYRINGE
PREFILLED_SYRINGE | INTRAVENOUS | Status: DC | PRN
Start: 2020-04-02 — End: 2020-04-02
  Administered 2020-04-02: 20 mg via INTRAVENOUS

## 2020-04-02 MED ORDER — PRAVASTATIN SODIUM 40 MG PO TABS
40.0000 mg | ORAL_TABLET | Freq: Every day | ORAL | Status: DC
  Administered 2020-04-04 – 2020-04-09 (×6): 40 mg via ORAL
  Filled 2020-04-02 (×9): qty 1

## 2020-04-02 MED ORDER — ACETAMINOPHEN 10 MG/ML IV SOLN
1000.0000 mg | Freq: Once | INTRAVENOUS | Status: AC
  Administered 2020-04-02: 1000 mg via INTRAVENOUS
  Filled 2020-04-02: qty 100

## 2020-04-02 MED ORDER — ONDANSETRON HCL 4 MG/2ML IJ SOLN
4.0000 mg | Freq: Four times a day (QID) | INTRAMUSCULAR | Status: DC | PRN
  Administered 2020-04-02: 4 mg via INTRAVENOUS
  Filled 2020-04-02: qty 2

## 2020-04-02 MED ORDER — INSULIN ASPART 100 UNIT/ML ~~LOC~~ SOLN
0.0000 [IU] | Freq: Every day | SUBCUTANEOUS | Status: DC
  Administered 2020-04-04: 2 [IU] via SUBCUTANEOUS

## 2020-04-02 MED ORDER — OXYCODONE HCL 5 MG PO TABS: 5.0000 mg | ORAL_TABLET | Freq: Once | ORAL | Status: DC | PRN

## 2020-04-02 MED ORDER — ESMOLOL HCL 100 MG/10ML IV SOLN
INTRAVENOUS | Status: AC
  Filled 2020-04-02: qty 10

## 2020-04-02 MED ORDER — CEFAZOLIN SODIUM-DEXTROSE 2-3 GM-%(50ML) IV SOLR
INTRAVENOUS | Status: DC | PRN
Start: 2020-04-02 — End: 2020-04-02
  Administered 2020-04-02: 2 g via INTRAVENOUS

## 2020-04-02 MED ORDER — SODIUM CHLORIDE (PF) 0.9 % IJ SOLN
INTRAMUSCULAR | Status: AC
  Filled 2020-04-02: qty 10

## 2020-04-02 MED ORDER — INSULIN ASPART 100 UNIT/ML ~~LOC~~ SOLN
0.0000 [IU] | Freq: Three times a day (TID) | SUBCUTANEOUS | Status: DC
  Administered 2020-04-02: 2 [IU] via SUBCUTANEOUS
  Administered 2020-04-03: 3 [IU] via SUBCUTANEOUS
  Administered 2020-04-03 (×2): 2 [IU] via SUBCUTANEOUS
  Administered 2020-04-04: 3 [IU] via SUBCUTANEOUS
  Administered 2020-04-04: 5 [IU] via SUBCUTANEOUS
  Administered 2020-04-04: 2 [IU] via SUBCUTANEOUS
  Administered 2020-04-05: 5 [IU] via SUBCUTANEOUS
  Administered 2020-04-05 (×2): 3 [IU] via SUBCUTANEOUS
  Administered 2020-04-06 (×2): 5 [IU] via SUBCUTANEOUS
  Administered 2020-04-06: 3 [IU] via SUBCUTANEOUS
  Administered 2020-04-07: 8 [IU] via SUBCUTANEOUS
  Administered 2020-04-07: 3 [IU] via SUBCUTANEOUS
  Administered 2020-04-08: 2 [IU] via SUBCUTANEOUS
  Administered 2020-04-08: 5 [IU] via SUBCUTANEOUS
  Administered 2020-04-09: 3 [IU] via SUBCUTANEOUS
  Administered 2020-04-09 – 2020-04-10 (×2): 2 [IU] via SUBCUTANEOUS

## 2020-04-02 MED ORDER — ORAL CARE MOUTH RINSE: 15.0000 mL | Freq: Once | OROMUCOSAL | Status: AC

## 2020-04-02 MED ORDER — OXYCODONE HCL 5 MG/5ML PO SOLN: 5.0000 mg | Freq: Once | ORAL | Status: DC | PRN

## 2020-04-02 MED ORDER — DULAGLUTIDE 0.75 MG/0.5ML ~~LOC~~ SOAJ: 0.7500 mg | SUBCUTANEOUS | Status: DC

## 2020-04-02 MED ORDER — MIDAZOLAM HCL 2 MG/2ML IJ SOLN
INTRAMUSCULAR | Status: AC
  Filled 2020-04-02: qty 2

## 2020-04-02 MED ORDER — MITOMYCIN 0.2 MG OP KIT
PACK | OPHTHALMIC | Status: DC | PRN
  Administered 2020-04-02: 0.2 mg

## 2020-04-02 MED ORDER — LOSARTAN POTASSIUM-HCTZ 50-12.5 MG PO TABS: 1.0000 | ORAL_TABLET | Freq: Every day | ORAL | Status: DC

## 2020-04-02 MED ORDER — 0.9 % SODIUM CHLORIDE (POUR BTL) OPTIME
TOPICAL | Status: DC | PRN
  Administered 2020-04-02: 1000 mL

## 2020-04-02 MED ORDER — LACTATED RINGERS IV SOLN: INTRAVENOUS | Status: DC

## 2020-04-02 MED ORDER — FENTANYL CITRATE (PF) 250 MCG/5ML IJ SOLN
INTRAMUSCULAR | Status: AC
  Filled 2020-04-02: qty 5

## 2020-04-02 MED ORDER — CHLORHEXIDINE GLUCONATE 0.12 % MT SOLN
OROMUCOSAL | Status: AC
  Administered 2020-04-02: 15 mL via OROMUCOSAL
  Filled 2020-04-02: qty 15

## 2020-04-02 MED ORDER — EPINEPHRINE HCL (NASAL) 0.1 % NA SOLN
NASAL | Status: AC
  Filled 2020-04-02: qty 30

## 2020-04-02 MED ORDER — LABETALOL HCL 5 MG/ML IV SOLN: 0.0000 mg | INTRAVENOUS | Status: DC | PRN

## 2020-04-02 MED ORDER — FENTANYL CITRATE (PF) 100 MCG/2ML IJ SOLN
INTRAMUSCULAR | Status: AC
  Filled 2020-04-02: qty 2

## 2020-04-02 MED ORDER — MIDAZOLAM HCL 5 MG/5ML IJ SOLN
INTRAMUSCULAR | Status: DC | PRN
  Administered 2020-04-02 (×2): 1 mg via INTRAVENOUS

## 2020-04-02 MED ORDER — LIDOCAINE-EPINEPHRINE 1 %-1:100000 IJ SOLN
INTRAMUSCULAR | Status: DC | PRN
  Administered 2020-04-02: 12 mL

## 2020-04-02 MED ORDER — PROPOFOL 10 MG/ML IV BOLUS
INTRAVENOUS | Status: DC | PRN
  Administered 2020-04-02: 10 mg via INTRAVENOUS
  Administered 2020-04-02: 20 mg via INTRAVENOUS
  Administered 2020-04-02: 30 mg via INTRAVENOUS
  Administered 2020-04-02: 10 mg via INTRAVENOUS
  Administered 2020-04-02: 50 mg via INTRAVENOUS

## 2020-04-02 MED ORDER — HYDROCODONE-ACETAMINOPHEN 7.5-325 MG/15ML PO SOLN: 10.0000 mL | ORAL | Status: DC | PRN

## 2020-04-02 MED ORDER — MITOMYCIN-C INJECTION USE IN OR ONLY (0.4 MG/ML)
0.5000 mL | Freq: Once | INTRAVENOUS | Status: DC
  Filled 2020-04-02 (×2): qty 0.5

## 2020-04-02 MED ORDER — ACETAMINOPHEN 650 MG RE SUPP: 650.0000 mg | Freq: Four times a day (QID) | RECTAL | Status: DC | PRN

## 2020-04-02 MED ORDER — CHLORHEXIDINE GLUCONATE 0.12 % MT SOLN: 15.0000 mL | Freq: Once | OROMUCOSAL | Status: AC

## 2020-04-02 MED ORDER — CHLORHEXIDINE GLUCONATE 0.12 % MT SOLN
15.0000 mL | Freq: Two times a day (BID) | OROMUCOSAL | Status: DC
  Administered 2020-04-03 – 2020-04-10 (×16): 15 mL via OROMUCOSAL
  Filled 2020-04-02 (×15): qty 15

## 2020-04-02 MED ORDER — ORAL CARE MOUTH RINSE
15.0000 mL | Freq: Two times a day (BID) | OROMUCOSAL | Status: DC
  Administered 2020-04-03 – 2020-04-09 (×10): 15 mL via OROMUCOSAL

## 2020-04-02 MED ORDER — LABETALOL HCL 5 MG/ML IV SOLN
INTRAVENOUS | Status: AC
  Filled 2020-04-02: qty 4

## 2020-04-02 MED ORDER — LABETALOL HCL 5 MG/ML IV SOLN: 10.0000 mg | Freq: Once | INTRAVENOUS | Status: DC

## 2020-04-02 MED ORDER — PROPOFOL 10 MG/ML IV BOLUS
INTRAVENOUS | Status: AC
  Filled 2020-04-02: qty 20

## 2020-04-02 MED ORDER — ONDANSETRON HCL 4 MG/2ML IJ SOLN
INTRAMUSCULAR | Status: DC | PRN
  Administered 2020-04-02: 4 mg via INTRAVENOUS

## 2020-04-02 MED ORDER — FENTANYL CITRATE (PF) 100 MCG/2ML IJ SOLN
INTRAMUSCULAR | Status: DC | PRN
  Administered 2020-04-02 (×6): 25 ug via INTRAVENOUS

## 2020-04-02 MED ORDER — CEFAZOLIN SODIUM-DEXTROSE 1-4 GM/50ML-% IV SOLN
1.0000 g | Freq: Three times a day (TID) | INTRAVENOUS | Status: AC
  Administered 2020-04-02 – 2020-04-03 (×3): 1 g via INTRAVENOUS
  Filled 2020-04-02 (×5): qty 50

## 2020-04-02 MED ORDER — CEFAZOLIN SODIUM 1 G IJ SOLR
INTRAMUSCULAR | Status: AC
  Filled 2020-04-02: qty 20

## 2020-04-02 MED ORDER — ESMOLOL HCL 100 MG/10ML IV SOLN
INTRAVENOUS | Status: DC | PRN
Start: 2020-04-02 — End: 2020-04-02
  Administered 2020-04-02 (×2): 10 mg via INTRAVENOUS

## 2020-04-02 MED ORDER — LIDOCAINE-EPINEPHRINE 1 %-1:100000 IJ SOLN
INTRAMUSCULAR | Status: AC
  Filled 2020-04-02: qty 1

## 2020-04-02 SURGICAL SUPPLY — 60 items
BALLN PULM 12 13.5 15X75 (BALLOONS)
BALLN PULM 15 16.5 18X75 (BALLOONS) ×2
BALLN PULMONARY 10-12 (MISCELLANEOUS) IMPLANT
BALLOON PULM 12 13.5 15X75 (BALLOONS) IMPLANT
BALLOON PULM 15 16.5 18X75 (BALLOONS) IMPLANT
BLADE CLIPPER SURG (BLADE) IMPLANT
BLADE SURG 15 STRL LF DISP TIS (BLADE) IMPLANT
BLADE SURG 15 STRL SS (BLADE)
BNDG EYE OVAL (GAUZE/BANDAGES/DRESSINGS) ×4 IMPLANT
CANISTER SUCT 3000ML PPV (MISCELLANEOUS) ×2 IMPLANT
CLEANER TIP ELECTROSURG 2X2 (MISCELLANEOUS) ×2 IMPLANT
CNTNR URN SCR LID CUP LEK RST (MISCELLANEOUS) IMPLANT
CONT SPEC 4OZ STRL OR WHT (MISCELLANEOUS)
CORD BIPOLAR FORCEPS 12FT (ELECTRODE) ×1 IMPLANT
COVER BACK TABLE 60X90IN (DRAPES) ×2 IMPLANT
COVER MAYO STAND STRL (DRAPES) ×2 IMPLANT
COVER SURGICAL LIGHT HANDLE (MISCELLANEOUS) ×2 IMPLANT
COVER WAND RF STERILE (DRAPES) ×2 IMPLANT
DECANTER SPIKE VIAL GLASS SM (MISCELLANEOUS) ×2 IMPLANT
DRAPE HALF SHEET 40X57 (DRAPES) ×2 IMPLANT
ELECT COATED BLADE 2.86 ST (ELECTRODE) ×2 IMPLANT
ELECT REM PT RETURN 9FT ADLT (ELECTROSURGICAL) ×2
ELECTRODE REM PT RTRN 9FT ADLT (ELECTROSURGICAL) ×1 IMPLANT
FORCEPS BIPOLAR SPETZLER 8 1.0 (NEUROSURGERY SUPPLIES) ×1 IMPLANT
GAUZE 4X4 16PLY RFD (DISPOSABLE) ×2 IMPLANT
GAUZE PETROLATUM 1 X8 (GAUZE/BANDAGES/DRESSINGS) IMPLANT
GAUZE SPONGE 4X4 12PLY STRL (GAUZE/BANDAGES/DRESSINGS) ×2 IMPLANT
GLOVE BIO SURGEON STRL SZ7.5 (GLOVE) ×2 IMPLANT
GOWN STRL REUS W/ TWL LRG LVL3 (GOWN DISPOSABLE) ×2 IMPLANT
GOWN STRL REUS W/TWL LRG LVL3 (GOWN DISPOSABLE) ×4
HOLDER TRACH TUBE VELCRO 19.5 (MISCELLANEOUS) ×2 IMPLANT
KIT BASIN OR (CUSTOM PROCEDURE TRAY) ×2 IMPLANT
KIT SUCTION CATH 14FR (SUCTIONS) IMPLANT
KIT TURNOVER KIT B (KITS) ×2 IMPLANT
NDL HYPO 25GX1X1/2 BEV (NEEDLE) IMPLANT
NEEDLE HYPO 25GX1X1/2 BEV (NEEDLE) IMPLANT
NS IRRIG 1000ML POUR BTL (IV SOLUTION) ×2 IMPLANT
PAD ARMBOARD 7.5X6 YLW CONV (MISCELLANEOUS) ×4 IMPLANT
PATTIES SURGICAL .5 X1 (DISPOSABLE) ×2 IMPLANT
PATTIES SURGICAL .5 X3 (DISPOSABLE) ×2 IMPLANT
POSITIONER HEAD DONUT 9IN (MISCELLANEOUS) ×2 IMPLANT
SOL ANTI FOG 6CC (MISCELLANEOUS) ×1 IMPLANT
SOLUTION ANTI FOG 6CC (MISCELLANEOUS) ×1
SPLINT NASAL DOYLE BI-VL (GAUZE/BANDAGES/DRESSINGS) ×1 IMPLANT
SPONGE DRAIN TRACH 4X4 STRL 2S (GAUZE/BANDAGES/DRESSINGS) ×2 IMPLANT
SPONGE INTESTINAL PEANUT (DISPOSABLE) IMPLANT
SURGILUBE 2OZ TUBE FLIPTOP (MISCELLANEOUS) IMPLANT
SUT SILK 0 FSL (SUTURE) ×2 IMPLANT
SUT SILK 2 0 PERMA HAND 18 BK (SUTURE) IMPLANT
SUT SILK 2 0 REEL (SUTURE) ×2 IMPLANT
SUT SILK 2 0 SH CR/8 (SUTURE) ×2 IMPLANT
SUT VIC AB 2-0 FS1 27 (SUTURE) IMPLANT
SYR 20ML ECCENTRIC (SYRINGE) ×2 IMPLANT
SYR BULB EAR ULCER 3OZ GRN STR (SYRINGE) ×2 IMPLANT
SYR CONTROL 10ML LL (SYRINGE) ×2 IMPLANT
TOWEL GREEN STERILE FF (TOWEL DISPOSABLE) ×2 IMPLANT
TRAY ENT MC OR (CUSTOM PROCEDURE TRAY) ×2 IMPLANT
TUBE CONNECTING 12X1/4 (SUCTIONS) ×2 IMPLANT
TUBE TRACH SHILEY  6 DIST  CUF (TUBING) ×2 IMPLANT
WATER STERILE IRR 1000ML POUR (IV SOLUTION) ×2 IMPLANT

## 2020-04-02 NOTE — Transfer of Care (Signed)
Immediate Anesthesia Transfer of Care Note  Patient: Bridget Butler  Procedure(s) Performed: Awake TRACHEOSTOMY (N/A Throat) MICROLARYNGOSCOPY WITH DILATION AND CO2 LASER AND EXCISION OF VOCAL CORD LESION w/MITOMYCIN C (N/A Throat)  Patient Location: PACU  Anesthesia Type:General  Level of Consciousness: awake and alert   Airway & Oxygen Therapy: Patient Spontanous Breathing and Patient connected to tracheostomy mask oxygen  Post-op Assessment: Report given to RN, Post -op Vital signs reviewed and stable and Patient moving all extremities  Post vital signs: Reviewed and stable  Last Vitals:  Vitals Value Taken Time  BP 137/62 04/02/20 1355  Temp    Pulse 102 04/02/20 1358  Resp 18 04/02/20 1358  SpO2 100 % 04/02/20 1358  Vitals shown include unvalidated device data.  Last Pain:  Vitals:   04/02/20 1013  TempSrc:   PainSc: 0-No pain         Complications: No complications documented.

## 2020-04-02 NOTE — Anesthesia Procedure Notes (Addendum)
Procedure Name: MAC Date/Time: 04/02/2020 12:24 PM Performed by: Alain Marion, CRNA Pre-anesthesia Checklist: Patient identified, Emergency Drugs available, Suction available and Patient being monitored Oxygen Delivery Method: Nasal cannula Placement Confirmation: positive ETCO2

## 2020-04-02 NOTE — Brief Op Note (Signed)
04/02/2020  1:46 PM  PATIENT:  Bridget Butler  74 y.o. female  PRE-OPERATIVE DIAGNOSIS:  stridor  POST-OPERATIVE DIAGNOSIS:  stridor  PROCEDURE:  Procedure(s): Awake TRACHEOSTOMY (N/A) MICROLARYNGOSCOPY WITH DILATION AND CO2 LASER AND EXCISION OF VOCAL CORD LESION w/MITOMYCIN C (N/A)  SURGEON:  Surgeon(s) and Role:    Melida Quitter, MD - Primary  PHYSICIAN ASSISTANT:   ASSISTANTS: None   ANESTHESIA:   local and general  EBL:  10 mL   BLOOD ADMINISTERED:none  DRAINS: none   LOCAL MEDICATIONS USED:  LIDOCAINE   SPECIMEN:  No Specimen  DISPOSITION OF SPECIMEN:  N/A  COUNTS:  YES  TOURNIQUET:  * No tourniquets in log *  DICTATION: .Note written in EPIC  PLAN OF CARE: Admit to inpatient   PATIENT DISPOSITION:  PACU - hemodynamically stable.   Delay start of Pharmacological VTE agent (>24hrs) due to surgical blood loss or risk of bleeding: no

## 2020-04-02 NOTE — Anesthesia Postprocedure Evaluation (Signed)
Anesthesia Post Note  Patient: Bridget Butler  Procedure(s) Performed: Awake TRACHEOSTOMY (N/A Throat) MICROLARYNGOSCOPY WITH DILATION AND CO2 LASER AND EXCISION OF VOCAL CORD LESION w/MITOMYCIN C (N/A Throat)     Patient location during evaluation: PACU Anesthesia Type: MAC and General Level of consciousness: awake and alert Pain management: pain level controlled Vital Signs Assessment: post-procedure vital signs reviewed and stable Respiratory status: spontaneous breathing, nonlabored ventilation, respiratory function stable and patient connected to tracheostomy mask oxygen Cardiovascular status: stable and blood pressure returned to baseline Postop Assessment: no apparent nausea or vomiting Anesthetic complications: no   No complications documented.  Last Vitals:  Vitals:   04/02/20 1607 04/02/20 1615  BP:    Pulse:  93  Resp:  17  Temp:  36.7 C  SpO2: 100% 98%    Last Pain:  Vitals:   04/02/20 1615  TempSrc: Oral  PainSc:                  [place]

## 2020-04-02 NOTE — Op Note (Signed)
Preop diagnosis: Subglottic stenosis, inspiratory stridor Postop diagnosis: same Procedure: Awake tracheostomy, suspended microlaryngoscopy with CO2 laser dilation and application of mitomycin C Surgeon: Redmond Baseman Anesth: Local with 1% lidocaine with 1:100,000 epinephrine and General Compl: None Findings: 90% circumferential stenosis of subglottis Description:  After discussing risks, benefits, and alternatives, the patient was brought to the operative suite and placed on the operative table in the supine position.  The patient was given light sedation and the neck was prepped and draped in sterile fashion.  The trach site was marked with a marking pen and injected with local anesthetic.  The vertical incision was made with a 15 blade scalpel and subcutaneous fat was removed.  The midline raphe was divided and strap muscles retracted to either side.  The thyroid isthmus was divided with electrocautery.  Soft tissues were swept from the anterior tracheal wall.  A horizontal incision was maded between rings 2 and 3 and extended with scissors.  A stay suture was placed around the ring above the trach site using 2-0 silk.  A #6 cuffed Shiley was then placed without difficulty and general anesthesia was induced and the patient successfully ventilated.  The stay suture was tied with two knots and the trach flange was secured to the skin in four quadrants using 2-0 suture.  The eyes were taped closed and the bed was turned 90 degrees from anesthesia.  A tooth guard was placed and the larynx was exposed using a Storz laryngoscope.  The laryngoscope was placed in suspension on the Mayo stand using a Lewy arm.  Damp eye pads were taped over the eyes and a damp towel placed over the face.  A zero degree telescope was used to make a preoperative photograph.  The operating microscope was brought into the field.  An epinephrine pledget was placed against the stenotic tissue for a few minutes and then removed.  The CO2 laser  was used on a setting of 8 watts to make radial incisions at 6:00, 10:30, and 1:30.  The large tracheal dilation balloon was then placed and inflated to 7 atm for 60 seconds twice.  A section of pledget sutured around a section of suction tubing was then soaked with mitomycin C and this was then placed into the stenotic area for 3 minutes.  After removal, a postoperative photograph was made with the zero degree telescope.  The laryngoscope was then taken out of suspension and removed from the patient's mouth while suctioning the airway.  She was then turned back to anesthesia for wake-up and was moved to the recovery room in stable condition after a trach tie and dressing were placed.

## 2020-04-02 NOTE — Progress Notes (Signed)
   Subjective:    Patient ID: Bridget Butler, female    DOB: 1945/12/29, 74 y.o.   MRN: [id number]  HPI Feeling good.  Tolerating trach well.  Review of Systems     Objective:   Physical Exam AF VSS except BP 194/90 Alert, NAD #6 cuffed Shiley in place with cuff inflated, bloody secretions    Assessment & Plan:  Subglottic stenosis, inspiratory stridor s/p tracheostomy, subglottic dilation  Doing well from tracheostomy standpoint.  Plan to decrease cuff tomorrow and consult SLP for PM valve and swallow evaluation.  Appreciate CCM consultation for hypertension and other medical management.

## 2020-04-02 NOTE — H&P (Signed)
Bridget Butler is an 74 y.o. female.   Chief Complaint: Subglottic stenosis HPI: 74 year old female with noisy breathing for the past two years that is worse with exertion or talking.  A neck CT scan in March demonstrated an abnormal subglottic airway.  Fiberoptic laryngoscopy in the office demonstrated a narrow subglottic airway.  She presents for surgical management.  Past Medical History:  Diagnosis Date  . Cataract   . Diabetes mellitus without complication ([place])   . Dyspnea    on exertion  . GERD (gastroesophageal reflux disease)   . Hyperlipidemia   . Hypertension   . Multiple thyroid nodules   . Thyroid disease     Past Surgical History:  Procedure Laterality Date  . COSMETIC SURGERY Bilateral    eyelids  . GALLBLADDER SURGERY    . TUBAL LIGATION      Family History  Problem Relation Age of Onset  . Heart disease Mother   . Hypertension Mother   . Cancer Sister   . Diabetes Sister    Social History:  reports that she has never smoked. She has never used smokeless tobacco. She reports that she does not drink alcohol and does not use drugs.  Allergies: No Known Allergies  Medications Prior to Admission  Medication Sig Dispense Refill  . aspirin EC 81 MG tablet Take 81 mg by mouth daily.    . cholecalciferol (VITAMIN D) 25 MCG (1000 UNIT) tablet Take 1,000 Units by mouth at bedtime.    . Dulaglutide (TRULICITY) 4.31 VQ/0.0QQ SOPN Inject 0.5 mLs into the skin once a week. (Patient taking differently: Inject 0.5 mLs into the skin once a week. Sunday) 4 pen 5  . Insulin Glargine (BASAGLAR KWIKPEN) 100 UNIT/ML SOPN Inject 0.2 mLs (20 Units total) into the skin at bedtime. (Patient taking differently: Inject 20 Units into the skin daily. ) 45 mL 3  . losartan-hydrochlorothiazide (HYZAAR) 50-12.5 MG tablet Take 1 tablet by mouth daily. 90 tablet 3  . omeprazole (PRILOSEC) 40 MG capsule Take 40 mg by mouth in the morning and at bedtime.     . pravastatin (PRAVACHOL) 40 MG  tablet Take 1 tablet (40 mg total) by mouth daily. (Patient taking differently: Take 40 mg by mouth at bedtime. ) 90 tablet 3  . sitaGLIPtin-metformin (JANUMET) 50-1000 MG tablet Take 1 tablet by mouth daily. (Patient taking differently: Take 1 tablet by mouth at bedtime. ) 90 tablet 1  . Tetrahydrozoline HCl (VISINE OP) Apply 1 drop to eye daily as needed (dry eyes).    . Blood Glucose Monitoring Suppl (TRUE METRIX METER) DEVI 1 Device by Does not apply route 2 (two) times daily. 1 Device 0  . glucose blood (TRUE METRIX BLOOD GLUCOSE TEST) test strip Use as instructed 300 each 1  . Insulin Pen Needle (PEN NEEDLES) 32G X 5 MM MISC 1 Units by Does not apply route daily. 100 each 5    No results found for this or any previous visit (from the past 48 hour(s)). No results found.  Review of Systems  All other systems reviewed and are negative.   Blood pressure (!) 178/76, pulse 99, temperature (!) 97.1 F (36.2 C), temperature source Tympanic, resp. rate 18, height 5\' 3"  (1.6 m), weight 84.6 kg, SpO2 100 %. Physical Exam  Constitutional: No distress.  HENT:  Head: Normocephalic and atraumatic.  Right Ear: External ear normal.  Left Ear: External ear normal.  Nose: Nose normal.  Mouth/Throat: Mucous membranes are moist. Oropharynx  is clear.  Eyes: Pupils are equal, round, and reactive to light. Conjunctivae are normal.  Cardiovascular: Normal rate.  Respiratory: Effort normal.  GI: Normal appearance.  Musculoskeletal:     Cervical back: Normal range of motion.  Neurological: She is alert.  Skin: Skin is warm and dry.  Psychiatric: Her behavior is normal. Mood, judgment and thought content normal.     Assessment/Plan Subglottic stenosis and inspiratory stridor  To OR for awake tracheostomy (due to narrowness of airway and concern for loss of airway control) and microlaryngoscopy with CO2 laser dilation with mitomycin C application.  Melida Quitter, MD 04/02/2020, 12:06 PM

## 2020-04-02 NOTE — Anesthesia Procedure Notes (Signed)
Procedure Name: Awake intubation Date/Time: 04/02/2020 1:01 PM Performed by: Alain Marion, CRNA Pre-anesthesia Checklist: Patient identified, Emergency Drugs available, Suction available and Patient being monitored Oxygen Delivery Method: Circle system utilized Induction Type: Inhalational induction and Tracheostomy Placement Confirmation: positive ETCO2 Comments: 6.0 shiley cuffed trach placed by Dr. Redmond Baseman, obturator at bedside.

## 2020-04-02 NOTE — Consult Note (Addendum)
NAME:  Bridget Butler, MRN:  [id number], DOB:  1946-06-26, LOS: 0 ADMISSION DATE:  04/02/2020, CONSULTATION DATE:  04/02/20 REFERRING MD:  Redmond Baseman  CHIEF COMPLAINT:  Medical Management   Brief History   Bridget Butler is a 74 y.o. female who was admitted 6/14 for elective trach for subglottic stenosis.  PCCM asked to see for assistance with medical management.  History of present illness   [name] is a 74 y.o. female who has a PMH as below.  She presented to [facility] 6/14 for elective trach and microlaryngoscopy with dilation and CO2 laser and excision of focal cord lesion 2/2 subglottic stenosis and stridor (performed by Dr. Redmond Baseman of ENT).  Post op, PCCM asked to see for assistance with medical management.  SBP on arrival to ICU in low 200's.  Pt stated she did not take her hyzaar earlier this morning.  Pain adequately controlled.  Past Medical History  has Insulin dependent type 2 diabetes mellitus, uncontrolled ([place]); Essential hypertension; Hyperlipidemia LDL goal <100; Gastroesophageal reflux disease; Class 2 severe obesity due to excess calories with serious comorbidity and body mass index (BMI) of 35.0 to 35.9 in adult [facility]); Osteopenia of multiple sites; Abnormal thyroid stimulating hormone (TSH) level; and Subglottic stenosis on their problem list.  Significant Hospital Events   6/14 > admit.  Consults:  PCCM.  Procedures:  Trach 6/14 >   Significant Diagnostic Tests:  None.  Micro Data:  None.  Antimicrobials:  None.   Interim history/subjective:  Comfortable.  SBP 200.  She states she did not take her hyzaar this AM.  Objective:  Blood pressure (!) 189/85, pulse 93, temperature 98.1 F (36.7 C), temperature source Oral, resp. rate 17, height 5\' 3"  (1.6 m), weight 89.9 kg, SpO2 98 %.    FiO2 (%):  [28 %-40 %] 28 %   Intake/Output Summary (Last 24 hours) at 04/02/2020 1650 Last data filed at 04/02/2020 1342 Gross per 24 hour  Intake 1050 ml  Output 10 ml  Net 1040 ml     Filed Weights   04/02/20 1003 04/02/20 1615  Weight: 84.6 kg 89.9 kg    Examination: General: Adult female, in NAD. Neuro: A&O x 3, no deficits. HEENT: [place]/AT. Sclerae anicteric.  Trach C/D/I. Cardiovascular: RRR, no M/R/G.  Lungs: Respirations even and unlabored.  CTA bilaterally, No W/R/R.  Abdomen: BS x 4, soft, NT/ND.  Musculoskeletal: No gross deformities, no edema.  Skin: Intact, warm, no rashes.  Assessment & Plan:   Subglottic stenosis - s/p trach 6/14 (Dr. Redmond Baseman). - Post op care and trach management per ENT. - SLP eval in AM.  Hx HTN, HLD. - Labetalol and Hydralazine PRN. - Once able to take PO, continue home hyzaar (HCTZ + losartan while in hospital), pravastatin.  Hx DM. - SSI. - Hold home dulaglutide, glargine, janumet.  Best Practice:  Diet: NPO.  SLP Eval in AM. Pain/Anxiety/Delirium protocol (if indicated): Morphine PRN. VAP protocol (if indicated): N/A. DVT prophylaxis: SCD's. GI prophylaxis: PPI. Glucose control: SSI. Mobility: Bedrest. Code Status: Full. Family Communication: Per family. Disposition: ICU.  Labs   CBC: Recent Labs  Lab 03/29/20 1400  WBC 6.0  HGB 11.6*  HCT 36.4  MCV 97.8  PLT 034   Basic Metabolic Panel: Recent Labs  Lab 03/29/20 1400  NA 141  K 4.0  CL 102  CO2 29  GLUCOSE 117*  BUN 15  CREATININE 1.10*  CALCIUM 9.9   GFR: Estimated Creatinine Clearance: 48.5 mL/min (A) (  by C-G formula based on SCr of 1.1 mg/dL (H)). Recent Labs  Lab 03/29/20 1400  WBC 6.0   Liver Function Tests: No results for input(s): AST, ALT, ALKPHOS, BILITOT, PROT, ALBUMIN in the last 168 hours. No results for input(s): LIPASE, AMYLASE in the last 168 hours. No results for input(s): AMMONIA in the last 168 hours. ABG No results found for: PHART, PCO2ART, PO2ART, HCO3, TCO2, ACIDBASEDEF, O2SAT  Coagulation Profile: No results for input(s): INR, PROTIME in the last 168 hours. Cardiac Enzymes: No results for input(s): CKTOTAL,  CKMB, CKMBINDEX, TROPONINI in the last 168 hours. HbA1C: Hgb A1c MFr Bld  Date/Time Value Ref Range Status  03/29/2020 02:00 PM 7.0 (H) 4.8 - 5.6 % Final    Comment:    (NOTE) Pre diabetes:          5.7%-6.4%  Diabetes:              >6.4%  Glycemic control for   <7.0% adults with diabetes   12/05/2019 03:08 PM 7.1 (H) 4.8 - 5.6 % Final    Comment:             Prediabetes: 5.7 - 6.4          Diabetes: >6.4          Glycemic control for adults with diabetes: <7.0    CBG: Recent Labs  Lab 03/29/20 1310 04/02/20 1006 04/02/20 1215 04/02/20 1407  GLUCAP 146* 117* 115* 168*    Review of Systems:   All negative; except for those that are bolded, which indicate positives.  Constitutional: weight loss, weight gain, night sweats, fevers, chills, fatigue, weakness.  HEENT: headaches, sore throat, sneezing, nasal congestion, post nasal drip, difficulty swallowing, tooth/dental problems, visual complaints, visual changes, ear aches. Neuro: difficulty with speech, weakness, numbness, ataxia. CV:  chest pain, orthopnea, PND, swelling in lower extremities, dizziness, palpitations, syncope.  Resp: cough, hemoptysis, dyspnea, wheezing. GI: heartburn, indigestion, abdominal pain, nausea, vomiting, diarrhea, constipation, change in bowel habits, loss of appetite, hematemesis, melena, hematochezia.  GU: dysuria, change in color of urine, urgency or frequency, flank pain, hematuria. MSK: joint pain or swelling, decreased range of motion. Psych: change in mood or affect, depression, anxiety, suicidal ideations, homicidal ideations. Skin: rash, itching, bruising.   Past medical history  She,  has a past medical history of Cataract, Diabetes mellitus without complication ([place]), Dyspnea, GERD (gastroesophageal reflux disease), Hyperlipidemia, Hypertension, Multiple thyroid nodules, and Thyroid disease.   Surgical History    Past Surgical History:  Procedure Laterality Date  . COSMETIC  SURGERY Bilateral    eyelids  . GALLBLADDER SURGERY    . TUBAL LIGATION       Social History   reports that she has never smoked. She has never used smokeless tobacco. She reports that she does not drink alcohol and does not use drugs.   Family history   Her family history includes Cancer in her sister; Diabetes in her sister; Heart disease in her mother; Hypertension in her mother.   Allergies No Known Allergies   Home meds  Prior to Admission medications   Medication Sig Start Date End Date Taking? Authorizing Provider  aspirin EC 81 MG tablet Take 81 mg by mouth daily.   Yes [provider]  cholecalciferol (VITAMIN D) 25 MCG (1000 UNIT) tablet Take 1,000 Units by mouth at bedtime.   Yes [provider]  Dulaglutide (TRULICITY) 8.93 YB/0.1BP SOPN Inject 0.5 mLs into the skin once a week. Patient taking differently:  Inject 0.5 mLs into the skin once a week. Sunday 09/29/19  Yes Rutherford Guys, MD  Insulin Glargine (BASAGLAR KWIKPEN) 100 UNIT/ML SOPN Inject 0.2 mLs (20 Units total) into the skin at bedtime. Patient taking differently: Inject 20 Units into the skin daily.  11/16/19  Yes Rutherford Guys, MD  losartan-hydrochlorothiazide (HYZAAR) 50-12.5 MG tablet Take 1 tablet by mouth daily. 12/05/19  Yes Rutherford Guys, MD  omeprazole (PRILOSEC) 40 MG capsule Take 40 mg by mouth in the morning and at bedtime.    Yes [provider]  pravastatin (PRAVACHOL) 40 MG tablet Take 1 tablet (40 mg total) by mouth daily. Patient taking differently: Take 40 mg by mouth at bedtime.  06/24/19  Yes Rutherford Guys, MD  sitaGLIPtin-metformin (JANUMET) 50-1000 MG tablet Take 1 tablet by mouth daily. Patient taking differently: Take 1 tablet by mouth at bedtime.  07/07/19  Yes Rutherford Guys, MD  Tetrahydrozoline HCl (VISINE OP) Apply 1 drop to eye daily as needed (dry eyes).   Yes [provider]  Blood Glucose Monitoring Suppl (TRUE METRIX METER) DEVI 1 Device  by Does not apply route 2 (two) times daily. 07/12/18   Tenna Delaine D, PA-C  glucose blood (TRUE METRIX BLOOD GLUCOSE TEST) test strip Use as instructed 01/05/20   Rutherford Guys, MD  Insulin Pen Needle (PEN NEEDLES) 32G X 5 MM MISC 1 Units by Does not apply route daily. 07/08/18   Shawnee Knapp, MD     Montey Hora, [facility] 04/02/2020, 4:50 PM

## 2020-04-03 ENCOUNTER — Encounter (HOSPITAL_COMMUNITY): Payer: Self-pay | Admitting: Otolaryngology

## 2020-04-03 DIAGNOSIS — Z930 Tracheostomy status: Secondary | ICD-10-CM

## 2020-04-03 LAB — GLUCOSE, CAPILLARY
Glucose-Capillary: 128 mg/dL — ABNORMAL HIGH (ref 70–99)
Glucose-Capillary: 141 mg/dL — ABNORMAL HIGH (ref 70–99)
Glucose-Capillary: 152 mg/dL — ABNORMAL HIGH (ref 70–99)
Glucose-Capillary: 147 mg/dL — ABNORMAL HIGH (ref 70–99)

## 2020-04-03 NOTE — Evaluation (Addendum)
Passy-Muir Speaking Valve - Evaluation Patient Details  Name: Bridget Butler MRN: [id number] Date of Birth: 06-Mar-1946  Today's Date: 04/03/2020 Time: 0950-0955 SLP Time Calculation (min) (ACUTE ONLY): 5 min  Past Medical History:  Past Medical History:  Diagnosis Date  . Cataract   . Diabetes mellitus without complication ([place])   . Dyspnea    on exertion  . GERD (gastroesophageal reflux disease)   . Hyperlipidemia   . Hypertension   . Multiple thyroid nodules   . Thyroid disease    Past Surgical History:  Past Surgical History:  Procedure Laterality Date  . COSMETIC SURGERY Bilateral    eyelids  . GALLBLADDER SURGERY    . MICROLARYNGOSCOPY WITH CO2 LASER AND EXCISION OF VOCAL CORD LESION N/A 04/02/2020   Procedure: MICROLARYNGOSCOPY WITH DILATION AND CO2 LASER AND EXCISION OF VOCAL CORD LESION w/MITOMYCIN C;  Surgeon: Melida Quitter, MD;  Location: [facility];  Service: ENT;  Laterality: N/A;  . TRACHEOSTOMY TUBE PLACEMENT N/A 04/02/2020   Procedure: Awake TRACHEOSTOMY;  Surgeon: Melida Quitter, MD;  Location: [facility] OR;  Service: ENT;  Laterality: N/A;  . TUBAL LIGATION     HPI:  Pt is a 74 yo F admitted for elective trach placement 2/2 subglottic stenosis.  Pt had been experiencing inhalatory stridor which worsesned with talking or exertion. A neck CT scan in March demonstrated an abnormal subglottic airway. Shiley 6 cuffed trach placed by Dr. Redmond Baseman on 04/02/2020   Assessment / Plan / Recommendation Clinical Impression  Pt presents with aphonia 2/2 tracheostomy which was electively placed for subglottic stenosis. Pt has Shiley 6 cuffed trach with cuff deflated on SLP arrival.  Pt was unable to tolerate placement of PMV today. Placement of valve consistently produced coughing, but pt was unable to expectorate secretions.  Small amount of secretions expectorated at trach site after removal of valve with initial trial. Trials were limited to a few breaths, during which vitals remaine constant,  except for brief HR increase to 124 with coughing; however, pt exhibited and endorsed discomfort and breath stacking was noted with backpressure on valve removal.  Given narrowing of subglottic airway, pt may not be able to tolerated PMV.  There may also be post surgical edema contributing to difficulty passing air through upper airway.  SLP will continue with PMV trials as appropriate.  PMV left in room in drawer to be used with SLP ONLY SLP Visit Diagnosis: Aphonia (R49.1)    SLP Assessment  Patient needs continued Speech Lanaguage Pathology Services    Follow Up Recommendations   Continue ST services at next level of care    Frequency and Duration min 2x/week  2 weeks    PMSV Trial PMSV was placed for: 5 breaths; <15 seconds Able to redirect subglottic air through upper airway: Yes (partial) Able to Attain Phonation:  (minimal) Voice Quality: Hoarse;Breathy (high pitched; stridorous) Able to Expectorate Secretions: No Breath Support for Phonation: Severely decreased Respirations During Trial: 20 SpO2 During Trial: 99 % Pulse During Trial: 109 Behavior: Alert;Controlled;Cooperative;Good eye contact   Tracheostomy Tube  Additional Tracheostomy Tube Assessment Secretion Description: pink    Vent Dependency  FiO2 (%): 28 %    Cuff Deflation Trial  GO Tolerated Cuff Deflation:  (cuff deflated on SLP arrival) Behavior: Alert;Good eye contact;Controlled;[place], [place], [place] Office: [phone]  04/03/2020, 10:23 AM

## 2020-04-03 NOTE — Progress Notes (Signed)
Report given to Leatrice Jewels, receiving RN 6N.

## 2020-04-03 NOTE — Progress Notes (Signed)
   04/03/20 2205  Assess: MEWS Score  Temp 99.5 F (37.5 C)  BP (!) 141/99  Pulse Rate (!) 119  Resp 18  Level of Consciousness Alert  SpO2 100 %  O2 Device Tracheostomy Collar  O2 Flow Rate (L/min) 5 L/min  FiO2 (%) 28 %  Assess: MEWS Score  MEWS Temp 0  MEWS Systolic 0  MEWS Pulse 2  MEWS RR 0  MEWS LOC 0  MEWS Score 2  MEWS Score Color Yellow  Assess: if the MEWS score is Yellow or Red  Were vital signs taken at a resting state? Yes  Focused Assessment Documented focused assessment  Early Detection of Sepsis Score *See Row Information* Low  MEWS guidelines implemented *See Row Information* Yes  Treat  MEWS Interventions Escalated (See documentation below)  Take Vital Signs  Increase Vital Sign Frequency  Yellow: Q 2hr X 2 then Q 4hr X 2, if remains yellow, continue Q 4hrs  Escalate  MEWS: Escalate Yellow: discuss with charge nurse/RN and consider discussing with provider and RRT  Notify: Charge Nurse/RN  Name of Charge Nurse/RN Notified Carla  Date Charge Nurse/RN Notified 04/03/20  Time Charge Nurse/RN Notified 2209  Notify: Provider  Provider Name/Title Dr Constance Holster  Date Provider Notified 04/03/20  Time Provider Notified 2208  Notification Type Call  Notification Reason Other (Comment) (Making MD aware of pt status)  Response No new orders  Date of Provider Response 04/03/20  Time of Provider Response 2209  Document  Patient Outcome Other (Comment) (Pt stable)  Progress note created (see row info) Yes

## 2020-04-03 NOTE — Progress Notes (Signed)
Attempted to call report to 6N, was told receiving RN unable to take call, provided name and number.

## 2020-04-03 NOTE — TOC Initial Note (Addendum)
Transition of Care [facility]) - Initial/Assessment Note    Patient Details  Name: Bridget Butler MRN: [id number] Date of Birth: 1946-08-07  Transition of Care [facility]) CM/SW Contact:    Bridget Favre, RN Phone Number: 04/03/2020, 5:04 PM  Clinical Narrative:                 Spoke to patient and daughter Bridget Butler at bedside.   Confirmed face sheet information with patient and Bridget Butler. Patient lives with her son Bridget Butler [phone].   Bridget Butler is from out of town back currently staying to help care for her mother.    Will order trach supplies from [place]. Spoke to [place] with [facility]. Bedside nurse will need to send patient home with 5 days of trach supplies at discharge, an ambu bag, and a trach one size smaller then what she discharges home with.    [facility] will deliver a portable suction machine to patient's room prior to discharge.   Will need to check patient's oxygen saturations to see if patient needs home oxygen or humidified air.   Explained Respiratory will teach patient and family how to care for trach prior to discharge. Currently patient's two visitors are [place]. Her son Bridget Butler will need to be allowed to visit for teaching, will discuss with 6N management.   Discussed home health. Patient has no preference.   [facility].   [facility] does not have any HHSP available in [place] at this time.   Will continue to call home health agencies in morning.   Patient and Bridget Butler aware of all of above.    Expected Discharge Plan: [place] Barriers to Discharge: Continued Medical Work up   Patient Goals and CMS Choice Patient states their goals for this hospitalization and ongoing recovery are:: to return to home CMS Medicare.gov Compare Post Acute Care list provided to:: Patient Choice offered to / list presented to : Patient, Adult Children  Expected Discharge Plan and  Services Expected Discharge Plan: [place]   Discharge Planning Services: CM Consult Post Acute Care Choice: Home Health, Durable Medical Equipment Living arrangements for the past 2 months: Single Family Home                   DME Agency: [facility] Date DME Agency Contacted: 04/03/20 Time DME Agency Contacted: [phone] Representative spoke with at DME Agency: [place] Arrangements/Services Living arrangements for the past 2 months: [place] with:: Adult Children Patient language and need for interpreter reviewed:: Yes Do you feel safe going back to the place where you live?: Yes      Need for Family Participation in Patient Care: Yes (Comment) Care giver support system in place?: Yes (comment)   Criminal Activity/Legal Involvement Pertinent to Current Situation/Hospitalization: No - Comment as needed  Activities of Daily Living      Permission Sought/Granted   Permission granted to share information with : Yes, Verbal Permission Granted  Share Information with NAME: son Bridget Butler, daughter Bridget Butler and daughter Bridget Butler           Emotional Assessment Appearance:: Appears stated age Attitude/Demeanor/Rapport: Engaged Affect (typically observed): Accepting Orientation: : Oriented to Self, Oriented to Place, Oriented to  Time, Oriented to Situation Alcohol / Substance Use: Not Applicable Psych Involvement: No (comment)  Admission  diagnosis:  Subglottic stenosis [J38.6] Patient Active Problem List   Diagnosis Date Noted  . Subglottic stenosis 04/02/2020  . Abnormal thyroid stimulating hormone (TSH) level 04/23/2018  . Class 2 severe obesity due to excess calories with serious comorbidity and body mass index (BMI) of 35.0 to 35.9 in adult ([place]) 03/29/2018  . Insulin dependent type 2 diabetes mellitus, uncontrolled ([place]) 01/31/2018  . Essential hypertension 01/31/2018  . Hyperlipidemia LDL goal <100 01/31/2018  .  Gastroesophageal reflux disease 01/31/2018  . Osteopenia of multiple sites 12/21/2015   PCP:  Rutherford Guys, MD Pharmacy:   [facility], [place] [address] Phone: [phone] Fax: [phone]  [facility] [address] [place]), [place] - [address] [place] ([place]) [place] [address] Phone: [phone] Fax: [phone]  [facility] [place], [place] - [address] [place] [address] Phone: [phone] Fax: [phone]     Social Determinants of Health ([place]) Interventions    Readmission Risk Interventions No flowsheet data found.

## 2020-04-03 NOTE — Progress Notes (Signed)
   Subjective:    Patient ID: Bridget Butler, female    DOB: [birth year], 74 y.o.   MRN: [id number]  HPI Did well overnight.  Rested comfortably.  Review of Systems     Objective:   Physical Exam AF VSS except BP up to 200/91 Alert, NAD #6 cuffed Shiley in place, deflated cuff, some air movement around trach tube     Assessment & Plan:  Subglottic stenosis s/p tracheostomy and dilation  Deflated trach cuff.  SLP consult.  Possible transfer to regular room.

## 2020-04-03 NOTE — Consult Note (Signed)
NAME:  Bridget Butler, MRN:  [id number], DOB:  1946-04-15, LOS: 1 ADMISSION DATE:  04/02/2020, CONSULTATION DATE:  04/02/20 REFERRING MD:  Redmond Baseman  CHIEF COMPLAINT:  Medical Management   Brief History   Bridget Butler is a 74 y.o. female who was admitted 6/14 for elective trach for subglottic stenosis.  PCCM asked to see for assistance with medical management.  History of present illness   Bridget Butler is a 74 y.o. female who has a PMH as below.  She presented to [facility] 6/14 for elective trach and microlaryngoscopy with dilation and CO2 laser and excision of focal cord lesion 2/2 subglottic stenosis and stridor (performed by Dr. Redmond Baseman of ENT).  Post op, PCCM asked to see for assistance with medical management.  SBP on arrival to ICU in low 200's.  Pt stated she did not take her hyzaar earlier this morning.  Pain adequately controlled.  Past Medical History  has Insulin dependent type 2 diabetes mellitus, uncontrolled ([place]); Essential hypertension; Hyperlipidemia LDL goal <100; Gastroesophageal reflux disease; Class 2 severe obesity due to excess calories with serious comorbidity and body mass index (BMI) of 35.0 to 35.9 in adult [facility]); Osteopenia of multiple sites; Abnormal thyroid stimulating hormone (TSH) level; and Subglottic stenosis on their problem list.  Significant Hospital Events   6/14 > admit.  Consults:  PCCM.  Procedures:  Trach 6/14 >   Significant Diagnostic Tests:  None.  Micro Data:  None.  Antimicrobials:  None.   Interim history/subjective:  Comfortable. Pain controlled with tylenol   Objective:  Blood pressure (!) 159/71, pulse 88, temperature 98.5 F (36.9 C), temperature source Oral, resp. rate 15, height 5\' 3"  (1.6 m), weight 89.9 kg, SpO2 99 %.    FiO2 (%):  [28 %-40 %] 28 %   Intake/Output Summary (Last 24 hours) at 04/03/2020 0831 Last data filed at 04/03/2020 0800 Gross per 24 hour  Intake 2528.18 ml  Output 460 ml  Net 2068.18 ml   Filed Weights    04/02/20 1003 04/02/20 1615  Weight: 84.6 kg 89.9 kg    Examination: General: Adult female, in NAD. Neuro: A&O x 3, no deficits. HEENT: [place]/AT. Sclerae anicteric.  Trach C/D/I. Cardiovascular: RRR, no M/R/G.  Lungs: Respirations even and unlabored.  CTA bilaterally, No W/R/R.  Abdomen: BS x 4, soft, NT/ND.  Musculoskeletal: No gross deformities, no edema.  Skin: Intact, warm, no rashes.  Assessment & Plan:   Subglottic stenosis - s/p trach 6/14 (Dr. Redmond Baseman). - Post op care and trach management per ENT. - SLP evaluation to clear for full diet and PMV - Bedside nursing swallow to clear for medications and soft diet.  Hx HTN, HLD. - Labetalol and Hydralazine PRN. - Once able to take PO, continue home hyzaar (HCTZ + losartan while in hospital), pravastatin.  Hx DM. - SSI. - Hold home dulaglutide, glargine, janumet.  Restart once eating prior to discharge.   Best Practice:  Diet: SLP. Will start mechanical soft diet.  Pain/Anxiety/Delirium protocol (if indicated): acetaminophen. VAP protocol (if indicated): N/A. DVT prophylaxis: SCD's. GI prophylaxis: PPI. Glucose control: SSI. Mobility: Bedrest. Code Status: Full. Family Communication: patient updated.  Disposition: ready for med-surg floor. TRH to follow for medical issues.   Labs   CBC: Recent Labs  Lab 03/29/20 1400  WBC 6.0  HGB 11.6*  HCT 36.4  MCV 97.8  PLT 782   Basic Metabolic Panel: Recent Labs  Lab 03/29/20 1400  NA 141  K 4.0  CL  102  CO2 29  GLUCOSE 117*  BUN 15  CREATININE 1.10*  CALCIUM 9.9   GFR: Estimated Creatinine Clearance: 48.5 mL/min (A) (by C-G formula based on SCr of 1.1 mg/dL (H)). Recent Labs  Lab 03/29/20 1400  WBC 6.0   Liver Function Tests: No results for input(s): AST, ALT, ALKPHOS, BILITOT, PROT, ALBUMIN in the last 168 hours. No results for input(s): LIPASE, AMYLASE in the last 168 hours. No results for input(s): AMMONIA in the last 168 hours. ABG No results found  for: PHART, PCO2ART, PO2ART, HCO3, TCO2, ACIDBASEDEF, O2SAT  Coagulation Profile: No results for input(s): INR, PROTIME in the last 168 hours. Cardiac Enzymes: No results for input(s): CKTOTAL, CKMB, CKMBINDEX, TROPONINI in the last 168 hours. HbA1C: Hgb A1c MFr Bld  Date/Time Value Ref Range Status  03/29/2020 02:00 PM 7.0 (H) 4.8 - 5.6 % Final    Comment:    (NOTE) Pre diabetes:          5.7%-6.4%  Diabetes:              >6.4%  Glycemic control for   <7.0% adults with diabetes   12/05/2019 03:08 PM 7.1 (H) 4.8 - 5.6 % Final    Comment:             Prediabetes: 5.7 - 6.4          Diabetes: >6.4          Glycemic control for adults with diabetes: <7.0    CBG: Recent Labs  Lab 04/02/20 1407 04/02/20 1654 04/02/20 1921 04/02/20 2226 04/03/20 0721  GLUCAP 168* 143* 153* 137* 128*    04/03/2020, 8:31 AM

## 2020-04-03 NOTE — Evaluation (Signed)
Clinical/Bedside Swallow Evaluation Patient Details  Name: Bridget Butler MRN: [id number] Date of Birth: [birth year]  Today's Date: 04/03/2020 Time: SLP Start Time (ACUTE ONLY): 0955 SLP Stop Time (ACUTE ONLY): 1005 SLP Time Calculation (min) (ACUTE ONLY): 10 min  Past Medical History:  Past Medical History:  Diagnosis Date  . Cataract   . Diabetes mellitus without complication ([place])   . Dyspnea    on exertion  . GERD (gastroesophageal reflux disease)   . Hyperlipidemia   . Hypertension   . Multiple thyroid nodules   . Thyroid disease    Past Surgical History:  Past Surgical History:  Procedure Laterality Date  . COSMETIC SURGERY Bilateral    eyelids  . GALLBLADDER SURGERY    . MICROLARYNGOSCOPY WITH CO2 LASER AND EXCISION OF VOCAL CORD LESION N/A 04/02/2020   Procedure: MICROLARYNGOSCOPY WITH DILATION AND CO2 LASER AND EXCISION OF VOCAL CORD LESION w/MITOMYCIN C;  Surgeon: Melida Quitter, MD;  Location: [facility];  Service: ENT;  Laterality: N/A;  . TRACHEOSTOMY TUBE PLACEMENT N/A 04/02/2020   Procedure: Awake TRACHEOSTOMY;  Surgeon: Melida Quitter, MD;  Location: [facility] OR;  Service: ENT;  Laterality: N/A;  . TUBAL LIGATION     HPI:  Pt is a 74 yo F admitted for elective trach placement 2/2 subglottic stenosis.  Pt had been experiencing inhalatory stridor which worsesned with talking or exertion. A neck CT scan in March demonstrated an abnormal subglottic airway. Shiley 6 cuffed trach placed by Dr. Redmond Baseman on 04/02/2020   Assessment / Plan / Recommendation Clinical Impression  Pt presents with functional swallowing as assessed clinically.  Pt tolerated all consistencies trialed with no clinical s/s of aspiration including with serial straw sips of thin liquid.  PO trials were administered without PMV in place, given poor tolerance of PMV. Pt exhibited excellent oral clearance of solids. Pt was noted to close eyes during swallow, but denies pain with swallowing. Pt reports consuming regular diet  without swallowing difficulty prior to trach placement.  Reviewed general swallowing precautions with pt.  Recommend regular texture diet and thin liquids. SLP Visit Diagnosis: Dysphagia, unspecified (R13.10)    Aspiration Risk  Mild aspiration risk    Diet Recommendation Regular;Thin liquid   Liquid Administration via: Cup;Straw Medication Administration: Whole meds with liquid (as tolerated) Supervision: Patient able to self feed;Intermittent supervision to cue for compensatory strategies Compensations: Slow rate;Small sips/bites Postural Changes: Seated upright at 90 degrees    Other  Recommendations Oral Care Recommendations: Oral care BID   Follow up Recommendations   continue ST at next level of care     Frequency and Duration min 2x/week  2 weeks       Prognosis Prognosis for Safe Diet Advancement: Good      Swallow Study   General Date of Onset: 04/02/20 HPI: Pt is a 74 yo F admitted for elective trach placement 2/2 subglottic stenosis.  Pt had been experiencing inhalatory stridor which worsesned with talking or exertion. A neck CT scan in March demonstrated an abnormal subglottic airway. Shiley 6 cuffed trach placed by Dr. Redmond Baseman on 04/02/2020 Type of Study: Bedside Swallow Evaluation Diet Prior to this Study: Dysphagia 1 (puree);Thin liquids Temperature Spikes Noted: No Respiratory Status: Trach Collar Behavior/Cognition: Alert;Cooperative;Pleasant mood Oral Cavity Assessment: Within Functional Limits Oral Care Completed by SLP: No Oral Cavity - Dentition: Adequate natural dentition Vision: Functional for self-feeding Self-Feeding Abilities: Able to feed self Patient Positioning: Upright in bed Baseline Vocal Quality: Aphonic Volitional Cough: Weak Volitional Swallow: Able  to elicit    Oral/Motor/Sensory Function Overall Oral Motor/Sensory Function: Within functional limits Facial ROM: Within Functional Limits Facial Symmetry: Within Functional Limits Lingual  ROM: Within Functional Limits Lingual Symmetry: Within Functional Limits Lingual Strength: Within Functional Limits Velum: Within Functional Limits Mandible: Within Functional Limits   Ice Chips Ice chips: Within functional limits Presentation: Spoon   Thin Liquid Thin Liquid: Within functional limits Presentation: Cup;Spoon;Straw    Nectar Thick Nectar Thick Liquid: Not tested   Honey Thick Honey Thick Liquid: Not tested   Puree Puree: Within functional limits Presentation: Spoon   Solid     Solid: Within functional limits Presentation: [place], [place], [place] Office: [phone]  04/03/2020,10:29 AM

## 2020-04-03 NOTE — Plan of Care (Signed)

## 2020-04-04 ENCOUNTER — Ambulatory Visit: Payer: Medicare HMO | Admitting: Critical Care Medicine

## 2020-04-04 DIAGNOSIS — J386 Stenosis of larynx: Principal | ICD-10-CM

## 2020-04-04 LAB — GLUCOSE, CAPILLARY
Glucose-Capillary: 165 mg/dL — ABNORMAL HIGH (ref 70–99)
Glucose-Capillary: 208 mg/dL — ABNORMAL HIGH (ref 70–99)
Glucose-Capillary: 145 mg/dL — ABNORMAL HIGH (ref 70–99)
Glucose-Capillary: 219 mg/dL — ABNORMAL HIGH (ref 70–99)

## 2020-04-04 MED ORDER — POLYETHYLENE GLYCOL 3350 17 G PO PACK
17.0000 g | PACK | Freq: Every day | ORAL | Status: DC
  Administered 2020-04-05 – 2020-04-08 (×3): 17 g via ORAL
  Filled 2020-04-04 (×5): qty 1

## 2020-04-04 MED ORDER — SENNOSIDES-DOCUSATE SODIUM 8.6-50 MG PO TABS
1.0000 | ORAL_TABLET | Freq: Two times a day (BID) | ORAL | Status: DC
  Administered 2020-04-04 – 2020-04-10 (×7): 1 via ORAL
  Filled 2020-04-04 (×11): qty 1

## 2020-04-04 NOTE — Progress Notes (Signed)
   Subjective:    Patient ID: Bridget Butler, female    DOB: [birth year], 74 y.o.   MRN: [id number]  HPI She is doing fairly well, in regular room, taking normal diet.  Was not able to tolerate Passy-Muir valve.  Review of Systems     Objective:   Physical Exam AF VSS Alert, NAD #6 Shiley trach in place with cuff deflated, unable to move air around tube when occluded     Assessment & Plan:  Subglottic stenosis s/p tracheostomy and dilation  In regular room, taking regular diet.  Still not moving air around trach tube, will limited Passy-Muir valve use.  Hopefully, this will improve with time and healing.  Ambulate in halls today.

## 2020-04-04 NOTE — Plan of Care (Signed)
  Problem: Activity: Goal: Risk for activity intolerance will decrease Outcome: Progressing   Problem: Clinical Measurements: Goal: Ability to maintain clinical measurements within normal limits will improve Outcome: Progressing   Problem: Nutrition: Goal: Adequate nutrition will be maintained Outcome: Progressing   Problem: Pain Managment: Goal: General experience of comfort will improve Outcome: Progressing

## 2020-04-04 NOTE — Plan of Care (Signed)
  Problem: Education: Goal: Knowledge of General Education information will improve Description: Including pain rating scale, medication(s)/side effects and non-pharmacologic comfort measures Outcome: Progressing  Aeb pt verbalizes poc this AM Problem: Health Behavior/Discharge Planning: Goal: Ability to manage health-related needs will improve Outcome: Progressing  Aeb pt participates in poc Problem: Clinical Measurements: Goal: Will remain free from infection Outcome: Progressing  Aeb no new s/s

## 2020-04-04 NOTE — TOC Progression Note (Addendum)
Transition of Care [facility]) - Progression Note    Patient Details  Name: Bridget Butler MRN: [id number] Date of Birth: September 26, 1946  Transition of Care [facility]) CM/SW Contact  Nazaret Chea, Edson Snowball, RN Phone Number: 04/04/2020, 11:50 AM  Clinical Narrative:     Received a message from Dr Redmond Baseman that patient will most likely discharge to home with a cuffless number 6 shiley , and humidified. Change trach at earliest Saturday but most likely Monday. Will work on [employer] and supplies  Spoke to [place] with [facility] , they do not take new trachs.   6n staff will need to send patient home with a trach one size smaller and ambu bag and 5 days of trach supplies.  Cheryl with [facility] , left message, awaiting call back. Malachy Mood unable to accept due to insurance.   Awaiting call back from [facility] with [facility]. [facility] does not accept new trachs   [facility] and [facility] do not take new trachs  Spoke to [place] with [facility], she is unable to accept due to insurance.   Spoke to [place] with [facility] checking to see if she can accept referral. Hoyle Sauer with [facility] can accept patient if discharge is Monday.    Called [facility] respiratory department [phone] to arrange patient/family education left a message. Awaiting call back.   Levada Dy [phone] , [place] from respiratory called back and transferred me to [facility] [phone].   Respiratory has  to provide education to patient and family before discharge. Respiratory cannot do teaching until trach is changed to cuffless and they cannot do teaching on a weekend. Mickel Baas wanted NCM to arrange a time for Monday afternoon for teaching. NCM spoke to patient and Levada Dy at bedside and explained. Levada Dy and Remo Lipps are available anytime Monday afternoon. NCM called Mickel Baas back, Mickel Baas suggested 3 pm, Levada Dy confirmed she and Remo Lipps will be available. NCM will ask 6 N management for permission for Remo Lipps to visit Monday. Teaching  is arranged for Monday afternoon at 3 pm . I have ordered trach supplies in [employer], [place] will need a paper copy signed by MD. Will let MD know when paper script for trach supplies is ready for signature.   NCM asked bedside nurse Remo Lipps to order ambu bag and trach number 4 and 5 days days worth of trach supplies.    Expected Discharge Plan: [place] Barriers to Discharge: Continued Medical Work up  Expected Discharge Plan and Services Expected Discharge Plan: [place]   Discharge Planning Services: CM Consult Post Acute Care Choice: Home Health, Durable Medical Equipment Living arrangements for the past 2 months: Single Family Home                   DME Agency: [employer] Date DME Agency Contacted: 04/03/20 Time DME Agency Contacted: [phone] Representative spoke with at DME Agency: [place]             Social Determinants of Health ([place]) Interventions    Readmission Risk Interventions No flowsheet data found.

## 2020-04-04 NOTE — Progress Notes (Signed)
SLP Cancellation Note  Patient Details Name: Bridget Butler MRN: [id number] DOB: 1946-07-12   Cancelled treatment:       Reason Eval/Treat Not Completed: Patient at procedure or test/unavailable - pt currently unavailable due to toileting needs. Will continue efforts for PMSV trials and to assess diet tolerance. RN aware.  [name], [facility], [place] Speech Language Pathologist Office: [phone] Pager: [phone]  [name] 04/04/2020, 11:41 AM

## 2020-04-04 NOTE — Progress Notes (Signed)
PROGRESS NOTE    Bridget Butler  YFV:[id number] DOB: 07-Sep-1946 DOA: 04/02/2020 PCP: Rutherford Guys, MD   Brief Narrative:  Bridget Butler is a 74 y.o. female who has known medical history of Insulin dependent type 2 diabetes mellitus, uncontrolled ([place]); Essential hypertension; Hyperlipidemia LDL goal <100; Gastroesophageal reflux disease; Class 2 severe obesity due to excess calories with serious comorbidity and body mass index (BMI) of 35.0 to 35.9 in adult [facility]); Osteopenia of multiple sites; Abnormal thyroid stimulating hormone (TSH) level; and Subglottic stenosis. She presented to [facility] 6/14 for elective trach and microlaryngoscopy with dilation and CO2 laser and excision of focal cord lesion 2/2 subglottic stenosis and stridor (performed by Dr. Redmond Baseman of ENT). Post op, PCCM asked to see for assistance with medical management and subsequently stable for discharge to general medical floor hospitalist was consulted for medication management.   Assessment & Plan:   Active Problems:   Subglottic stenosis  Subglottic stenosis - s/p trach 6/14 (Dr. Redmond Baseman). - Post op care and trach management per ENT. - SLP following  HTN, HLD. - Continue as needed labetalol and Hydralazine for hypertension. -Resume home HCTZ, losartan, pravastatin, aspirin.  Hx DM. -Continue sliding scale insulin, hypoglycemic protocol   -Resume home medications (dulaglutide, glargine, janumet) at discharge.   DVT prophylaxis: Per primary Code Status: Full Procedures:   Tracheostomy 04/02/20  Subjective: Review of systems given patient's poor speech but denies any pain nausea vomiting diarrhea constipation headache fevers or chills.  Objective: Vitals:   04/04/20 0010 04/04/20 0011 04/04/20 0210 04/04/20 0606  BP:  (!) 160/73 (!) 165/67 (!) 154/66  Pulse: (!) 110 (!) 115 (!) 116 (!) 109  Resp: 19 17 18 18   Temp:  99.1 F (37.3 C) 99.3 F (37.4 C) 98.3 F (36.8 C)  TempSrc:  Oral Oral Oral  SpO2: 100% 100%  100% 100%  Weight:      Height:        Intake/Output Summary (Last 24 hours) at 04/04/2020 0806 Last data filed at 04/04/2020 0622 Gross per 24 hour  Intake 403.9 ml  Output 550 ml  Net -146.1 ml   Filed Weights   04/02/20 1003 04/02/20 1615  Weight: 84.6 kg 89.9 kg    Examination:  General:  Pleasantly resting in bed, No acute distress. HEENT:  Normocephalic atraumatic.  Sclerae nonicteric, noninjected.  Extraocular movements intact bilaterally. Neck:  Without mass or deformity.  Trachea is midline -tracheostomy site clean dry intact Lungs:  Clear to auscultate bilaterally without rhonchi, wheeze, or rales. Heart:  Regular rate and rhythm.  Without murmurs, rubs, or gallops. Abdomen:  Soft, nontender, nondistended.  Without guarding or rebound. Extremities: Without cyanosis, clubbing, edema, or obvious deformity. Vascular:  Dorsalis pedis and posterior tibial pulses palpable bilaterally. Skin:  Warm and dry, no erythema, no ulcerations.  Data Reviewed: I have personally reviewed following labs and imaging studies  CBC: Recent Labs  Lab 03/29/20 1400  WBC 6.0  HGB 11.6*  HCT 36.4  MCV 97.8  PLT 163   Basic Metabolic Panel: Recent Labs  Lab 03/29/20 1400  NA 141  K 4.0  CL 102  CO2 29  GLUCOSE 117*  BUN 15  CREATININE 1.10*  CALCIUM 9.9   GFR: Estimated Creatinine Clearance: 48.5 mL/min (A) (by C-G formula based on SCr of 1.1 mg/dL (H)). Liver Function Tests: No results for input(s): AST, ALT, ALKPHOS, BILITOT, PROT, ALBUMIN in the last 168 hours. No results for input(s): LIPASE, AMYLASE in the last  168 hours. No results for input(s): AMMONIA in the last 168 hours. Coagulation Profile: No results for input(s): INR, PROTIME in the last 168 hours. Cardiac Enzymes: No results for input(s): CKTOTAL, CKMB, CKMBINDEX, TROPONINI in the last 168 hours. BNP (last 3 results) No results for input(s): PROBNP in the last 8760 hours. HbA1C: No results for input(s):  HGBA1C in the last 72 hours. CBG: Recent Labs  Lab 04/03/20 0721 04/03/20 1130 04/03/20 1545 04/03/20 2114 04/04/20 0743  GLUCAP 128* 141* 152* 147* 165*   Lipid Profile: No results for input(s): CHOL, HDL, LDLCALC, TRIG, CHOLHDL, LDLDIRECT in the last 72 hours. Thyroid Function Tests: No results for input(s): TSH, T4TOTAL, FREET4, T3FREE, THYROIDAB in the last 72 hours. Anemia Panel: No results for input(s): VITAMINB12, FOLATE, FERRITIN, TIBC, IRON, RETICCTPCT in the last 72 hours. Sepsis Labs: No results for input(s): PROCALCITON, LATICACIDVEN in the last 168 hours.  Recent Results (from the past 240 hour(s))  SARS CORONAVIRUS 2 (TAT 6-24 HRS) Nasopharyngeal Nasopharyngeal Swab     Status: None   Collection Time: 03/29/20  3:13 PM   Specimen: Nasopharyngeal Swab  Result Value Ref Range Status   SARS Coronavirus 2 NEGATIVE NEGATIVE Final    Comment: (NOTE) SARS-CoV-2 target nucleic acids are NOT DETECTED.  The SARS-CoV-2 RNA is generally detectable in upper and lower respiratory specimens during the acute phase of infection. Negative results do not preclude SARS-CoV-2 infection, do not rule out co-infections with other pathogens, and should not be used as the sole basis for treatment or other patient management decisions. Negative results must be combined with clinical observations, patient history, and epidemiological information. The expected result is Negative.  Fact Sheet for Patients: SugarRoll.be  Fact Sheet for Healthcare Providers: https://www.woods-mathews.com/  This test is not yet approved or cleared by the [place] FDA and  has been authorized for detection and/or diagnosis of SARS-CoV-2 by FDA under an Emergency Use Authorization (EUA). This EUA will remain  in effect (meaning this test can be used) for the duration of the COVID-19 declaration under Se ction 564(b)(1) of the Act, 21 U.S.C. section  360bbb-3(b)(1), unless the authorization is terminated or revoked sooner.  Performed at [facility], [place] [address], [place], [place] [address]   MRSA PCR Screening     Status: None   Collection Time: 04/02/20  4:08 PM   Specimen: Nasal Mucosa; Nasopharyngeal  Result Value Ref Range Status   MRSA by PCR NEGATIVE NEGATIVE Final    Comment:        The GeneXpert MRSA Assay (FDA approved for NASAL specimens only), is one component of a comprehensive MRSA colonization surveillance program. It is not intended to diagnose MRSA infection nor to guide or monitor treatment for MRSA infections. Performed at [facility], [place] [address], [place], [place] [address]          Radiology Studies: No results found.      Scheduled Meds: . aspirin  81 mg Oral Daily  . chlorhexidine  15 mL Mouth Rinse BID  . Chlorhexidine Gluconate Cloth  6 each Topical Daily  . hydrochlorothiazide  12.5 mg Oral Daily  . insulin aspart  0-15 Units Subcutaneous TID WC  . insulin aspart  0-5 Units Subcutaneous QHS  . losartan  50 mg Oral Daily  . mouth rinse  15 mL Mouth Rinse q12n4p  . pantoprazole  80 mg Oral Daily  . pravastatin  40 mg Oral QHS   Continuous Infusions:   LOS:  2 days   Time spent: 25 min  Little Ishikawa, DO [facility]  If 7PM-7AM, please contact night-coverage [email or web address]  04/04/2020, 8:06 AM

## 2020-04-05 LAB — GLUCOSE, CAPILLARY
Glucose-Capillary: 202 mg/dL — ABNORMAL HIGH (ref 70–99)
Glucose-Capillary: 199 mg/dL — ABNORMAL HIGH (ref 70–99)
Glucose-Capillary: 164 mg/dL — ABNORMAL HIGH (ref 70–99)
Glucose-Capillary: 153 mg/dL — ABNORMAL HIGH (ref 70–99)

## 2020-04-05 MED ORDER — ENOXAPARIN SODIUM 30 MG/0.3ML ~~LOC~~ SOLN
30.0000 mg | SUBCUTANEOUS | Status: DC
  Administered 2020-04-05 – 2020-04-09 (×5): 30 mg via SUBCUTANEOUS
  Filled 2020-04-05 (×5): qty 0.3

## 2020-04-05 NOTE — Progress Notes (Signed)
PROGRESS NOTE    Bridget Butler  HBZ:[id number] DOB: [birth year] DOA: 04/02/2020 PCP: Rutherford Guys, MD   Brief Narrative:  Bridget Butler is a 74 y.o. female who has known medical history of Insulin dependent type 2 diabetes mellitus, uncontrolled ([place]); Essential hypertension; Hyperlipidemia LDL goal <100; Gastroesophageal reflux disease; Class 2 severe obesity due to excess calories with serious comorbidity and body mass index (BMI) of 35.0 to 35.9 in adult [facility]); Osteopenia of multiple sites; Abnormal thyroid stimulating hormone (TSH) level; and Subglottic stenosis. She presented to [facility] 6/14 for elective trach and microlaryngoscopy with dilation and CO2 laser and excision of focal cord lesion 2/2 subglottic stenosis and stridor (performed by Dr. Redmond Baseman of ENT). Post op, PCCM asked to see for assistance with medical management and subsequently stable for discharge to general medical floor hospitalist was consulted for medication management.   Assessment & Plan:   Active Problems:   Subglottic stenosis  Subglottic stenosis - s/p trach 6/14 (Dr. Redmond Baseman). - Post op care and trach management per ENT. - SLP following  HTN, HLD. - Continue as needed labetalol and Hydralazine for hypertension. - Resume home HCTZ, losartan, pravastatin, aspirin.  Hx DM. - Continue sliding scale insulin, hypoglycemic protocol  - Continues to require only small dose sliding scale - advance lantus once PO intake improves and begins to require more sliding scale/24h period - Resume home medications (dulaglutide, glargine, janumet) at discharge.   DVT prophylaxis: Per primary Code Status: Full Procedures:   Tracheostomy 04/02/20  Subjective: Review of systems given patient's poor speech but denies any pain nausea vomiting diarrhea constipation headache fevers or chills - appears to be eating well. She indicates she would like to get out of bed to walk/sit in bedside chair which seems  reasonable.  Objective: Vitals:   04/04/20 2103 04/04/20 2345 04/05/20 0330 04/05/20 0841  BP: 140/65     Pulse: (!) 108 98 (!) 101 (!) 113  Resp: 17 18 18 18   Temp: 99.1 F (37.3 C)     TempSrc: Oral     SpO2: 100% 98% 98% 97%  Weight:      Height:        Intake/Output Summary (Last 24 hours) at 04/05/2020 0907 Last data filed at 04/05/2020 0051 Gross per 24 hour  Intake 230 ml  Output 600 ml  Net -370 ml   Filed Weights   04/02/20 1003 04/02/20 1615  Weight: 84.6 kg 89.9 kg    Examination:  General:  Pleasantly resting in bed, No acute distress. HEENT:  Normocephalic atraumatic.  Sclerae nonicteric, noninjected.  Extraocular movements intact bilaterally. Neck:  Without mass or deformity.  Trachea is midline -tracheostomy site clean dry intact Lungs:  Clear to auscultate bilaterally without rhonchi, wheeze, or rales. Heart:  Regular rate and rhythm.  Without murmurs, rubs, or gallops. Abdomen:  Soft, nontender, nondistended.  Without guarding or rebound. Extremities: Without cyanosis, clubbing, edema, or obvious deformity. Vascular:  Dorsalis pedis and posterior tibial pulses palpable bilaterally. Skin:  Warm and dry, no erythema, no ulcerations.  Data Reviewed: I have personally reviewed following labs and imaging studies  CBC: Recent Labs  Lab 03/29/20 1400  WBC 6.0  HGB 11.6*  HCT 36.4  MCV 97.8  PLT 101   Basic Metabolic Panel: Recent Labs  Lab 03/29/20 1400  NA 141  K 4.0  CL 102  CO2 29  GLUCOSE 117*  BUN 15  CREATININE 1.10*  CALCIUM 9.9   GFR: Estimated Creatinine Clearance:  48.5 mL/min (A) (by C-G formula based on SCr of 1.1 mg/dL (H)). Liver Function Tests: No results for input(s): AST, ALT, ALKPHOS, BILITOT, PROT, ALBUMIN in the last 168 hours. No results for input(s): LIPASE, AMYLASE in the last 168 hours. No results for input(s): AMMONIA in the last 168 hours. Coagulation Profile: No results for input(s): INR, PROTIME in the last 168  hours. Cardiac Enzymes: No results for input(s): CKTOTAL, CKMB, CKMBINDEX, TROPONINI in the last 168 hours. BNP (last 3 results) No results for input(s): PROBNP in the last 8760 hours. HbA1C: No results for input(s): HGBA1C in the last 72 hours. CBG: Recent Labs  Lab 04/04/20 0743 04/04/20 1154 04/04/20 1613 04/04/20 2106 04/05/20 0804  GLUCAP 165* 208* 145* 219* 202*   Lipid Profile: No results for input(s): CHOL, HDL, LDLCALC, TRIG, CHOLHDL, LDLDIRECT in the last 72 hours. Thyroid Function Tests: No results for input(s): TSH, T4TOTAL, FREET4, T3FREE, THYROIDAB in the last 72 hours. Anemia Panel: No results for input(s): VITAMINB12, FOLATE, FERRITIN, TIBC, IRON, RETICCTPCT in the last 72 hours. Sepsis Labs: No results for input(s): PROCALCITON, LATICACIDVEN in the last 168 hours.  Recent Results (from the past 240 hour(s))  SARS CORONAVIRUS 2 (TAT 6-24 HRS) Nasopharyngeal Nasopharyngeal Swab     Status: None   Collection Time: 03/29/20  3:13 PM   Specimen: Nasopharyngeal Swab  Result Value Ref Range Status   SARS Coronavirus 2 NEGATIVE NEGATIVE Final    Comment: (NOTE) SARS-CoV-2 target nucleic acids are NOT DETECTED.  The SARS-CoV-2 RNA is generally detectable in upper and lower respiratory specimens during the acute phase of infection. Negative results do not preclude SARS-CoV-2 infection, do not rule out co-infections with other pathogens, and should not be used as the sole basis for treatment or other patient management decisions. Negative results must be combined with clinical observations, patient history, and epidemiological information. The expected result is Negative.  Fact Sheet for Patients: SugarRoll.be  Fact Sheet for Healthcare Providers: https://www.woods-mathews.com/  This test is not yet approved or cleared by the [place] FDA and  has been authorized for detection and/or diagnosis of SARS-CoV-2 by FDA  under an Emergency Use Authorization (EUA). This EUA will remain  in effect (meaning this test can be used) for the duration of the COVID-19 declaration under Se ction 564(b)(1) of the Act, 21 U.S.C. section 360bbb-3(b)(1), unless the authorization is terminated or revoked sooner.  Performed at [facility], [place] [address], [place], [place] [address]   MRSA PCR Screening     Status: None   Collection Time: 04/02/20  4:08 PM   Specimen: Nasal Mucosa; Nasopharyngeal  Result Value Ref Range Status   MRSA by PCR NEGATIVE NEGATIVE Final    Comment:        The GeneXpert MRSA Assay (FDA approved for NASAL specimens only), is one component of a comprehensive MRSA colonization surveillance program. It is not intended to diagnose MRSA infection nor to guide or monitor treatment for MRSA infections. Performed at [facility], [place] [address], [place], [place] [address]          Radiology Studies: No results found.      Scheduled Meds: . aspirin  81 mg Oral Daily  . chlorhexidine  15 mL Mouth Rinse BID  . Chlorhexidine Gluconate Cloth  6 each Topical Daily  . hydrochlorothiazide  12.5 mg Oral Daily  . insulin aspart  0-15 Units Subcutaneous TID WC  . insulin aspart  0-5 Units Subcutaneous QHS  .  losartan  50 mg Oral Daily  . mouth rinse  15 mL Mouth Rinse q12n4p  . pantoprazole  80 mg Oral Daily  . polyethylene glycol  17 g Oral Daily  . pravastatin  40 mg Oral QHS  . senna-docusate  1 tablet Oral BID   Continuous Infusions:   LOS: 3 days   Time spent: 25 min  Little Ishikawa, DO [facility]  If 7PM-7AM, please contact night-coverage [email or web address]  04/05/2020, 9:07 AM

## 2020-04-05 NOTE — Progress Notes (Signed)
   Subjective:    Patient ID: Bridget Butler, female    DOB: [birth year], 74 y.o.   MRN: [id number]  HPI Doing well.  Breathing comfortably.  Did not tolerate P-M valve well today.  She has not been ambulating.  Review of Systems     Objective:   Physical Exam AF VSS Alert, NAD #6 cuffed Shiley in place with cuff deflated, not moving air around trach     Assessment & Plan:  Subglottic stenosis s/p tracheostomy and dilation  Spoke with nurse.  Patient is unstable when out of bed.  Will request assistance from PT and OT.  Will order Lovenox.  Planning trach change Monday to cuffless which will hopefully improve air movement around tube.  Discharge anticipated Monday or Tuesday.

## 2020-04-05 NOTE — Progress Notes (Signed)
  Speech Language Pathology Treatment: Dysphagia;Passy Muir Speaking valve  Patient Details Name: Bridget Butler MRN: [id number] DOB: 1946-09-12 Today's Date: 04/05/2020 Time: 6945-0388 SLP Time Calculation (min) (ACUTE ONLY): 15 min  Assessment / Plan / Recommendation Clinical Impression  SWALLOWING Pt tolerated regular solid trials and thin liquids with no clinical s/s of aspiration and exhibited good oral clearance of solids.  RN reports no swallowing difficulty.  Pt denies difficulty swallowing. SLP will sign off for dysphagia  PMV Pt remains unable to tolerated PMV. No leak speech present. Breath stacking noted.  Pt was, however, able to generate a productive cough today! Oral expectoration of secretions achieved and phonation was noted with cough, which marks a significant improvement from evaluation 6/15.  Pt cannot sustain phonation with talking and has difficulty passing air through upper airway at rest. Consider changing trach to a smaller size and or cuffless trach if medically appropriate. Pt is using written communication at present.  Pt is cognitively in tact.  Spoke with pt and daughter about trials of finger occlusion if desired, but not to place valve without SLP present.  ST will continue to follow pt here. Family education meeting for trach care is scheduled for Monday at 3 pm.  Pt will need home health SLP services after discharge. PMV trials with SLP only.    HPI HPI: Pt is a 74 yo F admitted for elective trach placement 2/2 subglottic stenosis.  Pt had been experiencing inhalatory stridor which worsesned with talking or exertion. A neck CT scan in March demonstrated an abnormal subglottic airway. Shiley 6 cuffed trach placed by Dr. Redmond Baseman on 04/02/2020      SLP Plan  Continue with current plan of care       Recommendations  Diet recommendations: Regular;Thin liquid Liquids provided via: Cup;Straw Medication Administration: Whole meds with liquid Supervision: Patient  able to self feed Compensations: Slow rate;Small sips/bites Postural Changes and/or Swallow Maneuvers: Seated upright 90 degrees      Patient may use Passy-Muir Speech Valve: with SLP only MD: Please consider changing trach tube to : Cuffless;Smaller size         Oral Care Recommendations: Oral care BID SLP Visit Diagnosis: Dysphagia, unspecified (R13.10);Aphonia (R49.1) Plan: Continue with current plan of care  Home health SLP after discharge       [facility], [place], [place] Office: [phone]  04/05/2020, 1:11 PM

## 2020-04-06 ENCOUNTER — Telehealth: Payer: Self-pay | Admitting: Family Medicine

## 2020-04-06 DIAGNOSIS — E119 Type 2 diabetes mellitus without complications: Secondary | ICD-10-CM

## 2020-04-06 LAB — GLUCOSE, CAPILLARY
Glucose-Capillary: 187 mg/dL — ABNORMAL HIGH (ref 70–99)
Glucose-Capillary: 201 mg/dL — ABNORMAL HIGH (ref 70–99)
Glucose-Capillary: 214 mg/dL — ABNORMAL HIGH (ref 70–99)
Glucose-Capillary: 156 mg/dL — ABNORMAL HIGH (ref 70–99)

## 2020-04-06 MED ORDER — METFORMIN HCL 500 MG PO TABS
1000.0000 mg | ORAL_TABLET | Freq: Every day | ORAL | Status: DC
  Administered 2020-04-07 – 2020-04-10 (×4): 1000 mg via ORAL
  Filled 2020-04-06 (×4): qty 2

## 2020-04-06 MED ORDER — CALCIUM CARBONATE ANTACID 500 MG PO CHEW
1.0000 | CHEWABLE_TABLET | Freq: Three times a day (TID) | ORAL | Status: DC | PRN
  Administered 2020-04-06 – 2020-04-07 (×4): 200 mg via ORAL
  Filled 2020-04-06 (×4): qty 1

## 2020-04-06 MED ORDER — INSULIN GLARGINE 100 UNIT/ML ~~LOC~~ SOLN
20.0000 [IU] | Freq: Every day | SUBCUTANEOUS | Status: DC
  Administered 2020-04-06 – 2020-04-08 (×3): 20 [IU] via SUBCUTANEOUS
  Filled 2020-04-06 (×4): qty 0.2

## 2020-04-06 MED ORDER — LINAGLIPTIN 5 MG PO TABS
5.0000 mg | ORAL_TABLET | Freq: Every day | ORAL | Status: DC
  Administered 2020-04-06 – 2020-04-10 (×5): 5 mg via ORAL
  Filled 2020-04-06 (×5): qty 1

## 2020-04-06 NOTE — Care Management (Signed)
Trach order form signed by MD and faxed to [facility] with [place].  Plan discharge for Monday after trach changed to cuffless.  Education arranged with Respiratory [phone] ) for Monday at 3 pm with patient , daughter Levada Dy, and son Remo Lipps.   Bedside nurse will send ambu bag and a number 4 cuffless trach, and 5 days of trach supplies with patient at discharge. NCM has communicated this to bedside nurse and Delane Ginger management.    Patient will have home health services through [facility]. Will need orders. PT to see patient, will await their evaluation.   [facility] will bring portable suction to patient's room prior to discharge.   Magdalen Spatz RN

## 2020-04-06 NOTE — Plan of Care (Signed)
  Problem: Education: Goal: Knowledge of General Education information will improve Description: Including pain rating scale, medication(s)/side effects and non-pharmacologic comfort measures Outcome: Progressing  Aeb pt verbalizes poc this AM Problem: Health Behavior/Discharge Planning: Goal: Ability to manage health-related needs will improve Outcome: Progressing  Aeb pt complies w poc Problem: Clinical Measurements: Goal: Ability to maintain clinical measurements within normal limits will improve Outcome: Progressing  Aeb no new s/s

## 2020-04-06 NOTE — Evaluation (Signed)
Occupational Therapy Evaluation Patient Details Name: Bridget Butler MRN: [id number] DOB: 1946-07-28 Today's Date: 04/06/2020    History of Present Illness 74 year old female presenting 04/02/20 with subglottic stenosis and inspiratory stridor s/p tracheostomy. PMH significant for DM II, DOE, GERD, HLD, HTN.    Clinical Impression   Patient met lying supine in bed on 5L O2 via trach collar. SpO2 >95% throughout session. Patient is unable to tolerate PMV but was able to mouth/write responses to this therapists home set-up and PLOF questions. Prior to hospital admission, patient was living in a private residence with her son and was independent with all BADLs/IADLs including grocery shopping, cooking/cleaning, and driving. Patient currently presents below baseline level of function requiring set-up assist overall for UB BADLs, Min A overall for LB BADLs, and Min guard to Min A overall for functional transfers without use of AE. Session concluded with patient seated on BSC with call bell within reach, lines/leads intact, and Bridget John, RN aware. Patient would benefit from continued acute skilled OT services in prep for safe d/c home. Follow-up recommendation for [facility] post d/c to maximize independence in prep for return to PLOF.     Follow Up Recommendations  Home health OT    Equipment Recommendations  3 in 1 bedside commode;Tub/shower seat    Recommendations for Other Services       Precautions / Restrictions Precautions Precautions: Fall Restrictions Weight Bearing Restrictions: No      Mobility Bed Mobility Overal bed mobility: Needs Assistance Bed Mobility: Supine to Sit     Supine to sit: Min guard;HOB elevated     General bed mobility comments: Supine to EOB with HOB elevated and use of bed rail  Transfers Overall transfer level: Needs assistance Equipment used: None Transfers: Sit to/from [facility] Sit to Stand: Min guard Stand pivot transfers: Min guard        General transfer comment: Sit to stand from slightly elevated EOB with use of BUE on bed surface and MIn guard. SPT to [facility] with Min guard w/o AD.     Balance Overall balance assessment: Needs assistance Sitting-balance support: Feet supported Sitting balance-Leahy Scale: Good     Standing balance support: No upper extremity supported;During functional activity Standing balance-Leahy Scale: Fair                             ADL either performed or assessed with clinical judgement   ADL Overall ADL's : Needs assistance/impaired     Grooming: Min guard           Upper Body Dressing : Set up   Lower Body Dressing: Minimal assistance   Toilet Transfer: Min guard Toilet Transfer Details (indicate cue type and reason): To BSC at bedside with Min guard and no AD.  Toileting- Clothing Manipulation and Hygiene: Minimal assistance Toileting - Clothing Manipulation Details (indicate cue type and reason): To maintain standing balance with hygiene/clothing management without use of AD             Vision Baseline Vision/History: No visual deficits;Wears glasses Wears Glasses: Reading only Patient Visual Report: No change from baseline Vision Assessment?: Yes;No apparent visual deficits     Perception     Praxis      Pertinent Vitals/Pain Pain Assessment: No/denies pain Faces Pain Scale: No hurt     Hand Dominance Right   Extremity/Trunk Assessment Upper Extremity Assessment Upper Extremity Assessment: Overall WFL for tasks assessed  Lower Extremity Assessment Lower Extremity Assessment: Defer to PT evaluation   Cervical / Trunk Assessment Cervical / Trunk Assessment: Other exceptions   Communication Communication Communication: Tracheostomy   Cognition Arousal/Alertness: Awake/alert Behavior During Therapy: WFL for tasks assessed/performed Overall Cognitive Status: Within Functional Limits for tasks assessed                                      General Comments  Patient able to transcribe in writing and mouth needs. Patient able to answer all of therapists questions. Patient left seated on [facility] with call bell within reach and RN Bridget Butler aware of patient position.      Exercises     Shoulder Instructions      Home Living Family/patient expects to be discharged to:: Private residence Living Arrangements: Children Available Help at Discharge: Available 24 hours/day;Family Type of Home: House Home Access: Stairs to enter [employer] of Steps: 2 Entrance Stairs-Rails: Can reach both (Posts, no railing) Home Layout: One level     Bathroom Shower/Tub: Walk-in Hydrologist: Handicapped height Bathroom Accessibility: No   Home Equipment: Hand held shower head          Prior Functioning/Environment Level of Independence: Independent        Comments: Independent with BADLs/IADLs. Patient was still driving.         OT Problem List: Decreased strength;Impaired balance (sitting and/or standing)      OT Treatment/Interventions: Self-care/ADL training;Therapeutic exercise;Energy conservation;DME and/or AE instruction;Therapeutic activities;Patient/family education;Balance training    OT Goals(Current goals can be found in the care plan section) Acute Rehab OT Goals Patient Stated Goal: To return home. OT Goal Formulation: With patient Time For Goal Achievement: 04/20/20 Potential to Achieve Goals: Good ADL Goals Pt Will Perform Grooming: with modified independence;standing Pt Will Perform Upper Body Dressing: with modified independence;sitting;standing Pt Will Perform Lower Body Dressing: with modified independence;sit to/from stand Pt Will Transfer to Toilet: with modified independence;grab bars;ambulating;regular height toilet Pt Will Perform Toileting - Clothing Manipulation and hygiene: with modified independence;sit to/from stand Pt Will Perform Tub/Shower Transfer:  Shower transfer;with modified independence;ambulating;shower seat;grab bars  OT Frequency: Min 2X/week   Barriers to D/C:            Co-evaluation              AM-PAC OT "6 Clicks" Daily Activity     Outcome Measure Help from another person eating meals?: None Help from another person taking care of personal grooming?: A Little Help from another person toileting, which includes using toliet, bedpan, or urinal?: A Little Help from another person bathing (including washing, rinsing, drying)?: A Little Help from another person to put on and taking off regular upper body clothing?: A Little Help from another person to put on and taking off regular lower body clothing?: A Little 6 Click Score: 19   End of Session Equipment Utilized During Treatment: Gait belt;Oxygen (5L O2 via trach collar) Nurse Communication: Mobility status  Activity Tolerance: Patient tolerated treatment well Patient left: in chair;with call bell/phone within reach;with chair alarm set  OT Visit Diagnosis: Muscle weakness (generalized) (M62.81)                Time: 3244-    Charges:  OT General Charges $OT Visit: 1 Visit OT Evaluation $OT Eval Moderate Complexity: 1 Mod OT Treatments $Self Care/Home Management : 8-22 mins  Bridget Holloran H.  OTR/L Supplemental OT, Department of rehab services [phone]  Bridget Butler R H. 04/06/2020, 9:18 AM

## 2020-04-06 NOTE — Progress Notes (Signed)
  PROGRESS NOTE    Bridget Butler  CHY:[id number] DOB: 1945-12-26 DOA: 04/02/2020 PCP: Rutherford Guys, MD   Brief Narrative:  Bridget Butler is a 74 y.o. female who has known medical history of Insulin dependent type 2 diabetes mellitus, uncontrolled ([place]); Essential hypertension; Hyperlipidemia LDL goal <100; Gastroesophageal reflux disease; Class 2 severe obesity due to excess calories with serious comorbidity and body mass index (BMI) of 35.0 to 35.9 in adult [employer]); Osteopenia of multiple sites; Abnormal thyroid stimulating hormone (TSH) level; and Subglottic stenosis. She presented to [facility] 6/14 for elective trach and microlaryngoscopy with dilation and CO2 laser and excision of focal cord lesion 2/2 subglottic stenosis and stridor (performed by Dr. Redmond Baseman of ENT). Post op, PCCM asked to see for assistance with medical management and subsequently stable for discharge to general medical floor hospitalist was consulted for medication management.   Assessment & Plan:   Active Problems:   Subglottic stenosis  Hx DM. - Continue sliding scale insulin, hypoglycemic protocol  - Continues to require only small dose sliding scale - advance lantus once PO intake improves and begins to require more sliding scale/24h period - Resume home medications (dulaglutide, glargine, janumet) at discharge.  Subglottic stenosis - s/p trach 6/14 (Dr. Redmond Baseman). - Post op care and trach management per ENT - resizing planned tentatively 04/09/20. - SLP following  HTN, HLD - Continue home HCTZ, losartan, pravastatin, aspirin.  CBG: Recent Labs  Lab 04/05/20 0804 04/05/20 1207 04/05/20 1715 04/05/20 2046 04/06/20 0747  GLUCAP 202* 199* 164* 153* 187*    LOS: 4 days   Time spent: 15 min  Little Ishikawa, DO [facility]  If 7PM-7AM, please contact night-coverage [email or web address]  04/06/2020, 11:36 AM

## 2020-04-06 NOTE — Telephone Encounter (Signed)
Home Health Verbal Orders - Caller/Agency: Tanya, [place] Number: [phone] Requesting OT/PT/Skilled Nursing/Social Work/Speech Therapy: Speech therapy and skilled nursing  Frequency: Eval

## 2020-04-06 NOTE — Progress Notes (Signed)
   Subjective:    Patient ID: Bridget Butler, female    DOB: 1946/03/14, 74 y.o.   MRN: [id number]  HPI Doing well.  Swallowing well.  Ambulated with PT.  Still not tolerating P-M valve.  Review of Systems     Objective:   Physical Exam AF VSS Alert, NAD #6 cuffed Shiley in place with cuff deflated     Assessment & Plan:  Subglottic stenosis s/p tracheostomy and dilation  Doing well.  Plan trach change Monday to cuffless tube.  Likely will tolerate P-M valve better at that point.  Education planned for Monday.  Likely discharge home Tuesday.  Will resume diabetic medications.

## 2020-04-06 NOTE — Evaluation (Signed)
Physical Therapy Evaluation & Discharge  Patient Details Name: Bridget Butler MRN: [id number] DOB: 17-Nov-1945 Today's Date: 04/06/2020   History of Present Illness  74 year old female presenting 04/02/20 with subglottic stenosis and inspiratory stridor s/p tracheostomy. PMH significant for DM II, DOE, GERD, HLD, HTN.   Clinical Impression  Pt presents with no major decrease in functional mobility secondary to above. PTA, pt is independent living with family in one story home . Educ on use of venturi trach collar piece with portable oxygen and how to set FIO2. Today, pt able to complete transfers independently. Pt able to amb and complete stair mobility supervision for safety. Pt is is safe from functional mobility stand point and no longer requires physical therapy services acutely.     Follow Up Recommendations No PT follow up    Equipment Recommendations  None recommended by PT    Recommendations for Other Services       Precautions / Restrictions Precautions Precautions: None      Mobility  Bed Mobility            General bed mobility comments: Received in recliner  Transfers Overall transfer level: Independent Equipment used: None Transfers: Sit to/from [employer] transfer comment: Required ssistance only for portable oxygen  Ambulation/Gait Ambulation/Gait assistance: Supervision Gait Distance (Feet): 510 Feet Assistive device: None Gait Pattern/deviations: Step-through pattern Gait velocity: WNL   General Gait Details: Pt able to amb with supervision only for portable oxygen. Pt reported she felt good with amb and spo2 >95% on 4L o2 by trach mask.  Stairs Stairs: Yes Stairs assistance: Supervision Stair Management: No rails Number of Stairs: 2 General stair comments: supervision did not use rail  Wheelchair Mobility    Modified Rankin (Stroke Patients Only)       Balance Overall balance assessment: No apparent balance deficits (not  formally assessed)                             High Level Balance Comments: Pt was able to sit at edge of chair and don underwear, pad mod(I) for increased time, pt able to stand up and pull up undergarmet with supervision assistance and no visable LOB.             Pertinent Vitals/Pain Pain Assessment: No/denies pain    Home Living Family/patient expects to be discharged to:: Private residence Living Arrangements: Children Available Help at Discharge: Available 24 hours/day;Family Type of Home: House Home Access: Stairs to enter Entrance Stairs-Rails: Can reach both Entrance Stairs-Number of Steps: 2 Home Layout: One level Home Equipment: Hand held shower head      Prior Function Level of Independence: Independent         Comments: Independent with BADLs/IADLs. Patient was still driving.      Hand Dominance   Dominant Hand: Right    Extremity/Trunk Assessment   Upper Extremity Assessment Upper Extremity Assessment: Defer to OT evaluation    Lower Extremity Assessment Lower Extremity Assessment: Overall WFL for tasks assessed       Communication   Communication: Tracheostomy  Cognition Arousal/Alertness: Awake/alert Behavior During Therapy: WFL for tasks assessed/performed Overall Cognitive Status: Within Functional Limits for tasks assessed  General Comments General comments (skin integrity, edema, etc.): Pt able to communicate via yes/no questions, mouthing words and writing answers down.    Exercises     Assessment/Plan    PT Assessment Patent does not need any further PT services  PT Problem List         PT Treatment Interventions      PT Goals (Current goals can be found in the Care Plan section)  Acute Rehab PT Goals PT Goal Formulation: All assessment and education complete, DC therapy    Frequency     Barriers to discharge        Co-evaluation                AM-PAC PT "6 Clicks" Mobility  Outcome Measure Help needed turning from your back to your side while in a flat bed without using bedrails?: None Help needed moving from lying on your back to sitting on the side of a flat bed without using bedrails?: None Help needed moving to and from a bed to a chair (including a wheelchair)?: None Help needed standing up from a chair using your arms (e.g., wheelchair or bedside chair)?: None Help needed to walk in hospital room?: None Help needed climbing 3-5 steps with a railing? : None 6 Click Score: 24    End of Session Equipment Utilized During Treatment: Gait belt;Oxygen Activity Tolerance: Patient tolerated treatment well Patient left: in chair;with call bell/phone within reach Nurse Communication: Mobility status;Other (comment) (changing to portable oxygen)      Time: 7564-3329 PT Time Calculation (min) (ACUTE ONLY): 24 min   Charges:   PT Evaluation $PT Eval Moderate Complexity: 1 Mod PT Treatments $Gait Training: 8-22 mins        Bridget Butler 04/06/2020   Bridget Butler 04/06/2020, 12:42 PM

## 2020-04-06 NOTE — Telephone Encounter (Signed)
Called Tanya back and gave her the verbal orders for the Speech therapy with skilled nursing.

## 2020-04-07 LAB — CBC
HCT: 30.6 % — ABNORMAL LOW (ref 36.0–46.0)
Hemoglobin: 9.9 g/dL — ABNORMAL LOW (ref 12.0–15.0)
MCH: 31.1 pg (ref 26.0–34.0)
MCHC: 32.4 g/dL (ref 30.0–36.0)
MCV: 96.2 fL (ref 80.0–100.0)
Platelets: 226 10*3/uL (ref 150–400)
RBC: 3.18 MIL/uL — ABNORMAL LOW (ref 3.87–5.11)
RDW: 12.6 % (ref 11.5–15.5)
WBC: 9.9 10*3/uL (ref 4.0–10.5)
nRBC: 0 % (ref 0.0–0.2)

## 2020-04-07 LAB — BASIC METABOLIC PANEL
Anion gap: 11 (ref 5–15)
BUN: 23 mg/dL (ref 8–23)
CO2: 28 mmol/L (ref 22–32)
Calcium: 9.8 mg/dL (ref 8.9–10.3)
Chloride: 97 mmol/L — ABNORMAL LOW (ref 98–111)
Creatinine, Ser: 1.22 mg/dL — ABNORMAL HIGH (ref 0.44–1.00)
GFR calc Af Amer: 51 mL/min — ABNORMAL LOW (ref >60)
GFR calc non Af Amer: 44 mL/min — ABNORMAL LOW (ref >60)
Glucose, Bld: 207 mg/dL — ABNORMAL HIGH (ref 70–99)
Potassium: 3.8 mmol/L (ref 3.5–5.1)
Sodium: 136 mmol/L (ref 135–145)

## 2020-04-07 LAB — GLUCOSE, CAPILLARY
Glucose-Capillary: 177 mg/dL — ABNORMAL HIGH (ref 70–99)
Glucose-Capillary: 252 mg/dL — ABNORMAL HIGH (ref 70–99)
Glucose-Capillary: 89 mg/dL (ref 70–99)
Glucose-Capillary: 198 mg/dL — ABNORMAL HIGH (ref 70–99)

## 2020-04-07 LAB — MAGNESIUM: Magnesium: 1.8 mg/dL (ref 1.7–2.4)

## 2020-04-07 NOTE — Progress Notes (Signed)
Patient ID: Bridget Butler, female   DOB: 1946/03/26, 74 y.o.   MRN: [id number] Subjective: Doing well, no new complaints.  Unable to phonate with cuffed tube in place.  Medical issues followed by hospitalist service.  Objective: Vital signs in last 24 hours: Temp:  [97.4 F (36.3 C)-99.1 F (37.3 C)] 98.3 F (36.8 C) (06/19 0525) Pulse Rate:  [88-110] 99 (06/19 0752) Resp:  [15-20] 16 (06/19 0752) BP: (128-149)/(52-64) 149/64 (06/19 0525) SpO2:  [94 %-99 %] 96 % (06/19 0525) FiO2 (%):  [21 %-28 %] 21 % (06/19 0321) Weight change:  Last BM Date: 04/06/20  Intake/Output from previous day: 06/18 0701 - 06/19 0700 In: 390 [P.O.:390] Out: 50 [Urine:50] Intake/Output this shift: No intake/output data recorded.  PHYSICAL EXAM: She is awake and alert.  She is breathing well.  She does have some paroxysms of coughing.  She is unable to phonate.  Tracheostomy in place without any swelling bleeding or any other issues.  Lab Results: No results for input(s): WBC, HGB, HCT, PLT in the last 72 hours. BMET No results for input(s): NA, K, CL, CO2, GLUCOSE, BUN, CREATININE, CALCIUM in the last 72 hours.  Studies/Results: No results found.  Medications: I have reviewed the patient's current medications.  Assessment/Plan: Stable with new tracheostomy.  Plan is to change to a cuffless tube on Monday.  Continue medical care.  LOS: 5 days   [name] 04/07/2020, 8:38 AM

## 2020-04-07 NOTE — Progress Notes (Signed)
  PROGRESS NOTE    Bridget Butler  ZHY:[id number] DOB: [birth year] DOA: 04/02/2020 PCP: Rutherford Guys, MD   Brief Narrative:  Bridget Butler is a 74 y.o. female who has known medical history of Insulin dependent type 2 diabetes mellitus, uncontrolled ([place]); Essential hypertension; Hyperlipidemia LDL goal <100; Gastroesophageal reflux disease; Class 2 severe obesity due to excess calories with serious comorbidity and body mass index (BMI) of 35.0 to 35.9 in adult [facility]); Osteopenia of multiple sites; Abnormal thyroid stimulating hormone (TSH) level; and Subglottic stenosis. She presented to [facility] 6/14 for elective trach and microlaryngoscopy with dilation and CO2 laser and excision of focal cord lesion 2/2 subglottic stenosis and stridor (performed by Dr. Redmond Baseman of ENT). Post op, PCCM asked to see for assistance with medical management and subsequently stable for discharge to general medical floor hospitalist was consulted for medication management.  Assessment & Plan:   Active Problems:   Subglottic stenosis  Hx DM. - Continue sliding scale insulin, hypoglycemic protocol  - Continues to require only low dose sliding scale - lantus 20 daily as PO intake improves and begins to require more sliding scale/24h period - Resume home medications (dulaglutide, janumet) at discharge.  Subglottic stenosis - s/p trach 6/14 (Dr. Redmond Baseman). - Post op care and trach management per ENT - resizing planned tentatively 04/09/20. - SLP following  HTN, HLD - Continue home HCTZ, losartan, pravastatin, aspirin.  CBG: Recent Labs  Lab 04/06/20 0747 04/06/20 1213 04/06/20 1636 04/06/20 2051 04/07/20 0751  GLUCAP 187* 201* 214* 156* 177*    LOS: 5 days   Time spent: 15 min  Little Ishikawa, DO [facility]  If 7PM-7AM, please contact night-coverage [email or web address]  04/07/2020, 8:19 AM

## 2020-04-07 NOTE — Progress Notes (Signed)
   04/07/20 2038  Assess: MEWS Score  Temp 98.9 F (37.2 C)  BP (!) 144/50  Pulse Rate (!) 122  Resp 18  SpO2 97 %  O2 Device Tracheostomy Collar  Patient Activity (if Appropriate) In bed  O2 Flow Rate (L/min) 5 L/min  FiO2 (%) 21 %  Assess: MEWS Score  MEWS Temp 0  MEWS Systolic 0  MEWS Pulse 2  MEWS RR 0  MEWS LOC 0  MEWS Score 2  MEWS Score Color Yellow  Assess: if the MEWS score is Yellow or Red  Were vital signs taken at a resting state? Yes  Focused Assessment Documented focused assessment  Early Detection of Sepsis Score *See Row Information* Medium  MEWS guidelines implemented *See Row Information* Yes  Treat  MEWS Interventions Escalated (See documentation below)  Take Vital Signs  Increase Vital Sign Frequency  Yellow: Q 2hr X 2 then Q 4hr X 2, if remains yellow, continue Q 4hrs  Escalate  MEWS: Escalate Yellow: discuss with charge nurse/RN and consider discussing with provider and RRT  Notify: Charge Nurse/RN  Name of Charge Nurse/RN Notified Emelda  Date Charge Nurse/RN Notified 04/07/20  Time Charge Nurse/RN Notified 2100  Notify: Provider  Date Provider Notified 04/07/20  Time Provider Notified 2207  Notification Type Page

## 2020-04-08 LAB — GLUCOSE, CAPILLARY
Glucose-Capillary: 146 mg/dL — ABNORMAL HIGH (ref 70–99)
Glucose-Capillary: 238 mg/dL — ABNORMAL HIGH (ref 70–99)
Glucose-Capillary: 248 mg/dL — ABNORMAL HIGH (ref 70–99)
Glucose-Capillary: 160 mg/dL — ABNORMAL HIGH (ref 70–99)

## 2020-04-08 NOTE — Progress Notes (Signed)
Patient ID: Bridget Butler, female   DOB: 1946/04/06, 74 y.o.   MRN: [id number] Subjective: No new complaints.  Objective: Vital signs in last 24 hours: Temp:  [98.7 F (37.1 C)-99.8 F (37.7 C)] 99.2 F (37.3 C) (06/20 0819) Pulse Rate:  [93-122] 102 (06/20 0819) Resp:  [14-18] 18 (06/20 0819) BP: (135-156)/(50-73) 135/64 (06/20 0819) SpO2:  [93 %-100 %] 95 % (06/20 0819) FiO2 (%):  [21 %] 21 % (06/20 0809) Weight change:  Last BM Date: 04/06/20  Intake/Output from previous day: 06/19 0701 - 06/20 0700 In: 240 [P.O.:240] Out: 150 [Urine:150] Intake/Output this shift: No intake/output data recorded.  PHYSICAL EXAM: She is awake and alert.  She is not coughing.  Her breathing is clear.  She cannot phonate.  Tracheostomy looks excellent.  Lab Results: Recent Labs    04/07/20 2301  WBC 9.9  HGB 9.9*  HCT 30.6*  PLT 226   BMET Recent Labs    04/07/20 2301  NA 136  K 3.8  CL 97*  CO2 28  GLUCOSE 207*  BUN 23  CREATININE 1.22*  CALCIUM 9.8    Studies/Results: No results found.  Medications: I have reviewed the patient's current medications.  Assessment/Plan: Stable, continue to monitor and plan to change tracheostomy tomorrow.  LOS: 6 days   Bridget Butler 04/08/2020, 11:20 AM

## 2020-04-08 NOTE — Progress Notes (Signed)
Pt states she is able to clear secretions, no tracheal suction needed at this time. Lungs clear/dim bilaterally. 96% on RA.

## 2020-04-08 NOTE — Progress Notes (Signed)
Pt resting comfortably at time of scheduled trach check. No obvious respiratory distress noted. RT will continue to monitor.

## 2020-04-08 NOTE — Progress Notes (Signed)
  PROGRESS NOTE    Bridget Butler  HBZ:[id number] DOB: [birth year] DOA: 04/02/2020 PCP: Rutherford Guys, MD   Brief Narrative:  Bridget Butler is a 74 y.o. female who has known medical history of Insulin dependent type 2 diabetes mellitus, uncontrolled ([place]); Essential hypertension; Hyperlipidemia LDL goal <100; Gastroesophageal reflux disease; Class 2 severe obesity due to excess calories with serious comorbidity and body mass index (BMI) of 35.0 to 35.9 in adult [facility]); Osteopenia of multiple sites; Abnormal thyroid stimulating hormone (TSH) level; and Subglottic stenosis. She presented to [facility] 6/14 for elective trach and microlaryngoscopy with dilation and CO2 laser and excision of focal cord lesion 2/2 subglottic stenosis and stridor (performed by Dr. Redmond Baseman of ENT). Post op, PCCM asked to see for assistance with medical management and subsequently stable for discharge to general medical floor hospitalist was consulted for medication management.  Assessment & Plan:   Active Problems:   Subglottic stenosis  Hx DM. - Continue sliding scale insulin, glargine, linagliptin; PRN hypoglycemic protocol  - Continues to require only low dose sliding scale - lantus 20 daily as PO intake improves and begins to require more sliding scale/24h period - Resume home medications (dulaglutide, janumet) at discharge.  Subglottic stenosis - s/p trach 6/14 (Dr. Redmond Baseman). - Post op care and trach management per ENT - resizing planned tentatively 04/09/20. - SLP following SpO2: 98 % O2 Flow Rate (L/min): 5 L/min FiO2 (%): 21 %  HTN, HLD - Continue home HCTZ, losartan, pravastatin, aspirin.  CBG: Recent Labs  Lab 04/07/20 1221 04/07/20 1717 04/07/20 2041 04/08/20 0815 04/08/20 1323  GLUCAP 252* 89 198* 146* 238*   Examination:  General:  Pleasantly resting in bed, No acute distress. HEENT:  Normocephalic atraumatic.  Sclerae nonicteric, noninjected.  Extraocular movements intact bilaterally. Neck:   Without mass or deformity.  Trachea is midline -tracheostomy site clean dry intact Lungs:  Clear to auscultate bilaterally without rhonchi, wheeze, or rales. Heart:  Regular rate and rhythm.  Without murmurs, rubs, or gallops. Abdomen:  Soft, nontender, nondistended.  Without guarding or rebound. Extremities: Without cyanosis, clubbing, edema, or obvious deformity. Vascular:  Dorsalis pedis and posterior tibial pulses palpable bilaterally. Skin:  Warm and dry, no erythema, no ulcerations.    LOS: 6 days   Time spent: 15 min  Little Ishikawa, DO [facility]  If 7PM-7AM, please contact night-coverage [email or web address]  04/08/2020, 3:22 PM

## 2020-04-09 LAB — CBC
HCT: 28.8 % — ABNORMAL LOW (ref 36.0–46.0)
Hemoglobin: 9.2 g/dL — ABNORMAL LOW (ref 12.0–15.0)
MCH: 30.6 pg (ref 26.0–34.0)
MCHC: 31.9 g/dL (ref 30.0–36.0)
MCV: 95.7 fL (ref 80.0–100.0)
Platelets: 241 10*3/uL (ref 150–400)
RBC: 3.01 MIL/uL — ABNORMAL LOW (ref 3.87–5.11)
RDW: 12.7 % (ref 11.5–15.5)
WBC: 9.5 10*3/uL (ref 4.0–10.5)
nRBC: 0 % (ref 0.0–0.2)

## 2020-04-09 LAB — BASIC METABOLIC PANEL
Anion gap: 10 (ref 5–15)
BUN: 26 mg/dL — ABNORMAL HIGH (ref 8–23)
CO2: 28 mmol/L (ref 22–32)
Calcium: 9.7 mg/dL (ref 8.9–10.3)
Chloride: 99 mmol/L (ref 98–111)
Creatinine, Ser: 1.3 mg/dL — ABNORMAL HIGH (ref 0.44–1.00)
GFR calc Af Amer: 47 mL/min — ABNORMAL LOW (ref >60)
GFR calc non Af Amer: 41 mL/min — ABNORMAL LOW (ref >60)
Glucose, Bld: 152 mg/dL — ABNORMAL HIGH (ref 70–99)
Potassium: 4.1 mmol/L (ref 3.5–5.1)
Sodium: 137 mmol/L (ref 135–145)

## 2020-04-09 LAB — GLUCOSE, CAPILLARY
Glucose-Capillary: 147 mg/dL — ABNORMAL HIGH (ref 70–99)
Glucose-Capillary: 183 mg/dL — ABNORMAL HIGH (ref 70–99)
Glucose-Capillary: 89 mg/dL (ref 70–99)
Glucose-Capillary: 129 mg/dL — ABNORMAL HIGH (ref 70–99)

## 2020-04-09 MED ORDER — INSULIN GLARGINE 100 UNIT/ML ~~LOC~~ SOLN
30.0000 [IU] | Freq: Every day | SUBCUTANEOUS | Status: DC
  Administered 2020-04-09 – 2020-04-10 (×2): 30 [IU] via SUBCUTANEOUS
  Filled 2020-04-09 (×2): qty 0.3

## 2020-04-09 NOTE — Progress Notes (Signed)
RT in to check back on pt since previously pt was feeling some respiratory discomfort. Pt appears to be resting comfortably in bed, no obvious respiratory distress noted. Pt has not required suctioning by RT, but inner cannula was slightly occluded when RT changed it around 2235. Pt remains on 21% ATC. RT will continue to monitor.

## 2020-04-09 NOTE — Progress Notes (Signed)
   Subjective:    Patient ID: Bridget Butler, female    DOB: [birth year], 74 y.o.   MRN: [id number]  HPI Did fine through the weekend.  Some coughing today.  Review of Systems     Objective:   Physical Exam AF VSS Alert, NAD #6 cuffed Shiley in place, cuff deflated     Assessment & Plan:  Subglottic stenosis s/p tracheostomy and dilation  Changed trach to #6 cuffless fenestrated tube without difficulty.  Non-fenestrated inner cannula placed and able to make easy voice and breathe easily around occluded tube.  Passy-Muir valve placed and easily tolerated.  Trach teaching and arrangements today.  Likely discharge tomorrow.

## 2020-04-09 NOTE — Care Management (Addendum)
09:54 Requested RN to bring ambu bag and a number 4 cuffless trach, and 5 days of trach supplies to room for patient to go home with. Requested [employer] to bring portable suction for home use to room. Spoke w Steffanie Dunn RT notified of 3pm, teaching time w family. Patient verified that her daughter and son will be here at 3pm.  11:20 LVM w daughter to remind of 3pm appointment Carles Collet RN BSN CPN Case Management [phone]

## 2020-04-09 NOTE — Progress Notes (Signed)
Occupational Therapy Treatment Patient Details Name: Bridget Butler MRN: [id number] DOB: 1946/03/30 Today's Date: 04/09/2020    History of present illness 74 year old female presenting 04/02/20 with subglottic stenosis and inspiratory stridor s/p tracheostomy. PMH significant for DM II, DOE, GERD, HLD, HTN.    OT comments  Patient making progress toward goals requiring less assistance with UB/LB bathing/dressing tasks this date. Patient ambulated to commode in bathroom with Min guard to supervision A. Patient incontinent of bladder requiring supervision A for UB/LB bath/dress standing/sitting at sink level. Education provided for safety with BADLs and self awareness of need for rest breaks upon return home. Patient expressed verbal understanding. Patient continues to benefit from acute skilled OT services to maximize independence with self-care tasks and decrease need for assistance from others.     Follow Up Recommendations  Home health OT    Equipment Recommendations  3 in 1 bedside commode;Tub/shower seat    Recommendations for Other Services      Precautions / Restrictions Precautions Precautions: None Restrictions Weight Bearing Restrictions: No       Mobility Bed Mobility Overal bed mobility: Needs Assistance Bed Mobility: Supine to Sit     Supine to sit: Supervision     General bed mobility comments: HOB flat with use of bed rail  Transfers Overall transfer level: Needs assistance Equipment used: None Transfers: Sit to/from Stand Sit to Stand: Supervision         General transfer comment: Sit to stand x multiple trials for varrying surfaces with supervision A and cues for hand placement    Balance Overall balance assessment: Needs assistance Sitting-balance support: Feet supported Sitting balance-Leahy Scale: Good Sitting balance - Comments: During bathing/dressing tasks   Standing balance support: Single extremity supported (On sink surface) Standing  balance-Leahy Scale: Fair Standing balance comment: Patient with lateral LOB while attempting to don LB clothing in standing requirig Min A to steady                           ADL either performed or assessed with clinical judgement   ADL Overall ADL's : Needs assistance/impaired     Grooming: Supervision/safety Grooming Details (indicate cue type and reason): Patient completed oral hygiene and face washing standing at sink level.  Upper Body Bathing: Supervision/ safety Upper Body Bathing Details (indicate cue type and reason): Sitting/standing at sink level  Lower Body Bathing: Supervison/ safety Lower Body Bathing Details (indicate cue type and reason): Seated at sink level with increased time and rest breast breaks Upper Body Dressing : Set up;Sitting Upper Body Dressing Details (indicate cue type and reason): To don anterior hospital gown  Lower Body Dressing: Min guard Lower Body Dressing Details (indicate cue type and reason): To don LB clothing in sitting/standing with cues for compensatory technique Toilet Transfer: Copy Details (indicate cue type and reason): To commode in bathroom with close supervision A Toileting- Clothing Manipulation and Hygiene: Min guard Toileting - Clothing Manipulation Details (indicate cue type and reason): Hygiene/clothing management in sitting/standing       General ADL Comments: Patient completed UB/LB bathing dressing with supervision A to Min guard with education on use of compensatory strategies and rest breaks when needed.      Vision       Perception     Praxis      Cognition Arousal/Alertness: Awake/alert Behavior During Therapy: WFL for tasks assessed/performed Overall Cognitive Status: Within Functional Limits for tasks assessed  Exercises     Shoulder Instructions       General Comments Drainage from trach. RN notified.      Pertinent Vitals/ Pain       Pain Assessment: 0-10 Pain Score: 2  Pain Location: Abdomen Pain Intervention(s): Other (comment) (Toileted)  Home Living                                          Prior Functioning/Environment              Frequency  Min 2X/week        Progress Toward Goals  OT Goals(current goals can now be found in the care plan section)  Progress towards OT goals: Progressing toward goals  Acute Rehab OT Goals Patient Stated Goal: To return home. OT Goal Formulation: With patient Time For Goal Achievement: 04/20/20 Potential to Achieve Goals: Good ADL Goals Pt Will Perform Grooming: with modified independence;standing Pt Will Perform Upper Body Dressing: with modified independence;sitting;standing Pt Will Perform Lower Body Dressing: with modified independence;sit to/from stand Pt Will Transfer to Toilet: with modified independence;grab bars;ambulating;regular height toilet Pt Will Perform Toileting - Clothing Manipulation and hygiene: with modified independence;sit to/from stand Pt Will Perform Tub/Shower Transfer: Shower transfer;with modified independence;ambulating;shower seat;grab bars  Plan Discharge plan remains appropriate    Co-evaluation                 AM-PAC OT "6 Clicks" Daily Activity     Outcome Measure   Help from another person eating meals?: None Help from another person taking care of personal grooming?: A Little Help from another person toileting, which includes using toliet, bedpan, or urinal?: A Little Help from another person bathing (including washing, rinsing, drying)?: A Little Help from another person to put on and taking off regular upper body clothing?: A Little Help from another person to put on and taking off regular lower body clothing?: A Little 6 Click Score: 19    End of Session Equipment Utilized During Treatment: Gait belt;Rolling walker;Oxygen (5L via trach collar)  OT Visit  Diagnosis: Muscle weakness (generalized) (M62.81)   Activity Tolerance Patient tolerated treatment well   Patient Left in chair;with call bell/phone within reach   Nurse Communication          Time: 8280-0349 OT Time Calculation (min): 45 min  Charges: OT General Charges $OT Visit: 1 Visit OT Treatments $Self Care/Home Management : 23-37 mins $Therapeutic Activity: 8-22 mins  Felipa Laroche H. OTR/L Supplemental OT, Department of rehab services [phone]   Elic Vencill R H. 04/09/2020, 11:01 AM

## 2020-04-09 NOTE — Progress Notes (Signed)
Trach education provided to patient's 2 daughters. Trach management booklet given to them. Provided demonstration of how to do trach care involving cleaning around stoma, cleaning inner cannula & changing trach ties. Also explained how to suction patient. Daughter recorded me doing all of the above. I had 1 daughter show me that she could take inner cannula out to clean & re insert with locking into place. Other daughter placed drain sponge under trach. All questions answered. Provided number to our trach clinic if ENT would like to refer patient.

## 2020-04-09 NOTE — Progress Notes (Signed)
  PROGRESS NOTE    Bridget Butler  BJS:[id number] DOB: 1946-03-03 DOA: 04/02/2020 PCP: Rutherford Guys, MD   Brief Narrative:  Bridget Butler is a 74 y.o. female who has known medical history of Insulin dependent type 2 diabetes mellitus, uncontrolled ([place]); Essential hypertension; Hyperlipidemia LDL goal <100; Gastroesophageal reflux disease; Class 2 severe obesity due to excess calories with serious comorbidity and body mass index (BMI) of 35.0 to 35.9 in adult [facility]); Osteopenia of multiple sites; Abnormal thyroid stimulating hormone (TSH) level; and Subglottic stenosis. She presented to [facility]14 for elective trach and microlaryngoscopy with dilation and CO2 laser and excision of focal cord lesion 2/2 subglottic stenosis and stridor (performed by Dr. Redmond Baseman of ENT). Post op, PCCM asked to see for assistance with medical management and subsequently stable for discharge to general medical floor hospitalist was consulted for medication management.  Assessment & Plan:   Active Problems:   Subglottic stenosis  Hx DM. - Continue sliding scale insulin, glargine, linagliptin; PRN hypoglycemic protocol  - Continues to require only low dose sliding scale - lantus increased to 30 daily as PO intake improves and begins to require more sliding scale/24h period - Resume home medications (dulaglutide, janumet) at discharge.  Subglottic stenosis - s/p trach 6/14 (Dr. Redmond Baseman). - Post op care and trach management per ENT -exchange this morning to #6 cuffless tolerated well. - SLP following SpO2: 94 % O2 Flow Rate (L/min): 5 L/min FiO2 (%): 21 %  HTN, HLD - Continue home HCTZ, losartan, pravastatin, aspirin.  CBG: Recent Labs  Lab 04/08/20 0815 04/08/20 1323 04/08/20 1619 04/08/20 2121 04/09/20 0753  GLUCAP 146* 238* 248* 160* 147*   Examination:  General:  Pleasantly resting in bed, No acute distress. HEENT:  Normocephalic atraumatic.  Sclerae nonicteric, noninjected.  Extraocular movements  intact bilaterally. Neck:  Without mass or deformity.  Trachea is midline -tracheostomy site clean dry intact Lungs:  Clear to auscultate bilaterally without rhonchi, wheeze, or rales. Heart:  Regular rate and rhythm.  Without murmurs, rubs, or gallops. Abdomen:  Soft, nontender, nondistended.  Without guarding or rebound. Extremities: Without cyanosis, clubbing, edema, or obvious deformity. Vascular:  Dorsalis pedis and posterior tibial pulses palpable bilaterally. Skin:  Warm and dry, no erythema, no ulcerations.    LOS: 7 days   Time spent: 15 min  Little Ishikawa, DO [facility]  If 7PM-7AM, please contact night-coverage [email or web address]  04/09/2020, 7:55 AM

## 2020-04-09 NOTE — Plan of Care (Signed)
  Problem: Education: Goal: Knowledge of General Education information will improve Description Including pain rating scale, medication(s)/side effects and non-pharmacologic comfort measures Outcome: Progressing   

## 2020-04-10 LAB — GLUCOSE, CAPILLARY: Glucose-Capillary: 135 mg/dL — ABNORMAL HIGH (ref 70–99)

## 2020-04-10 NOTE — Plan of Care (Signed)

## 2020-04-10 NOTE — Discharge Summary (Signed)
Physician Discharge Summary  Patient ID: Bridget Butler MRN: [id number] DOB/AGE: 01-25-1946 74 y.o.  Admit date: 04/02/2020 Discharge date: 04/10/2020  Admission Diagnoses: Subglottic stenosis  Discharge Diagnoses:  Active Problems:   Subglottic stenosis   Discharged Condition: good  Hospital Course: 74 year old female presented to hospital for management of subglottic stenosis and inspiratory stridor.  See operative note.  She was observed overnight after surgery in ICU due to a new tracheostomy with assistance of CCM.  She was able to be transferred to a regular room the following day.  She did well with her trach tube with no problems.  The hospitalist service helped manage medical issues.  PT and OT were consulted.  On POD 7, her trach tube was changed to a cuffless tube and she was then able to speak around the tube and begin tolerating the Passy-Muir valve.  She received instruction on trach care.  The following day, she is felt stable for discharge home.  Consults: None  Significant Diagnostic Studies: None  Treatments: surgery: Tracheostomy and SMDL with laser dilation  Discharge Exam: Blood pressure (!) 147/59, pulse (!) 104, temperature 99.5 F (37.5 C), resp. rate 18, height 5\' 3"  (1.6 m), weight 89.9 kg, SpO2 95 %. General appearance: alert, cooperative and no distress Neck: #6 cuffless Shiley in place, making normal voice around occluded tube  Disposition: Discharge disposition: 01-Home or Self Care       Discharge Instructions    Diet - low sodium heart healthy   Complete by: As directed    Discharge instructions   Complete by: As directed    Resume normal daily activities and diet.  Trach care as instructed.   Increase activity slowly   Complete by: As directed    No wound care   Complete by: As directed      Allergies as of 04/10/2020   No Known Allergies     Medication List    TAKE these medications   aspirin EC 81 MG tablet Take 81 mg by mouth  daily.   Basaglar KwikPen 100 UNIT/ML Inject 0.2 mLs (20 Units total) into the skin at bedtime. What changed: when to take this   cholecalciferol 25 MCG (1000 UNIT) tablet Commonly known as: VITAMIN D Take 1,000 Units by mouth at bedtime.   Janumet 50-1000 MG tablet Generic drug: sitaGLIPtin-metformin Take 1 tablet by mouth daily. What changed: when to take this   losartan-hydrochlorothiazide 50-12.5 MG tablet Commonly known as: HYZAAR Take 1 tablet by mouth daily.   omeprazole 40 MG capsule Commonly known as: PRILOSEC Take 40 mg by mouth in the morning and at bedtime.   Pen Needles 32G X 5 MM Misc 1 Units by Does not apply route daily.   pravastatin 40 MG tablet Commonly known as: PRAVACHOL Take 1 tablet (40 mg total) by mouth daily. What changed: when to take this   True Metrix Blood Glucose Test test strip Generic drug: glucose blood Use as instructed   True Metrix Meter Devi 1 Device by Does not apply route 2 (two) times daily.   Trulicity 9.24 QA/8.3MH Sopn Generic drug: Dulaglutide Inject 0.5 mLs into the skin once a week. What changed: additional instructions   VISINE OP Apply 1 drop to eye daily as needed (dry eyes).            Durable Medical Equipment  (From admission, onward)         Start     Ordered   04/04/20 1311  For home use only DME Suction  Once       Comments: Oral and trach suction, need portable   Yankauer and trach suction catheters 12 fr  Question:  Suction  Answer:  Lurline Idol   04/04/20 1311   04/04/20 1309  For home use only DME Trach supplies  Once       Question Answer Comment  Trach Type Shiley   Size number 6 shiley   Cuffed or Uncuffed Uncuffed   Fenestrated No   Does patient need speaking valve? Yes   Trach Humidification Yes   Suction Needed? Yes   Manual Resuscitator Needed? Yes   Emergency Backup Trach size (one size smaller than trach in throat) 4      04/04/20 1311          Follow-up [place] Follow up.   Why: home health services will begin Thursday 6/24 Contact information: [address] [address] [place], [place] [facility] Follow up.   Why: For home tracheostomy supplies and suction Contact information: [address] [place] [address] [phone]        Melida Quitter, MD. Schedule an appointment as soon as possible for a visit in 1 week(s).   Specialty: Otolaryngology Contact information: [address] [place] [address] [phone]               Signed: Melida Quitter 04/10/2020, 7:45 AM

## 2020-04-10 NOTE — Progress Notes (Signed)
Bridget Butler to be D/C'd  per MD order. Discussed with the patient and all questions fully answered.  VSS, Skin clean, dry and intact without evidence of skin break down, no evidence of skin tears noted.  IV catheter discontinued intact. Site without signs and symptoms of complications. Dressing and pressure applied.  An After Visit Summary was printed and given to the patient. Patient received prescription.  D/c education completed with patient/family including follow up instructions, medication list, d/c activities limitations if indicated, with other d/c instructions as indicated by MD - patient able to verbalize understanding, all questions fully answered.   Patient instructed to return to ED, call 911, or call MD for any changes in condition.   Patient to be escorted via [place], and D/C home via private auto.

## 2020-04-10 NOTE — TOC Transition Note (Addendum)
Transition of Care [facility]) - CM/SW Discharge Note   Patient Details  Name: Bridget Butler MRN: [id number] Date of Birth: August 03, 1946  [facility] CM/SW Contact:  Carles Collet, RN Phone Number: 04/10/2020, 8:06 AM   Clinical Narrative:    Confirmed with nurse yesterday that all supplies at bedside for home. Portable suction for home use delivered to room yesterday. Confirmed w [facility] that they will have SLP visit house day after DC on Wednesday.  Instructed bedside RN to send all equipment in room home w her, size 4 and 6 shiley, ambu bag, portable suction, yaunker, and 12 Fr suction, and 5 days worth of cleaning supplies.   Spoke w Thedore Mins of [employer] and provided him with numbers for family to call to set up delivery of DME.  Spoke w daughter at bedside, Bridget Butler, she will call me when equipment has been delivered, and then take patient home via private car     Final next level of care: [place] Barriers to Discharge: No Barriers Identified   Patient Goals and CMS Choice Patient states their goals for this hospitalization and ongoing recovery are:: to return to home CMS Medicare.gov Compare Post Acute Care list provided to:: Patient Choice offered to / list presented to : Patient, Adult Children  Discharge Placement                       Discharge Plan and Services   Discharge Planning Services: CM Consult Post Acute Care Choice: Home Health, Durable Medical Equipment            DME Agency: [employer] Date DME Agency Contacted: 04/03/20 Time DME Agency Contacted: [phone] Representative spoke with at DME Agency: [place]: RN, [place] Agency: [name] Date [place]: 04/10/20 Time [place]: [phone] Representative spoke with at [place]: [place] ([place]) Interventions     Readmission Risk Interventions No flowsheet data found.

## 2020-04-10 NOTE — Care Management Important Message (Signed)
Important Message  Patient Details  Name: Bridget Butler MRN: [id number] Date of Birth: [birth year]   Medicare Important Message Given:  Yes   Patient left prior to delivery of IM.  IM mailed to patient address.   Chandra Asher 04/10/2020, 2:12 PM

## 2020-04-10 NOTE — Plan of Care (Signed)
  Problem: Education: Goal: Knowledge of General Education information will improve Description Including pain rating scale, medication(s)/side effects and non-pharmacologic comfort measures Outcome: Progressing   

## 2020-04-13 DIAGNOSIS — Z794 Long term (current) use of insulin: Secondary | ICD-10-CM | POA: Diagnosis not present

## 2020-04-13 DIAGNOSIS — E785 Hyperlipidemia, unspecified: Secondary | ICD-10-CM | POA: Diagnosis not present

## 2020-04-13 DIAGNOSIS — I10 Essential (primary) hypertension: Secondary | ICD-10-CM | POA: Diagnosis not present

## 2020-04-13 DIAGNOSIS — J386 Stenosis of larynx: Secondary | ICD-10-CM | POA: Diagnosis not present

## 2020-04-13 DIAGNOSIS — E1136 Type 2 diabetes mellitus with diabetic cataract: Secondary | ICD-10-CM | POA: Diagnosis not present

## 2020-04-13 DIAGNOSIS — Z430 Encounter for attention to tracheostomy: Secondary | ICD-10-CM | POA: Diagnosis not present

## 2020-04-16 DIAGNOSIS — Z9889 Other specified postprocedural states: Secondary | ICD-10-CM | POA: Insufficient documentation

## 2020-04-16 DIAGNOSIS — Z930 Tracheostomy status: Secondary | ICD-10-CM | POA: Diagnosis not present

## 2020-04-16 DIAGNOSIS — J386 Stenosis of larynx: Secondary | ICD-10-CM | POA: Diagnosis not present

## 2020-04-17 ENCOUNTER — Other Ambulatory Visit: Payer: Self-pay | Admitting: Otolaryngology

## 2020-04-20 DIAGNOSIS — Z930 Tracheostomy status: Secondary | ICD-10-CM | POA: Diagnosis not present

## 2020-04-25 ENCOUNTER — Ambulatory Visit (INDEPENDENT_AMBULATORY_CARE_PROVIDER_SITE_OTHER): Payer: Medicare HMO | Admitting: Registered Nurse

## 2020-04-25 ENCOUNTER — Other Ambulatory Visit: Payer: Self-pay

## 2020-04-25 VITALS — BP 146/70 | HR 111 | Temp 97.6°F | Ht 63.0 in | Wt 180.8 lb

## 2020-04-25 DIAGNOSIS — R350 Frequency of micturition: Secondary | ICD-10-CM | POA: Diagnosis not present

## 2020-04-25 DIAGNOSIS — Z09 Encounter for follow-up examination after completed treatment for conditions other than malignant neoplasm: Secondary | ICD-10-CM

## 2020-04-25 LAB — POCT URINALYSIS DIP (MANUAL ENTRY)
Bilirubin, UA: NEGATIVE
Glucose, UA: >=1000 mg/dL — AB
Ketones, POC UA: NEGATIVE mg/dL
Nitrite, UA: NEGATIVE
Protein Ur, POC: 100 mg/dL — AB
Spec Grav, UA: 1.02 (ref 1.010–1.025)
Urobilinogen, UA: 0.2 E.U./dL
pH, UA: 6 (ref 5.0–8.0)

## 2020-04-25 LAB — GLUCOSE, POCT (MANUAL RESULT ENTRY)

## 2020-04-25 NOTE — Patient Instructions (Signed)
° ° ° °  If you have lab work done today you will be contacted with your lab results within the next 2 weeks.  If you have not heard from us then please contact us. The fastest way to get your results is to register for My Chart. ° ° °IF you received an x-ray today, you will receive an invoice from [facility]. Please contact [facility] at [phone] with questions or concerns regarding your invoice.  ° °IF you received labwork today, you will receive an invoice from [facility]. Please contact [facility] at [phone] with questions or concerns regarding your invoice.  ° °Our billing staff will not be able to assist you with questions regarding bills from these companies. ° °You will be contacted with the lab results as soon as they are available. The fastest way to get your results is to activate your My Chart account. Instructions are located on the last page of this paperwork. If you have not heard from us regarding the results in 2 weeks, please contact this office. °  ° ° ° °

## 2020-04-26 LAB — CBC WITH DIFFERENTIAL
Basophils Absolute: 0 10*3/uL (ref 0.0–0.2)
Basos: 0 %
EOS (ABSOLUTE): 0 10*3/uL (ref 0.0–0.4)
Eos: 1 %
Hematocrit: 31.1 % — ABNORMAL LOW (ref 34.0–46.6)
Hemoglobin: 10.1 g/dL — ABNORMAL LOW (ref 11.1–15.9)
Immature Grans (Abs): 0 10*3/uL (ref 0.0–0.1)
Immature Granulocytes: 0 %
Lymphocytes Absolute: 1.9 10*3/uL (ref 0.7–3.1)
Lymphs: 27 %
MCH: 30.7 pg (ref 26.6–33.0)
MCHC: 32.5 g/dL (ref 31.5–35.7)
MCV: 95 fL (ref 79–97)
Monocytes Absolute: 0.5 10*3/uL (ref 0.1–0.9)
Monocytes: 7 %
Neutrophils Absolute: 4.8 10*3/uL (ref 1.4–7.0)
Neutrophils: 65 %
RBC: 3.29 x10E6/uL — ABNORMAL LOW (ref 3.77–5.28)
RDW: 12.2 % (ref 11.7–15.4)
WBC: 7.2 10*3/uL (ref 3.4–10.8)

## 2020-04-26 LAB — COMPREHENSIVE METABOLIC PANEL
ALT: 11 IU/L (ref 0–32)
AST: 12 IU/L (ref 0–40)
Albumin/Globulin Ratio: 1.1 — ABNORMAL LOW (ref 1.2–2.2)
Albumin: 3.9 g/dL (ref 3.7–4.7)
Alkaline Phosphatase: 115 IU/L (ref 48–121)
BUN/Creatinine Ratio: 17 (ref 12–28)
BUN: 22 mg/dL (ref 8–27)
Bilirubin Total: <0.2 mg/dL (ref 0.0–1.2)
CO2: 24 mmol/L (ref 20–29)
Calcium: 10 mg/dL (ref 8.7–10.3)
Chloride: 92 mmol/L — ABNORMAL LOW (ref 96–106)
Creatinine, Ser: 1.32 mg/dL — ABNORMAL HIGH (ref 0.57–1.00)
GFR calc Af Amer: 46 mL/min/{1.73_m2} — ABNORMAL LOW (ref >59)
GFR calc non Af Amer: 40 mL/min/{1.73_m2} — ABNORMAL LOW (ref >59)
Globulin, Total: 3.7 g/dL (ref 1.5–4.5)
Glucose: 433 mg/dL — ABNORMAL HIGH (ref 65–99)
Potassium: 4.7 mmol/L (ref 3.5–5.2)
Sodium: 132 mmol/L — ABNORMAL LOW (ref 134–144)
Total Protein: 7.6 g/dL (ref 6.0–8.5)

## 2020-04-27 ENCOUNTER — Telehealth: Payer: Self-pay

## 2020-04-27 NOTE — Telephone Encounter (Signed)
Signed MD orders faxed back to [facility] at [phone].

## 2020-05-09 DIAGNOSIS — J386 Stenosis of larynx: Secondary | ICD-10-CM | POA: Diagnosis not present

## 2020-05-10 DIAGNOSIS — Z930 Tracheostomy status: Secondary | ICD-10-CM | POA: Diagnosis not present

## 2020-05-10 DIAGNOSIS — J386 Stenosis of larynx: Secondary | ICD-10-CM | POA: Diagnosis not present

## 2020-05-15 NOTE — Progress Notes (Signed)
[place], [place] [address] Phone: [phone] Fax: [phone]  [place] [address] [place]), [place] - [place] [address] [facility] [address] [place] ([place]) [place] [address] Phone: [phone] Fax: [phone]  [facility] [place], [place] - [address] [place] [address] Phone: [phone] Fax: [phone]      Your procedure is scheduled on Wednesday August 4  Report to [facility] Main Entrance "A" at 0630 A.M., and check in at the Admitting office.  Call this number if you have problems the morning of surgery:  [phone]  Call [phone] if you have any questions prior to your surgery date Monday-Friday 8am-4pm    Remember:  Do not eat or drink after midnight the night before your surgery    Take these medicines the morning of surgery with A SIP OF WATER  Eye drops if needed  Follow your surgeon's instructions on when to stop Aspirin.  If no instructions were given by your surgeon then you will need to call the office to get those instructions.     As of today, STOP taking any Aspirin (unless otherwise instructed by your surgeon) Aleve, Naproxen, Ibuprofen, Motrin, Advil, Goody's, BC's, all herbal medications, fish oil, and all vitamins.   WHAT DO I DO ABOUT MY DIABETES MEDICATION?   [name] Kitchen Do not take oral diabetes medicines (pills) the morning of surgery. sitaGLIPtin-metformin (JANUMET)  . THE NIGHT BEFORE SURGERY, take _____20______ units of _Insulin Glargine (BASAGLAR KWIKPEN)__________insulin.      The day of surgery, do not take other diabetes injectables, including Byetta (exenatide), Bydureon (exenatide ER), Victoza (liraglutide), or Trulicity (dulaglutide).  . If your CBG is greater than 220 mg/dL, you may take  of your sliding scale (correction) dose of insulin.   HOW TO MANAGE YOUR  DIABETES BEFORE AND AFTER SURGERY  Why is it important to control my blood sugar before and after surgery? . Improving blood sugar levels before and after surgery helps healing and can limit problems. . A way of improving blood sugar control is eating a healthy diet by: o  Eating less sugar and carbohydrates o  Increasing activity/exercise o  Talking with your doctor about reaching your blood sugar goals . High blood sugars (greater than 180 mg/dL) can raise your risk of infections and slow your recovery, so you will need to focus on controlling your diabetes during the weeks before surgery. . Make sure that the doctor who takes care of your diabetes knows about your planned surgery including the date and location.  How do I manage my blood sugar before surgery? . Check your blood sugar at least 4 times a day, starting 2 days before surgery, to make sure that the level is not too high or low. . Check your blood sugar the morning of your surgery when you wake up and every 2 hours until you get to the Short Stay unit. o If your blood sugar is less than 70 mg/dL, you will need to treat for low blood sugar: - Do not take insulin. - Treat a low blood sugar (less than 70 mg/dL) with  cup of clear juice (cranberry or apple), 4 glucose tablets, OR glucose gel. - Recheck blood sugar in 15 minutes after treatment (to make sure it is greater than 70 mg/dL). If your blood sugar is not greater than 70 mg/dL on recheck,  call [phone] for further instructions. . Report your blood sugar to the short stay nurse when you get to Short Stay.  . If you are admitted to the hospital after surgery: o Your blood sugar will be checked by the staff and you will probably be given insulin after surgery (instead of oral diabetes medicines) to make sure you have good blood sugar levels. o The goal for blood sugar control after surgery is 80-180 mg/dL.                     Do not wear jewelry, make up, or nail polish             Do not wear lotions, powders, perfumes, or deodorant.            Do not shave 48 hours prior to surgery.             Do not bring valuables to the hospital.            [facility] is not responsible for any belongings or valuables.  Do NOT Smoke (Tobacco/Vaping) or drink Alcohol 24 hours prior to your procedure If you use a CPAP at night, you may bring all equipment for your overnight stay.   Contacts, glasses, dentures or bridgework may not be worn into surgery.      For patients admitted to the hospital, discharge time will be determined by your treatment team.   Patients discharged the day of surgery will not be allowed to drive home, and someone needs to stay with them for 24 hours.    Special instructions:   [place]- Preparing For Surgery  Before surgery, you can play an important role. Because skin is not sterile, your skin needs to be as free of germs as possible. You can reduce the number of germs on your skin by washing with CHG (chlorahexidine gluconate) Soap before surgery.  CHG is an antiseptic cleaner which kills germs and bonds with the skin to continue killing germs even after washing.    Oral Hygiene is also important to reduce your risk of infection.  Remember - BRUSH YOUR TEETH THE MORNING OF SURGERY WITH YOUR REGULAR TOOTHPASTE  Please do not use if you have an allergy to CHG or antibacterial soaps. If your skin becomes reddened/irritated stop using the CHG.  Do not shave (including legs and underarms) for at least 48 hours prior to first CHG shower. It is OK to shave your face.  Please follow these instructions carefully.   1. Shower the NIGHT BEFORE SURGERY and the MORNING OF SURGERY with CHG Soap.   2. If you chose to wash your hair, wash your hair first as usual with your normal shampoo.  3. After you shampoo, rinse your hair and body thoroughly to remove the shampoo.  4. Use CHG as you would any other liquid soap. You can apply CHG directly to  the skin and wash gently with a scrungie or a clean washcloth.   5. Apply the CHG Soap to your body ONLY FROM THE NECK DOWN.  Do not use on open wounds or open sores. Avoid contact with your eyes, ears, mouth and genitals (private parts). Wash Face and genitals (private parts)  with your normal soap.   6. Wash thoroughly, paying special attention to the area where your surgery will be performed.  7. Thoroughly rinse your body with warm water from the neck down.  8. DO NOT shower/wash with your normal soap  after using and rinsing off the CHG Soap.  9. Pat yourself dry with a CLEAN TOWEL.  10. Wear CLEAN PAJAMAS to bed the night before surgery  11. Place CLEAN SHEETS on your bed the night of your first shower and DO NOT SLEEP WITH PETS.   Day of Surgery: Wear Clean/Comfortable clothing the morning of surgery Do not apply any deodorants/lotions.   Remember to brush your teeth WITH YOUR REGULAR TOOTHPASTE.   Please read over the following fact sheets that you were given.

## 2020-05-16 ENCOUNTER — Encounter (HOSPITAL_COMMUNITY)
Admission: RE | Admit: 2020-05-16 | Discharge: 2020-05-16 | Disposition: A | Discharge status: Home / Self Care | Payer: Medicare HMO | Source: Ambulatory Visit | Attending: Otolaryngology | Admitting: Otolaryngology

## 2020-05-16 ENCOUNTER — Encounter (HOSPITAL_COMMUNITY): Payer: Self-pay

## 2020-05-16 ENCOUNTER — Other Ambulatory Visit: Payer: Self-pay

## 2020-05-16 DIAGNOSIS — Z01812 Encounter for preprocedural laboratory examination: Secondary | ICD-10-CM | POA: Diagnosis not present

## 2020-05-16 LAB — BASIC METABOLIC PANEL
Anion gap: 8 (ref 5–15)
BUN: 14 mg/dL (ref 8–23)
CO2: 27 mmol/L (ref 22–32)
Calcium: 9.7 mg/dL (ref 8.9–10.3)
Chloride: 98 mmol/L (ref 98–111)
Creatinine, Ser: 1.09 mg/dL — ABNORMAL HIGH (ref 0.44–1.00)
GFR calc Af Amer: 58 mL/min — ABNORMAL LOW (ref >60)
GFR calc non Af Amer: 50 mL/min — ABNORMAL LOW (ref >60)
Glucose, Bld: 267 mg/dL — ABNORMAL HIGH (ref 70–99)
Potassium: 3.7 mmol/L (ref 3.5–5.1)
Sodium: 133 mmol/L — ABNORMAL LOW (ref 135–145)

## 2020-05-16 LAB — CBC
HCT: 30.7 % — ABNORMAL LOW (ref 36.0–46.0)
Hemoglobin: 9.7 g/dL — ABNORMAL LOW (ref 12.0–15.0)
MCH: 30.2 pg (ref 26.0–34.0)
MCHC: 31.6 g/dL (ref 30.0–36.0)
MCV: 95.6 fL (ref 80.0–100.0)
Platelets: 271 10*3/uL (ref 150–400)
RBC: 3.21 MIL/uL — ABNORMAL LOW (ref 3.87–5.11)
RDW: 13.6 % (ref 11.5–15.5)
WBC: 8.3 10*3/uL (ref 4.0–10.5)
nRBC: 0 % (ref 0.0–0.2)

## 2020-05-16 LAB — GLUCOSE, CAPILLARY: Glucose-Capillary: 233 mg/dL — ABNORMAL HIGH (ref 70–99)

## 2020-05-16 LAB — NO BLOOD PRODUCTS

## 2020-05-16 NOTE — Progress Notes (Signed)
PCP - Dr. Grant Fontana Cardiologist - denies  PPM/ICD - denies  Chest x-ray - N/A EKG - 04/08/2020 Stress Test - 2006 ECHO - 12/14/2019 Cardiac Cath - denies  Sleep Study - denies CPAP - N/A  Fasting Blood Sugar - 90 - 120 Checks Blood Sugar 2 times a day  Blood Thinner Instructions: N/A Aspirin Instructions: Patient instructed to call surgeon's office today after PAT appointment for instructions on when to stop Aspirin for surgery.  ERAS Protcol - No PRE-SURGERY Ensure or G2- N/A  COVID TEST- Scheduled for 05/19/2020. Patient verbalized understanding of self-quarantine instructions, appointment time and place.  Anesthesia review: YES, CBG Patient stated since tracheostomy placement June of this year, FBS has been 188 - 300s CBG @ PAT appointment: 233 A1C: 7.0 on 03/29/2020  Patient denies shortness of breath, fever, cough and chest pain at PAT appointment  All instructions explained to the patient, with a verbal understanding of the material. Patient agrees to go over the instructions while at home for a better understanding. Patient also instructed to self quarantine after being tested for COVID-19. The opportunity to ask questions was provided.

## 2020-05-17 ENCOUNTER — Other Ambulatory Visit: Payer: Self-pay | Admitting: Otolaryngology

## 2020-05-17 NOTE — Progress Notes (Signed)
Anesthesia Chart Review:  Case: [id number] Date/Time: 05/23/20 0815   Procedure: MICROLARYNGOSCOPY W/ CO2 laser Dilation (N/A )   Anesthesia type: General   Pre-op diagnosis: Subglottic stenosis   Location: [facility] OR ROOM 09 / [facility]   Surgeons: Melida Quitter, MD      DISCUSSION: Patient is a 74 year old female scheduled for the above procedure. She is s/p awake tracheostomy, suspended microlaryngoscopy with CO2 laser dilation and application of mitomycin C on 04/02/20 for subglottic stenosis with inspiratory stridor. A #6 cuffed Shiley was placed at that time. She was changed to a cuffless tube on POD7 and began tolerating a Passy-Muir valve. At 04/16/20 visit, Dr. Redmond Baseman wrote, "She is doing well with her trach in place. I recommended repeat laryngoscopy with potential dilation in about a month. We will likely be able to decannulate her thereafter."  History includes never smoker, DM2, HTN, HLD, GERD, eyelid blepharoplasty, thyroid nodules (benign biopsy 01/12/20), subglottic stenosis (s/p tracheostomy, microlaryngoscopy with CO2 laser dilation 04/02/20), exertional dyspnea, cholecystectomy.  BMI is consistent with obesity.  H/H 9.7/30.7 which appears around her baseline ~ 10-11 range. She signed a form indicating request for: Transfuse no blood products.  Presurgical COVID-19 test is scheduled for 05/19/2020.  Anesthesia team to evaluate on the day of surgery. She wants to sign her surgical consent on the day of surgery after talking again with Dr. Redmond Baseman (reportedly to clarify timing of tracheostomy decannulation).   VS: BP (!) 135/63   Pulse 95   Temp 37.2 C (Oral)   Resp 18   Ht 5\' 3"  (1.6 m)   Wt 81.6 kg   SpO2 100%   BMI 31.89 kg/m     PROVIDERS: Rutherford Guys, MD is PCP. Next visit scheduled for 06/04/20. Noemi Chapel, DO is pulmonologist. Last visit 02/06/20.    LABS: Preoperative labs reviewed. H/H 9.7/30.7, down slightly from 10.1/31.1 on 04/25/20. Glucose 267 with A1c 7.0 on  03/29/20. Patient reported home CBGs have been higher since tracheostomy (up to 300s) with fasting ~ 90-120.  (all labs ordered are listed, but only abnormal results are displayed)  Labs Reviewed  GLUCOSE, CAPILLARY - Abnormal; Notable for the following components:      Result Value   Glucose-Capillary 233 (*)    All other components within normal limits  BASIC METABOLIC PANEL - Abnormal; Notable for the following components:   Sodium 133 (*)    Glucose, Bld 267 (*)    Creatinine, Ser 1.09 (*)    GFR calc non Af Amer 50 (*)    GFR calc Af Amer 58 (*)    All other components within normal limits  CBC - Abnormal; Notable for the following components:   RBC 3.21 (*)    Hemoglobin 9.7 (*)    HCT 30.7 (*)    All other components within normal limits  NO BLOOD PRODUCTS     IMAGES: US Thyroid 01/09/20: IMPRESSION: 1. Mild thyromegaly with bilateral nodules. None currently meets criteria for biopsy or dedicated imaging follow-up. The above is in keeping with the ACR TI-RADS recommendations - J Am Coll Radiol 2017;14:587-595. - S/p right thyroid FNA: 01/12/20: FINAL MICROSCOPIC DIAGNOSIS:  - Consistent with benign follicular nodule (Bethesda category II)   CT Soft tissue neck 01/04/20: IMPRESSION: - Bilateral linear bands of soft tissue within the proximal subglottic airway. This is of unknown clinical significance but could reflect webs and direct visual inspection is recommended. - Multinodular thyroid without significant tracheal compression.  CT Chest 01/04/20: IMPRESSION:  1. Visualized trachea is normal. 2. Thyroid nodules appear benign. 3. Coronary artery calcification and Aortic Atherosclerosis (ICD10-I70.0).   EKG: 03/29/20: NSR at 95 bpm   CV: Echo 12/14/19: IMPRESSIONS  1. Left ventricular ejection fraction, by estimation, is 60 to 65%. The  left ventricle has normal function. The left ventricle has no regional  wall motion abnormalities. Indeterminate  diastolic filling due to E-A  fusion.  2. Right ventricular systolic function is normal. The right ventricular  size is normal. Tricuspid regurgitation signal is inadequate for assessing  PA pressure.  3. The mitral valve is normal in structure and function. Trivial mitral  valve regurgitation. No evidence of mitral stenosis.  4. The aortic valve is tricuspid. Aortic valve regurgitation is not  visualized. No aortic stenosis is present.  5. The inferior vena cava is normal in size with greater than 50%  respiratory variability, suggesting right atrial pressure of 3 mmHg.  - Conclusion(s)/Recommendation(s): Normal biventricular function without  evidence of hemodynamically significant valvular heart disease.   She reported a remote history of unremarkable stress test.    Past Medical History:  Diagnosis Date  . Cataract   . Diabetes mellitus without complication ([place])   . Dyspnea    on exertion  . GERD (gastroesophageal reflux disease)   . Hyperlipidemia   . Hypertension   . Multiple thyroid nodules   . Thyroid disease     Past Surgical History:  Procedure Laterality Date  . CHOLECYSTECTOMY    . COSMETIC SURGERY Bilateral    eyelids  . EYE SURGERY    . GALLBLADDER SURGERY    . MICROLARYNGOSCOPY WITH CO2 LASER AND EXCISION OF VOCAL CORD LESION N/A 04/02/2020   Procedure: MICROLARYNGOSCOPY WITH DILATION AND CO2 LASER AND EXCISION OF VOCAL CORD LESION w/MITOMYCIN C;  Surgeon: Melida Quitter, MD;  Location: [facility];  Service: ENT;  Laterality: N/A;  . TRACHEOSTOMY TUBE PLACEMENT N/A 04/02/2020   Procedure: Awake TRACHEOSTOMY;  Surgeon: Melida Quitter, MD;  Location: [facility];  Service: ENT;  Laterality: N/A;  . TUBAL LIGATION      MEDICATIONS: . aspirin EC 81 MG tablet  . Blood Glucose Monitoring Suppl (TRUE METRIX METER) DEVI  . cholecalciferol (VITAMIN D) 25 MCG (1000 UNIT) tablet  . Dulaglutide (TRULICITY) 8.52 DP/8.2UM SOPN  . glucose blood (TRUE METRIX BLOOD GLUCOSE TEST)  test strip  . Insulin Glargine (BASAGLAR KWIKPEN) 100 UNIT/ML SOPN  . Insulin Pen Needle (PEN NEEDLES) 32G X 5 MM MISC  . losartan-hydrochlorothiazide (HYZAAR) 50-12.5 MG tablet  . omeprazole (PRILOSEC) 40 MG capsule  . pravastatin (PRAVACHOL) 40 MG tablet  . sitaGLIPtin-metformin (JANUMET) 50-1000 MG tablet  . Tetrahydrozoline HCl (VISINE OP)   No current facility-administered medications for this encounter.  She was instructed to clarify with Dr. Redmond Baseman regarding perioperative ASA 81mg  instructions.   Myra Gianotti, PA-C Surgical Short Stay/Anesthesiology [facility] Phone [phone] [facility] Phone [phone] 05/17/2020 1:45 PM

## 2020-05-17 NOTE — Anesthesia Preprocedure Evaluation (Addendum)
Anesthesia Evaluation  Patient identified by MRN, date of birth, ID band Patient awake    Reviewed: Allergy & Precautions, NPO status , Patient's Chart, lab work & pertinent test results  Airway Mallampati: II  TM Distance: >3 FB Neck ROM: Full    Dental  (+) Dental Advisory Given   Pulmonary shortness of breath,  S'p trach for subglottic stenosis   breath sounds clear to auscultation       Cardiovascular hypertension, Pt. on medications  Rhythm:Regular Rate:Normal     Neuro/Psych negative neurological ROS     GI/Hepatic Neg liver ROS, GERD  ,  Endo/Other  diabetes, Type 2, Insulin Dependent  Renal/GU negative Renal ROS     Musculoskeletal   Abdominal   Peds  Hematology  (+) anemia , REFUSES BLOOD PRODUCTS,   Anesthesia Other Findings   Reproductive/Obstetrics                           Lab Results  Component Value Date   WBC 8.3 05/16/2020   HGB 9.7 (L) 05/16/2020   HCT 30.7 (L) 05/16/2020   MCV 95.6 05/16/2020   PLT 271 05/16/2020    Anesthesia Physical Anesthesia Plan  ASA: III  Anesthesia Plan: General   Post-op Pain Management:    Induction: Intravenous  PONV Risk Score and Plan: 3 and Dexamethasone and Ondansetron  Airway Management Planned: Tracheostomy  Additional Equipment:   Intra-op Plan:   Post-operative Plan: Extubation in OR  Informed Consent: I have reviewed the patients History and Physical, chart, labs and discussed the procedure including the risks, benefits and alternatives for the proposed anesthesia with the patient or authorized representative who has indicated his/her understanding and acceptance.     Dental advisory given  Plan Discussed with: CRNA  Anesthesia Plan Comments: ( )      Anesthesia Quick Evaluation

## 2020-05-19 ENCOUNTER — Other Ambulatory Visit (HOSPITAL_COMMUNITY)
Admission: RE | Admit: 2020-05-19 | Discharge: 2020-05-19 | Disposition: A | Discharge status: Home / Self Care | Payer: Medicare HMO | Source: Ambulatory Visit | Attending: Otolaryngology | Admitting: Otolaryngology

## 2020-05-19 DIAGNOSIS — Z01812 Encounter for preprocedural laboratory examination: Secondary | ICD-10-CM | POA: Insufficient documentation

## 2020-05-19 DIAGNOSIS — Z20822 Contact with and (suspected) exposure to covid-19: Secondary | ICD-10-CM | POA: Insufficient documentation

## 2020-05-19 LAB — SARS CORONAVIRUS 2 (TAT 6-24 HRS): SARS Coronavirus 2: NEGATIVE

## 2020-05-23 ENCOUNTER — Encounter (HOSPITAL_COMMUNITY): Payer: Self-pay | Admitting: Otolaryngology

## 2020-05-23 ENCOUNTER — Other Ambulatory Visit: Payer: Self-pay

## 2020-05-23 ENCOUNTER — Ambulatory Visit (HOSPITAL_COMMUNITY)
Admission: RE | Admit: 2020-05-23 | Discharge: 2020-05-23 | Disposition: A | Discharge status: Home / Self Care | Payer: Medicare HMO | Attending: Otolaryngology | Admitting: Otolaryngology

## 2020-05-23 ENCOUNTER — Encounter (HOSPITAL_COMMUNITY)
Admission: RE | Disposition: A | Discharge status: Home / Self Care | Payer: Self-pay | Source: Home / Self Care | Attending: Otolaryngology

## 2020-05-23 ENCOUNTER — Ambulatory Visit (HOSPITAL_COMMUNITY): Payer: Medicare HMO | Admitting: Physician Assistant

## 2020-05-23 ENCOUNTER — Ambulatory Visit (HOSPITAL_COMMUNITY): Payer: Medicare HMO | Admitting: Certified Registered"

## 2020-05-23 DIAGNOSIS — Z930 Tracheostomy status: Secondary | ICD-10-CM | POA: Insufficient documentation

## 2020-05-23 DIAGNOSIS — K219 Gastro-esophageal reflux disease without esophagitis: Secondary | ICD-10-CM | POA: Insufficient documentation

## 2020-05-23 DIAGNOSIS — J386 Stenosis of larynx: Secondary | ICD-10-CM | POA: Diagnosis not present

## 2020-05-23 DIAGNOSIS — E785 Hyperlipidemia, unspecified: Secondary | ICD-10-CM | POA: Insufficient documentation

## 2020-05-23 DIAGNOSIS — Z79899 Other long term (current) drug therapy: Secondary | ICD-10-CM | POA: Diagnosis not present

## 2020-05-23 DIAGNOSIS — I10 Essential (primary) hypertension: Secondary | ICD-10-CM | POA: Insufficient documentation

## 2020-05-23 DIAGNOSIS — Z7982 Long term (current) use of aspirin: Secondary | ICD-10-CM | POA: Diagnosis not present

## 2020-05-23 DIAGNOSIS — Z794 Long term (current) use of insulin: Secondary | ICD-10-CM | POA: Insufficient documentation

## 2020-05-23 DIAGNOSIS — E119 Type 2 diabetes mellitus without complications: Secondary | ICD-10-CM | POA: Diagnosis not present

## 2020-05-23 HISTORY — PX: MICROLARYNGOSCOPY WITH DILATION: SHX5971

## 2020-05-23 LAB — GLUCOSE, CAPILLARY
Glucose-Capillary: 169 mg/dL — ABNORMAL HIGH (ref 70–99)
Glucose-Capillary: 170 mg/dL — ABNORMAL HIGH (ref 70–99)

## 2020-05-23 SURGERY — MICROLARYNGOSCOPY WITH DILATION
Anesthesia: General | Site: Mouth

## 2020-05-23 MED ORDER — FENTANYL CITRATE (PF) 100 MCG/2ML IJ SOLN
INTRAMUSCULAR | Status: DC | PRN
  Administered 2020-05-23: 50 ug via INTRAVENOUS

## 2020-05-23 MED ORDER — MIDAZOLAM HCL 5 MG/5ML IJ SOLN
INTRAMUSCULAR | Status: DC | PRN
  Administered 2020-05-23: 2 mg via INTRAVENOUS

## 2020-05-23 MED ORDER — EPINEPHRINE PF 1 MG/ML IJ SOLN
INTRAMUSCULAR | Status: DC | PRN
  Administered 2020-05-23: 1 mg

## 2020-05-23 MED ORDER — SUCCINYLCHOLINE CHLORIDE 200 MG/10ML IV SOSY
PREFILLED_SYRINGE | INTRAVENOUS | Status: AC
  Filled 2020-05-23: qty 10

## 2020-05-23 MED ORDER — ROCURONIUM BROMIDE 10 MG/ML (PF) SYRINGE
PREFILLED_SYRINGE | INTRAVENOUS | Status: AC
  Filled 2020-05-23: qty 20

## 2020-05-23 MED ORDER — LIDOCAINE 2% (20 MG/ML) 5 ML SYRINGE
INTRAMUSCULAR | Status: DC | PRN
  Administered 2020-05-23: 60 mg via INTRAVENOUS

## 2020-05-23 MED ORDER — ONDANSETRON HCL 4 MG/2ML IJ SOLN
INTRAMUSCULAR | Status: AC
  Filled 2020-05-23: qty 2

## 2020-05-23 MED ORDER — DEXAMETHASONE SODIUM PHOSPHATE 10 MG/ML IJ SOLN
INTRAMUSCULAR | Status: AC
  Filled 2020-05-23: qty 1

## 2020-05-23 MED ORDER — FENTANYL CITRATE (PF) 250 MCG/5ML IJ SOLN
INTRAMUSCULAR | Status: AC
  Filled 2020-05-23: qty 5

## 2020-05-23 MED ORDER — SUGAMMADEX SODIUM 200 MG/2ML IV SOLN
INTRAVENOUS | Status: DC | PRN
  Administered 2020-05-23: 100 mg via INTRAVENOUS
  Administered 2020-05-23: 200 mg via INTRAVENOUS

## 2020-05-23 MED ORDER — PROPOFOL 10 MG/ML IV BOLUS
INTRAVENOUS | Status: AC
  Filled 2020-05-23: qty 20

## 2020-05-23 MED ORDER — MITOMYCIN CHEMO FOR BLADDER INSTILLATION 40 MG: 40.0000 mg | Freq: Once | INTRAVENOUS | Status: DC

## 2020-05-23 MED ORDER — LIDOCAINE 2% (20 MG/ML) 5 ML SYRINGE
INTRAMUSCULAR | Status: AC
  Filled 2020-05-23: qty 5

## 2020-05-23 MED ORDER — 0.9 % SODIUM CHLORIDE (POUR BTL) OPTIME
TOPICAL | Status: DC | PRN
  Administered 2020-05-23: 1000 mL

## 2020-05-23 MED ORDER — EPINEPHRINE HCL (NASAL) 0.1 % NA SOLN
NASAL | Status: AC
  Filled 2020-05-23: qty 30

## 2020-05-23 MED ORDER — CHLORHEXIDINE GLUCONATE 0.12 % MT SOLN
15.0000 mL | Freq: Once | OROMUCOSAL | Status: AC
  Administered 2020-05-23: 15 mL via OROMUCOSAL
  Filled 2020-05-23: qty 15

## 2020-05-23 MED ORDER — MIDAZOLAM HCL 2 MG/2ML IJ SOLN
INTRAMUSCULAR | Status: AC
  Filled 2020-05-23: qty 2

## 2020-05-23 MED ORDER — LACTATED RINGERS IV SOLN: INTRAVENOUS | Status: DC

## 2020-05-23 MED ORDER — PROPOFOL 10 MG/ML IV BOLUS
INTRAVENOUS | Status: DC | PRN
  Administered 2020-05-23: 50 mg via INTRAVENOUS
  Administered 2020-05-23: 150 ug/kg/min via INTRAVENOUS
  Administered 2020-05-23: 100 mg via INTRAVENOUS

## 2020-05-23 MED ORDER — AMISULPRIDE (ANTIEMETIC) 5 MG/2ML IV SOLN: 10.0000 mg | Freq: Once | INTRAVENOUS | Status: DC | PRN

## 2020-05-23 MED ORDER — ROCURONIUM BROMIDE 10 MG/ML (PF) SYRINGE
PREFILLED_SYRINGE | INTRAVENOUS | Status: DC | PRN
  Administered 2020-05-23: 30 mg via INTRAVENOUS

## 2020-05-23 MED ORDER — ONDANSETRON HCL 4 MG/2ML IJ SOLN
INTRAMUSCULAR | Status: DC | PRN
  Administered 2020-05-23: 4 mg via INTRAVENOUS

## 2020-05-23 MED ORDER — ORAL CARE MOUTH RINSE: 15.0000 mL | Freq: Once | OROMUCOSAL | Status: AC

## 2020-05-23 MED ORDER — FENTANYL CITRATE (PF) 100 MCG/2ML IJ SOLN: 25.0000 ug | INTRAMUSCULAR | Status: DC | PRN

## 2020-05-23 MED ORDER — DEXAMETHASONE SODIUM PHOSPHATE 10 MG/ML IJ SOLN
INTRAMUSCULAR | Status: DC | PRN
  Administered 2020-05-23: 10 mg via INTRAVENOUS

## 2020-05-23 SURGICAL SUPPLY — 43 items
BALLN PULM 12 13.5 15X75 (BALLOONS)
BALLN PULM 12 13.5 15X75CM (BALLOONS)
BALLN PULM 15 16.5 18 X 75CM (BALLOONS) ×1
BALLN PULM 15 16.5 18X75 (BALLOONS) ×2
BALLN PULMONARY 10 12MM (MISCELLANEOUS)
BALLN PULMONARY 10-12 (MISCELLANEOUS) IMPLANT
BALLN PULMONARY 8 10 OD75CM (MISCELLANEOUS)
BALLN PULMONARY 8-10 OD75 (MISCELLANEOUS) IMPLANT
BALLOON PULM 12 13.5 15X75 (BALLOONS) IMPLANT
BALLOON PULM 15 16.5 18X75 (BALLOONS) IMPLANT
BNDG EYE OVAL (GAUZE/BANDAGES/DRESSINGS) ×3 IMPLANT
CANISTER SUCT 3000ML PPV (MISCELLANEOUS) ×3 IMPLANT
CNTNR URN SCR LID CUP LEK RST (MISCELLANEOUS) IMPLANT
CONT SPEC 4OZ STRL OR WHT (MISCELLANEOUS)
COVER BACK TABLE 60X90IN (DRAPES) ×3 IMPLANT
COVER WAND RF STERILE (DRAPES) ×1 IMPLANT
DRAPE HALF SHEET 40X57 (DRAPES) ×3 IMPLANT
GAUZE 4X4 16PLY RFD (DISPOSABLE) ×2 IMPLANT
GAUZE SPONGE 4X4 12PLY STRL (GAUZE/BANDAGES/DRESSINGS) ×3 IMPLANT
GLOVE BIO SURGEON STRL SZ7.5 (GLOVE) ×3 IMPLANT
GOWN STRL REUS W/ TWL LRG LVL3 (GOWN DISPOSABLE) IMPLANT
GOWN STRL REUS W/TWL LRG LVL3 (GOWN DISPOSABLE)
KIT BASIN OR (CUSTOM PROCEDURE TRAY) ×3 IMPLANT
KIT TURNOVER KIT B (KITS) ×3 IMPLANT
NDL HYPO 25GX1X1/2 BEV (NEEDLE) IMPLANT
NEEDLE HYPO 25GX1X1/2 BEV (NEEDLE) IMPLANT
NS IRRIG 1000ML POUR BTL (IV SOLUTION) ×3 IMPLANT
PAD ARMBOARD 7.5X6 YLW CONV (MISCELLANEOUS) ×6 IMPLANT
PATTIES SURGICAL .5 X1 (DISPOSABLE) IMPLANT
PATTIES SURGICAL .5 X3 (DISPOSABLE) IMPLANT
POSITIONER HEAD DONUT 9IN (MISCELLANEOUS) IMPLANT
SET COLLECT BLD 25X3/4 12 (NEEDLE) IMPLANT
SOL ANTI FOG 6CC (MISCELLANEOUS) IMPLANT
SOLUTION ANTI FOG 6CC (MISCELLANEOUS)
SURGILUBE 2OZ TUBE FLIPTOP (MISCELLANEOUS) IMPLANT
SUT SILK 2 0 SH (SUTURE) IMPLANT
SYR CONTROL 10ML LL (SYRINGE) ×3 IMPLANT
SYR GAUGE ALLIANCE SINGLE USE (MISCELLANEOUS) ×2 IMPLANT
TOWEL GREEN STERILE FF (TOWEL DISPOSABLE) ×6 IMPLANT
TUBE CONNECTING 12'X1/4 (SUCTIONS) ×1
TUBE CONNECTING 12X1/4 (SUCTIONS) ×2 IMPLANT
TUBE TRACH SHILE 4 NONFEN UNCU (TUBING) ×2 IMPLANT
WATER STERILE IRR 1000ML POUR (IV SOLUTION) ×3 IMPLANT

## 2020-05-23 NOTE — Anesthesia Postprocedure Evaluation (Signed)
Anesthesia Post Note  Patient: Bridget Butler  Procedure(s) Performed: MICROLARYNGOSCOPY W/ DILATION AND JET VENTILATION (N/A Mouth)     Patient location during evaluation: PACU Anesthesia Type: General Level of consciousness: awake and alert Pain management: pain level controlled Vital Signs Assessment: post-procedure vital signs reviewed and stable Respiratory status: spontaneous breathing, nonlabored ventilation, respiratory function stable and patient connected to nasal cannula oxygen Cardiovascular status: blood pressure returned to baseline and stable Postop Assessment: no apparent nausea or vomiting Anesthetic complications: no   No complications documented.  Last Vitals:  Vitals:   05/23/20 1018 05/23/20 1019  BP:  133/67  Pulse: 90 91  Resp: 19 15  Temp:  36.5 C  SpO2: 100% 100%    Last Pain:  Vitals:   05/23/20 1019  TempSrc:   PainSc: 0-No pain                 Tiajuana Amass

## 2020-05-23 NOTE — H&P (Signed)
Bridget Butler is an 74 y.o. female.   Chief Complaint: Subglottic stenosis HPI: 74 year old female with subglottic stenosis managed with tracheostomy and dilation.  She has been tolerating her Passy-Muir valve well and presents for repeat laryngoscopy.  Past Medical History:  Diagnosis Date  . Cataract   . Diabetes mellitus without complication ([place])   . Dyspnea    on exertion  . GERD (gastroesophageal reflux disease)   . Hyperlipidemia   . Hypertension   . Multiple thyroid nodules   . Thyroid disease     Past Surgical History:  Procedure Laterality Date  . CHOLECYSTECTOMY    . COSMETIC SURGERY Bilateral    eyelids  . EYE SURGERY    . GALLBLADDER SURGERY    . MICROLARYNGOSCOPY WITH CO2 LASER AND EXCISION OF VOCAL CORD LESION N/A 04/02/2020   Procedure: MICROLARYNGOSCOPY WITH DILATION AND CO2 LASER AND EXCISION OF VOCAL CORD LESION w/MITOMYCIN C;  Surgeon: Melida Quitter, MD;  Location: [facility];  Service: ENT;  Laterality: N/A;  . TRACHEOSTOMY TUBE PLACEMENT N/A 04/02/2020   Procedure: Awake TRACHEOSTOMY;  Surgeon: Melida Quitter, MD;  Location: [facility] OR;  Service: ENT;  Laterality: N/A;  . TUBAL LIGATION      Family History  Problem Relation Age of Onset  . Heart disease Mother   . Hypertension Mother   . Cancer Sister   . Diabetes Sister    Social History:  reports that she has never smoked. She has never used smokeless tobacco. She reports that she does not drink alcohol and does not use drugs.  Allergies: No Known Allergies  Medications Prior to Admission  Medication Sig Dispense Refill  . aspirin EC 81 MG tablet Take 81 mg by mouth daily.    . cholecalciferol (VITAMIN D) 25 MCG (1000 UNIT) tablet Take 1,000 Units by mouth at bedtime.    . Dulaglutide (TRULICITY) 4.25 ZD/6.3OV SOPN Inject 0.5 mLs into the skin once a week. (Patient taking differently: Inject 0.5 mLs into the skin once a week. Sunday) 4 pen 5  . Insulin Glargine (BASAGLAR KWIKPEN) 100 UNIT/ML SOPN Inject 0.2  mLs (20 Units total) into the skin at bedtime. (Patient taking differently: Inject 20 Units into the skin daily. ) 45 mL 3  . losartan-hydrochlorothiazide (HYZAAR) 50-12.5 MG tablet Take 1 tablet by mouth daily. 90 tablet 3  . omeprazole (PRILOSEC) 40 MG capsule Take 40 mg by mouth in the morning and at bedtime.     . pravastatin (PRAVACHOL) 40 MG tablet Take 1 tablet (40 mg total) by mouth daily. (Patient taking differently: Take 40 mg by mouth at bedtime. ) 90 tablet 3  . sitaGLIPtin-metformin (JANUMET) 50-1000 MG tablet Take 1 tablet by mouth daily. (Patient taking differently: Take 1 tablet by mouth at bedtime. ) 90 tablet 1  . Tetrahydrozoline HCl (VISINE OP) Apply 1 drop to eye daily as needed (dry eyes).    . Blood Glucose Monitoring Suppl (TRUE METRIX METER) DEVI 1 Device by Does not apply route 2 (two) times daily. 1 Device 0  . glucose blood (TRUE METRIX BLOOD GLUCOSE TEST) test strip Use as instructed 300 each 1  . Insulin Pen Needle (PEN NEEDLES) 32G X 5 MM MISC 1 Units by Does not apply route daily. 100 each 5    Results for orders placed or performed during the hospital encounter of 05/23/20 (from the past 48 hour(s))  Glucose, capillary     Status: Abnormal   Collection Time: 05/23/20  6:55 AM  Result Value Ref Range   Glucose-Capillary 169 (H) 70 - 99 mg/dL    Comment: Glucose reference range applies only to samples taken after fasting for at least 8 hours.   No results found.  Review of Systems  All other systems reviewed and are negative.   Blood pressure (!) 119/53, pulse 92, temperature 97.7 F (36.5 C), temperature source Temporal, resp. rate 18, height 5\' 3"  (1.6 m), weight 81.6 kg, SpO2 100 %. Physical Exam Constitutional:      Appearance: Normal appearance. She is normal weight.  HENT:     Head: Normocephalic and atraumatic.     Right Ear: External ear normal.     Left Ear: External ear normal.     Nose: Nose normal.     Mouth/Throat:     Mouth: Mucous  membranes are moist.     Pharynx: Oropharynx is clear.  Eyes:     Extraocular Movements: Extraocular movements intact.     Conjunctiva/sclera: Conjunctivae normal.     Pupils: Pupils are equal, round, and reactive to light.  Neck:     Comments: #6 cuffless trach tube in place, normal voice and breathing with Passy-Muir valve in place. Cardiovascular:     Rate and Rhythm: Normal rate.  Pulmonary:     Effort: Pulmonary effort is normal.  Skin:    General: Skin is warm.  Neurological:     General: No focal deficit present.     Mental Status: She is alert and oriented to person, place, and time.  Psychiatric:        Mood and Affect: Mood normal.        Behavior: Behavior normal.        Thought Content: Thought content normal.        Judgment: Judgment normal.      Assessment/Plan Subglottic stenosis  To OR for microlaryngoscopy with possible dilation.  Melida Quitter, MD 05/23/2020, 8:21 AM

## 2020-05-23 NOTE — Op Note (Signed)
PREOPERATIVE DIAGNOSIS:  Subglottic stenosis   POSTOPERATIVE DIAGNOSIS:  Subglottic stenosis   PROCEDURE:  Suspended microdirect laryngoscopy with dilation   SURGEON:  Melida Quitter, MD   ANESTHESIA:  General with jet ventilation by anesthesia.   COMPLICATIONS:  None.   INDICATIONS:  The patient is a 74 year old female with a history of subglottic stenosis that required management with dilation and tracheostomy.  She has tolerated the Passy-Muir valve easily since dilation about one month ago and presents for repeat laryngoscopy.   FINDINGS:  2 cm segment of granulated tracheal wall between subglottis and trach site causing 20-30% narrowing.  No radial incisions performed but area dilated.   DESCRIPTION OF PROCEDURE:  The patient was identified in the holding room, informed consent having been obtained including discussion of risks, benefits and alternatives, the patient was brought to the operative suite and put the operative table in  supine position.  Anesthesia was induced and the patient was intubated through the trach site using a laser safe tube without difficulty.  The eyes were taped closed and bed was turned 90 degrees from anesthesia.  A tooth guard was placed over the upper teeth and a Stortz laryngoscope was placed into the glottic position and suspended to Mayo stand using the Lewy arm.  The subglottic airway was evaluated by a zero degree telescope and photo taken.  The endotracheal tube was removed and jet ventilation was initiated.  The subglottic/proximal tracheal airway was then dilated with a large tracheal balloon inflated to 7 atm for one minute.  The balloon was removed and the airway again examined with the zero degree telescope with photo made.  The airway was suctioned and a standard endotracheal tube was placed through the trach site. The tooth guard was removed and the patient was turned back to anesthesia for wakeup.  When she was breathing spontaneously, a #4 cuffless  trach tube was placed without difficulty and she was taken to the recovery room in stable condition.

## 2020-05-23 NOTE — Transfer of Care (Signed)
Immediate Anesthesia Transfer of Care Note  Patient: Bridget Butler  Procedure(s) Performed: MICROLARYNGOSCOPY W/ DILATION AND JET VENTILATION (N/A Mouth)  Patient Location: PACU  Anesthesia Type:General  Level of Consciousness: drowsy and patient cooperative  Airway & Oxygen Therapy: Patient Spontanous Breathing and Patient connected to face mask  Post-op Assessment: Report given to RN and Post -op Vital signs reviewed and stable  Post vital signs: Reviewed and stable  Last Vitals:  Vitals Value Taken Time  BP 115/50 05/23/20 0922  Temp    Pulse 99 05/23/20 0923  Resp 20 05/23/20 0923  SpO2 100 % 05/23/20 0923  Vitals shown include unvalidated device data.  Last Pain:  Vitals:   05/23/20 0743  TempSrc:   PainSc: 0-No pain         Complications: No complications documented.

## 2020-05-23 NOTE — Brief Op Note (Signed)
05/23/2020  9:17 AM  PATIENT:  Bridget Butler  74 y.o. female  PRE-OPERATIVE DIAGNOSIS:  Subglottic stenosis  POST-OPERATIVE DIAGNOSIS:  Subglottic stenosis  PROCEDURE:  Procedure(s): MICROLARYNGOSCOPY W/ DILATION AND JET VENTILATION (N/A)  SURGEON:  Surgeon(s) and Role:    Melida Quitter, MD - Primary  PHYSICIAN ASSISTANT:   ASSISTANTS: none   ANESTHESIA:   general  EBL: Minimal  BLOOD ADMINISTERED:none  DRAINS: none   LOCAL MEDICATIONS USED:  NONE  SPECIMEN:  No Specimen  DISPOSITION OF SPECIMEN:  N/A  COUNTS:  YES  TOURNIQUET:  * No tourniquets in log *  DICTATION: .Note written in EPIC  PLAN OF CARE: Discharge to home after PACU  PATIENT DISPOSITION:  PACU - hemodynamically stable.   Delay start of Pharmacological VTE agent (>24hrs) due to surgical blood loss or risk of bleeding: no

## 2020-05-24 ENCOUNTER — Encounter (HOSPITAL_COMMUNITY): Payer: Self-pay | Admitting: Otolaryngology

## 2020-05-25 DIAGNOSIS — J386 Stenosis of larynx: Secondary | ICD-10-CM | POA: Diagnosis not present

## 2020-06-04 ENCOUNTER — Encounter: Payer: Self-pay | Admitting: Family Medicine

## 2020-06-04 ENCOUNTER — Other Ambulatory Visit: Payer: Self-pay

## 2020-06-04 ENCOUNTER — Ambulatory Visit (INDEPENDENT_AMBULATORY_CARE_PROVIDER_SITE_OTHER): Payer: Medicare HMO | Admitting: Family Medicine

## 2020-06-04 VITALS — BP 144/60 | HR 97 | Temp 98.0°F | Ht 63.0 in | Wt 184.4 lb

## 2020-06-04 DIAGNOSIS — Z794 Long term (current) use of insulin: Secondary | ICD-10-CM

## 2020-06-04 DIAGNOSIS — N184 Chronic kidney disease, stage 4 (severe): Secondary | ICD-10-CM | POA: Diagnosis not present

## 2020-06-04 DIAGNOSIS — R7989 Other specified abnormal findings of blood chemistry: Secondary | ICD-10-CM | POA: Diagnosis not present

## 2020-06-04 DIAGNOSIS — E785 Hyperlipidemia, unspecified: Secondary | ICD-10-CM

## 2020-06-04 DIAGNOSIS — E1165 Type 2 diabetes mellitus with hyperglycemia: Secondary | ICD-10-CM | POA: Diagnosis not present

## 2020-06-04 DIAGNOSIS — Z6841 Body Mass Index (BMI) 40.0 and over, adult: Secondary | ICD-10-CM | POA: Diagnosis not present

## 2020-06-04 DIAGNOSIS — I10 Essential (primary) hypertension: Secondary | ICD-10-CM

## 2020-06-04 DIAGNOSIS — IMO0002 Reserved for concepts with insufficient information to code with codable children: Secondary | ICD-10-CM

## 2020-06-04 MED ORDER — METFORMIN HCL ER 750 MG PO TB24: 750.0000 mg | ORAL_TABLET | Freq: Two times a day (BID) | ORAL | 1 refills | Status: DC

## 2020-06-04 MED ORDER — TRULICITY 1.5 MG/0.5ML ~~LOC~~ SOAJ
1.5000 mg | SUBCUTANEOUS | 5 refills | Status: DC
Start: 2020-06-04 — End: 2021-05-06

## 2020-06-04 NOTE — Progress Notes (Signed)
8/16/20212:49 PM  Bridget Butler Record [birth year], 74 y.o., female [id number]  Chief Complaint  Patient presents with  . Follow-up    recent trach removal, want to talk to pcp regarding medical cond    HPI:   Patient is a 74 y.o. female with past medical history significant for  DM2 on insulin, HTN, HLP, osteopenia, vit D deficiency, hyperthyroidism, subglottic stenosis s/p tracheawho presents today for routine followup  Last OV nov 2020 ENT Dr Redmond Baseman - wake, last OV aug 2021 - management of subglottic stenosis, trach removed  She reports taht her cbgs have been high since her surgery june She checks fasting and bedtime Today fasting 134, but it has been running ~ 190 Bedtime dose around 300-400s  She is not aware of receiving steroids during the process She wonders about seeing an endocrinologist Doing janumet, trulicity, basaglar 20 units Regular metformin causes GI issues Checks BP at home, usually 151/76H Gets her trulicity thru med assistance program  Lab Results  Component Value Date   HGBA1C 7.0 (H) 03/29/2020   HGBA1C 7.1 (H) 12/05/2019   HGBA1C 6.4 (H) 06/03/2019   Lab Results  Component Value Date   LDLCALC 100 (H) 09/02/2019   CREATININE 1.09 (H) 05/16/2020    Depression screen PHQ 2/9 04/25/2020 01/02/2020 12/05/2019  Decreased Interest 0 0 0  Down, Depressed, Hopeless 0 0 0  PHQ - 2 Score 0 0 0    Fall Risk  06/04/2020 04/25/2020 01/02/2020 12/05/2019 06/08/2019  Falls in the past year? 0 0 0 0 1  Number falls in past yr: 0 0 0 0 0  Injury with Fall? 0 0 0 0 0  Risk for fall due to : - - - - History of fall(s)  Follow up - Falls evaluation completed Falls evaluation completed;Education provided - Falls prevention discussed     No Known Allergies  Prior to Admission medications   Medication Sig Start Date End Date Taking? Authorizing Provider  aspirin EC 81 MG tablet Take 81 mg by mouth daily.   Yes [provider]  Blood Glucose Monitoring Suppl  (TRUE METRIX METER) DEVI 1 Device by Does not apply route 2 (two) times daily. 07/12/18  Yes Timmothy Euler, [place] D, PA-C  cholecalciferol (VITAMIN D) 25 MCG (1000 UNIT) tablet Take 1,000 Units by mouth at bedtime.   Yes [provider]  Dulaglutide (TRULICITY) 6.07 PX/1.0GY SOPN Inject 0.5 mLs into the skin once a week. Patient taking differently: Inject 0.5 mLs into the skin once a week. Sunday 09/29/19  Yes Rutherford Guys, MD  glucose blood (TRUE METRIX BLOOD GLUCOSE TEST) test strip Use as instructed 01/05/20  Yes Rutherford Guys, MD  Insulin Glargine (BASAGLAR KWIKPEN) 100 UNIT/ML SOPN Inject 0.2 mLs (20 Units total) into the skin at bedtime. Patient taking differently: Inject 20 Units into the skin daily.  11/16/19  Yes Rutherford Guys, MD  Insulin Pen Needle (PEN NEEDLES) 32G X 5 MM MISC 1 Units by Does not apply route daily. 07/08/18  Yes Shawnee Knapp, MD  losartan-hydrochlorothiazide (HYZAAR) 50-12.5 MG tablet Take 1 tablet by mouth daily. 12/05/19  Yes Rutherford Guys, MD  omeprazole (PRILOSEC) 40 MG capsule Take 40 mg by mouth in the morning and at bedtime.    Yes [provider]  pravastatin (PRAVACHOL) 40 MG tablet Take 1 tablet (40 mg total) by mouth daily. Patient taking differently: Take 40 mg by mouth at bedtime.  06/24/19  Yes Rutherford Guys, MD  sitaGLIPtin-metformin (JANUMET) 50-1000 MG tablet Take 1 tablet by mouth daily. Patient taking differently: Take 1 tablet by mouth at bedtime.  07/07/19  Yes Rutherford Guys, MD  Tetrahydrozoline HCl (VISINE OP) Apply 1 drop to eye daily as needed (dry eyes).   Yes [provider]    Past Medical History:  Diagnosis Date  . Cataract   . Diabetes mellitus without complication ([place])   . Dyspnea    on exertion  . GERD (gastroesophageal reflux disease)   . Hyperlipidemia   . Hypertension   . Multiple thyroid nodules   . Thyroid disease     Past Surgical History:  Procedure Laterality Date  .  CHOLECYSTECTOMY    . COSMETIC SURGERY Bilateral    eyelids  . EYE SURGERY    . GALLBLADDER SURGERY    . MICROLARYNGOSCOPY WITH CO2 LASER AND EXCISION OF VOCAL CORD LESION N/A 04/02/2020   Procedure: MICROLARYNGOSCOPY WITH DILATION AND CO2 LASER AND EXCISION OF VOCAL CORD LESION w/MITOMYCIN C;  Surgeon: Melida Quitter, MD;  Location: [facility];  Service: ENT;  Laterality: N/A;  . MICROLARYNGOSCOPY WITH DILATION N/A 05/23/2020   Procedure: MICROLARYNGOSCOPY W/ DILATION AND JET VENTILATION;  Surgeon: Melida Quitter, MD;  Location: [facility];  Service: ENT;  Laterality: N/A;  . TRACHEOSTOMY TUBE PLACEMENT N/A 04/02/2020   Procedure: Awake TRACHEOSTOMY;  Surgeon: Melida Quitter, MD;  Location: [facility];  Service: ENT;  Laterality: N/A;  . TUBAL LIGATION      Social History   Tobacco Use  . Smoking status: Never Smoker  . Smokeless tobacco: Never Used  Substance Use Topics  . Alcohol use: No    Family History  Problem Relation Age of Onset  . Heart disease Mother   . Hypertension Mother   . Cancer Sister   . Diabetes Sister     Review of Systems  Constitutional: Negative for chills and fever.  Respiratory: Negative for cough and shortness of breath.   Cardiovascular: Negative for chest pain, palpitations and leg swelling.  Gastrointestinal: Negative for abdominal pain, nausea and vomiting.  per hpi   OBJECTIVE:  Today's Vitals   06/04/20 1440  BP: (!) 144/60  Pulse: 97  Temp: 98 F (36.7 C)  SpO2: 100%  Weight: 184 lb 6.4 oz (83.6 kg)  Height: 5\' 3"  (1.6 m)   Body mass index is 32.66 kg/m.   Physical Exam Vitals and nursing note reviewed.  Constitutional:      Appearance: She is well-developed.  HENT:     Head: Normocephalic and atraumatic.     Mouth/Throat:     Pharynx: No oropharyngeal exudate.  Eyes:     General: No scleral icterus.    Extraocular Movements: Extraocular movements intact.     Conjunctiva/sclera: Conjunctivae normal.     Pupils: Pupils are equal, round,  and reactive to light.  Cardiovascular:     Rate and Rhythm: Normal rate and regular rhythm.     Heart sounds: Normal heart sounds. No murmur heard.  No friction rub. No gallop.   Pulmonary:     Effort: Pulmonary effort is normal.     Breath sounds: Normal breath sounds. No stridor. No wheezing, rhonchi or rales.  Musculoskeletal:     Cervical back: Neck supple.     Right lower leg: No edema.     Left lower leg: No edema.  Skin:    General: Skin is warm and dry.  Neurological:     Mental Status: She is alert and  oriented to person, place, and time.     Results for orders placed or performed in visit on 06/04/20 (from the past 24 hour(s))  TSH     Status: Abnormal   Collection Time: 06/04/20  3:35 PM  Result Value Ref Range   TSH 0.148 (L) 0.450 - 4.500 uIU/mL   Narrative   Performed at:  [facility] [address], [place], [place]  [id number] Lab Director: Rush Farmer MD, Phone:  [phone]  Lipid panel     Status: Abnormal   Collection Time: 06/04/20  3:35 PM  Result Value Ref Range   Cholesterol, Total 187 100 - 199 mg/dL   Triglycerides 177 (H) 0 - 149 mg/dL   HDL 67 >39 mg/dL   VLDL Cholesterol Cal 30 5 - 40 mg/dL   LDL Chol Calc (NIH) 90 0 - 99 mg/dL   Chol/HDL Ratio 2.8 0.0 - 4.4 ratio   Narrative   Performed at:  [facility] [address] [place], [place]  [id number] Lab Director: Rush Farmer MD, Phone:  [phone]  Comprehensive metabolic panel     Status: Abnormal   Collection Time: 06/04/20  3:35 PM  Result Value Ref Range   Glucose 240 (H) 65 - 99 mg/dL   BUN 13 8 - 27 mg/dL   Creatinine, Ser 0.95 0.57 - 1.00 mg/dL   GFR calc non Af Amer 59 (L) >59 mL/min/1.73   GFR calc Af Amer 68 >59 mL/min/1.73   BUN/Creatinine Ratio 14 12 - 28   Sodium 137 134 - 144 mmol/L   Potassium 3.9 3.5 - 5.2 mmol/L   Chloride 99 96 - 106 mmol/L   CO2 24 20 - 29 mmol/L   Calcium 9.6 8.7 - 10.3 mg/dL   Total Protein 6.7 6.0 - 8.5 g/dL   Albumin  3.8 3.7 - 4.7 g/dL   Globulin, Total 2.9 1.5 - 4.5 g/dL   Albumin/Globulin Ratio 1.3 1.2 - 2.2   Bilirubin Total 0.3 0.0 - 1.2 mg/dL   Alkaline Phosphatase 80 48 - 121 IU/L   AST 11 0 - 40 IU/L   ALT 10 0 - 32 IU/L   Narrative   Performed at:  [address] [place], [place]  [id number] Lab Director: Rush Farmer MD, Phone:  [phone]    No results found.   ASSESSMENT and PLAN  1. Insulin dependent type 2 diabetes mellitus, uncontrolled ([place]) Not controlled. Dc janumet, cont metformin XR, basaglar 20 units and increase truliclity to 1.5mg  weekly. Reached out to [facility] for MAP. Cont working on LFM  2. Essential hypertension Controlled. Continue current regime.   3. Hyperlipidemia LDL goal <100 Controlled. Continue current regime.  - Lipid panel - Comprehensive metabolic panel  4. Low TSH level FT4 pending. Referred to endo.   - TSH - T4, Free; Future - Amb ref endocrinology  Other orders - metFORMIN (GLUCOPHAGE-XR) 750 MG 24 hr tablet; Take 1 tablet (750 mg total) by mouth in the morning and at bedtime. - Dulaglutide (TRULICITY) 1.5 OV/7.8HY SOPN; Inject 0.5 mLs (1.5 mg total) into the skin once a week.  Return in about 6 weeks (around 07/16/2020).    Rutherford Guys, MD Primary Care at [facility], [place] [address] Ph.  [phone] Fax [phone]

## 2020-06-04 NOTE — Patient Instructions (Signed)
° ° ° °  If you have lab work done today you will be contacted with your lab results within the next 2 weeks.  If you have not heard from us then please contact us. The fastest way to get your results is to register for My Chart. ° ° °IF you received an x-ray today, you will receive an invoice from [facility]. Please contact [facility] at [phone] with questions or concerns regarding your invoice.  ° °IF you received labwork today, you will receive an invoice from [facility]. Please contact [facility] at [phone] with questions or concerns regarding your invoice.  ° °Our billing staff will not be able to assist you with questions regarding bills from these companies. ° °You will be contacted with the lab results as soon as they are available. The fastest way to get your results is to activate your My Chart account. Instructions are located on the last page of this paperwork. If you have not heard from us regarding the results in 2 weeks, please contact this office. °  ° ° ° °

## 2020-06-05 ENCOUNTER — Encounter: Payer: Self-pay | Admitting: Family Medicine

## 2020-06-05 DIAGNOSIS — J386 Stenosis of larynx: Secondary | ICD-10-CM | POA: Diagnosis not present

## 2020-06-05 LAB — LIPID PANEL
Chol/HDL Ratio: 2.8 ratio (ref 0.0–4.4)
Cholesterol, Total: 187 mg/dL (ref 100–199)
HDL: 67 mg/dL (ref >39)
LDL Chol Calc (NIH): 90 mg/dL (ref 0–99)
Triglycerides: 177 mg/dL — ABNORMAL HIGH (ref 0–149)
VLDL Cholesterol Cal: 30 mg/dL (ref 5–40)

## 2020-06-05 LAB — COMPREHENSIVE METABOLIC PANEL
ALT: 10 IU/L (ref 0–32)
AST: 11 IU/L (ref 0–40)
Albumin/Globulin Ratio: 1.3 (ref 1.2–2.2)
Albumin: 3.8 g/dL (ref 3.7–4.7)
Alkaline Phosphatase: 80 IU/L (ref 48–121)
BUN/Creatinine Ratio: 14 (ref 12–28)
BUN: 13 mg/dL (ref 8–27)
Bilirubin Total: 0.3 mg/dL (ref 0.0–1.2)
CO2: 24 mmol/L (ref 20–29)
Calcium: 9.6 mg/dL (ref 8.7–10.3)
Chloride: 99 mmol/L (ref 96–106)
Creatinine, Ser: 0.95 mg/dL (ref 0.57–1.00)
GFR calc Af Amer: 68 mL/min/{1.73_m2} (ref >59)
GFR calc non Af Amer: 59 mL/min/{1.73_m2} — ABNORMAL LOW (ref >59)
Globulin, Total: 2.9 g/dL (ref 1.5–4.5)
Glucose: 240 mg/dL — ABNORMAL HIGH (ref 65–99)
Potassium: 3.9 mmol/L (ref 3.5–5.2)
Sodium: 137 mmol/L (ref 134–144)
Total Protein: 6.7 g/dL (ref 6.0–8.5)

## 2020-06-05 LAB — TSH: TSH: 0.148 u[IU]/mL — ABNORMAL LOW (ref 0.450–4.500)

## 2020-06-08 ENCOUNTER — Other Ambulatory Visit: Payer: Self-pay | Admitting: Otolaryngology

## 2020-06-08 ENCOUNTER — Ambulatory Visit: Payer: Medicare HMO

## 2020-06-08 ENCOUNTER — Other Ambulatory Visit: Payer: Self-pay

## 2020-06-08 DIAGNOSIS — R7989 Other specified abnormal findings of blood chemistry: Secondary | ICD-10-CM | POA: Diagnosis not present

## 2020-06-09 LAB — T4, FREE: Free T4: 0.93 ng/dL (ref 0.82–1.77)

## 2020-06-11 ENCOUNTER — Other Ambulatory Visit: Payer: Self-pay

## 2020-06-11 DIAGNOSIS — IMO0002 Reserved for concepts with insufficient information to code with codable children: Secondary | ICD-10-CM

## 2020-06-26 ENCOUNTER — Other Ambulatory Visit: Payer: Self-pay | Admitting: Family Medicine

## 2020-07-10 ENCOUNTER — Other Ambulatory Visit: Payer: Self-pay | Admitting: Pharmacy Technician

## 2020-07-10 ENCOUNTER — Other Ambulatory Visit: Payer: Self-pay | Admitting: Family Medicine

## 2020-07-10 NOTE — Progress Notes (Signed)
[place], [place] [address] Phone: [phone] Fax: [phone]  [facility] [address] [place]), [place] - [address] [facility] [address] [place] ([place]) [place] [address] Phone: [phone] Fax: [phone]  [facility] [place], [place] - [address] [place] [address] Phone: [phone] Fax: [phone]      Your procedure is scheduled on September 24  Report to [facility] Main Entrance "A" at [place].M., and check in at the Admitting office.  Call this number if you have problems the morning of surgery:  [phone]  Call [phone] if you have any questions prior to your surgery date Monday-Friday 8am-4pm    Remember:  Do not eat or drink after midnight the night before your surgery    Take these medicines the morning of surgery with A SIP OF WATER  omeprazole (PRILOSEC)  Eye drops if needed  Follow your surgeon's instructions on when to stop Aspirin.  If no instructions were given by your surgeon then you will need to call the office to get those instructions.    As of today, STOP taking any Aspirin (unless otherwise instructed by your surgeon) Aleve, Naproxen, Ibuprofen, Motrin, Advil, Goody's, BC's, all herbal medications, fish oil, and all vitamins.   WHAT DO I DO ABOUT MY DIABETES MEDICATION?   [name] Kitchen Do not take oral diabetes medicines (pills) the morning of surgery.  . THE NIGHT BEFORE SURGERY, take _____10______ units of _Insulin Glargine (BASAGLAR KWIKPEN)__________insulin.       . The day of surgery, do not take other diabetes injectables, including Byetta (exenatide), Bydureon (exenatide ER), Victoza (liraglutide), or Trulicity (dulaglutide).  . If your CBG is greater than 220 mg/dL, you may take  of your sliding scale (correction) dose of insulin.   HOW TO MANAGE YOUR DIABETES BEFORE AND  AFTER SURGERY  Why is it important to control my blood sugar before and after surgery? . Improving blood sugar levels before and after surgery helps healing and can limit problems. . A way of improving blood sugar control is eating a healthy diet by: o  Eating less sugar and carbohydrates o  Increasing activity/exercise o  Talking with your doctor about reaching your blood sugar goals . High blood sugars (greater than 180 mg/dL) can raise your risk of infections and slow your recovery, so you will need to focus on controlling your diabetes during the weeks before surgery. . Make sure that the doctor who takes care of your diabetes knows about your planned surgery including the date and location.  How do I manage my blood sugar before surgery? . Check your blood sugar at least 4 times a day, starting 2 days before surgery, to make sure that the level is not too high or low. . Check your blood sugar the morning of your surgery when you wake up and every 2 hours until you get to the Short Stay unit. o If your blood sugar is less than 70 mg/dL, you will need to treat for low blood sugar: - Do not take insulin. - Treat a low blood sugar (less than 70 mg/dL) with  cup of clear juice (cranberry or apple), 4 glucose tablets, OR glucose gel. - Recheck blood sugar in 15 minutes after treatment (to make sure it is greater than 70 mg/dL). If your blood sugar is not greater than 70 mg/dL on  recheck, call [phone] for further instructions. . Report your blood sugar to the short stay nurse when you get to Short Stay.  . If you are admitted to the hospital after surgery: o Your blood sugar will be checked by the staff and you will probably be given insulin after surgery (instead of oral diabetes medicines) to make sure you have good blood sugar levels. o The goal for blood sugar control after surgery is 80-180 mg/dL.                      Do not wear jewelry, make up, or nail polish            Do not  wear lotions, powders, perfumes/colognes, or deodorant.            Do not shave 48 hours prior to surgery.  Men may shave face and neck.            Do not bring valuables to the hospital.            [facility] is not responsible for any belongings or valuables.  Do NOT Smoke (Tobacco/Vaping) or drink Alcohol 24 hours prior to your procedure If you use a CPAP at night, you may bring all equipment for your overnight stay.   Contacts, glasses, dentures or bridgework may not be worn into surgery.      For patients admitted to the hospital, discharge time will be determined by your treatment team.   Patients discharged the day of surgery will not be allowed to drive home, and someone needs to stay with them for 24 hours.    Special instructions:   [facility]- Preparing For Surgery  Before surgery, you can play an important role. Because skin is not sterile, your skin needs to be as free of germs as possible. You can reduce the number of germs on your skin by washing with CHG (chlorahexidine gluconate) Soap before surgery.  CHG is an antiseptic cleaner which kills germs and bonds with the skin to continue killing germs even after washing.    Oral Hygiene is also important to reduce your risk of infection.  Remember - BRUSH YOUR TEETH THE MORNING OF SURGERY WITH YOUR REGULAR TOOTHPASTE  Please do not use if you have an allergy to CHG or antibacterial soaps. If your skin becomes reddened/irritated stop using the CHG.  Do not shave (including legs and underarms) for at least 48 hours prior to first CHG shower. It is OK to shave your face.  Please follow these instructions carefully.   1. Shower the NIGHT BEFORE SURGERY and the MORNING OF SURGERY with CHG Soap.   2. If you chose to wash your hair, wash your hair first as usual with your normal shampoo.  3. After you shampoo, rinse your hair and body thoroughly to remove the shampoo.  4. Use CHG as you would any other liquid soap. You can  apply CHG directly to the skin and wash gently with a scrungie or a clean washcloth.   5. Apply the CHG Soap to your body ONLY FROM THE NECK DOWN.  Do not use on open wounds or open sores. Avoid contact with your eyes, ears, mouth and genitals (private parts). Wash Face and genitals (private parts)  with your normal soap.   6. Wash thoroughly, paying special attention to the area where your surgery will be performed.  7. Thoroughly rinse your body with warm water from the neck down.  8. DO NOT shower/wash with your normal soap after using and rinsing off the CHG Soap.  9. Pat yourself dry with a CLEAN TOWEL.  10. Wear CLEAN PAJAMAS to bed the night before surgery  11. Place CLEAN SHEETS on your bed the night of your first shower and DO NOT SLEEP WITH PETS.   Day of Surgery: Wear Clean/Comfortable clothing the morning of surgery Do not apply any deodorants/lotions.   Remember to brush your teeth WITH YOUR REGULAR TOOTHPASTE.   Please read over the following fact sheets that you were given.

## 2020-07-10 NOTE — Patient Outreach (Signed)
[facility]) Care Management  07/10/2020  Emberleigh Reily Roylance 1946-09-09 [id number]   Received message from CM that patient was requesting a call back.  Successful call placed to patient, HIPAA identifiers verified.  Patient informs she was having trouble ordering her Janumet.  Informed patient per 06/04/2020 Epic documentation from provider Grant Fontana, patient was to ". Dc janumet, cont metformin XR, basaglar 20 units and increase truliclity to 1.5mg  weekly."  Patient informs she has still been taking the Janumet, she is not taking the metformin and she is still taking the lower dose of the Trulicity. She did not increase it to the 1.5mg .  Informed patient she should reach out to her provider's office to clarify her medication regime.   Patient informs she also has an appointment with an endocrinologist on 07/18/2020. She informs that if Dr. Pamella Pert wants her to continue the Janumet until her appointment with endocrinology then she has enough to do so.  Will outreach provider with an in basket note detailing the above as well.  Called patient back and she informed she left a message with Dr. Ardyth Gal office and is awaiting a call back.  Teren Franckowiak P. Alika Eppes, [place]  [phone]

## 2020-07-11 ENCOUNTER — Encounter (HOSPITAL_COMMUNITY): Payer: Self-pay

## 2020-07-11 ENCOUNTER — Other Ambulatory Visit (HOSPITAL_COMMUNITY)
Admission: RE | Admit: 2020-07-11 | Discharge: 2020-07-11 | Disposition: A | Discharge status: Home / Self Care | Payer: Medicare HMO | Source: Ambulatory Visit | Attending: Otolaryngology | Admitting: Otolaryngology

## 2020-07-11 ENCOUNTER — Other Ambulatory Visit: Payer: Self-pay

## 2020-07-11 ENCOUNTER — Encounter (HOSPITAL_COMMUNITY)
Admission: RE | Admit: 2020-07-11 | Discharge: 2020-07-11 | Disposition: A | Discharge status: Home / Self Care | Payer: Medicare HMO | Source: Ambulatory Visit | Attending: Otolaryngology | Admitting: Otolaryngology

## 2020-07-11 DIAGNOSIS — K219 Gastro-esophageal reflux disease without esophagitis: Secondary | ICD-10-CM | POA: Diagnosis not present

## 2020-07-11 DIAGNOSIS — E785 Hyperlipidemia, unspecified: Secondary | ICD-10-CM | POA: Diagnosis not present

## 2020-07-11 DIAGNOSIS — Z794 Long term (current) use of insulin: Secondary | ICD-10-CM | POA: Insufficient documentation

## 2020-07-11 DIAGNOSIS — J386 Stenosis of larynx: Secondary | ICD-10-CM | POA: Insufficient documentation

## 2020-07-11 DIAGNOSIS — Z79899 Other long term (current) drug therapy: Secondary | ICD-10-CM | POA: Insufficient documentation

## 2020-07-11 DIAGNOSIS — E669 Obesity, unspecified: Secondary | ICD-10-CM | POA: Diagnosis not present

## 2020-07-11 DIAGNOSIS — Z20822 Contact with and (suspected) exposure to covid-19: Secondary | ICD-10-CM | POA: Insufficient documentation

## 2020-07-11 DIAGNOSIS — Z7982 Long term (current) use of aspirin: Secondary | ICD-10-CM | POA: Diagnosis not present

## 2020-07-11 DIAGNOSIS — E119 Type 2 diabetes mellitus without complications: Secondary | ICD-10-CM | POA: Diagnosis not present

## 2020-07-11 DIAGNOSIS — Z6831 Body mass index (BMI) 31.0-31.9, adult: Secondary | ICD-10-CM | POA: Diagnosis not present

## 2020-07-11 DIAGNOSIS — Z01812 Encounter for preprocedural laboratory examination: Secondary | ICD-10-CM | POA: Insufficient documentation

## 2020-07-11 DIAGNOSIS — I10 Essential (primary) hypertension: Secondary | ICD-10-CM | POA: Diagnosis not present

## 2020-07-11 LAB — BASIC METABOLIC PANEL
Anion gap: 9 (ref 5–15)
BUN: 22 mg/dL (ref 8–23)
CO2: 27 mmol/L (ref 22–32)
Calcium: 9.6 mg/dL (ref 8.9–10.3)
Chloride: 98 mmol/L (ref 98–111)
Creatinine, Ser: 1.3 mg/dL — ABNORMAL HIGH (ref 0.44–1.00)
GFR calc Af Amer: 47 mL/min — ABNORMAL LOW (ref >60)
GFR calc non Af Amer: 40 mL/min — ABNORMAL LOW (ref >60)
Glucose, Bld: 305 mg/dL — ABNORMAL HIGH (ref 70–99)
Potassium: 4.1 mmol/L (ref 3.5–5.1)
Sodium: 134 mmol/L — ABNORMAL LOW (ref 135–145)

## 2020-07-11 LAB — CBC
HCT: 31.8 % — ABNORMAL LOW (ref 36.0–46.0)
Hemoglobin: 10 g/dL — ABNORMAL LOW (ref 12.0–15.0)
MCH: 31.3 pg (ref 26.0–34.0)
MCHC: 31.4 g/dL (ref 30.0–36.0)
MCV: 99.4 fL (ref 80.0–100.0)
Platelets: 289 10*3/uL (ref 150–400)
RBC: 3.2 MIL/uL — ABNORMAL LOW (ref 3.87–5.11)
RDW: 13.6 % (ref 11.5–15.5)
WBC: 7.5 10*3/uL (ref 4.0–10.5)
nRBC: 0 % (ref 0.0–0.2)

## 2020-07-11 LAB — HEMOGLOBIN A1C
Hgb A1c MFr Bld: 10.4 % — ABNORMAL HIGH (ref 4.8–5.6)
Mean Plasma Glucose: 251.78 mg/dL

## 2020-07-11 LAB — SARS CORONAVIRUS 2 (TAT 6-24 HRS): SARS Coronavirus 2: NEGATIVE

## 2020-07-11 LAB — GLUCOSE, CAPILLARY: Glucose-Capillary: 305 mg/dL — ABNORMAL HIGH (ref 70–99)

## 2020-07-11 NOTE — Progress Notes (Addendum)
PCP - Dr. Grant Fontana in [place] Cardiologist - Denies  Chest x-ray - 12/05/19 EKG - 04/08/20 Stress Test - Denies ECHO - 12/14/19 Cardiac Cath - Denies  Sleep Study - Denies  DM - Type II Fasting Blood Sugar - 200-225 Checks Blood Sugar ___1__ times a day CBG 305 @ PAT appt - ate lunch at 12pm  Aspirin Instructions: Will call Dr. Redmond Baseman office for instruction on the Aspirin and when to stop.   COVID TEST- 07/11/20   Anesthesia review: Spoke to [place] regarding the blood sugar.  Instructed the patient to reach out to her PCP regarding her high blood sugars.  She stated she was going to start seeing an endocrinologist soon.  A1C 10.4  Patient denies shortness of breath, fever, cough and chest pain at PAT appointment   All instructions explained to the patient, with a verbal understanding of the material. Patient agrees to go over the instructions while at home for a better understanding. Patient also instructed to self quarantine after being tested for COVID-19. The opportunity to ask questions was provided.

## 2020-07-12 NOTE — Anesthesia Preprocedure Evaluation (Addendum)
Anesthesia Evaluation  Patient identified by MRN, date of birth, ID band Patient awake    Reviewed: Allergy & Precautions, NPO status , Patient's Chart, lab work & pertinent test results  Airway Mallampati: II  TM Distance: >3 FB Neck ROM: Full    Dental no notable dental hx.    Pulmonary neg pulmonary ROS,    Pulmonary exam normal breath sounds clear to auscultation       Cardiovascular hypertension, Pt. on medications negative cardio ROS Normal cardiovascular exam Rhythm:Regular Rate:Normal     Neuro/Psych negative neurological ROS  negative psych ROS   GI/Hepatic Neg liver ROS, GERD  ,  Endo/Other  negative endocrine ROSdiabetes, Type 2  Renal/GU negative Renal ROS  negative genitourinary   Musculoskeletal negative musculoskeletal ROS (+)   Abdominal (+) + obese,   Peds negative pediatric ROS (+)  Hematology negative hematology ROS (+)   Anesthesia Other Findings   Reproductive/Obstetrics negative OB ROS                            Anesthesia Physical Anesthesia Plan  ASA: III  Anesthesia Plan: General   Post-op Pain Management:    Induction: Intravenous  PONV Risk Score and Plan: 3 and Ondansetron, Dexamethasone, Midazolam and Treatment may vary due to age or medical condition  Airway Management Planned: Oral ETT  Additional Equipment:   Intra-op Plan:   Post-operative Plan: Extubation in OR  Informed Consent: I have reviewed the patients History and Physical, chart, labs and discussed the procedure including the risks, benefits and alternatives for the proposed anesthesia with the patient or authorized representative who has indicated his/her understanding and acceptance.     Dental advisory given  Plan Discussed with: CRNA  Anesthesia Plan Comments: (PAT note written 07/12/2020 by Myra Gianotti, PA-C.  Trach decannulated May 25, 2020. )      Anesthesia  Quick Evaluation

## 2020-07-12 NOTE — Progress Notes (Signed)
Anesthesia Chart Review:  Case: [id number] Date/Time: 07/13/20 1045   Procedure: MICROLARYNGOSCOPY WITH DILATION CO2 LASER (N/A )   Anesthesia type: General   Pre-op diagnosis: SUBGLOTTIC STENOSIS   Location: [place] OR ROOM 08 / [place] [facility]   Surgeons: Melida Quitter, MD      DISCUSSION: Patient is a 74 year old female scheduled for the above procedure. She is s/p awake tracheostomy, suspended microlaryngoscopy with CO2 laser dilation and application of mitomycin C on 04/02/20 for subglottic stenosis with inspiratory stridor. A #6 cuffed Shiley was placed at that time. She was changed to a cuffless tube on POD7 and began tolerating a Passy-Muir valve. She underwent suspended microdirect laryngoscopy with dilation on 05/23/20 for subglottic stenosis. Lurline Idol was decannulated on 05/25/20 with plans for repeat micro direct laryngoscopy in 2 months.   History includes never smoker, DM2, HTN, HLD, GERD, eyelid blepharoplasty, thyroid nodules(benign biopsy 01/12/20),subglottic stenosis (s/p tracheostomy, microlaryngoscopy with CO2 laser dilation 04/02/20; laryngoscopy with dilation 05/23/20; trach decannulation 05/25/20), exertional dyspnea, cholecystectomy.BMI is consistent with obesity.  H/H 10.0/31.8 which appears around her baseline ~ 10-11 range. She signed a form indicating request for: Transfuse no blood or blood products.  A1c on 07/12/20 was elevated at 10.4%, up from 7.0% 3 months ago. At her 06/04/20 primary care visit, DM regimen was adjusted due to uncontrolled DM: Janumet discontinued, continue metformin XR and Basaglar 20 units, increased Trulicity to 1.5 mg weekly. Currently, she reported fasting CBGs ~ 200-225. (Non-fasting glucose 305 at PAT, about 1 hour after lunch.) She was referred to endocrinology in August for low TSH of 0.148 with normal Free T4 of 0.93. I left a voice message with Angie, Dr. Redmond Baseman' assistant regarding A1c results.   Presurgical COVID-19 test negative on 07/11/20. She will get a fasting  CBG on arrival. Anesthesia team to evaluate on the day of surgery. Trach decannulated 05/25/20.    VS: BP 134/61   Pulse 92   Temp 36.8 C   Resp 18   Ht 5\' 3"  (1.6 m)   Wt 81.6 kg   SpO2 100%   BMI 31.85 kg/m     PROVIDERS: Rutherford Guys, MDis PCP. Last visit 06/04/20. Noemi Chapel, DO is pulmonologist. Last visit 02/06/20.   LABS: Preoperative labs noted. See DISCUSSION. (all labs ordered are listed, but only abnormal results are displayed)  Labs Reviewed  GLUCOSE, CAPILLARY - Abnormal; Notable for the following components:      Result Value   Glucose-Capillary 305 (*)    All other components within normal limits  CBC - Abnormal; Notable for the following components:   RBC 3.20 (*)    Hemoglobin 10.0 (*)    HCT 31.8 (*)    All other components within normal limits  HEMOGLOBIN A1C - Abnormal; Notable for the following components:   Hgb A1c MFr Bld 10.4 (*)    All other components within normal limits  BASIC METABOLIC PANEL - Abnormal; Notable for the following components:   Sodium 134 (*)    Glucose, Bld 305 (*)    Creatinine, Ser 1.30 (*)    GFR calc non Af Amer 40 (*)    GFR calc Af Amer 47 (*)    All other components within normal limits     IMAGES: US Thyroid 01/09/20: IMPRESSION: 1. Mild thyromegaly with bilateral nodules. None currently meets criteria for biopsy or dedicated imaging follow-up. The above is in keeping with the ACR TI-RADS recommendations - J Am Coll Radiol 2017;14:587-595. - S/p right thyroid FNA:  01/12/20: FINAL MICROSCOPIC DIAGNOSIS:  - Consistent with benign follicular nodule (Bethesda category II)   CT Soft tissue neck 01/04/20: IMPRESSION: -Bilateral linear bands of soft tissue within the proximal subglottic airway. This is of unknown clinical significance but could reflect webs and direct visual inspection is recommended. - Multinodular thyroid without significant tracheal compression.  CT Chest 01/04/20: IMPRESSION: 1.  Visualized trachea is normal. 2. Thyroid nodules appear benign. 3. Coronary artery calcification and Aortic Atherosclerosis (ICD10-I70.0).   EKG: 03/29/20: NSR at 95 bpm   CV: Echo 12/14/19: IMPRESSIONS  1. Left ventricular ejection fraction, by estimation, is 60 to 65%. The  left ventricle has normal function. The left ventricle has no regional  wall motion abnormalities. Indeterminate diastolic filling due to E-A  fusion.  2. Right ventricular systolic function is normal. The right ventricular  size is normal. Tricuspid regurgitation signal is inadequate for assessing  PA pressure.  3. The mitral valve is normal in structure and function. Trivial mitral  valve regurgitation. No evidence of mitral stenosis.  4. The aortic valve is tricuspid. Aortic valve regurgitation is not  visualized. No aortic stenosis is present.  5. The inferior vena cava is normal in size with greater than 50%  respiratory variability, suggesting right atrial pressure of 3 mmHg.  -Conclusion(s)/Recommendation(s): Normal biventricular function without  evidence of hemodynamically significant valvular heart disease.   She reported a remote history of unremarkable stress test.   Past Medical History:  Diagnosis Date  . Cataract   . Diabetes mellitus without complication ([place])   . Dyspnea    on exertion  . GERD (gastroesophageal reflux disease)   . Hyperlipidemia   . Hypertension   . Multiple thyroid nodules   . Thyroid disease     Past Surgical History:  Procedure Laterality Date  . CHOLECYSTECTOMY    . COSMETIC SURGERY Bilateral    eyelids  . EYE SURGERY    . GALLBLADDER SURGERY    . MICROLARYNGOSCOPY WITH CO2 LASER AND EXCISION OF VOCAL CORD LESION N/A 04/02/2020   Procedure: MICROLARYNGOSCOPY WITH DILATION AND CO2 LASER AND EXCISION OF VOCAL CORD LESION w/MITOMYCIN C;  Surgeon: Melida Quitter, MD;  Location: [place];  Service: ENT;  Laterality: N/A;  . MICROLARYNGOSCOPY WITH DILATION  N/A 05/23/2020   Procedure: MICROLARYNGOSCOPY W/ DILATION AND JET VENTILATION;  Surgeon: Melida Quitter, MD;  Location: [place];  Service: ENT;  Laterality: N/A;  . TRACHEOSTOMY TUBE PLACEMENT N/A 04/02/2020   Procedure: Awake TRACHEOSTOMY;  Surgeon: Melida Quitter, MD;  Location: [place];  Service: ENT;  Laterality: N/A;  . TUBAL LIGATION      MEDICATIONS: . aspirin EC 81 MG tablet  . Blood Glucose Monitoring Suppl (TRUE METRIX METER) DEVI  . cholecalciferol (VITAMIN D) 25 MCG (1000 UNIT) tablet  . Dulaglutide (TRULICITY) 1.5 HU/7.6LY SOPN  . glucose blood (TRUE METRIX BLOOD GLUCOSE TEST) test strip  . Insulin Glargine (BASAGLAR KWIKPEN) 100 UNIT/ML SOPN  . Insulin Pen Needle (PEN NEEDLES) 32G X 5 MM MISC  . losartan-hydrochlorothiazide (HYZAAR) 50-12.5 MG tablet  . metFORMIN (GLUCOPHAGE-XR) 750 MG 24 hr tablet  . omeprazole (PRILOSEC) 40 MG capsule  . pravastatin (PRAVACHOL) 40 MG tablet  . Tetrahydrozoline HCl (VISINE OP)   No current facility-administered medications for this encounter.   Advised to clarify perioperative ASA instructions with Dr. Redmond Baseman.   Myra Gianotti, PA-C Surgical Short Stay/Anesthesiology [facility] Phone [phone] [facility] Phone [phone] 07/12/2020 10:09 AM

## 2020-07-13 ENCOUNTER — Ambulatory Visit (HOSPITAL_COMMUNITY)
Admission: RE | Admit: 2020-07-13 | Discharge: 2020-07-13 | Disposition: A | Discharge status: Home / Self Care | Payer: Medicare HMO | Attending: Otolaryngology | Admitting: Otolaryngology

## 2020-07-13 ENCOUNTER — Ambulatory Visit (HOSPITAL_COMMUNITY): Payer: Medicare HMO | Admitting: Certified Registered"

## 2020-07-13 ENCOUNTER — Encounter (HOSPITAL_COMMUNITY)
Admission: RE | Disposition: A | Discharge status: Home / Self Care | Payer: Self-pay | Source: Home / Self Care | Attending: Otolaryngology

## 2020-07-13 ENCOUNTER — Encounter (HOSPITAL_COMMUNITY): Payer: Self-pay | Admitting: Otolaryngology

## 2020-07-13 ENCOUNTER — Ambulatory Visit (HOSPITAL_COMMUNITY): Payer: Medicare HMO | Admitting: Physician Assistant

## 2020-07-13 DIAGNOSIS — I10 Essential (primary) hypertension: Secondary | ICD-10-CM | POA: Insufficient documentation

## 2020-07-13 DIAGNOSIS — Z79899 Other long term (current) drug therapy: Secondary | ICD-10-CM | POA: Diagnosis not present

## 2020-07-13 DIAGNOSIS — J386 Stenosis of larynx: Secondary | ICD-10-CM | POA: Insufficient documentation

## 2020-07-13 DIAGNOSIS — E785 Hyperlipidemia, unspecified: Secondary | ICD-10-CM | POA: Diagnosis not present

## 2020-07-13 DIAGNOSIS — Z7982 Long term (current) use of aspirin: Secondary | ICD-10-CM | POA: Insufficient documentation

## 2020-07-13 DIAGNOSIS — E119 Type 2 diabetes mellitus without complications: Secondary | ICD-10-CM | POA: Diagnosis not present

## 2020-07-13 DIAGNOSIS — Z794 Long term (current) use of insulin: Secondary | ICD-10-CM | POA: Insufficient documentation

## 2020-07-13 DIAGNOSIS — K219 Gastro-esophageal reflux disease without esophagitis: Secondary | ICD-10-CM | POA: Diagnosis not present

## 2020-07-13 HISTORY — PX: MICROLARYNGOSCOPY WITH DILATION: SHX5971

## 2020-07-13 LAB — GLUCOSE, CAPILLARY
Glucose-Capillary: 210 mg/dL — ABNORMAL HIGH (ref 70–99)
Glucose-Capillary: 196 mg/dL — ABNORMAL HIGH (ref 70–99)

## 2020-07-13 SURGERY — MICROLARYNGOSCOPY WITH DILATION
Anesthesia: General | Site: Mouth

## 2020-07-13 MED ORDER — ROCURONIUM BROMIDE 100 MG/10ML IV SOLN
INTRAVENOUS | Status: DC | PRN
  Administered 2020-07-13: 80 mg via INTRAVENOUS

## 2020-07-13 MED ORDER — LIDOCAINE 2% (20 MG/ML) 5 ML SYRINGE
INTRAMUSCULAR | Status: AC
  Filled 2020-07-13: qty 5

## 2020-07-13 MED ORDER — CHLORHEXIDINE GLUCONATE 0.12 % MT SOLN: 15.0000 mL | Freq: Once | OROMUCOSAL | Status: AC

## 2020-07-13 MED ORDER — 0.9 % SODIUM CHLORIDE (POUR BTL) OPTIME
TOPICAL | Status: DC | PRN
  Administered 2020-07-13: 1000 mL

## 2020-07-13 MED ORDER — MIDAZOLAM HCL 2 MG/2ML IJ SOLN
INTRAMUSCULAR | Status: AC
  Filled 2020-07-13: qty 2

## 2020-07-13 MED ORDER — MIDAZOLAM HCL 5 MG/5ML IJ SOLN
INTRAMUSCULAR | Status: DC | PRN
  Administered 2020-07-13 (×2): 1 mg via INTRAVENOUS

## 2020-07-13 MED ORDER — PROMETHAZINE HCL 25 MG/ML IJ SOLN: 6.2500 mg | INTRAMUSCULAR | Status: DC | PRN

## 2020-07-13 MED ORDER — SUGAMMADEX SODIUM 200 MG/2ML IV SOLN
INTRAVENOUS | Status: DC | PRN
  Administered 2020-07-13: 1200 mg via INTRAVENOUS

## 2020-07-13 MED ORDER — PROPOFOL 10 MG/ML IV BOLUS
INTRAVENOUS | Status: DC | PRN
  Administered 2020-07-13: 150 mg via INTRAVENOUS

## 2020-07-13 MED ORDER — LACTATED RINGERS IV SOLN: INTRAVENOUS | Status: DC

## 2020-07-13 MED ORDER — CHLORHEXIDINE GLUCONATE 0.12 % MT SOLN
OROMUCOSAL | Status: AC
  Administered 2020-07-13: 15 mL via OROMUCOSAL
  Filled 2020-07-13: qty 15

## 2020-07-13 MED ORDER — ORAL CARE MOUTH RINSE: 15.0000 mL | Freq: Once | OROMUCOSAL | Status: AC

## 2020-07-13 MED ORDER — OXYCODONE HCL 5 MG/5ML PO SOLN: 5.0000 mg | Freq: Once | ORAL | Status: DC | PRN

## 2020-07-13 MED ORDER — ESMOLOL HCL 100 MG/10ML IV SOLN
INTRAVENOUS | Status: DC | PRN
  Administered 2020-07-13: 20 mg via INTRAVENOUS

## 2020-07-13 MED ORDER — AMISULPRIDE (ANTIEMETIC) 5 MG/2ML IV SOLN: 10.0000 mg | Freq: Once | INTRAVENOUS | Status: DC | PRN

## 2020-07-13 MED ORDER — OXYCODONE HCL 5 MG PO TABS: 5.0000 mg | ORAL_TABLET | Freq: Once | ORAL | Status: DC | PRN

## 2020-07-13 MED ORDER — PHENYLEPHRINE 40 MCG/ML (10ML) SYRINGE FOR IV PUSH (FOR BLOOD PRESSURE SUPPORT)
PREFILLED_SYRINGE | INTRAVENOUS | Status: DC | PRN
  Administered 2020-07-13 (×3): 80 ug via INTRAVENOUS

## 2020-07-13 MED ORDER — LIDOCAINE 2% (20 MG/ML) 5 ML SYRINGE
INTRAMUSCULAR | Status: DC | PRN
  Administered 2020-07-13: 80 mg via INTRAVENOUS

## 2020-07-13 MED ORDER — PROPOFOL 500 MG/50ML IV EMUL
INTRAVENOUS | Status: DC | PRN
  Administered 2020-07-13: 75 ug/kg/min via INTRAVENOUS

## 2020-07-13 MED ORDER — SODIUM CHLORIDE 0.9 % IV SOLN
0.0125 ug/kg/min | INTRAVENOUS | Status: AC
  Administered 2020-07-13: .05 ug/kg/min via INTRAVENOUS
  Filled 2020-07-13: qty 2000

## 2020-07-13 MED ORDER — HYDROMORPHONE HCL 1 MG/ML IJ SOLN
INTRAMUSCULAR | Status: AC
  Filled 2020-07-13: qty 1

## 2020-07-13 MED ORDER — EPINEPHRINE HCL (NASAL) 0.1 % NA SOLN
NASAL | Status: AC
  Filled 2020-07-13: qty 30

## 2020-07-13 MED ORDER — HYDROMORPHONE HCL 1 MG/ML IJ SOLN
0.2500 mg | INTRAMUSCULAR | Status: DC | PRN
  Administered 2020-07-13: 0.5 mg via INTRAVENOUS

## 2020-07-13 MED ORDER — EPINEPHRINE PF 1 MG/ML IJ SOLN
INTRAMUSCULAR | Status: AC
  Filled 2020-07-13: qty 1

## 2020-07-13 SURGICAL SUPPLY — 41 items
BALLN PULM 12 13.5 15X75 (BALLOONS)
BALLN PULM 12 13.5 15X75CM (BALLOONS)
BALLN PULM 15 16.5 18 X 75CM (BALLOONS)
BALLN PULM 15 16.5 18X75 (BALLOONS)
BALLN PULMONARY 10 12MM (MISCELLANEOUS)
BALLN PULMONARY 10-12 (MISCELLANEOUS) IMPLANT
BALLN PULMONARY 8 10 OD75CM (MISCELLANEOUS)
BALLN PULMONARY 8-10 OD75 (MISCELLANEOUS) IMPLANT
BALLOON PULM 12 13.5 15X75 (BALLOONS) IMPLANT
BALLOON PULM 15 16.5 18X75 (BALLOONS) IMPLANT
BNDG EYE OVAL (GAUZE/BANDAGES/DRESSINGS) ×3 IMPLANT
CANISTER SUCT 3000ML PPV (MISCELLANEOUS) ×3 IMPLANT
CNTNR URN SCR LID CUP LEK RST (MISCELLANEOUS) IMPLANT
CONT SPEC 4OZ STRL OR WHT (MISCELLANEOUS)
COVER BACK TABLE 60X90IN (DRAPES) ×3 IMPLANT
COVER WAND RF STERILE (DRAPES) ×3 IMPLANT
DRAPE HALF SHEET 40X57 (DRAPES) ×3 IMPLANT
GAUZE SPONGE 4X4 12PLY STRL (GAUZE/BANDAGES/DRESSINGS) ×3 IMPLANT
GLOVE BIO SURGEON STRL SZ7.5 (GLOVE) ×3 IMPLANT
GOWN STRL REUS W/ TWL LRG LVL3 (GOWN DISPOSABLE) IMPLANT
GOWN STRL REUS W/TWL LRG LVL3 (GOWN DISPOSABLE)
KIT BASIN OR (CUSTOM PROCEDURE TRAY) ×3 IMPLANT
KIT TURNOVER KIT B (KITS) ×3 IMPLANT
NDL HYPO 25GX1X1/2 BEV (NEEDLE) IMPLANT
NEEDLE HYPO 25GX1X1/2 BEV (NEEDLE) IMPLANT
NS IRRIG 1000ML POUR BTL (IV SOLUTION) ×3 IMPLANT
PAD ARMBOARD 7.5X6 YLW CONV (MISCELLANEOUS) ×6 IMPLANT
PATTIES SURGICAL .5 X1 (DISPOSABLE) IMPLANT
PATTIES SURGICAL .5 X3 (DISPOSABLE) IMPLANT
POSITIONER HEAD DONUT 9IN (MISCELLANEOUS) IMPLANT
SET COLLECT BLD 25X3/4 12 (NEEDLE) IMPLANT
SOL ANTI FOG 6CC (MISCELLANEOUS) IMPLANT
SOLUTION ANTI FOG 6CC (MISCELLANEOUS)
SURGILUBE 2OZ TUBE FLIPTOP (MISCELLANEOUS) IMPLANT
SUT SILK 2 0 SH (SUTURE) IMPLANT
SYR CONTROL 10ML LL (SYRINGE) ×3 IMPLANT
SYR GAUGE ALLIANCE SINGLE USE (MISCELLANEOUS) IMPLANT
TOWEL GREEN STERILE FF (TOWEL DISPOSABLE) ×6 IMPLANT
TUBE CONNECTING 12'X1/4 (SUCTIONS) ×1
TUBE CONNECTING 12X1/4 (SUCTIONS) ×2 IMPLANT
WATER STERILE IRR 1000ML POUR (IV SOLUTION) ×3 IMPLANT

## 2020-07-13 NOTE — Brief Op Note (Signed)
07/13/2020  11:35 AM  PATIENT:  Bridget Butler  74 y.o. female  PRE-OPERATIVE DIAGNOSIS:  SUBGLOTTIC STENOSIS  POST-OPERATIVE DIAGNOSIS:  SUBGLOTTIC STENOSIS  PROCEDURE:  Procedure(s): MICROLARYNGOSCOPY with Jet Ventilation (N/A)  SURGEON:  Surgeon(s) and Role:    Melida Quitter, MD - Primary  PHYSICIAN ASSISTANT:   ASSISTANTS: none   ANESTHESIA:   general  EBL: None  BLOOD ADMINISTERED:none  DRAINS: none   LOCAL MEDICATIONS USED:  NONE  SPECIMEN:  No Specimen  DISPOSITION OF SPECIMEN:  N/A  COUNTS:  YES  TOURNIQUET:  * No tourniquets in log *  DICTATION: .Note written in EPIC  PLAN OF CARE: Discharge to home after PACU  PATIENT DISPOSITION:  PACU - hemodynamically stable.   Delay start of Pharmacological VTE agent (>24hrs) due to surgical blood loss or risk of bleeding: no

## 2020-07-13 NOTE — H&P (Signed)
Bridget Butler is an 74 y.o. female.   Chief Complaint: subglottic stenosis HPI: 74 year old female with subglottic stenosis managed with tracheostomy and dilation.  She has been decannulated and has done well.  She presents for interval airway evaluation and possible dilation.  Past Medical History:  Diagnosis Date  . Cataract   . Diabetes mellitus without complication ([place])   . Dyspnea    on exertion  . GERD (gastroesophageal reflux disease)   . Hyperlipidemia   . Hypertension   . Multiple thyroid nodules   . Thyroid disease     Past Surgical History:  Procedure Laterality Date  . CHOLECYSTECTOMY    . COSMETIC SURGERY Bilateral    eyelids  . EYE SURGERY    . GALLBLADDER SURGERY    . MICROLARYNGOSCOPY WITH CO2 LASER AND EXCISION OF VOCAL CORD LESION N/A 04/02/2020   Procedure: MICROLARYNGOSCOPY WITH DILATION AND CO2 LASER AND EXCISION OF VOCAL CORD LESION w/MITOMYCIN C;  Surgeon: Melida Quitter, MD;  Location: [place];  Service: ENT;  Laterality: N/A;  . MICROLARYNGOSCOPY WITH DILATION N/A 05/23/2020   Procedure: MICROLARYNGOSCOPY W/ DILATION AND JET VENTILATION;  Surgeon: Melida Quitter, MD;  Location: [place];  Service: ENT;  Laterality: N/A;  . TRACHEOSTOMY TUBE PLACEMENT N/A 04/02/2020   Procedure: Awake TRACHEOSTOMY;  Surgeon: Melida Quitter, MD;  Location: [facility] OR;  Service: ENT;  Laterality: N/A;  . TUBAL LIGATION      Family History  Problem Relation Age of Onset  . Heart disease Mother   . Hypertension Mother   . Cancer Sister   . Diabetes Sister    Social History:  reports that she has never smoked. She has never used smokeless tobacco. She reports that she does not drink alcohol and does not use drugs.  Allergies: No Known Allergies  Medications Prior to Admission  Medication Sig Dispense Refill  . aspirin EC 81 MG tablet Take 81 mg by mouth daily.    . cholecalciferol (VITAMIN D) 25 MCG (1000 UNIT) tablet Take 1,000 Units by mouth at bedtime.    . Dulaglutide  (TRULICITY) 1.5 JO/8.7OM SOPN Inject 0.5 mLs (1.5 mg total) into the skin once a week. 2 mL 5  . Insulin Glargine (BASAGLAR KWIKPEN) 100 UNIT/ML SOPN Inject 0.2 mLs (20 Units total) into the skin at bedtime. (Patient taking differently: Inject 20 Units into the skin daily. ) 45 mL 3  . losartan-hydrochlorothiazide (HYZAAR) 50-12.5 MG tablet Take 1 tablet by mouth daily. 90 tablet 3  . omeprazole (PRILOSEC) 40 MG capsule Take 40 mg by mouth in the morning and at bedtime.     . pravastatin (PRAVACHOL) 40 MG tablet Take 1 tablet (40 mg total) by mouth daily. (Patient taking differently: Take 40 mg by mouth at bedtime. ) 90 tablet 3  . Tetrahydrozoline HCl (VISINE OP) Apply 1 drop to eye daily as needed (dry eyes).    . Blood Glucose Monitoring Suppl (TRUE METRIX METER) DEVI 1 Device by Does not apply route 2 (two) times daily. 1 Device 0  . glucose blood (TRUE METRIX BLOOD GLUCOSE TEST) test strip Use as instructed 300 each 1  . Insulin Pen Needle (PEN NEEDLES) 32G X 5 MM MISC 1 Units by Does not apply route daily. 100 each 5  . metFORMIN (GLUCOPHAGE-XR) 750 MG 24 hr tablet Take 1 tablet (750 mg total) by mouth in the morning and at bedtime. (Patient not taking: Reported on 07/02/2020) 180 tablet 1    Results for orders  placed or performed during the hospital encounter of 07/13/20 (from the past 48 hour(s))  Glucose, capillary     Status: Abnormal   Collection Time: 07/13/20  9:22 AM  Result Value Ref Range   Glucose-Capillary 210 (H) 70 - 99 mg/dL    Comment: Glucose reference range applies only to samples taken after fasting for at least 8 hours.   No results found.  Review of Systems  All other systems reviewed and are negative.   Blood pressure (!) 161/70, pulse 96, temperature 98.3 F (36.8 C), resp. rate 17, height 5\' 3"  (1.6 m), weight 81.6 kg, SpO2 100 %. Physical Exam Constitutional:      Appearance: Normal appearance.  HENT:     Head: Normocephalic and atraumatic.     Right Ear:  External ear normal.     Left Ear: External ear normal.     Nose: Nose normal.     Mouth/Throat:     Mouth: Mucous membranes are moist.     Pharynx: Oropharynx is clear.  Eyes:     Extraocular Movements: Extraocular movements intact.     Conjunctiva/sclera: Conjunctivae normal.     Pupils: Pupils are equal, round, and reactive to light.  Cardiovascular:     Rate and Rhythm: Normal rate.  Pulmonary:     Effort: Pulmonary effort is normal.  Musculoskeletal:     Cervical back: Normal range of motion.  Skin:    General: Skin is warm and dry.  Neurological:     General: No focal deficit present.     Mental Status: She is alert and oriented to person, place, and time.  Psychiatric:        Mood and Affect: Mood normal.        Behavior: Behavior normal.        Thought Content: Thought content normal.        Judgment: Judgment normal.      Assessment/Plan Subglottic stenosis  To OR for SMDL and possible dilation.  Melida Quitter, MD 07/13/2020, 11:03 AM

## 2020-07-13 NOTE — Op Note (Signed)
PREOPERATIVE DIAGNOSIS:  Subglottic stenosis   POSTOPERATIVE DIAGNOSIS:  Subglottic stenosis   PROCEDURE:  Suspended microdirect laryngoscopy   SURGEON:  Melida Quitter, MD   ANESTHESIA:  General with jet ventilation by anesthesia.   COMPLICATIONS:  None.   INDICATIONS:  The patient is a 74 year old female with a history of idiopathic subglottic stenosis managed initially with tracheostomy and subsequently with serial dilation.  She has been decannulated.  Her last dilation was performed in early August.  She presents to the operating room for interval airway evaluation.   FINDINGS:  There is no significant stenosis.   DESCRIPTION OF PROCEDURE:  The patient was identified in the holding room, informed consent having been obtained including discussion of risks, benefits and alternatives, the patient was brought to the operative suite and put the operative table in  supine position.  Anesthesia was induced and the patient was maintained via mask ventilation.  The eyes were taped closed and bed was turned 90 degrees from anesthesia.  A tooth guard was placed over the upper teeth and a Stortz laryngoscope was placed into the supraglottic position and suspended to Mayo stand using the Lewy arm.  Jet ventilation was initiated.  Photodocumentation was performed with the zero degree telescope.  The laryngoscope was then taking out of suspension and removed from the patient's mouth while suctioning the airway.  The tooth guard was removed and the patient was turned back to anesthesia for wakeup and taken to the recovery room in stable condition.

## 2020-07-13 NOTE — Anesthesia Postprocedure Evaluation (Signed)
Anesthesia Post Note  Patient: Bridget Butler  Procedure(s) Performed: MICROLARYNGOSCOPY with Jet Ventilation (N/A Mouth)     Patient location during evaluation: PACU Anesthesia Type: General Level of consciousness: awake and alert Pain management: pain level controlled Vital Signs Assessment: post-procedure vital signs reviewed and stable Respiratory status: spontaneous breathing, nonlabored ventilation and respiratory function stable Cardiovascular status: blood pressure returned to baseline and stable Postop Assessment: no apparent nausea or vomiting Anesthetic complications: no   No complications documented.  Last Vitals:  Vitals:   07/13/20 1224 07/13/20 1225  BP: (!) 147/79   Pulse: (!) 108 (!) 106  Resp: 14 14  Temp:  36.5 C  SpO2: 95% 99%    Last Pain:  Vitals:   07/13/20 1154  PainSc: Asleep                 Lynda Rainwater

## 2020-07-13 NOTE — Transfer of Care (Signed)
Immediate Anesthesia Transfer of Care Note  Patient: Bridget Butler  Procedure(s) Performed: MICROLARYNGOSCOPY with Jet Ventilation (N/A Mouth)  Patient Location: PACU  Anesthesia Type:General  Level of Consciousness: drowsy and patient cooperative  Airway & Oxygen Therapy: Patient Spontanous Breathing  Post-op Assessment: Report given to RN and Post -op Vital signs reviewed and stable  Post vital signs: Reviewed and stable  Last Vitals:  Vitals Value Taken Time  BP 149/82 07/13/20 1154  Temp 36.6 C 07/13/20 1154  Pulse 116 07/13/20 1207  Resp 12 07/13/20 1207  SpO2 100 % 07/13/20 1207  Vitals shown include unvalidated device data.  Last Pain:  Vitals:   07/13/20 1154  PainSc: Asleep         Complications: No complications documented.

## 2020-07-14 ENCOUNTER — Encounter (HOSPITAL_COMMUNITY): Payer: Self-pay | Admitting: Otolaryngology

## 2020-07-15 DIAGNOSIS — J386 Stenosis of larynx: Secondary | ICD-10-CM | POA: Diagnosis not present

## 2020-07-16 ENCOUNTER — Encounter: Payer: Self-pay | Admitting: Registered Nurse

## 2020-07-16 NOTE — Progress Notes (Signed)
Established Patient Office Visit  Subjective:  Patient ID: Bridget Butler, female    DOB: 14-Feb-1946  Age: 74 y.o. MRN: [id number]  CC:  Chief Complaint  Patient presents with  . Hospitalization Follow-up    Pt stated that she was in the hospital from 6/4-6/29 for a placement of a tracheostomy tube. She is also c/o upset stomach, no energy and peeing too much and frequent urination    HPI Bridget Butler presents for hospital follow up. Admitted 6/4-6/29 planned admission for trach tube placement. Went well, happy to be dc'd   Notes that she has upset stomach, fatigue, and frequent urination  Upset stomach: denies nvd. No melena. No hematochezia. No lightheadedness or dizziness. May be having trouble adjusting to regular diet again. Trying to eat basic foods  Fatigue: may be occurring due to long admission. Sleeping well. Feels better about being home. Wants to make sure nothing else of concern is happening.  Frequent urination: denies hematuria, dysuria, flank pain, fevers, chills, fatigue, sweats.  Otherwise feeling well - generally good overall, but wanted to make sure dc went smoothly.   Past Medical History:  Diagnosis Date  . Cataract   . Diabetes mellitus without complication ([place])   . Dyspnea    on exertion  . GERD (gastroesophageal reflux disease)   . Hyperlipidemia   . Hypertension   . Multiple thyroid nodules   . Thyroid disease     Past Surgical History:  Procedure Laterality Date  . CHOLECYSTECTOMY    . COSMETIC SURGERY Bilateral    eyelids  . EYE SURGERY    . GALLBLADDER SURGERY    . MICROLARYNGOSCOPY WITH CO2 LASER AND EXCISION OF VOCAL CORD LESION N/A 04/02/2020   Procedure: MICROLARYNGOSCOPY WITH DILATION AND CO2 LASER AND EXCISION OF VOCAL CORD LESION w/MITOMYCIN C;  Surgeon: Melida Quitter, MD;  Location: [place];  Service: ENT;  Laterality: N/A;  . MICROLARYNGOSCOPY WITH DILATION N/A 05/23/2020   Procedure: MICROLARYNGOSCOPY W/ DILATION AND JET VENTILATION;   Surgeon: Melida Quitter, MD;  Location: [place];  Service: ENT;  Laterality: N/A;  . MICROLARYNGOSCOPY WITH DILATION N/A 07/13/2020   Procedure: MICROLARYNGOSCOPY with Jet Ventilation;  Surgeon: Melida Quitter, MD;  Location: [place];  Service: ENT;  Laterality: N/A;  . TRACHEOSTOMY TUBE PLACEMENT N/A 04/02/2020   Procedure: Awake TRACHEOSTOMY;  Surgeon: Melida Quitter, MD;  Location: [facility] OR;  Service: ENT;  Laterality: N/A;  . TUBAL LIGATION      Family History  Problem Relation Age of Onset  . Heart disease Mother   . Hypertension Mother   . Cancer Sister   . Diabetes Sister     Social History   Socioeconomic History  . Marital status: Widowed    Spouse name: Not on file  . Number of children: 3  . Years of education: Not on file  . Highest education level: Not on file  Occupational History  . Not on file  Tobacco Use  . Smoking status: Never Smoker  . Smokeless tobacco: Never Used  Vaping Use  . Vaping Use: Never used  Substance and Sexual Activity  . Alcohol use: No  . Drug use: No  . Sexual activity: Not on file  Other Topics Concern  . Not on file  Social History Narrative   Bridget Butler is a 74 year old Jehovah witness patient    She receives support from her daughter primarily who also assists with transportation to medical appointments.    She is  presently independent with her ADLs and iADLs      Social Determinants of Health   Financial Resource Strain:   . Difficulty of Paying Living Expenses: Not on file  Food Insecurity:   . Worried About Charity fundraiser in the Last Year: Not on file  . Ran Out of Food in the Last Year: Not on file  Transportation Needs:   . Lack of Transportation (Medical): Not on file  . Lack of Transportation (Non-Medical): Not on file  Physical Activity:   . Days of Exercise per Week: Not on file  . Minutes of Exercise per Session: Not on file  Stress:   . Feeling of Stress : Not on file  Social Connections:   . Frequency of  Communication with Friends and Family: Not on file  . Frequency of Social Gatherings with Friends and Family: Not on file  . Attends Religious Services: Not on file  . Active Member of Clubs or Organizations: Not on file  . Attends Archivist Meetings: Not on file  . Marital Status: Not on file  Intimate Partner Violence:   . Fear of Current or Ex-Partner: Not on file  . Emotionally Abused: Not on file  . Physically Abused: Not on file  . Sexually Abused: Not on file    Outpatient Medications Prior to Visit  Medication Sig Dispense Refill  . aspirin EC 81 MG tablet Take 81 mg by mouth daily.    . Blood Glucose Monitoring Suppl (TRUE METRIX METER) DEVI 1 Device by Does not apply route 2 (two) times daily. 1 Device 0  . cholecalciferol (VITAMIN D) 25 MCG (1000 UNIT) tablet Take 1,000 Units by mouth at bedtime.    [name] Kitchen glucose blood (TRUE METRIX BLOOD GLUCOSE TEST) test strip Use as instructed 300 each 1  . Insulin Glargine (BASAGLAR KWIKPEN) 100 UNIT/ML SOPN Inject 0.2 mLs (20 Units total) into the skin at bedtime. (Patient taking differently: Inject 20 Units into the skin daily. ) 45 mL 3  . Insulin Pen Needle (PEN NEEDLES) 32G X 5 MM MISC 1 Units by Does not apply route daily. 100 each 5  . losartan-hydrochlorothiazide (HYZAAR) 50-12.5 MG tablet Take 1 tablet by mouth daily. 90 tablet 3  . omeprazole (PRILOSEC) 40 MG capsule Take 40 mg by mouth in the morning and at bedtime.     . pravastatin (PRAVACHOL) 40 MG tablet Take 1 tablet (40 mg total) by mouth daily. (Patient taking differently: Take 40 mg by mouth at bedtime. ) 90 tablet 3  . Tetrahydrozoline HCl (VISINE OP) Apply 1 drop to eye daily as needed (dry eyes).    . Dulaglutide (TRULICITY) 1.61 WR/6.0AV SOPN Inject 0.5 mLs into the skin once a week. (Patient taking differently: Inject 0.5 mLs into the skin once a week. Sunday) 4 pen 5  . sitaGLIPtin-metformin (JANUMET) 50-1000 MG tablet Take 1 tablet by mouth daily. (Patient  taking differently: Take 1 tablet by mouth at bedtime. ) 90 tablet 1   No facility-administered medications prior to visit.    No Known Allergies  ROS Review of Systems Per hpi, otherwise negative per 12 pt ros   Objective:    Physical Exam Vitals and nursing note reviewed.  Constitutional:      General: She is not in acute distress.    Appearance: Normal appearance. She is normal weight. She is not ill-appearing, toxic-appearing or diaphoretic.  Cardiovascular:     Rate and Rhythm: Normal rate and regular rhythm.  Heart sounds: Normal heart sounds. No murmur heard.  No friction rub. No gallop.   Pulmonary:     Effort: Pulmonary effort is normal. No respiratory distress.     Breath sounds: Normal breath sounds. No stridor. No wheezing, rhonchi or rales.  Chest:     Chest wall: No tenderness.  Skin:    General: Skin is warm and dry.  Neurological:     General: No focal deficit present.     Mental Status: She is alert and oriented to person, place, and time. Mental status is at baseline.  Psychiatric:        Mood and Affect: Mood normal.        Behavior: Behavior normal.        Thought Content: Thought content normal.        Judgment: Judgment normal.     BP (!) 146/70   Pulse (!) 111   Temp 97.6 F (36.4 C) (Temporal)   Ht 5\' 3"  (1.6 m)   Wt 180 lb 12.8 oz (82 kg)   SpO2 98%   BMI 32.03 kg/m  Wt Readings from Last 3 Encounters:  07/13/20 179 lb 14.3 oz (81.6 kg)  07/11/20 179 lb 12.8 oz (81.6 kg)  06/04/20 184 lb 6.4 oz (83.6 kg)     Health Maintenance Due  Topic Date Due  . FOOT EXAM  06/02/2020    There are no preventive care reminders to display for this patient.  Lab Results  Component Value Date   TSH 0.148 (L) 06/04/2020   Lab Results  Component Value Date   WBC 7.5 07/11/2020   HGB 10.0 (L) 07/11/2020   HCT 31.8 (L) 07/11/2020   MCV 99.4 07/11/2020   PLT 289 07/11/2020   Lab Results  Component Value Date   NA 134 (L) 07/11/2020    K 4.1 07/11/2020   CO2 27 07/11/2020   GLUCOSE 305 (H) 07/11/2020   BUN 22 07/11/2020   CREATININE 1.30 (H) 07/11/2020   BILITOT 0.3 06/04/2020   ALKPHOS 80 06/04/2020   AST 11 06/04/2020   ALT 10 06/04/2020   PROT 6.7 06/04/2020   ALBUMIN 3.8 06/04/2020   CALCIUM 9.6 07/11/2020   ANIONGAP 9 07/11/2020   GFR 57.00 (L) 01/04/2020   Lab Results  Component Value Date   CHOL 187 06/04/2020   Lab Results  Component Value Date   HDL 67 06/04/2020   Lab Results  Component Value Date   LDLCALC 90 06/04/2020   Lab Results  Component Value Date   TRIG 177 (H) 06/04/2020   Lab Results  Component Value Date   CHOLHDL 2.8 06/04/2020   Lab Results  Component Value Date   HGBA1C 10.4 (H) 07/11/2020      Assessment & Plan:   Problem List Items Addressed This Visit    None    Visit Diagnoses    Frequent urination    -  Primary   Relevant Orders   POCT urinalysis dipstick (Completed)   CBC With Differential (Completed)   Comprehensive metabolic panel (Completed)   Hospital discharge follow-up       Relevant Orders   POCT glucose (manual entry) (Completed)      No orders of the defined types were placed in this encounter.   Follow-up: No follow-ups on file.   PLAN  No acute findings on exam  POCT UA dip not indicative of UTI  Will send out CBC and CMP  Discussed with patient that these may be  adjustments to returning home - keep bland diet, adequate hydration, sleep hygiene, and will likely feel better within next 2-3 weeks.  Patient encouraged to call clinic with any questions, comments, or concerns.  Maximiano Coss, NP

## 2020-07-18 ENCOUNTER — Other Ambulatory Visit: Payer: Self-pay

## 2020-07-18 ENCOUNTER — Ambulatory Visit: Payer: Medicare HMO | Admitting: Internal Medicine

## 2020-07-18 ENCOUNTER — Encounter: Payer: Self-pay | Admitting: Internal Medicine

## 2020-07-18 VITALS — BP 148/70 | HR 92 | Ht 63.0 in | Wt 182.0 lb

## 2020-07-18 DIAGNOSIS — E042 Nontoxic multinodular goiter: Secondary | ICD-10-CM | POA: Diagnosis not present

## 2020-07-18 DIAGNOSIS — E11319 Type 2 diabetes mellitus with unspecified diabetic retinopathy without macular edema: Secondary | ICD-10-CM

## 2020-07-18 DIAGNOSIS — E0590 Thyrotoxicosis, unspecified without thyrotoxic crisis or storm: Secondary | ICD-10-CM | POA: Diagnosis not present

## 2020-07-18 DIAGNOSIS — Z794 Long term (current) use of insulin: Secondary | ICD-10-CM

## 2020-07-18 LAB — TSH: TSH: 0.57 u[IU]/mL (ref 0.35–4.50)

## 2020-07-18 LAB — T3, FREE: T3, Free: 3.1 pg/mL (ref 2.3–4.2)

## 2020-07-18 LAB — T4, FREE: Free T4: 0.66 ng/dL (ref 0.60–1.60)

## 2020-07-18 MED ORDER — JANUMET 50-1000 MG PO TABS: 1.0000 | ORAL_TABLET | Freq: Every day | ORAL | 3 refills | Status: DC

## 2020-07-18 NOTE — Progress Notes (Signed)
Patient ID: Bridget Butler, female   DOB: 1946-07-23, 74 y.o.   MRN: [id number]   This visit occurred during the SARS-CoV-2 public health emergency.  Safety protocols were in place, including screening questions prior to the visit, additional usage of staff PPE, and extensive cleaning of exam room while observing appropriate contact time as indicated for disinfecting solutions.   HPI  Bridget Butler is a 74 y.o.-year-old female, initially referred by her PCP, Dr. Brigitte Pulse, returning for follow-up for toxic thyroid adenomas with subclinical thyrotoxicosis and now also uncontrolled DM2, insulin-dependent, with complications (CKD, DR, PN).  Last visit 1 year and 3 months ago.  Toxic thyroid nodules: Reviewed history: She was diagnosed with hyperthyroidism approximately 2010. She was seeing Dr. Jeanann Lewandowsky - he retired. She was started on medication (cannot remember name) >> felt "off" >> stopped in ~2010.  Reviewed her TFTs: Lab Results  Component Value Date   TSH 0.148 (L) 06/04/2020   TSH 0.03 (L) 04/11/2019   TSH 0.03 (L) 10/29/2018   TSH 0.054 (L) 07/08/2018   TSH 0.118 (L) 03/29/2018   FREET4 0.93 06/08/2020   FREET4 0.84 04/11/2019   FREET4 0.97 10/29/2018   FREET4 1.05 07/08/2018   T3FREE 3.0 04/11/2019   T3FREE 3.8 10/29/2018   T3FREE 3.3 07/08/2018   Her TSI Antibodies Were Not Elevated: Lab Results  Component Value Date   TSI <89 10/29/2018    Thyroid uptake (08/28/2007):  There is heterogeneous uptake within a normal sized gland. There are regions of photopenia within the bilateral upper poles and in the left lower pole. The most intense uptake is in the mid pole of the right lobe of the thyroid gland. Decreased uptake in the inferior pole of the right lobe of the thyroid gland additionally.   Calculated 24-hour I-131 uptake is equal to 25.0%, which is normal (normal 10-30%).   IMPRESSION:   1. Heterogeneous uptake within the thyroid gland could represent resolving subacute  thyroiditis in patient with a normal uptake.  2. Cannot exclude a multinodular goiter if TSH continues to remain depressed  Labs at last visit, we obtained a thyroid uptake and scan (11/30/2018): normal uptake, but multinodular goiter with warm nodules at inferior thyroid poles: 4 hour I-123 uptake = 8.6% (normal 5-20%) 24 hour I-123 uptake = 25.3% (normal 10-30%)  IMPRESSION: Normal 4 hour and 24 hour radio iodine uptakes.  Multinodular thyroid gland with warm nodules at the inferior poles of both thyroid lobes, corresponding to dominant masses identified on a remote thyroid ultrasound consistent with adenomas.  Pt denies: - weight loss - heat intolerance - tremors - palpitations - anxiety - hyperdefecation - hair loss  No FH of thyroid ds. Or cancer. No h/o radiation tx to head or neck.  No seaweed or kelp. No recent contrast studies. No herbal supplements. No Biotin use. No recent steroids use.   Multinodular goiter per ultrasound from 06/28/2013.  Biopsies were benign on 07/13/2013.  Pt denies: - feeling nodules in neck - hoarseness - dysphagia - choking - SOB with lying down  She is referred back to endocrinology also for DM2 management: - DM2 dx'ed: 1992 - insulin started: ~2006  Her diabetes recently became uncontrolled after her first [place] surgery in 03/2020. She was on steroids at the time of the sx.  Reviewed HbA1c: Lab Results  Component Value Date   HGBA1C 10.4 (H) 07/11/2020   HGBA1C 7.0 (H) 03/29/2020   HGBA1C 7.1 (H) 12/05/2019   HGBA1C 6.4 (H) 06/03/2019  HGBA1C 7.0 (H) 07/08/2018   HGBA1C 6.7 (A) 03/29/2018   HGBA1C 7.6 12/29/2017   She is on: - Janumet 50-1000 mg at bedtime - Trulicity 1.61 mg weekly -gets these through patient assistance - Basaglar 20 units at bedtime She had GI intolerance with regular Metformin.  Pt checks her sugars 1x a day and they are: - am: 158-249 - last 4 days: 89-105 - 2h after b'fast: n/c - before lunch:  n/c - 2h after lunch: n/c - before dinner: n/c - 2h after dinner: n/c - bedtime: 300s - last 4 days: n/c - nighttime: n/c Lowest sugar was 89; she has hypoglycemia awareness at 70.  Highest sugar was 400s.  Glucometer: True Metrix  Pt's meals are: - Breakfast: eggs, [place] sausage or oatmeal - Lunch: salad + eggs + [place] meat (no pork) - Dinner: veggies + chicken/turkey and fish - Snacks: not after sx  - + mild CKD, last BUN/creatinine:  Lab Results  Component Value Date   BUN 22 07/11/2020   BUN 13 06/04/2020   CREATININE 1.30 (H) 07/11/2020   CREATININE 0.95 06/04/2020  On losartan 50  - + HL; last set of lipids: Lab Results  Component Value Date   CHOL 187 06/04/2020   HDL 67 06/04/2020   LDLCALC 90 06/04/2020   TRIG 177 (H) 06/04/2020   CHOLHDL 2.8 06/04/2020  On pravastatin 40.  - last eye exam was in 06/2019: + DR  - + numbness and tingling in her feet.  On ASA 81  Pt has FH of DM in mother.  She also has a history of GERD, osteopenia, microlaryngoscopy for subglottic stenosis 06 and 06/2020.  ROS: Constitutional:  + weight gain/+ weight loss, + fatigue, no subjective hyperthermia, no subjective hypothermia, + nocturia Eyes: + blurry vision, no xerophthalmia ENT: no sore throat, no nodules palpated in neck, no dysphagia, no odynophagia, no hoarseness, + hypoacusis Cardiovascular: no CP/+ SOB/no palpitations/+ leg swelling Respiratory: no cough/no SOB/no wheezing Gastrointestinal: no N/no V/no D/+ C/+ acid reflux Musculoskeletal: no muscle aches/no joint aches Skin: no rashes, no hair loss Neurological: no tremors/+ numbness/+ tingling/no dizziness  I reviewed pt's medications, allergies, PMH, social hx, family hx, and changes were documented in the history of present illness. Otherwise, unchanged from my initial visit note.  Past Medical History:  Diagnosis Date  . Cataract   . Diabetes mellitus without complication ([place])   . Dyspnea    on  exertion  . GERD (gastroesophageal reflux disease)   . Hyperlipidemia   . Hypertension   . Multiple thyroid nodules   . Thyroid disease    Past Surgical History:  Procedure Laterality Date  . CHOLECYSTECTOMY    . COSMETIC SURGERY Bilateral    eyelids  . EYE SURGERY    . GALLBLADDER SURGERY    . MICROLARYNGOSCOPY WITH CO2 LASER AND EXCISION OF VOCAL CORD LESION N/A 04/02/2020   Procedure: MICROLARYNGOSCOPY WITH DILATION AND CO2 LASER AND EXCISION OF VOCAL CORD LESION w/MITOMYCIN C;  Surgeon: Melida Quitter, MD;  Location: [facility];  Service: ENT;  Laterality: N/A;  . MICROLARYNGOSCOPY WITH DILATION N/A 05/23/2020   Procedure: MICROLARYNGOSCOPY W/ DILATION AND JET VENTILATION;  Surgeon: Melida Quitter, MD;  Location: [facility];  Service: ENT;  Laterality: N/A;  . MICROLARYNGOSCOPY WITH DILATION N/A 07/13/2020   Procedure: MICROLARYNGOSCOPY with Jet Ventilation;  Surgeon: Melida Quitter, MD;  Location: [facility];  Service: ENT;  Laterality: N/A;  . TRACHEOSTOMY TUBE PLACEMENT N/A 04/02/2020   Procedure: Awake TRACHEOSTOMY;  Surgeon: Melida Quitter, MD;  Location: [facility];  Service: ENT;  Laterality: N/A;  . TUBAL LIGATION     Social History   Socioeconomic History  . Marital status: Widowed    Spouse name: Not on file  . Number of children: 3  . Years of education: Not on file  . Highest education level: Not on file  Occupational History  . Not on file  Tobacco Use  . Smoking status: Never Smoker  . Smokeless tobacco: Never Used  Vaping Use  . Vaping Use: Never used  Substance and Sexual Activity  . Alcohol use: No  . Drug use: No  . Sexual activity: Not on file  Other Topics Concern  . Not on file  Social History Narrative   Mrs Mathison is a 74 year old Jehovah witness patient    She receives support from her daughter primarily who also assists with transportation to medical appointments.    She is presently independent with her ADLs and iADLs      Social Determinants of Health   Financial  Resource Strain:   . Difficulty of Paying Living Expenses: Not on file  Food Insecurity:   . Worried About Charity fundraiser in the Last Year: Not on file  . Ran Out of Food in the Last Year: Not on file  Transportation Needs:   . Lack of Transportation (Medical): Not on file  . Lack of Transportation (Non-Medical): Not on file  Physical Activity:   . Days of Exercise per Week: Not on file  . Minutes of Exercise per Session: Not on file  Stress:   . Feeling of Stress : Not on file  Social Connections:   . Frequency of Communication with Friends and Family: Not on file  . Frequency of Social Gatherings with Friends and Family: Not on file  . Attends Religious Services: Not on file  . Active Member of Clubs or Organizations: Not on file  . Attends Archivist Meetings: Not on file  . Marital Status: Not on file  Intimate Partner Violence:   . Fear of Current or Ex-Partner: Not on file  . Emotionally Abused: Not on file  . Physically Abused: Not on file  . Sexually Abused: Not on file   Current Outpatient Medications on File Prior to Visit  Medication Sig Dispense Refill  . aspirin EC 81 MG tablet Take 81 mg by mouth daily.    . Blood Glucose Monitoring Suppl (TRUE METRIX METER) DEVI 1 Device by Does not apply route 2 (two) times daily. 1 Device 0  . cholecalciferol (VITAMIN D) 25 MCG (1000 UNIT) tablet Take 1,000 Units by mouth at bedtime.    . Dulaglutide (TRULICITY) 1.5 XN/1.7GY SOPN Inject 0.5 mLs (1.5 mg total) into the skin once a week. 2 mL 5  . glucose blood (TRUE METRIX BLOOD GLUCOSE TEST) test strip Use as instructed 300 each 1  . Insulin Glargine (BASAGLAR KWIKPEN) 100 UNIT/ML SOPN Inject 0.2 mLs (20 Units total) into the skin at bedtime. (Patient taking differently: Inject 20 Units into the skin daily. ) 45 mL 3  . Insulin Pen Needle (PEN NEEDLES) 32G X 5 MM MISC 1 Units by Does not apply route daily. 100 each 5  . losartan-hydrochlorothiazide (HYZAAR) 50-12.5  MG tablet Take 1 tablet by mouth daily. 90 tablet 3  . omeprazole (PRILOSEC) 40 MG capsule Take 40 mg by mouth in the morning and at bedtime.     . pravastatin (PRAVACHOL)  40 MG tablet Take 1 tablet (40 mg total) by mouth daily. (Patient taking differently: Take 40 mg by mouth at bedtime. ) 90 tablet 3  . Tetrahydrozoline HCl (VISINE OP) Apply 1 drop to eye daily as needed (dry eyes).     No current facility-administered medications on file prior to visit.   No Known Allergies Family History  Problem Relation Age of Onset  . Heart disease Mother   . Hypertension Mother   . Cancer Sister   . Diabetes Sister    PE: BP (!) 148/70   Pulse 92   Ht 5\' 3"  (1.6 m)   Wt 182 lb (82.6 kg)   SpO2 98%   BMI 32.24 kg/m   Wt Readings from Last 3 Encounters:  07/18/20 182 lb (82.6 kg)  07/13/20 179 lb 14.3 oz (81.6 kg)  07/11/20 179 lb 12.8 oz (81.6 kg)   Constitutional: overweight, in NAD Eyes: PERRLA, EOMI, no exophthalmos ENT: moist mucous membranes, no thyromegaly, no nodules palpated, no cervical lymphadenopathy Cardiovascular: RRR, No MRG Respiratory: CTA B Gastrointestinal: abdomen soft, NT, ND, BS+ Musculoskeletal: no deformities, strength intact in all 4 Skin: moist, warm, no rashes Neurological: no tremor with outstretched hands, DTR normal in all 4  ASSESSMENT: 1. DM2-insulin dependent, uncontrolled, with complications - CKD - DR - PN  2. Thyrotoxicosis  3. Multiple thyroid nodules  PLAN:  1. DM2 -Patient is referred back to endocrinology for this problem.  This is a new problem for me.  I previously saw her for hyperthyroidism and thyroid nodules only in the past. -Patient with uncontrolled type 2 diabetes with an HbA1c of 10.4% on 07/11/2020, increased from 7.0% 3 months prior.  She describes that the sugars worsened significantly after her first laryngeal surgery in 03/2020.  After her last surgery in 06/2020, sugars started to improve.  She also stopped snacking since  the surgery.  In fact, in the last 2 days, sugars have been at goal.  However, she is not checking later in the day and I strongly advised her to start. -She is on Metformin-DPP 4 inhibitor, Long-acting insulin, weekly GLP-1 receptor agonist (all through Pt. Assistance program) -I do not have enough data to decide about changing her regimen at this point.  I did explain that the DPP 4 inhibitor and GLP-1 receptor agonist are in the same class and at next visit, we may need to increase the dose of Trulicity (she is still on the 0.75 mg weekly dose, which is a very low dose), and stop Janumet and change to Metformin ER.  However, at this visit, we discussed about obtaining more data first and get in touch with me if the sugars do not continue to improve. -We also discussed about her diet and it appears that she drinks 2 full glasses of sweet tea daily.  I advised her to stop these completely. -We will not change the rest of the regimen. - I advised the pt to: Patient Instructions  Please STOP sweet tea!  Pleas continue: - Janumet 50-1000 mg at bedtime - Trulicity 3.29 mg weekly  - Basaglar 20 units at bedtime  Please stop at the lab.  Please let me know if the sugars are consistently <80 or >200.  Please return in 3 months with your sugar log.   - advised to check sugars at different times of the day - 1-2x a day, rotating check times - advised for yearly eye exams >> she is UTD - return to clinic in  3 months  2. Patient has a long history of low TSH, without thyrotoxic symptoms: No weight loss, heat intolerance, hyper defecation, palpitations, anxiety.  She does not have apparent exogenous causes for her low TSH. -I reviewed her previous investigation: She had a thyroid uptake and scan showing abnormal uptake with toxic adenoma was in the inferior thyroid lobes -We did not intervene in the past, since she had only a mild TSH with normal free T4 and free T3 -We reviewed together TSH which  was low, and 0.148 on 06/04/2020 this was improved from 0.03 at previous checks.  The free T4 was normal. -At this visit we discussed about possible treatment for the above conditions, most frequently with RAI ablation or surgery.  My suggestion would be for RAI treatment.  She agrees with this, if needed.  I am not sure whether she needs another thyroid uptake before this, since her latest one was more than a year ago.  We will need to check with nuclear medicine first. -At today's visit, she was tachycardic at the beginning of the appointment, but the heart rate normalized at the end of the appointment -We will recheck her TFTs today  3. Multiple thyroid nodules -No neck compression symptoms -The 2 dominant nodules were biopsied with benign results in 2014 -Per review of her more recent uptake and scan from 2020, she had more nodules in the lower poles of her thyroid without indication for rebiopsy -I explained that if we continue with RAI treatment, the nodules were most likely shrink  - Total time spent for the visit: 40 min, in obtaining medical information from the chart, reviewing her  previous labs, imaging evaluations, and treatments, reviewing her symptoms, counseling her about her conditions (please see the discussed topics above), and developing a plan to further investigate and treat them; she had a number of questions which I addressed.  Component     Latest Ref Rng & Units 07/18/2020  TSH     0.35 - 4.50 uIU/mL 0.57  Triiodothyronine,Free,Serum     2.3 - 4.2 pg/mL 3.1  T4,Free(Direct)     0.60 - 1.60 ng/dL 0.66  Currently, her TFTs are normal.  I will recheck them in 3 months, but will not suggest an intervention for now.  However, if they become abnormal again, we will need to go directly to RAI treatment, without hesitation.  Philemon Kingdom, MD PhD [facility] Endocrinology

## 2020-07-18 NOTE — Patient Instructions (Addendum)
Please STOP sweet tea!  Pleas continue: - Janumet 50-1000 mg at bedtime - Trulicity 1.24 mg weekly  - Basaglar 20 units at bedtime  Please stop at the lab.  Please let me know if the sugars are consistently <80 or >200.  Please return in 3 months with your sugar log.   PATIENT INSTRUCTIONS FOR TYPE 2 DIABETES:  DIET AND EXERCISE Diet and exercise is an important part of diabetic treatment.  We recommended aerobic exercise in the form of brisk walking (working between 40-60% of maximal aerobic capacity, similar to brisk walking) for 150 minutes per week (such as 30 minutes five days per week) along with 3 times per week performing 'resistance' training (using various gauge rubber tubes with handles) 5-10 exercises involving the major muscle groups (upper body, lower body and core) performing 10-15 repetitions (or near fatigue) each exercise. Start at half the above goal but build slowly to reach the above goals. If limited by weight, joint pain, or disability, we recommend daily walking in a swimming pool with water up to waist to reduce pressure from joints while allow for adequate exercise.    BLOOD GLUCOSES Monitoring your blood glucoses is important for continued management of your diabetes. Please check your blood glucoses 2-4 times a day: fasting, before meals and at bedtime (you can rotate these measurements - e.g. one day check before the 3 meals, the next day check before 2 of the meals and before bedtime, etc.).   HYPOGLYCEMIA (low blood sugar) Hypoglycemia is usually a reaction to not eating, exercising, or taking too much insulin/ other diabetes drugs.  Symptoms include tremors, sweating, hunger, confusion, headache, etc. Treat IMMEDIATELY with 15 grams of Carbs: . 4 glucose tablets .  cup regular juice/soda . 2 tablespoons raisins . 4 teaspoons sugar . 1 tablespoon honey Recheck blood glucose in 15 mins and repeat above if still symptomatic/blood glucose  <100.  RECOMMENDATIONS TO REDUCE YOUR RISK OF DIABETIC COMPLICATIONS: * Take your prescribed MEDICATION(S) * Follow a DIABETIC diet: Complex carbs, fiber rich foods, (monounsaturated and polyunsaturated) fats * AVOID saturated/trans fats, high fat foods, >2,300 mg salt per day. * EXERCISE at least 5 times a week for 30 minutes or preferably daily.  * DO NOT SMOKE OR DRINK more than 1 drink a day. * Check your FEET every day. Do not wear tightfitting shoes. Contact us if you develop an ulcer * See your EYE doctor once a year or more if needed * Get a FLU shot once a year * Get a PNEUMONIA vaccine once before and once after age 57 years  GOALS:  * Your Hemoglobin A1c of <7%  * fasting sugars need to be <130 * after meals sugars need to be <180 (2h after you start eating) * Your Systolic BP should be 580 or lower  * Your Diastolic BP should be 80 or lower  * Your HDL (Good Cholesterol) should be 40 or higher  * Your LDL (Bad Cholesterol) should be 100 or lower. * Your Triglycerides should be 150 or lower  * Your Urine microalbumin (kidney function) should be <30 * Your Body Mass Index should be 25 or lower    Please consider the following ways to cut down carbs and fat and increase fiber and micronutrients in your diet: - substitute whole grain for white bread or pasta - substitute brown rice for white rice - substitute 90-calorie flat bread pieces for slices of bread when possible - substitute sweet potatoes or yams  for white potatoes - substitute humus for margarine - substitute tofu for cheese when possible - substitute almond or rice milk for regular milk (would not drink soy milk daily due to concern for soy estrogen influence on breast cancer risk) - substitute dark chocolate for other sweets when possible - substitute water - can add lemon or orange slices for taste - for diet sodas (artificial sweeteners will trick your body that you can eat sweets without getting calories and  will lead you to overeating and weight gain in the long run) - do not skip breakfast or other meals (this will slow down the metabolism and will result in more weight gain over time)  - can try smoothies made from fruit and almond/rice milk in am instead of regular breakfast - can also try old-fashioned (not instant) oatmeal made with almond/rice milk in am - order the dressing on the side when eating salad at a restaurant (pour less than half of the dressing on the salad) - eat as little meat as possible - can try juicing, but should not forget that juicing will get rid of the fiber, so would alternate with eating raw veg./fruits or drinking smoothies - use as little oil as possible, even when using olive oil - can dress a salad with a mix of balsamic vinegar and lemon juice, for e.g. - use agave nectar, stevia sugar, or regular sugar rather than artificial sweateners - steam or broil/roast veggies  - snack on veggies/fruit/nuts (unsalted, preferably) when possible, rather than processed foods - reduce or eliminate aspartame in diet (it is in diet sodas, chewing gum, etc) Read the labels!  Try to read Dr. Janene Harvey book: "Program for Reversing Diabetes" for other ideas for healthy eating.

## 2020-07-19 ENCOUNTER — Encounter: Payer: Self-pay | Admitting: Internal Medicine

## 2020-08-23 ENCOUNTER — Other Ambulatory Visit: Payer: Self-pay

## 2020-08-23 ENCOUNTER — Encounter: Payer: Self-pay | Admitting: Family Medicine

## 2020-08-23 ENCOUNTER — Ambulatory Visit (INDEPENDENT_AMBULATORY_CARE_PROVIDER_SITE_OTHER): Payer: Medicare HMO | Admitting: Family Medicine

## 2020-08-23 VITALS — BP 146/76 | HR 84 | Temp 97.8°F | Ht 63.0 in | Wt 186.0 lb

## 2020-08-23 DIAGNOSIS — Z794 Long term (current) use of insulin: Secondary | ICD-10-CM

## 2020-08-23 DIAGNOSIS — Z23 Encounter for immunization: Secondary | ICD-10-CM | POA: Diagnosis not present

## 2020-08-23 DIAGNOSIS — E785 Hyperlipidemia, unspecified: Secondary | ICD-10-CM | POA: Diagnosis not present

## 2020-08-23 DIAGNOSIS — D649 Anemia, unspecified: Secondary | ICD-10-CM | POA: Diagnosis not present

## 2020-08-23 DIAGNOSIS — E1165 Type 2 diabetes mellitus with hyperglycemia: Secondary | ICD-10-CM

## 2020-08-23 DIAGNOSIS — IMO0002 Reserved for concepts with insufficient information to code with codable children: Secondary | ICD-10-CM

## 2020-08-23 DIAGNOSIS — I10 Essential (primary) hypertension: Secondary | ICD-10-CM

## 2020-08-23 LAB — POCT GLYCOSYLATED HEMOGLOBIN (HGB A1C): Hemoglobin A1C: 9.1 % — AB (ref 4.0–5.6)

## 2020-08-23 MED ORDER — PRAVASTATIN SODIUM 40 MG PO TABS: 40.0000 mg | ORAL_TABLET | Freq: Every day | ORAL | 1 refills | Status: DC

## 2020-08-23 MED ORDER — PRAVASTATIN SODIUM 40 MG PO TABS: 40.0000 mg | ORAL_TABLET | Freq: Every day | ORAL | 0 refills | Status: DC

## 2020-08-23 MED ORDER — PRAVASTATIN SODIUM 40 MG PO TABS: 40.0000 mg | ORAL_TABLET | Freq: Every day | ORAL | 3 refills | Status: DC

## 2020-08-23 NOTE — Addendum Note (Signed)
Addended by: Anastasio Auerbach R on: 08/23/2020 03:07 PM   Modules accepted: Orders

## 2020-08-23 NOTE — Progress Notes (Signed)
11/4/20212:41 PM  Bridget Butler Feb 25, 1946, 74 y.o., female [id number]  Chief Complaint  Patient presents with  . Diabetes    diabetes management/ no concerns 118 this am   . Hyperlipidemia    med refills / 30 day to [place] / 90day to [facility]     HPI:   Patient is a 74 y.o. female with past medical history significant for  DM2 on insulin, HTN, HLP, osteopenia, vit D deficiency, hyperthyroidism, subglottic stenosis s/p tracheawho presents today for routine followup  Last OV aug 2020 Patient Care Team: Patient, No Pcp Per as PCP - General (General Practice) Sherlynn Stalls, MD as Consulting Physician (Ophthalmology) Philemon Kingdom, MD as Consulting Physician (Internal Medicine) ENT Dr Redmond Baseman - [facility], last OV aug 2021 - management of subglottic stenosis, trach removed  Endocrinology Dr. Cruzita Lederer 1. DM2 -Patient with uncontrolled type 2 diabetes with an HbA1c of 10.4% on 07/11/2020, increased from 7.0% 3 months prior.  She describes that the sugars worsened significantly after her first laryngeal surgery in 03/2020.  After her last surgery in 06/2020, sugars started to improve.  She also stopped snacking since the surgery.  In fact, in the last 2 days, sugars have been at goal.  However, she is not checking later in the day and I strongly advised her to start. -She is on Metformin-DPP 4 inhibitor, Long-acting insulin, weekly GLP-1 receptor agonist (all through Pt. Assistance program) -I do not have enough data to decide about changing her regimen at this point.  I did explain that the DPP 4 inhibitor and GLP-1 receptor agonist are in the same class and at next visit, we may need to increase the dose of Trulicity (she is still on the 0.75 mg weekly dose, which is a very low dose), and stop Janumet and change to Metformin ER.  However, at this visit, we discussed about obtaining more data first and get in touch with me if the sugars do not continue to improve. -We also discussed about her  diet and it appears that she drinks 2 full glasses of sweet tea daily.  I advised her to stop these completely. -We will not change the rest of the regimen. - I advised the pt to: Patient Instructions  Please STOP sweet tea!  Pleas continue: - Janumet 50-1000 mg at bedtime - Trulicity 1.58 mg weekly  - Basaglar 20 units at bedtime  Please stop at the lab.  Please let me know if the sugars are consistently <80 or >200.  Please return in 3 months with your sugar log.   - advised to check sugars at different times of the day - 1-2x a day, rotating check times - advised for yearly eye exams >> she is UTD - return to clinic in 3 months  2. Patient has a long history of low TSH, without thyrotoxic symptoms: No weight loss, heat intolerance, hyper defecation, palpitations, anxiety.  She does not have apparent exogenous causes for her low TSH. -I reviewed her previous investigation: She had a thyroid uptake and scan showing abnormal uptake with toxic adenoma was in the inferior thyroid lobes -We did not intervene in the past, since she had only a mild TSH with normal free T4 and free T3 -We reviewed together TSH which was low, and 0.148 on 06/04/2020 this was improved from 0.03 at previous checks.  The free T4 was normal. -At this visit we discussed about possible treatment for the above conditions, most frequently with RAI ablation or surgery.  My suggestion would be for RAI treatment.  She agrees with this, if needed.  I am not sure whether she needs another thyroid uptake before this, since her latest one was more than a year ago.  We will need to check with nuclear medicine first. -At today's visit, she was tachycardic at the beginning of the appointment, but the heart rate normalized at the end of the appointment -We will recheck her TFTs today  3. Multiple thyroid nodules -No neck compression symptoms -The 2 dominant nodules were biopsied with benign results in 2014 -Per review of  her more recent uptake and scan from 2020, she had more nodules in the lower poles of her thyroid without indication for rebiopsy -I explained that if we continue with RAI treatment, the nodules were most likely shrink  - Total time spent for the visit: 40 min, in obtaining medical information from the chart, reviewing her  previous labs, imaging evaluations, and treatments, reviewing her symptoms, counseling her about her conditions (please see the discussed topics above), and developing a plan to further investigate and treat them; she had a number of questions which I addressed.  Component     Latest Ref Rng & Units 07/18/2020  TSH     0.35 - 4.50 uIU/mL 0.57  Triiodothyronine,Free,Serum     2.3 - 4.2 pg/mL 3.1  T4,Free(Direct)     0.60 - 1.60 ng/dL 0.66  Currently, her TFTs are normal.  I will recheck them in 3 months, but will not suggest an intervention for now.  However, if they become abnormal again, we will need to go directly to RAI treatment, without hesitation.   1. Insulin dependent type 2 diabetes mellitus, uncontrolled ([place]) Managed by Endocrinology  2. Essential hypertension Controlled. Continue current regime.  Checks BP at home, usually 130/70s BP Readings from Last 3 Encounters:  08/23/20 (!) 146/76  07/18/20 (!) 148/70  07/13/20 (!) 147/79   Losartan/HCTZ: 50/12.5mg    3. Hyperlipidemia LDL goal <100 Controlled. Continue current regime.    4. Low TSH level Managed by endocrinology   Lab Results  Component Value Date   HGBA1C 9.1 (A) 08/23/2020   HGBA1C 10.4 (H) 07/11/2020   HGBA1C 7.0 (H) 03/29/2020   Lab Results  Component Value Date   LDLCALC 90 06/04/2020   CREATININE 1.30 (H) 07/11/2020    Depression screen PHQ 2/9 08/23/2020 04/25/2020 01/02/2020  Decreased Interest 0 0 0  Down, Depressed, Hopeless 0 0 0  PHQ - 2 Score 0 0 0    Fall Risk  08/23/2020 06/04/2020 04/25/2020 01/02/2020 12/05/2019  Falls in the past year? 0 0 0 0 0  Number falls in  past yr: 0 0 0 0 0  Injury with Fall? 0 0 0 0 0  Risk for fall due to : - - - - -  Follow up Falls evaluation completed - Falls evaluation completed Falls evaluation completed;Education provided -     No Known Allergies  Prior to Admission medications   Medication Sig Start Date End Date Taking? Authorizing Provider  aspirin EC 81 MG tablet Take 81 mg by mouth daily.   Yes [provider]  Blood Glucose Monitoring Suppl (TRUE METRIX METER) DEVI 1 Device by Does not apply route 2 (two) times daily. 07/12/18  Yes Timmothy Euler, [place] D, PA-C  cholecalciferol (VITAMIN D) 25 MCG (1000 UNIT) tablet Take 1,000 Units by mouth at bedtime.   Yes [provider]  Dulaglutide (TRULICITY) 1.95 KD/3.2IZ SOPN Inject 0.5 mLs into  the skin once a week. Patient taking differently: Inject 0.5 mLs into the skin once a week. Sunday 09/29/19  Yes Rutherford Guys, MD  glucose blood (TRUE METRIX BLOOD GLUCOSE TEST) test strip Use as instructed 01/05/20  Yes Rutherford Guys, MD  Insulin Glargine (BASAGLAR KWIKPEN) 100 UNIT/ML SOPN Inject 0.2 mLs (20 Units total) into the skin at bedtime. Patient taking differently: Inject 20 Units into the skin daily.  11/16/19  Yes Rutherford Guys, MD  Insulin Pen Needle (PEN NEEDLES) 32G X 5 MM MISC 1 Units by Does not apply route daily. 07/08/18  Yes Shawnee Knapp, MD  losartan-hydrochlorothiazide (HYZAAR) 50-12.5 MG tablet Take 1 tablet by mouth daily. 12/05/19  Yes Rutherford Guys, MD  omeprazole (PRILOSEC) 40 MG capsule Take 40 mg by mouth in the morning and at bedtime.    Yes [provider]  pravastatin (PRAVACHOL) 40 MG tablet Take 1 tablet (40 mg total) by mouth daily. Patient taking differently: Take 40 mg by mouth at bedtime.  06/24/19  Yes Rutherford Guys, MD  sitaGLIPtin-metformin (JANUMET) 50-1000 MG tablet Take 1 tablet by mouth daily. Patient taking differently: Take 1 tablet by mouth at bedtime.  07/07/19  Yes Rutherford Guys, MD   Tetrahydrozoline HCl (VISINE OP) Apply 1 drop to eye daily as needed (dry eyes).   Yes [provider]    Past Medical History:  Diagnosis Date  . Cataract   . Diabetes mellitus without complication ([place])   . Dyspnea    on exertion  . GERD (gastroesophageal reflux disease)   . Hyperlipidemia   . Hypertension   . Multiple thyroid nodules   . Thyroid disease     Past Surgical History:  Procedure Laterality Date  . CHOLECYSTECTOMY    . COSMETIC SURGERY Bilateral    eyelids  . EYE SURGERY    . GALLBLADDER SURGERY    . MICROLARYNGOSCOPY WITH CO2 LASER AND EXCISION OF VOCAL CORD LESION N/A 04/02/2020   Procedure: MICROLARYNGOSCOPY WITH DILATION AND CO2 LASER AND EXCISION OF VOCAL CORD LESION w/MITOMYCIN C;  Surgeon: Melida Quitter, MD;  Location: [facility];  Service: ENT;  Laterality: N/A;  . MICROLARYNGOSCOPY WITH DILATION N/A 05/23/2020   Procedure: MICROLARYNGOSCOPY W/ DILATION AND JET VENTILATION;  Surgeon: Melida Quitter, MD;  Location: [facility];  Service: ENT;  Laterality: N/A;  . MICROLARYNGOSCOPY WITH DILATION N/A 07/13/2020   Procedure: MICROLARYNGOSCOPY with Jet Ventilation;  Surgeon: Melida Quitter, MD;  Location: [facility];  Service: ENT;  Laterality: N/A;  . TRACHEOSTOMY TUBE PLACEMENT N/A 04/02/2020   Procedure: Awake TRACHEOSTOMY;  Surgeon: Melida Quitter, MD;  Location: [facility];  Service: ENT;  Laterality: N/A;  . TUBAL LIGATION      Social History   Tobacco Use  . Smoking status: Never Smoker  . Smokeless tobacco: Never Used  Substance Use Topics  . Alcohol use: No    Family History  Problem Relation Age of Onset  . Heart disease Mother   . Hypertension Mother   . Cancer Sister   . Diabetes Sister     Review of Systems  Constitutional: Negative for chills, fever and malaise/fatigue.  Eyes: Negative for blurred vision and double vision.  Respiratory: Negative for cough, shortness of breath and wheezing.   Cardiovascular: Negative for chest pain, palpitations and  leg swelling.  Gastrointestinal: Negative for abdominal pain, blood in stool, constipation, diarrhea, heartburn, nausea and vomiting.  Genitourinary: Negative for dysuria, frequency and hematuria.  Musculoskeletal: Negative for  back pain, joint pain and myalgias.  Skin: Negative for rash.  Neurological: Negative for dizziness, weakness and headaches.     OBJECTIVE:  Today's Vitals   08/23/20 1357 08/23/20 1418  BP: (!) 170/72 (!) 146/76  Pulse: 84   Temp: 97.8 F (36.6 C)   SpO2: 100%   Weight: 186 lb (84.4 kg)   Height: 5\' 3"  (1.6 m)    Body mass index is 32.95 kg/m. Wt Readings from Last 3 Encounters:  08/23/20 186 lb (84.4 kg)  07/18/20 182 lb (82.6 kg)  07/13/20 179 lb 14.3 oz (81.6 kg)    Physical Exam Vitals and nursing note reviewed.  Constitutional:      Appearance: She is well-developed.  HENT:     Head: Normocephalic and atraumatic.     Mouth/Throat:     Pharynx: No oropharyngeal exudate.  Eyes:     General: No scleral icterus.    Extraocular Movements: Extraocular movements intact.     Conjunctiva/sclera: Conjunctivae normal.     Pupils: Pupils are equal, round, and reactive to light.  Cardiovascular:     Rate and Rhythm: Normal rate and regular rhythm.     Heart sounds: Normal heart sounds. No murmur heard.  No friction rub. No gallop.   Pulmonary:     Effort: Pulmonary effort is normal.     Breath sounds: Normal breath sounds. No stridor. No wheezing, rhonchi or rales.  Musculoskeletal:     Cervical back: Neck supple.     Right lower leg: No edema.     Left lower leg: No edema.  Skin:    General: Skin is warm and dry.  Neurological:     Mental Status: She is alert and oriented to person, place, and time.     Results for orders placed or performed in visit on 08/23/20 (from the past 24 hour(s))  POCT glycosylated hemoglobin (Hb A1C)     Status: Abnormal   Collection Time: 08/23/20  2:18 PM  Result Value Ref Range   Hemoglobin A1C 9.1 (A) 4.0  - 5.6 %   HbA1c POC (<> result, manual entry)     HbA1c, POC (prediabetic range)     HbA1c, POC (controlled diabetic range)      No results found.   ASSESSMENT and PLAN  Problem List Items Addressed This Visit      Cardiovascular and Mediastinum   Essential hypertension - Primary   Relevant Medications   Hyzaar: 50mg /12.5mg  At goal. Stable on current regimen.     Endocrine   Insulin dependent type 2 diabetes mellitus, uncontrolled ([facility])   Relevant Medications   pravastatin (PRAVACHOL) 40 MG tablet   Other Relevant Orders   POCT glycosylated hemoglobin (Hb A1C) (Completed)   HM Diabetes Foot Exam (Completed) Managed by Endocrinology     Other   Hyperlipidemia LDL goal <100   Relevant Medications   pravastatin (PRAVACHOL) 40 MG tablet Stable on current regimen, will follow up with lab results.    Other Visit Diagnoses    Anemia, unspecified type       Relevant Orders   Iron, TIBC and Ferritin Panel   Reticulocytes  TSH Managed by Endocrinology     Will follow up with lab results.   Return in about 3 months (around 11/23/2020) for Medication/lab recheck.    Thank you for your visit with Primary Care at [facility] today.  Huston Foley Nikka Hakimian, FNP-BC [facility] at [place], [place] [address] [phone] - 0000

## 2020-08-23 NOTE — Patient Instructions (Addendum)
Health Maintenance After Age 74 After age 74, you are at a higher risk for certain long-term diseases and infections as well as injuries from falls. Falls are a major cause of broken bones and head injuries in people who are older than age 74. Getting regular preventive care can help to keep you healthy and well. Preventive care includes getting regular testing and making lifestyle changes as recommended by your health care provider. Talk with your health care provider about:  Which screenings and tests you should have. A screening is a test that checks for a disease when you have no symptoms.  A diet and exercise plan that is right for you. What should I know about screenings and tests to prevent falls? Screening and testing are the best ways to find a health problem early. Early diagnosis and treatment give you the best chance of managing medical conditions that are common after age 74. Certain conditions and lifestyle choices may make you more likely to have a fall. Your health care provider may recommend:  Regular vision checks. Poor vision and conditions such as cataracts can make you more likely to have a fall. If you wear glasses, make sure to get your prescription updated if your vision changes.  Medicine review. Work with your health care provider to regularly review all of the medicines you are taking, including over-the-counter medicines. Ask your health care provider about any side effects that may make you more likely to have a fall. Tell your health care provider if any medicines that you take make you feel dizzy or sleepy.  Osteoporosis screening. Osteoporosis is a condition that causes the bones to get weaker. This can make the bones weak and cause them to break more easily.  Blood pressure screening. Blood pressure changes and medicines to control blood pressure can make you feel dizzy.  Strength and balance checks. Your health care provider may recommend certain tests to check your  strength and balance while standing, walking, or changing positions.  Foot health exam. Foot pain and numbness, as well as not wearing proper footwear, can make you more likely to have a fall.  Depression screening. You may be more likely to have a fall if you have a fear of falling, feel emotionally low, or feel unable to do activities that you used to do.  Alcohol use screening. Using too much alcohol can affect your balance and may make you more likely to have a fall. What actions can I take to lower my risk of falls? General instructions  Talk with your health care provider about your risks for falling. Tell your health care provider if: ? You fall. Be sure to tell your health care provider about all falls, even ones that seem minor. ? You feel dizzy, sleepy, or off-balance.  Take over-the-counter and prescription medicines only as told by your health care provider. These include any supplements.  Eat a healthy diet and maintain a healthy weight. A healthy diet includes low-fat dairy products, low-fat (lean) meats, and fiber from whole grains, beans, and lots of fruits and vegetables. Home safety  Remove any tripping hazards, such as rugs, cords, and clutter.  Install safety equipment such as grab bars in bathrooms and safety rails on stairs.  Keep rooms and walkways well-lit. Activity   Follow a regular exercise program to stay fit. This will help you maintain your balance. Ask your health care provider what types of exercise are appropriate for you.  If you need a cane or   or walker, use it as recommended by your health care provider.  Wear supportive shoes that have nonskid soles. Lifestyle  Do not drink alcohol if your health care provider tells you not to drink.  If you drink alcohol, limit how much you have: ? 0-1 drink a day for women. ? 0-2 drinks a day for men.  Be aware of how much alcohol is in your drink. In the [place] one drink equals one typical bottle of  beer (12 oz), one-half glass of wine (5 oz), or one shot of hard liquor (1 oz).  Do not use any products that contain nicotine or tobacco, such as cigarettes and e-cigarettes. If you need help quitting, ask your health care provider. Summary  Having a healthy lifestyle and getting preventive care can help to protect your health and wellness after age 74.  Screening and testing are the best way to find a health problem early and help you avoid having a fall. Early diagnosis and treatment give you the best chance for managing medical conditions that are more common for people who are older than age 74.  Falls are a major cause of broken bones and head injuries in people who are older than age 74. Take precautions to prevent a fall at home.  Work with your health care provider to learn what changes you can make to improve your health and wellness and to prevent falls. This information is not intended to replace advice given to you by your health care provider. Make sure you discuss any questions you have with your health care provider. Document Revised: 01/27/2019 Document Reviewed: 08/19/2017 Elsevier Patient Education  2020 Elsevier Inc.    If you have lab work done today you will be contacted with your lab results within the next 2 weeks.  If you have not heard from us then please contact us. The fastest way to get your results is to register for My Chart.   IF you received an x-ray today, you will receive an invoice from [facility]. Please contact [facility] at [phone] with questions or concerns regarding your invoice.   IF you received labwork today, you will receive an invoice from [facility]. Please contact [facility] at [phone] with questions or concerns regarding your invoice.   Our billing staff will not be able to assist you with questions regarding bills from these companies.  You will be contacted with the lab results as soon as they are available. The  fastest way to get your results is to activate your My Chart account. Instructions are located on the last page of this paperwork. If you have not heard from us regarding the results in 2 weeks, please contact this office.      

## 2020-08-24 LAB — IRON,TIBC AND FERRITIN PANEL
Ferritin: 98 ng/mL (ref 15–150)
Iron Saturation: 29 % (ref 15–55)
Iron: 73 ug/dL (ref 27–139)
Total Iron Binding Capacity: 252 ug/dL (ref 250–450)
UIBC: 179 ug/dL (ref 118–369)

## 2020-08-24 LAB — RETICULOCYTES: Retic Ct Pct: 1.3 % (ref 0.6–2.6)

## 2020-08-24 NOTE — Progress Notes (Signed)
Hello, If you could let Bridget Butler know all of her labs look good and she can continue on her current doses of her medications. Thanks!

## 2020-08-31 DIAGNOSIS — H3582 Retinal ischemia: Secondary | ICD-10-CM | POA: Diagnosis not present

## 2020-08-31 DIAGNOSIS — E113593 Type 2 diabetes mellitus with proliferative diabetic retinopathy without macular edema, bilateral: Secondary | ICD-10-CM | POA: Diagnosis not present

## 2020-08-31 DIAGNOSIS — H43811 Vitreous degeneration, right eye: Secondary | ICD-10-CM | POA: Diagnosis not present

## 2020-09-15 IMAGING — DX DG CHEST 1V PORT
1 series · 1 of 1 positions shown · non-contrast
Comparison: 06/17/2007

CLINICAL DATA: Nausea and vomitting with unexplained tiredness.

EXAM:
PORTABLE CHEST - 1 VIEW

[chest ap]
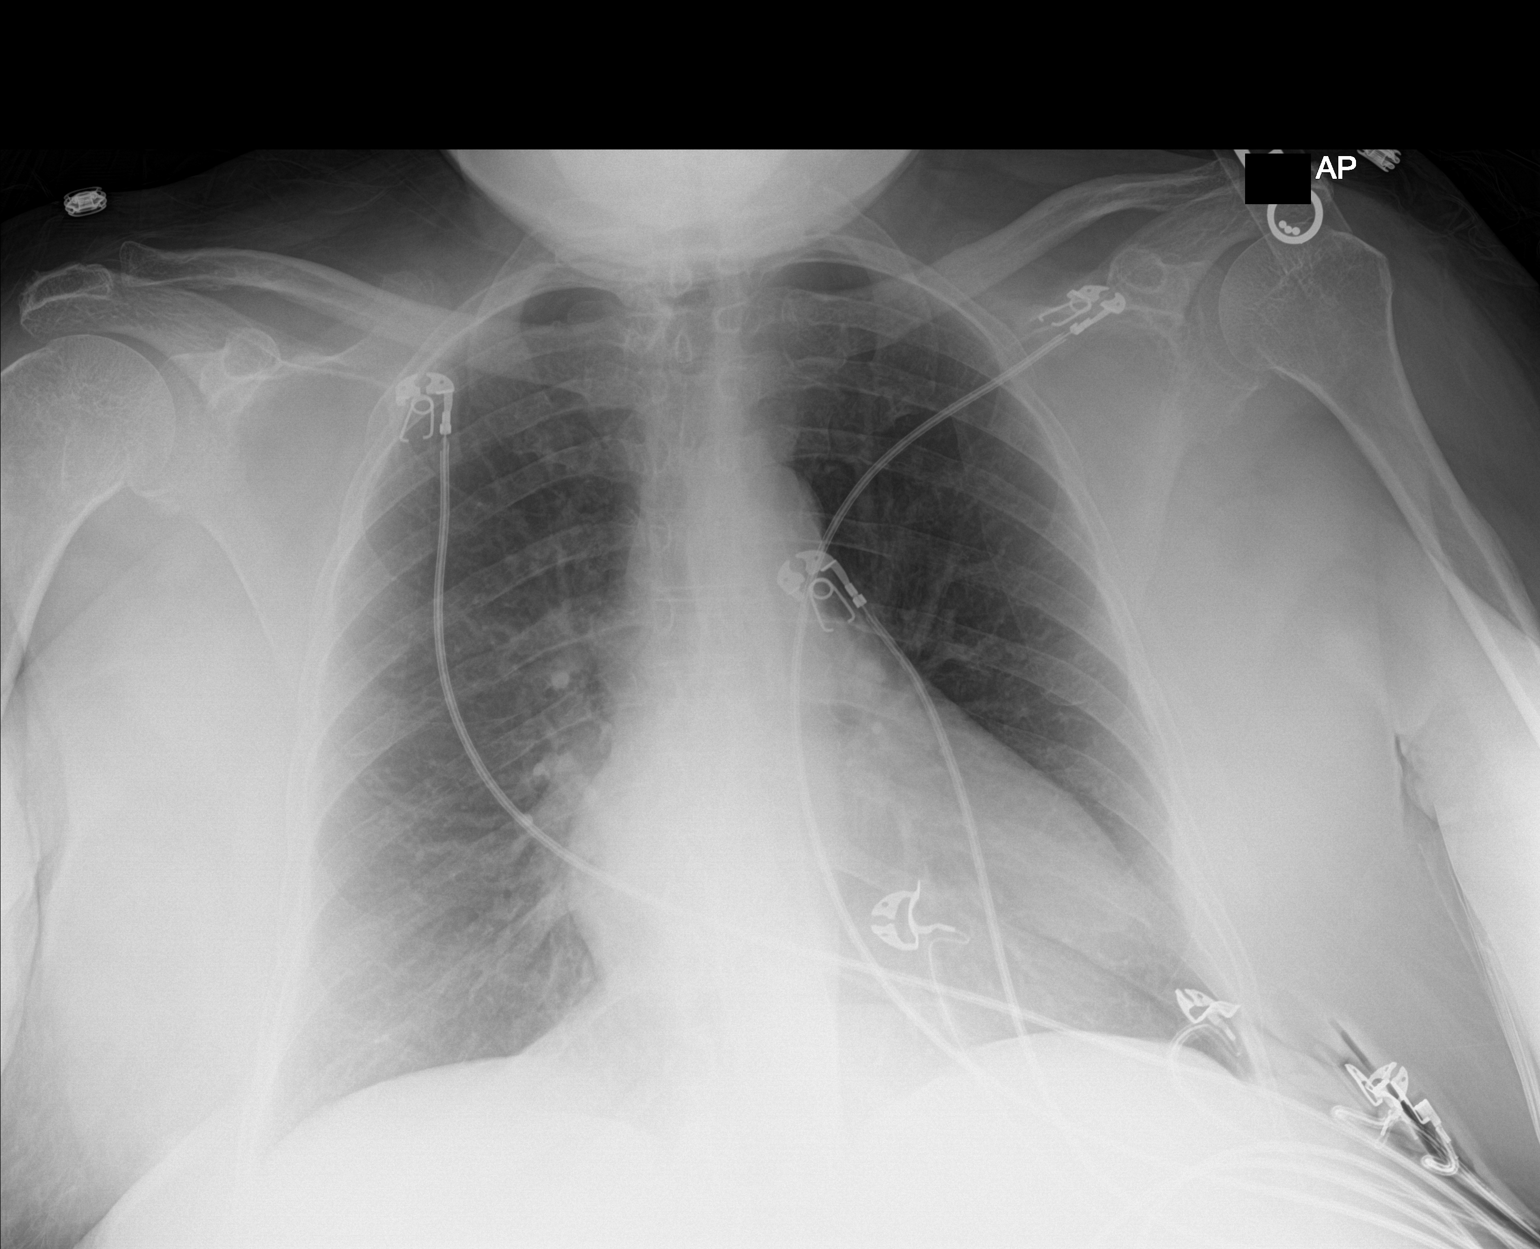

[1 of 1 positions shown; findings below may reference images not displayed]

FINDINGS: Lungs are clear.

Heart size upper limits normal for technique. Normal mediastinal
contour.

No effusion.

Visualized bones unremarkable.
IMPRESSION: Stable borderline cardiomegaly.  No acute findings.

## 2020-09-17 IMAGING — DX DG CHEST 2V
2 series · 2 of 2 positions shown · non-contrast
Comparison: Single view of the chest 09/18/2018. PA and lateral
chest 06/17/2007.

CLINICAL DATA: Shortness of breath today. Bilateral lower extremity
edema.

EXAM:
CHEST - 2 VIEW

[chest pa]
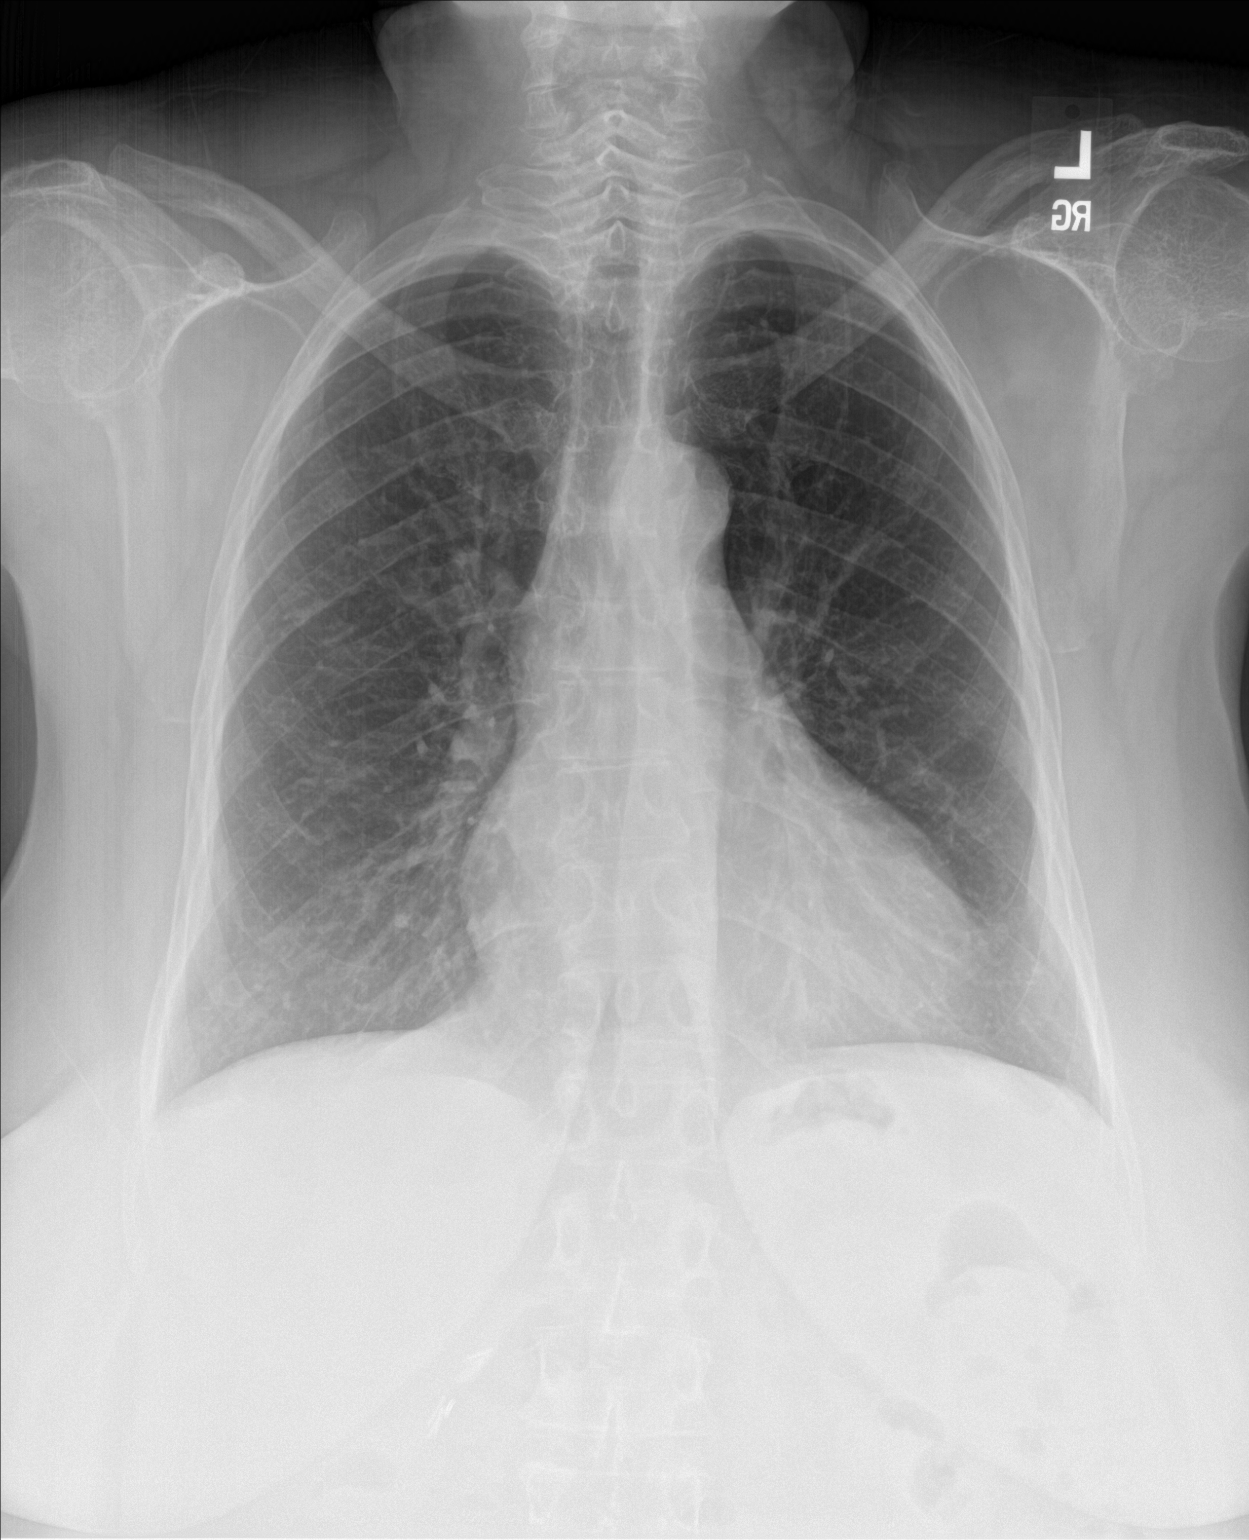

[chest lat]
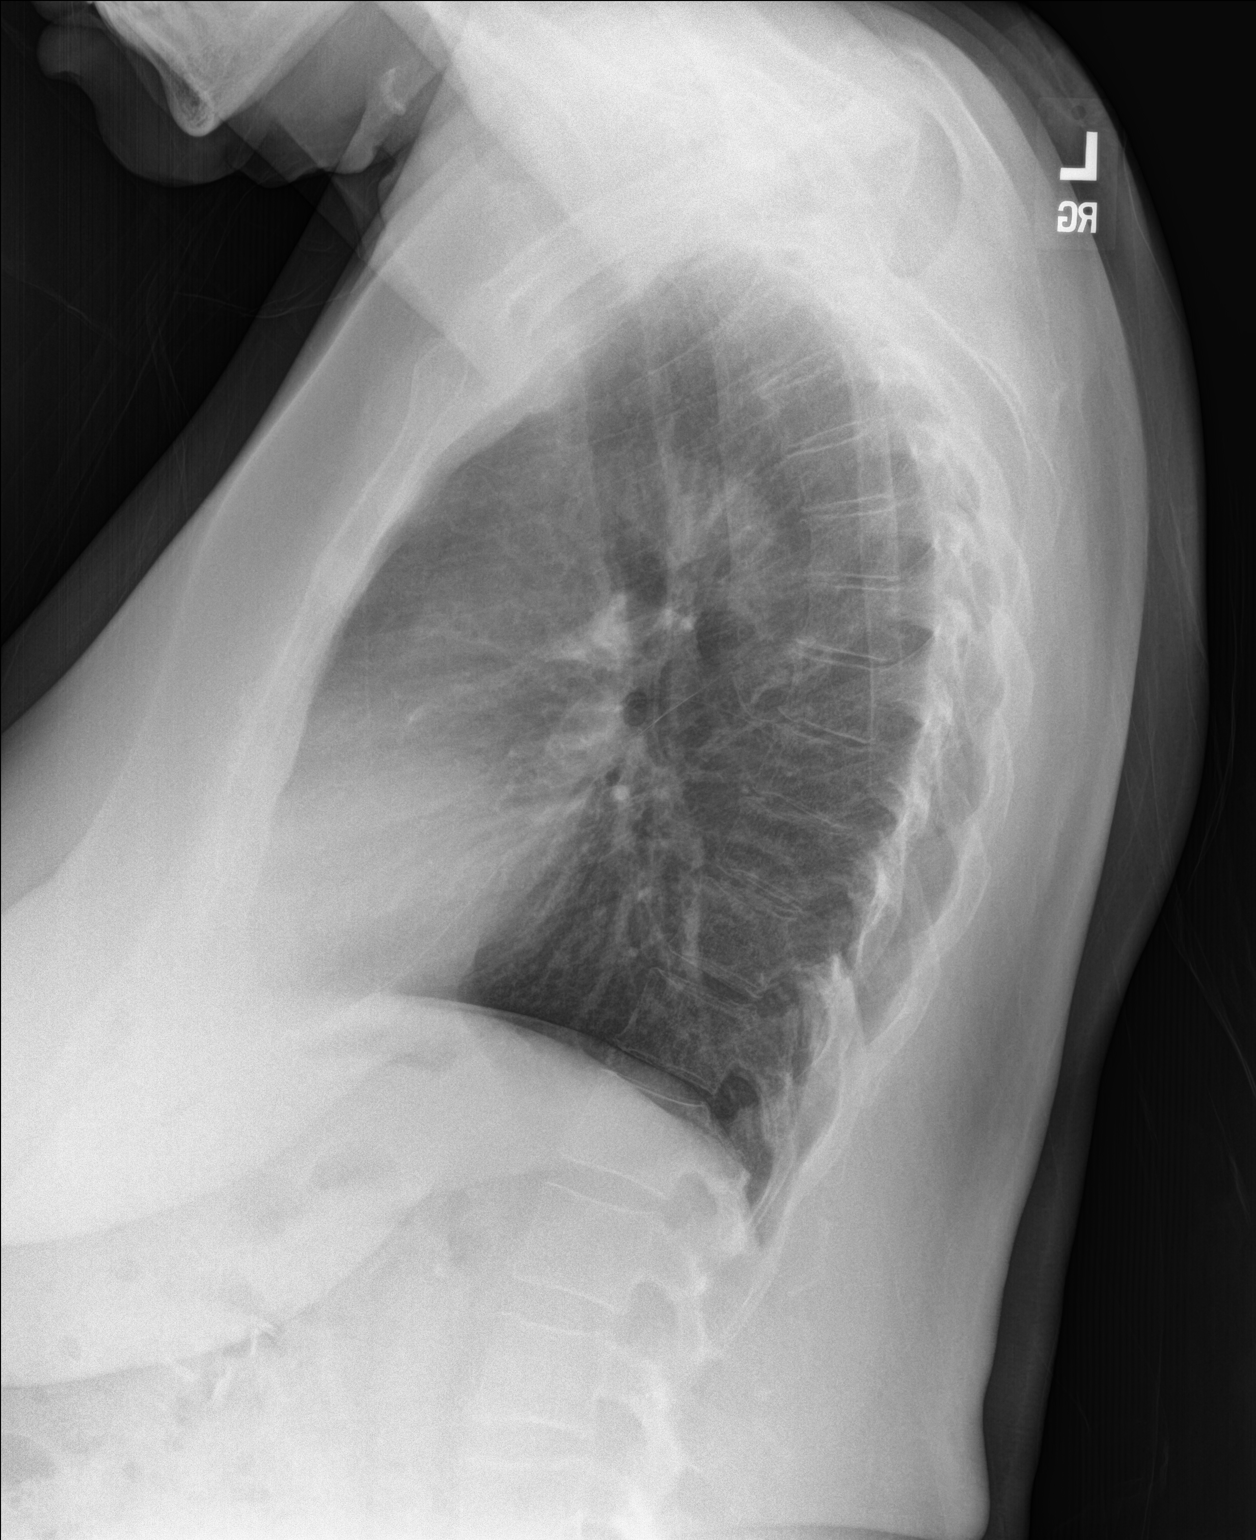

[2 of 2 positions shown; findings below may reference images not displayed]

FINDINGS: The lungs are clear. Heart size is normal. No pneumothorax or
pleural effusion. No acute or focal bony abnormality.
IMPRESSION: No acute disease.

## 2020-09-20 ENCOUNTER — Telehealth: Payer: Self-pay | Admitting: *Deleted

## 2020-09-20 NOTE — Telephone Encounter (Signed)
On 09/19/2020, faxed signed order for tracheostomy products to [facility]. Confirmation 5:43 pm.

## 2020-10-15 ENCOUNTER — Encounter: Payer: Self-pay | Admitting: Internal Medicine

## 2020-10-15 ENCOUNTER — Ambulatory Visit: Payer: Medicare HMO | Admitting: Internal Medicine

## 2020-10-15 ENCOUNTER — Other Ambulatory Visit: Payer: Self-pay

## 2020-10-15 VITALS — BP 130/80 | HR 76 | Ht 63.0 in | Wt 187.8 lb

## 2020-10-15 DIAGNOSIS — Z794 Long term (current) use of insulin: Secondary | ICD-10-CM

## 2020-10-15 DIAGNOSIS — E11319 Type 2 diabetes mellitus with unspecified diabetic retinopathy without macular edema: Secondary | ICD-10-CM

## 2020-10-15 DIAGNOSIS — E0590 Thyrotoxicosis, unspecified without thyrotoxic crisis or storm: Secondary | ICD-10-CM | POA: Diagnosis not present

## 2020-10-15 DIAGNOSIS — E042 Nontoxic multinodular goiter: Secondary | ICD-10-CM

## 2020-10-15 LAB — POCT GLYCOSYLATED HEMOGLOBIN (HGB A1C): Hemoglobin A1C: 7.7 % — AB (ref 4.0–5.6)

## 2020-10-15 MED ORDER — METFORMIN HCL ER 500 MG PO TB24: 1000.0000 mg | ORAL_TABLET | Freq: Every day | ORAL | 11 refills | Status: DC

## 2020-10-15 NOTE — Progress Notes (Signed)
Patient ID: Bridget Butler, female   DOB: 12-18-1945, 74 y.o.   MRN: [id number]   This visit occurred during the SARS-CoV-2 public health emergency.  Safety protocols were in place, including screening questions prior to the visit, additional usage of staff PPE, and extensive cleaning of exam room while observing appropriate contact time as indicated for disinfecting solutions.   HPI  Bridget Butler is a 74 y.o.-year-old female, initially referred by her PCP, Dr. Brigitte Pulse, returning for follow-up for toxic thyroid adenomas with subclinical thyrotoxicosis and now also uncontrolled DM2, insulin-dependent, with complications (CKD, DR, PN).  Last visit 3 months ago  Toxic thyroid nodules: Reviewed history: She was diagnosed with hyperthyroidism approximately 2010. She was seeing Dr. Jeanann Lewandowsky - he retired. She was started on medication (cannot remember name) >> felt "off" >> stopped in ~2010.  Reviewed her TFTs: Lab Results  Component Value Date   TSH 0.57 07/18/2020   TSH 0.148 (L) 06/04/2020   TSH 0.03 (L) 04/11/2019   TSH 0.03 (L) 10/29/2018   TSH 0.054 (L) 07/08/2018   TSH 0.118 (L) 03/29/2018   FREET4 0.66 07/18/2020   FREET4 0.93 06/08/2020   FREET4 0.84 04/11/2019   FREET4 0.97 10/29/2018   FREET4 1.05 07/08/2018   T3FREE 3.1 07/18/2020   T3FREE 3.0 04/11/2019   T3FREE 3.8 10/29/2018   T3FREE 3.3 07/08/2018   Her TSI antibodies were not elevated: Lab Results  Component Value Date   TSI <89 10/29/2018    Thyroid uptake (08/28/2007):  There is heterogeneous uptake within a normal sized gland. There are regions of photopenia within the bilateral upper poles and in the left lower pole. The most intense uptake is in the mid pole of the right lobe of the thyroid gland. Decreased uptake in the inferior pole of the right lobe of the thyroid gland additionally.   Calculated 24-hour I-131 uptake is equal to 25.0%, which is normal (normal 10-30%).   IMPRESSION:   1. Heterogeneous uptake  within the thyroid gland could represent resolving subacute thyroiditis in patient with a normal uptake.  2. Cannot exclude a multinodular goiter if TSH continues to remain depressed  Labs at last visit, we obtained a thyroid uptake and scan (11/30/2018): Normal uptake but multinodular goiter with warm nodules at inferior thyroid poles: 4 hour I-123 uptake = 8.6% (normal 5-20%) 24 hour I-123 uptake = 25.3% (normal 10-30%)  IMPRESSION: Normal 4 hour and 24 hour radio iodine uptakes.  Multinodular thyroid gland with warm nodules at the inferior poles of both thyroid lobes, corresponding to dominant masses identified on a remote thyroid ultrasound consistent with adenomas.  Pt denies: - weight loss - heat intolerance - tremors - palpitations - anxiety - hyperdefecation - hair loss  No FH of thyroid disease or thyroid cancer. No h/o radiation tx to head or neck.  No herbal supplements. No Biotin use. No recent steroids use.   Multinodular goiter per ultrasound from 06/28/2013.  Biopsies were benign on 07/13/2013.  Pt denies: - feeling nodules in neck - hoarseness - dysphagia - choking - SOB with lying down  DM2: -Diagnosed: 9092 - insulin started: ~2006  Her diabetes became uncontrolled after her first [place] surgery in 03/2020.  She was on steroids at the time of her surgery.  Reviewed HbA1c levels: Lab Results  Component Value Date   HGBA1C 9.1 (A) 08/23/2020   HGBA1C 10.4 (H) 07/11/2020   HGBA1C 7.0 (H) 03/29/2020   HGBA1C 7.1 (H) 12/05/2019   HGBA1C 6.4 (H)  06/03/2019   HGBA1C 7.0 (H) 07/08/2018   HGBA1C 6.7 (A) 03/29/2018   HGBA1C 7.6 12/29/2017   She is on: - Janumet 50-1000 mg at bedtime - Trulicity 7.84 mg weekly -gets these through patient assistance - Basaglar 20 units at bedtime >> in am  She had GI intolerance with regular Metformin.  Pt checks her sugars once a day: - am: 158-249 - last 4 days: 89-105 >> 70-100, 120 - 2h after b'fast: n/c -  before lunch: n/c - 2h after lunch: n/c - before dinner: n/c - 2h after dinner: n/c - bedtime: 300s - last 4 days: n/c >> n/c - nighttime: n/c Lowest sugar was 89 >> 63; she has hypoglycemia awareness at 70.  Highest sugar was 400s >> 135.  Glucometer: True Metrix  Pt's meals are: - Breakfast: eggs, [place] sausage or oatmeal - Lunch: salad + eggs + [place] meat (no pork) - Dinner: veggies + chicken/turkey and fish - Snacks: not after sx  -+ Mild CKD, last BUN/creatinine:  Lab Results  Component Value Date   BUN 22 07/11/2020   BUN 13 06/04/2020   CREATININE 1.30 (H) 07/11/2020   CREATININE 0.95 06/04/2020  On losartan 50  -+ HL; last set of lipids: Lab Results  Component Value Date   CHOL 187 06/04/2020   HDL 67 06/04/2020   LDLCALC 90 06/04/2020   TRIG 177 (H) 06/04/2020   CHOLHDL 2.8 06/04/2020  On pravastatin 40.  - last eye exam was in 08/2020: + DR. Sees retina specialist.  -+ Numbness and tingling in her feet.  On ASA 81  Pt has FH of DM in mother.  She also has a history of GERD, osteopenia, microlaryngoscopy for subglottic stenosis 06 and 06/2020.  ROS: Constitutional: no weight gain/no weight loss, + fatigue, no subjective hyperthermia, no subjective hypothermia, + nocturia Eyes: no blurry vision, no xerophthalmia ENT: no sore throat, + see HPI, + hypoacusis Cardiovascular: no CP/no SOB/no palpitations/+ leg swelling Respiratory: no cough/no SOB/no wheezing Gastrointestinal: no N/no V/no D/no C/+ acid reflux Musculoskeletal: no muscle aches/no joint aches Skin: no rashes, no hair loss Neurological: no tremors/+ numbness/+ tingling/no dizziness  I reviewed pt's medications, allergies, PMH, social hx, family hx, and changes were documented in the history of present illness. Otherwise, unchanged from my initial visit note.  Past Medical History:  Diagnosis Date  . Cataract   . Diabetes mellitus without complication ([place])   . Dyspnea    on exertion   . GERD (gastroesophageal reflux disease)   . Hyperlipidemia   . Hypertension   . Multiple thyroid nodules   . Thyroid disease    Past Surgical History:  Procedure Laterality Date  . CHOLECYSTECTOMY    . COSMETIC SURGERY Bilateral    eyelids  . EYE SURGERY    . GALLBLADDER SURGERY    . MICROLARYNGOSCOPY WITH CO2 LASER AND EXCISION OF VOCAL CORD LESION N/A 04/02/2020   Procedure: MICROLARYNGOSCOPY WITH DILATION AND CO2 LASER AND EXCISION OF VOCAL CORD LESION w/MITOMYCIN C;  Surgeon: Melida Quitter, MD;  Location: [facility];  Service: ENT;  Laterality: N/A;  . MICROLARYNGOSCOPY WITH DILATION N/A 05/23/2020   Procedure: MICROLARYNGOSCOPY W/ DILATION AND JET VENTILATION;  Surgeon: Melida Quitter, MD;  Location: [facility];  Service: ENT;  Laterality: N/A;  . MICROLARYNGOSCOPY WITH DILATION N/A 07/13/2020   Procedure: MICROLARYNGOSCOPY with Jet Ventilation;  Surgeon: Melida Quitter, MD;  Location: [facility];  Service: ENT;  Laterality: N/A;  . TRACHEOSTOMY TUBE PLACEMENT N/A 04/02/2020  Procedure: Awake TRACHEOSTOMY;  Surgeon: Melida Quitter, MD;  Location: [facility];  Service: ENT;  Laterality: N/A;  . TUBAL LIGATION     Social History   Socioeconomic History  . Marital status: Widowed    Spouse name: Not on file  . Number of children: 3  . Years of education: Not on file  . Highest education level: Not on file  Occupational History  . Not on file  Tobacco Use  . Smoking status: Never Smoker  . Smokeless tobacco: Never Used  Vaping Use  . Vaping Use: Never used  Substance and Sexual Activity  . Alcohol use: No  . Drug use: No  . Sexual activity: Not on file  Other Topics Concern  . Not on file  Social History Narrative   Mrs Garrow is a 74 year old Jehovah witness patient    She receives support from her daughter primarily who also assists with transportation to medical appointments.    She is presently independent with her ADLs and iADLs      Social Determinants of Health   Financial Resource  Strain: Not on file  Food Insecurity: Not on file  Transportation Needs: Not on file  Physical Activity: Not on file  Stress: Not on file  Social Connections: Not on file  Intimate Partner Violence: Not on file   Current Outpatient Medications on File Prior to Visit  Medication Sig Dispense Refill  . aspirin EC 81 MG tablet Take 81 mg by mouth daily.    . Blood Glucose Monitoring Suppl (TRUE METRIX METER) DEVI 1 Device by Does not apply route 2 (two) times daily. 1 Device 0  . cholecalciferol (VITAMIN D) 25 MCG (1000 UNIT) tablet Take 1,000 Units by mouth at bedtime.    . Dulaglutide (TRULICITY) 1.5 AC/1.6SA SOPN Inject 0.5 mLs (1.5 mg total) into the skin once a week. 2 mL 5  . glucose blood (TRUE METRIX BLOOD GLUCOSE TEST) test strip Use as instructed 300 each 1  . Insulin Glargine (BASAGLAR KWIKPEN) 100 UNIT/ML SOPN Inject 0.2 mLs (20 Units total) into the skin at bedtime. (Patient taking differently: Inject 20 Units into the skin daily. ) 45 mL 3  . Insulin Pen Needle (PEN NEEDLES) 32G X 5 MM MISC 1 Units by Does not apply route daily. 100 each 5  . losartan-hydrochlorothiazide (HYZAAR) 50-12.5 MG tablet Take 1 tablet by mouth daily. 90 tablet 3  . omeprazole (PRILOSEC) 40 MG capsule Take 40 mg by mouth in the morning and at bedtime.     . pravastatin (PRAVACHOL) 40 MG tablet Take 1 tablet (40 mg total) by mouth at bedtime. 30 tablet 0  . sitaGLIPtin-metformin (JANUMET) 50-1000 MG tablet Take 1 tablet by mouth at bedtime. 90 tablet 3  . Tetrahydrozoline HCl (VISINE OP) Apply 1 drop to eye daily as needed (dry eyes).     No current facility-administered medications on file prior to visit.   No Known Allergies Family History  Problem Relation Age of Onset  . Heart disease Mother   . Hypertension Mother   . Cancer Sister   . Diabetes Sister    PE: BP 130/80   Pulse 76   Ht 5\' 3"  (1.6 m)   Wt 187 lb 12.8 oz (85.2 kg)   SpO2 98%   BMI 33.27 kg/m   Wt Readings from Last 3  Encounters:  10/15/20 187 lb 12.8 oz (85.2 kg)  08/23/20 186 lb (84.4 kg)  07/18/20 182 lb (82.6 kg)  Constitutional: overweight, in NAD Eyes: PERRLA, EOMI, no exophthalmos ENT: moist mucous membranes, no thyromegaly, no thyroid nodule palpated, no cervical lymphadenopathy Cardiovascular: RRR, No MRG Respiratory: CTA B Gastrointestinal: abdomen soft, NT, ND, BS+ Musculoskeletal: no deformities, strength intact in all 4 Skin: moist, warm, no rashes Neurological: no tremor with outstretched hands, DTR normal in all 4  ASSESSMENT: 1. DM2-insulin dependent, uncontrolled, with complications - CKD - DR - PN  2. Thyrotoxicosis  3. Multiple thyroid nodules  PLAN:  1. DM2 -I saw the patient for the first time for this problem In 06/2020. I previously saw her for hyperthyroidism and thyroid nodules only in the past. -At last visit, she does have an HbA1c of 10.4% on 07/11/2020, increased from 7.0% 3 months prior.  She mentions that her sugars worsened significantly after her first laryngeal surgery 03/2020.  After her surgery in 06/2020, sugars started to improve.  She also stopped snacking after the last surgery and, for the 2 days prior to the appointment, all sugars were at goal.  Therefore, we continued her Metformin-DPP 4 inhibitors, long-acting insulin, and weekly GLP-1 receptor agonist-all through the patient assistance program.  She was not checking sugars and I strongly advised her to stop.  She was also drinking to 4 glasses of sweet tea daily and I advised her to stop this completely.  At that time we also discussed about possibly stopping the DPP 4 part of Janumet and continue only with Metformin ER, but we did not change this since she had Janumet at home. -At today's visit, her sugars are at goal, but she is only checking sugars in the morning and I advised her to also check later in the day, rotating check times.  She had one low blood sugar in the 60s, but otherwise, sugars are  excellent.  I advised her to decrease the Basaglar dose slightly and we also discussed about trying to stop Januvia part of Janumet.  She agrees to give Metformin ER a try, but she did not tolerate regular Metformin well in the past. - I advised the pt to: Patient Instructions  Please continue: - Janumet 50-1000 mg at bedtime until you run out, then take Metformin ER 1000 mg at bedtime. - Trulicity 1.61 mg weekly   Please decrease: - Basaglar 16 units in am  Please stop at the lab.  Please return in 3 months with your sugar log.   - we checked her HbA1c: 7.7%  (Improved) -higher than expected from her log - advised to check sugars at different times of the day - 1x a day, rotating check times - advised for yearly eye exams >> she is UTD - return to clinic in 3 months  2. Patient with a long history of low TSH without thyrotoxic symptoms.  She does not have weight loss, heat intolerance, hyper defecation, palpitations, anxiety.  She also does not have apparent exogenous causes for her low TSH. -I reviewed her previous investigation: She had a thyroid uptake and scan showing an abnormal uptake with toxic adenomaS in the inferior thyroid lobes -We did not intervene in the past, since she only had a mild TSH with normal free T4 and free T3, earlier this year, a TSH was 0.03, then improved to 0.148 in 05/2020.  At that time, we discussed about definitive treatment for her condition and I suggested RAI treatment.  However, a new set of TFTs obtained at last visit were completely normal.  We decided to wait until this visit  and recheck her TFTs.  If they become abnormal, we will proceed with RAI uptake and then RAI treatment. -At this visit, she does not have signs or symptoms of thyrotoxicosis -We will recheck her TFTs today.  3. Multiple thyroid nodules -She denies neck compression symptoms -The 2 dominant nodules were biopsied with benign results in 2014 -On her recent thyroid uptake and scan  from 2020, she had more nodules in the lower poles of the thyroid, without indication for rebiopsy -We did discuss at last visit that if we do proceed with RAI treatment, the nodules will most likely shrink  Pt did not stop at the lab...  Philemon Kingdom, MD PhD [facility] Endocrinology

## 2020-10-15 NOTE — Patient Instructions (Addendum)
Please continue: - Janumet 50-1000 mg at bedtime until you run out, then take Metformin ER 1000 mg at bedtime. - Trulicity 0.14 mg weekly   Please decrease: - Basaglar 16 units in am  Please stop at the lab.  Please return in 3 months with your sugar log.

## 2020-10-29 ENCOUNTER — Telehealth: Payer: Self-pay

## 2020-10-29 NOTE — Telephone Encounter (Signed)
Outbound call to [facility] at [phone] regarding patient assistance for Janumet. Advised Bethany with [facility] pt switching to Metformin per last OV and patient assistance for Janumet is not needed. Application disregarded.

## 2020-10-29 NOTE — Telephone Encounter (Signed)
Inbound fax 10/25/2020 from [employer] advising patient meets program eligibility requirements and is enrolled until the end of the calendar year. Patient will be contacted directly regarding delivery of medication.

## 2020-11-22 ENCOUNTER — Ambulatory Visit: Payer: Medicare HMO | Admitting: Family Medicine

## 2020-12-12 ENCOUNTER — Telehealth: Payer: Self-pay

## 2020-12-12 NOTE — Telephone Encounter (Signed)
Papers came in from [employer] and stated that the pt meets program eligibility requirements and is enrolled in the program until the end of the calendar year.

## 2021-01-14 ENCOUNTER — Other Ambulatory Visit: Payer: Self-pay

## 2021-01-14 ENCOUNTER — Encounter: Payer: Self-pay | Admitting: Internal Medicine

## 2021-01-14 ENCOUNTER — Ambulatory Visit: Payer: Medicare HMO | Admitting: Internal Medicine

## 2021-01-14 VITALS — BP 138/78 | HR 89 | Ht 63.0 in | Wt 187.8 lb

## 2021-01-14 DIAGNOSIS — E11319 Type 2 diabetes mellitus with unspecified diabetic retinopathy without macular edema: Secondary | ICD-10-CM

## 2021-01-14 DIAGNOSIS — E0590 Thyrotoxicosis, unspecified without thyrotoxic crisis or storm: Secondary | ICD-10-CM

## 2021-01-14 DIAGNOSIS — E042 Nontoxic multinodular goiter: Secondary | ICD-10-CM | POA: Diagnosis not present

## 2021-01-14 DIAGNOSIS — Z794 Long term (current) use of insulin: Secondary | ICD-10-CM | POA: Diagnosis not present

## 2021-01-14 LAB — T3, FREE: T3, Free: 3 pg/mL (ref 2.3–4.2)

## 2021-01-14 LAB — POCT GLYCOSYLATED HEMOGLOBIN (HGB A1C): Hemoglobin A1C: 7.5 % — AB (ref 4.0–5.6)

## 2021-01-14 LAB — T4, FREE: Free T4: 0.9 ng/dL (ref 0.60–1.60)

## 2021-01-14 LAB — TSH: TSH: 0.38 u[IU]/mL (ref 0.35–4.50)

## 2021-01-14 NOTE — Patient Instructions (Addendum)
PLEASE CHECK SOME SUGARS LATER IN THE DAY!  Please continue: - Metformin ER 1000 mg at bedtime. - Trulicity 4.40 mg weekly  - Basaglar 14 units in am  Please stop at the lab.  Please return in 3-4 months with your sugar log.

## 2021-01-14 NOTE — Progress Notes (Signed)
Patient ID: Bridget Butler, female   DOB: Aug 04, 1946, 75 y.o.   MRN: [id number]   This visit occurred during the SARS-CoV-2 public health emergency.  Safety protocols were in place, including screening questions prior to the visit, additional usage of staff PPE, and extensive cleaning of exam room while observing appropriate contact time as indicated for disinfecting solutions.   HPI  Bridget Butler is a 75 y.o.-year-old female, initially referred by her PCP, Dr. Brigitte Pulse, returning for follow-up for toxic thyroid adenomas with subclinical thyrotoxicosis and now also uncontrolled DM2, insulin-dependent, with complications (CKD, DR, PN).  Last visit 3 months ago.  Interim history: Since last visit, she did not experience any new symptoms.  No blurry vision, increased urination (she still has nocturia x1) or pain. She is still only checking blood sugars in the morning, did not start checking later in the day.  She had to back off her Basaglar slightly due to lower blood sugars in the morning.  Toxic thyroid nodules: Reviewed history: She was diagnosed with hyperthyroidism approximately 2010. She was seeing Dr. Jeanann Lewandowsky - he retired. She was started on medication (cannot remember name) >> felt "off" >> stopped in ~2010.  Reviewed her TFTs: Lab Results  Component Value Date   TSH 0.57 07/18/2020   TSH 0.148 (L) 06/04/2020   TSH 0.03 (L) 04/11/2019   TSH 0.03 (L) 10/29/2018   TSH 0.054 (L) 07/08/2018   TSH 0.118 (L) 03/29/2018   FREET4 0.66 07/18/2020   FREET4 0.93 06/08/2020   FREET4 0.84 04/11/2019   FREET4 0.97 10/29/2018   FREET4 1.05 07/08/2018   T3FREE 3.1 07/18/2020   T3FREE 3.0 04/11/2019   T3FREE 3.8 10/29/2018   T3FREE 3.3 07/08/2018   Her TSI antibodies were not elevated: Lab Results  Component Value Date   TSI <89 10/29/2018    Thyroid uptake (08/28/2007):  There is heterogeneous uptake within a normal sized gland. There are regions of photopenia within the bilateral upper  poles and in the left lower pole. The most intense uptake is in the mid pole of the right lobe of the thyroid gland. Decreased uptake in the inferior pole of the right lobe of the thyroid gland additionally.   Calculated 24-hour I-131 uptake is equal to 25.0%, which is normal (normal 10-30%).   IMPRESSION:   1. Heterogeneous uptake within the thyroid gland could represent resolving subacute thyroiditis in patient with a normal uptake.  2. Cannot exclude a multinodular goiter if TSH continues to remain depressed  Labs at last visit, we obtained a thyroid uptake and scan (11/30/2018): Normal uptake but multinodular goiter with warm nodules at inferior thyroid poles: 4 hour I-123 uptake = 8.6% (normal 5-20%) 24 hour I-123 uptake = 25.3% (normal 10-30%)  IMPRESSION: Normal 4 hour and 24 hour radio iodine uptakes.  Multinodular thyroid gland with warm nodules at the inferior poles of both thyroid lobes, corresponding to dominant masses identified on a remote thyroid ultrasound consistent with adenomas.  Pt denies: - weight loss - heat intolerance - tremors - palpitations - anxiety - hyperdefecation - hair loss  No FH of thyroid disease or thyroid cancer. No h/o radiation tx to head or neck.  No herbal supplements. No Biotin use. No recent steroids use.   Multinodular goiter per ultrasound from 06/28/2013.   Biopsies were benign on 07/13/2013.  Pt denies: - feeling nodules in neck - hoarseness - dysphagia - choking - SOB with lying down  DM2: -Diagnosed: 9092 - insulin started: ~2006  Her diabetes became uncontrolled after her first laryngeal surgery in 03/2020.  She was on steroids at the time of her surgery.  Reviewed HbA1c levels: Lab Results  Component Value Date   HGBA1C 7.7 (A) 10/15/2020   HGBA1C 9.1 (A) 08/23/2020   HGBA1C 10.4 (H) 07/11/2020   HGBA1C 7.0 (H) 03/29/2020   HGBA1C 7.1 (H) 12/05/2019   HGBA1C 6.4 (H) 06/03/2019   HGBA1C 7.0 (H) 07/08/2018    HGBA1C 6.7 (A) 03/29/2018   HGBA1C 7.6 12/29/2017   She is on: - Janumet 50-1000 mg at bedtime >> Metformin ER 1000 mg at bedtime - Trulicity 5.95 mg weekly -gets these through patient assistance - Basaglar 20 units at bedtime >> in am >> 16 >> 14 units in a.m. (last week) She had GI intolerance with regular Metformin.  Pt checks her sugars once a day: - am: 158-249 - last 4 days: 89-105 >> 70-100, 120 >> 69, 80-90, 145 - 2h after b'fast: n/c - before lunch: n/c - 2h after lunch: n/c - before dinner: n/c - 2h after dinner: n/c - bedtime: 300s - last 4 days: n/c >> n/c - nighttime: n/c Lowest sugar was 89 >> 63 >> 69; she has hypoglycemia awareness at 70.  Highest sugar was 400s >> 135 >> 145.  Glucometer: True Metrix  Pt's meals are: - Breakfast: eggs, [place] sausage or oatmeal - Lunch: salad + eggs + [place] meat (no pork) - Dinner: veggies + chicken/turkey and fish - Snacks: not after sx  -+ Mild CKD, last BUN/creatinine:  Lab Results  Component Value Date   BUN 22 07/11/2020   BUN 13 06/04/2020   CREATININE 1.30 (H) 07/11/2020   CREATININE 0.95 06/04/2020  On losartan 50  -+ HL; last set of lipids: Lab Results  Component Value Date   CHOL 187 06/04/2020   HDL 67 06/04/2020   LDLCALC 90 06/04/2020   TRIG 177 (H) 06/04/2020   CHOLHDL 2.8 06/04/2020  On pravastatin 40.  - last eye exam was in 08/2020: + DR. Sees retina specialist.  -+ Numbness and tingling in her feet.  On ASA 81  Pt has FH of DM in mother.  She also has a history of GERD, osteopenia, microlaryngoscopy for subglottic stenosis 06 and 06/2020.  ROS: Constitutional: no weight gain/no weight loss, + fatigue, no subjective hyperthermia, no subjective hypothermia, + nocturia Eyes: no blurry vision, no xerophthalmia ENT: no sore throat, + see HPI, + hypoacusis Cardiovascular: no CP/no SOB/no palpitations/no leg swelling Respiratory: no cough/no SOB/no wheezing Gastrointestinal: no N/no V/no  D/no C/no acid reflux Musculoskeletal: no muscle aches/no joint aches Skin: no rashes, no hair loss Neurological: no tremors/+ numbness/+ tingling/no dizziness  I reviewed pt's medications, allergies, PMH, social hx, family hx, and changes were documented in the history of present illness. Otherwise, unchanged from my initial visit note.  Past Medical History:  Diagnosis Date   Cataract    Diabetes mellitus without complication (HCC)    Dyspnea    on exertion   GERD (gastroesophageal reflux disease)    Hyperlipidemia    Hypertension    Multiple thyroid nodules    Thyroid disease    Past Surgical History:  Procedure Laterality Date   CHOLECYSTECTOMY     COSMETIC SURGERY Bilateral    eyelids   EYE SURGERY     GALLBLADDER SURGERY     MICROLARYNGOSCOPY WITH CO2 LASER AND EXCISION OF VOCAL CORD LESION N/A 04/02/2020   Procedure: MICROLARYNGOSCOPY WITH DILATION AND CO2 LASER AND  EXCISION OF VOCAL CORD LESION w/MITOMYCIN C;  Surgeon: Melida Quitter, MD;  Location: [place];  Service: ENT;  Laterality: N/A;   MICROLARYNGOSCOPY WITH DILATION N/A 05/23/2020   Procedure: MICROLARYNGOSCOPY W/ DILATION AND JET VENTILATION;  Surgeon: Melida Quitter, MD;  Location: [place];  Service: ENT;  Laterality: N/A;   MICROLARYNGOSCOPY WITH DILATION N/A 07/13/2020   Procedure: MICROLARYNGOSCOPY with Jet Ventilation;  Surgeon: Melida Quitter, MD;  Location: [place];  Service: ENT;  Laterality: N/A;   TRACHEOSTOMY TUBE PLACEMENT N/A 04/02/2020   Procedure: Awake TRACHEOSTOMY;  Surgeon: Melida Quitter, MD;  Location: [place];  Service: ENT;  Laterality: N/A;   TUBAL LIGATION     Social History   Socioeconomic History   Marital status: Widowed    Spouse name: Not on file   Number of children: 3   Years of education: Not on file   Highest education level: Not on file  Occupational History   Not on file  Tobacco Use   Smoking status: Never Smoker   Smokeless tobacco: Never Used  Vaping Use    Vaping Use: Never used  Substance and Sexual Activity   Alcohol use: No   Drug use: No   Sexual activity: Not on file  Other Topics Concern   Not on file  Social History Narrative   Mrs Macy is a 75 year old Jehovah witness patient    She receives support from her daughter primarily who also assists with transportation to medical appointments.    She is presently independent with her ADLs and iADLs      Social Determinants of Health   Financial Resource Strain: Not on file  Food Insecurity: Not on file  Transportation Needs: Not on file  Physical Activity: Not on file  Stress: Not on file  Social Connections: Not on file  Intimate Partner Violence: Not on file   Current Outpatient Medications on File Prior to Visit  Medication Sig Dispense Refill   aspirin EC 81 MG tablet Take 81 mg by mouth daily.     Blood Glucose Monitoring Suppl (TRUE METRIX METER) DEVI 1 Device by Does not apply route 2 (two) times daily. 1 Device 0   cholecalciferol (VITAMIN D) 25 MCG (1000 UNIT) tablet Take 1,000 Units by mouth at bedtime.     Dulaglutide (TRULICITY) 1.5 KG/2.5KY SOPN Inject 0.5 mLs (1.5 mg total) into the skin once a week. 2 mL 5   glucose blood (TRUE METRIX BLOOD GLUCOSE TEST) test strip Use as instructed 300 each 1   Insulin Glargine (BASAGLAR KWIKPEN) 100 UNIT/ML SOPN Inject 0.2 mLs (20 Units total) into the skin at bedtime. (Patient taking differently: Inject 20 Units into the skin daily.) 45 mL 3   Insulin Pen Needle (PEN NEEDLES) 32G X 5 MM MISC 1 Units by Does not apply route daily. 100 each 5   losartan-hydrochlorothiazide (HYZAAR) 50-12.5 MG tablet Take 1 tablet by mouth daily. 90 tablet 3   metFORMIN (GLUCOPHAGE-XR) 500 MG 24 hr tablet Take 2 tablets (1,000 mg total) by mouth daily with supper. 60 tablet 11   omeprazole (PRILOSEC) 40 MG capsule Take 40 mg by mouth in the morning and at bedtime.      pravastatin (PRAVACHOL) 40 MG tablet Take 1 tablet (40 mg  total) by mouth at bedtime. 30 tablet 0   Tetrahydrozoline HCl (VISINE OP) Apply 1 drop to eye daily as needed (dry eyes).     No current facility-administered medications on file prior to visit.  No Known Allergies Family History  Problem Relation Age of Onset   Heart disease Mother    Hypertension Mother    Cancer Sister    Diabetes Sister    PE: BP 138/78 (BP Location: Right Arm, Patient Position: Sitting, Cuff Size: Normal)    Pulse 89    Ht 5\' 3"  (1.6 m)    Wt 187 lb 12.8 oz (85.2 kg)    SpO2 98%    BMI 33.27 kg/m   Wt Readings from Last 3 Encounters:  01/14/21 187 lb 12.8 oz (85.2 kg)  10/15/20 187 lb 12.8 oz (85.2 kg)  08/23/20 186 lb (84.4 kg)   Constitutional: overweight, in NAD Eyes: PERRLA, EOMI, no exophthalmos ENT: moist mucous membranes, no thyromegaly, no thyroid nodule palpated, no cervical lymphadenopathy Cardiovascular: RRR, No MRG Respiratory: CTA B Gastrointestinal: abdomen soft, NT, ND, BS+ Musculoskeletal: no deformities, strength intact in all 4 Skin: moist, warm, no rashes Neurological: no tremor with outstretched hands, DTR normal in all 4  ASSESSMENT: 1. DM2-insulin dependent, uncontrolled, with complications - CKD - DR - PN  2.  Thyrotoxic adenomas  3. Multiple thyroid nodules  PLAN:  1. DM2 -Patient's diabetes control deteriorated.  06/2020, with an HbA1c returned 10.4%, increased from 7.0% 3 months prior.  She felt that her sugars worsened significantly after her first laryngeal surgery 03/2020.  Sugars started to improve after 06/2020.  She also stopped snacking and overall improve her diet.  She was still drinking 4 glasses of sweet tea daily at previous visit and I strongly advised her to stop.  We have her on Metformin, low-dose weekly GLP-1 receptor agonist and also long-acting insulin.  At last visit we changed her from [place] to Metformin ER.  Sugars were at goal but she was only checking sugars in the morning and I advised her to  also check later in the day, rotating check times.  Since she had 1 low blood sugar at 60, advised her to decrease Basaglar slightly.  HbA1c at that time was better, at 7.7%. -At this visit, reviewing her blood sugars at home, they are at goal in the morning, mostly between 80s and 90s.  However, she is not checking sugars later in the day so it is difficult to make treatment decisions.  I do agree with decreasing the Basaglar, which she did approximately 2 weeks ago, with no significant change in her blood sugars. She also does not have any lows. -At this visit, I again strongly advised her to check sugars today during the day, also, rotating check times.  At next visit, she may need an increase in Trulicity if these are higher. - I advised the pt to: Patient Instructions  PLEASE [place]!  Please continue: - Metformin ER 1000 mg at bedtime. - Trulicity 2.53 mg weekly  - Basaglar 14 units in am  Please stop at the lab.  Please return in 3-4 months with your sugar log.   - we checked her HbA1c: 7.5% (lower) - advised to check sugars at different times of the day - 1x a day, rotating check times - advised for yearly eye exams >> she is UTD - return to clinic in 3 months  2. Patient with a long history of low TSH without thyrotoxic symptoms.  She denies weight loss, heat intolerance, hyper defecation, palpitations, anxiety.  She does not have apparent exogenous causes for her low TSH.  Previous investigation showed an abnormal uptake with toxic adenomas  in the inferior thyroid lobes. -We did not intervene in the past since she only had a mildly suppressed TSH with normal free T4 and free T3.  In 03/2019, a TSH was quite low, at 0.03 and we discussed about RAI treatment, but TFTs improved afterwards.  At last visit, TSH was normal, at 0.58. -At this visit, he does not have thyrotoxic signs or symptoms -We are now following her conservatively -will recheck her TFTs  today  3. Multiple thyroid nodules -No neck compression symptoms -The 2 dominant nodules were biopsied with benign results in 2014 -Reviewed the thyroid uptake and scan from 2020.  At that time, she had more nodules in the lower poles of the thyroid, but without indication for biopsy -We did discuss in the past that RAI treatment can shrink these nodules, in case we need to go this route  Component     Latest Ref Rng & Units 01/14/2021  TSH     0.35 - 4.50 uIU/mL 0.38  Triiodothyronine,Free,Serum     2.3 - 4.2 pg/mL 3.0  T4,Free(Direct)     0.60 - 1.60 ng/dL 0.90  Thyroid tests are normal.  Philemon Kingdom, MD PhD [facility]

## 2021-01-15 ENCOUNTER — Encounter: Payer: Self-pay | Admitting: Internal Medicine

## 2021-01-28 ENCOUNTER — Telehealth: Payer: Self-pay | Admitting: Internal Medicine

## 2021-01-28 DIAGNOSIS — E1165 Type 2 diabetes mellitus with hyperglycemia: Secondary | ICD-10-CM

## 2021-01-28 DIAGNOSIS — IMO0002 Reserved for concepts with insufficient information to code with codable children: Secondary | ICD-10-CM

## 2021-01-28 NOTE — Telephone Encounter (Signed)
MEDICATION: losartan HCTZ 50-2.5 mg  PHARMACY:   [facility] ([facility] [place] [address] Phone:  [phone]  Fax:  [phone]      HAS THE PATIENT CONTACTED THEIR PHARMACY?  yes  IS THIS A 90 DAY SUPPLY : yes  IS PATIENT OUT OF MEDICATION: yes  IF NOT; HOW MUCH IS LEFT:   LAST APPOINTMENT DATE: @3 /28/2022  NEXT APPOINTMENT DATE:@7 /18/2022  DO WE HAVE YOUR PERMISSION TO LEAVE A DETAILED MESSAGE?: yes  OTHER COMMENTS: patient has been prescribed this medication by her PCP at [facility], but this office has gone out of business. So patient wanted to know if Dr Cruzita Lederer would be able to refill?   **Let patient know to contact pharmacy at the end of the day to make sure medication is ready. **  ** Please notify patient to allow 48-72 hours to process**  **Encourage patient to contact the pharmacy for refills or they can request refills through [facility]**

## 2021-01-31 NOTE — Telephone Encounter (Signed)
Patient called to check status of this refill- her PCP ([place]) is no longer open and she needs her BP medication until she can establish care with a new PCP - call back [phone]

## 2021-01-31 NOTE — Telephone Encounter (Signed)
Patient is also requesting a refill of all diabetic testing supplies via [facility] mail in pharmacy

## 2021-02-01 MED ORDER — TRUE METRIX BLOOD GLUCOSE TEST VI STRP: ORAL_STRIP | 2 refills | Status: DC

## 2021-02-01 NOTE — Addendum Note (Signed)
Addended by: Lauralyn Primes on: 02/01/2021 07:20 PM   Modules accepted: Orders

## 2021-02-01 NOTE — Telephone Encounter (Signed)
Rx for test strips sent. Pt notified via mychart message to contact pharmacy for refill of [place]

## 2021-02-25 ENCOUNTER — Ambulatory Visit: Payer: Medicare HMO | Admitting: Physician Assistant

## 2021-02-25 ENCOUNTER — Other Ambulatory Visit: Payer: Self-pay

## 2021-02-25 VITALS — BP 160/61 | HR 89 | Temp 98.2°F | Resp 18 | Ht 63.0 in | Wt 188.0 lb

## 2021-02-25 DIAGNOSIS — I10 Essential (primary) hypertension: Secondary | ICD-10-CM

## 2021-02-25 DIAGNOSIS — E559 Vitamin D deficiency, unspecified: Secondary | ICD-10-CM | POA: Diagnosis not present

## 2021-02-25 DIAGNOSIS — G5601 Carpal tunnel syndrome, right upper limb: Secondary | ICD-10-CM

## 2021-02-25 MED ORDER — LOSARTAN POTASSIUM-HCTZ 50-12.5 MG PO TABS: 1.0000 | ORAL_TABLET | Freq: Every day | ORAL | 3 refills | Status: DC

## 2021-02-25 NOTE — Patient Instructions (Signed)
I sent a refill of your blood pressure medication to your pharmacy.  Please feel free to return to the mobile unit if your blood pressures do not resume to within normal limits after restarting your blood pressure medication.  For your carpal tunnel, I recommend that you wear a brace at bedtime.  I do encourage you to continue your vitamin D on a daily basis, either 1000 units or 2000 units is acceptable.  Please let us know if there is anything else we can do for you  Kennieth Rad, PA-C Physician Assistant [place] [email or web address]    Carpal Tunnel Syndrome  Carpal tunnel syndrome is a condition that causes pain, numbness, and weakness in your hand and fingers. The carpal tunnel is a narrow area located on the palm side of your wrist. Repeated wrist motion or certain diseases may cause swelling within the tunnel. This swelling pinches the main nerve in the wrist. The main nerve in the wrist is called the median nerve. What are the causes? This condition may be caused by:  Repeated and forceful wrist and hand motions.  Wrist injuries.  Arthritis.  A cyst or tumor in the carpal tunnel.  Fluid buildup during pregnancy.  Use of tools that vibrate. Sometimes the cause of this condition is not known. What increases the risk? The following factors may make you more likely to develop this condition:  Having a job that requires you to repeatedly or forcefully move your wrist or hand or requires you to use tools that vibrate. This may include jobs that involve using computers, working on an [employer], or working with [place] such as Pension scheme manager.  Being a woman.  Having certain conditions, such as: ? Diabetes. ? Obesity. ? An underactive thyroid (hypothyroidism). ? Kidney failure. ? Rheumatoid arthritis. What are the signs or symptoms? Symptoms of this condition include:  A tingling feeling in your  fingers, especially in your thumb, index, and middle fingers.  Tingling or numbness in your hand.  An aching feeling in your entire arm, especially when your wrist and elbow are bent for a long time.  Wrist pain that goes up your arm to your shoulder.  Pain that goes down into your palm or fingers.  A weak feeling in your hands. You may have trouble grabbing and holding items. Your symptoms may feel worse during the night. How is this diagnosed? This condition is diagnosed with a medical history and physical exam. You may also have tests, including:  Electromyogram (EMG). This test measures electrical signals sent by your nerves into the muscles.  Nerve conduction study. This test measures how well electrical signals pass through your nerves.  Imaging tests, such as X-rays, ultrasound, and MRI. These tests check for possible causes of your condition. How is this treated? This condition may be treated with:  Lifestyle changes. It is important to stop or change the activity that caused your condition.  Doing exercise and activities to strengthen and stretch your muscles and tendons (physical therapy).  Making lifestyle changes to help with your condition and learning how to do your daily activities safely (occupational therapy).  Medicines for pain and inflammation. This may include medicine that is injected into your wrist.  A wrist splint or brace.  Surgery. Follow these instructions at home: If you have a splint or brace:  Wear the splint or brace as told by your health care provider. Remove it only as told by your health care provider.  Loosen the splint or brace if your fingers tingle, become numb, or turn cold and blue.  Keep the splint or brace clean.  If the splint or brace is not waterproof: ? Do not let it get wet. ? Cover it with a watertight covering when you take a bath or shower. Managing pain, stiffness, and swelling If directed, put ice on the painful  area. To do this:  If you have a removeable splint or brace, remove it as told by your health care provider.  Put ice in a plastic bag.  Place a towel between your skin and the bag or between the splint or brace and the bag.  Leave the ice on for 20 minutes, 2-3 times a day. Do not fall asleep with the cold pack on your skin.  Remove the ice if your skin turns bright red. This is very important. If you cannot feel pain, heat, or cold, you have a greater risk of damage to the area. Move your fingers often to reduce stiffness and swelling.   General instructions  Take over-the-counter and prescription medicines only as told by your health care provider.  Rest your wrist and hand from any activity that may be causing your pain. If your condition is work related, talk with your employer about changes that can be made, such as getting a wrist pad to use while typing.  Do any exercises as told by your health care provider, physical therapist, or occupational therapist.  Keep all follow-up visits. This is important. Contact a health care provider if:  You have new symptoms.  Your pain is not controlled with medicines.  Your symptoms get worse. Get help right away if:  You have severe numbness or tingling in your wrist or hand. Summary  Carpal tunnel syndrome is a condition that causes pain, numbness, and weakness in your hand and fingers.  It is usually caused by repeated wrist motions.  Lifestyle changes and medicines are used to treat carpal tunnel syndrome. Surgery may be recommended.  Follow your health care provider's instructions about wearing a splint, resting from activity, keeping follow-up visits, and calling for help. This information is not intended to replace advice given to you by your health care provider. Make sure you discuss any questions you have with your health care provider. Document Revised: 02/16/2020 Document Reviewed: 02/16/2020 Elsevier Patient Education   [place].

## 2021-02-25 NOTE — Progress Notes (Signed)
New Patient Office Visit  Subjective:  Patient ID: Bridget Butler, female    DOB: [birth year]  Age: 75 y.o. MRN: [id number]  CC:  Chief Complaint  Patient presents with  . Hypertension    HPI Bridget Butler states that she has been out of her HTN meds for the past month due to her provider leaving their practice, states that it is well controlled when she has her medication.  States that she has been having numbness in the morning in her right hand, worse in the morning states that this has been ongoing for the past few months .  States that she is right handed, denies injury/ trauma.  Hs not tried anything for relief.  States that she is only taking 14 units of insulin in the AM, states that she has PCP appt in July 18, 22    Past Medical History:  Diagnosis Date  . Cataract   . Diabetes mellitus without complication ([place])   . Dyspnea    on exertion  . GERD (gastroesophageal reflux disease)   . Hyperlipidemia   . Hypertension   . Multiple thyroid nodules   . Thyroid disease     Past Surgical History:  Procedure Laterality Date  . CHOLECYSTECTOMY    . COSMETIC SURGERY Bilateral    eyelids  . EYE SURGERY    . GALLBLADDER SURGERY    . MICROLARYNGOSCOPY WITH CO2 LASER AND EXCISION OF VOCAL CORD LESION N/A 04/02/2020   Procedure: MICROLARYNGOSCOPY WITH DILATION AND CO2 LASER AND EXCISION OF VOCAL CORD LESION w/MITOMYCIN C;  Surgeon: Melida Quitter, MD;  Location: [facility];  Service: ENT;  Laterality: N/A;  . MICROLARYNGOSCOPY WITH DILATION N/A 05/23/2020   Procedure: MICROLARYNGOSCOPY W/ DILATION AND JET VENTILATION;  Surgeon: Melida Quitter, MD;  Location: [place];  Service: ENT;  Laterality: N/A;  . MICROLARYNGOSCOPY WITH DILATION N/A 07/13/2020   Procedure: MICROLARYNGOSCOPY with Jet Ventilation;  Surgeon: Melida Quitter, MD;  Location: [place];  Service: ENT;  Laterality: N/A;  . TRACHEOSTOMY TUBE PLACEMENT N/A 04/02/2020   Procedure: Awake TRACHEOSTOMY;  Surgeon: Melida Quitter, MD;   Location[facility];  Service: ENT;  Laterality: N/A;  . TUBAL LIGATION      Family History  Problem Relation Age of Onset  . Heart disease Mother   . Hypertension Mother   . Cancer Sister   . Diabetes Sister     Social History   Socioeconomic History  . Marital status: Widowed    Spouse name: Not on file  . Number of children: 3  . Years of education: Not on file  . Highest education level: Not on file  Occupational History  . Not on file  Tobacco Use  . Smoking status: Never Smoker  . Smokeless tobacco: Never Used  Vaping Use  . Vaping Use: Never used  Substance and Sexual Activity  . Alcohol use: No  . Drug use: No  . Sexual activity: Not Currently  Other Topics Concern  . Not on file  Social History Narrative   Bridget Butler is a 75 year old Jehovah witness patient    She receives support from her daughter primarily who also assists with transportation to medical appointments.    She is presently independent with her ADLs and iADLs      Social Determinants of Health   Financial Resource Strain: Not on file  Food Insecurity: Not on file  Transportation Needs: Not on file  Physical Activity: Not on file  Stress:  Not on file  Social Connections: Not on file  Intimate Partner Violence: Not on file    ROS Review of Systems  Constitutional: Negative.   HENT: Negative.   Eyes: Negative.   Respiratory: Negative for shortness of breath.   Cardiovascular: Negative for chest pain.  Endocrine: Negative.   Genitourinary: Negative.   Musculoskeletal: Negative.   Skin: Negative.   Allergic/Immunologic: Negative.   Neurological: Negative for headaches.  Hematological: Negative.   Psychiatric/Behavioral: Negative.     Objective:   Today's Vitals: BP (!) 160/61 (BP Location: Left Arm, Patient Position: Sitting, Cuff Size: Normal)   Pulse 89   Temp 98.2 F (36.8 C) (Oral)   Resp 18   Ht 5\' 3"  (1.6 m)   Wt 188 lb (85.3 kg)   SpO2 100%   BMI 33.30 kg/m   Physical  Exam Vitals and nursing note reviewed.  Constitutional:      Appearance: Normal appearance.  HENT:     Head: Normocephalic and atraumatic.     Right Ear: External ear normal.     Left Ear: External ear normal.     Nose: Nose normal.     Mouth/Throat:     Mouth: Mucous membranes are moist.     Pharynx: Oropharynx is clear.  Eyes:     Extraocular Movements: Extraocular movements intact.     Conjunctiva/sclera: Conjunctivae normal.     Pupils: Pupils are equal, round, and reactive to light.  Cardiovascular:     Rate and Rhythm: Normal rate and regular rhythm.     Pulses: Normal pulses.     Heart sounds: Normal heart sounds.  Pulmonary:     Effort: Pulmonary effort is normal.     Breath sounds: Normal breath sounds.  Musculoskeletal:        General: Normal range of motion.     Right hand: Normal. No tenderness. Normal range of motion.     Left hand: Normal.     Cervical back: Normal range of motion and neck supple.  Skin:    General: Skin is warm and dry.  Neurological:     General: No focal deficit present.     Mental Status: She is alert and oriented to person, place, and time.  Psychiatric:        Mood and Affect: Mood normal.        Behavior: Behavior normal.        Thought Content: Thought content normal.        Judgment: Judgment normal.     Assessment & Plan:   Problem List Items Addressed This Visit      Cardiovascular and Mediastinum   Essential hypertension - Primary   Relevant Medications   losartan-hydrochlorothiazide (HYZAAR) 50-12.5 MG tablet    Other Visit Diagnoses    Vitamin D deficiency       Carpal tunnel syndrome of right wrist          Outpatient Encounter Medications as of 02/25/2021  Medication Sig  . aspirin EC 81 MG tablet Take 81 mg by mouth daily.  . Blood Glucose Monitoring Suppl (TRUE METRIX METER) DEVI 1 Device by Does not apply route 2 (two) times daily.  . cholecalciferol (VITAMIN D) 25 MCG (1000 UNIT) tablet Take 1,000 Units by  mouth at bedtime.  . Dulaglutide (TRULICITY) 1.5 JM/4.2AS SOPN Inject 0.5 mLs (1.5 mg total) into the skin once a week.  [name] Kitchen glucose blood (TRUE METRIX BLOOD GLUCOSE TEST) test strip Use as instructed to check  blood sugar 1 daily  . Insulin Glargine (BASAGLAR KWIKPEN) 100 UNIT/ML SOPN Inject 0.2 mLs (20 Units total) into the skin at bedtime. (Patient taking differently: Inject 20 Units into the skin daily.)  . Insulin Pen Needle (PEN NEEDLES) 32G X 5 MM MISC 1 Units by Does not apply route daily.  . metFORMIN (GLUCOPHAGE-XR) 500 MG 24 hr tablet Take 2 tablets (1,000 mg total) by mouth daily with supper.  [name] Kitchen omeprazole (PRILOSEC) 40 MG capsule Take 40 mg by mouth in the morning and at bedtime.   . pravastatin (PRAVACHOL) 40 MG tablet Take 1 tablet (40 mg total) by mouth at bedtime.  . Tetrahydrozoline HCl (VISINE OP) Apply 1 drop to eye daily as needed (dry eyes).  . [DISCONTINUED] losartan-hydrochlorothiazide (HYZAAR) 50-12.5 MG tablet Take 1 tablet by mouth daily.  [name] Kitchen losartan-hydrochlorothiazide (HYZAAR) 50-12.5 MG tablet Take 1 tablet by mouth daily.   No facility-administered encounter medications on file as of 02/25/2021.   1. Essential hypertension Resume current medication.  Patient encouraged to check blood pressure on a daily basis, keep a written log and have available for all office visits.  Red flags given for prompt reevaluation. - losartan-hydrochlorothiazide (HYZAAR) 50-12.5 MG tablet; Take 1 tablet by mouth daily.  Dispense: 90 tablet; Refill: 3  2. Vitamin D deficiency Encourage daily over-the-counter vitamin D supplementation  3. Carpal tunnel syndrome of right wrist Patient education given on bracing, gentle stretching, over-the-counter topical pain creams.   I have reviewed the patient's medical history (PMH, PSH, Social History, Family History, Medications, and allergies) , and have been updated if relevant. I spent 32 minutes reviewing chart and  face to face time with  patient.    Follow-up: Return if symptoms worsen or fail to improve.   Loraine Grip Mayers, PA-C

## 2021-02-25 NOTE — Progress Notes (Signed)
Patient request a refill on Losartan. Patient has been out for the past month. Patient denies any pain at this time. Patient has eaten today.

## 2021-02-26 DIAGNOSIS — G5601 Carpal tunnel syndrome, right upper limb: Secondary | ICD-10-CM | POA: Insufficient documentation

## 2021-02-26 DIAGNOSIS — E559 Vitamin D deficiency, unspecified: Secondary | ICD-10-CM | POA: Insufficient documentation

## 2021-03-09 ENCOUNTER — Other Ambulatory Visit: Payer: Self-pay

## 2021-03-09 ENCOUNTER — Emergency Department (HOSPITAL_COMMUNITY)
Admission: EM | Admit: 2021-03-09 | Discharge: 2021-03-09 | Disposition: A | Discharge status: Home / Self Care | Payer: No Typology Code available for payment source | Attending: Emergency Medicine | Admitting: Emergency Medicine

## 2021-03-09 DIAGNOSIS — Z794 Long term (current) use of insulin: Secondary | ICD-10-CM | POA: Diagnosis not present

## 2021-03-09 DIAGNOSIS — I10 Essential (primary) hypertension: Secondary | ICD-10-CM | POA: Diagnosis not present

## 2021-03-09 DIAGNOSIS — Z7984 Long term (current) use of oral hypoglycemic drugs: Secondary | ICD-10-CM | POA: Insufficient documentation

## 2021-03-09 DIAGNOSIS — R519 Headache, unspecified: Secondary | ICD-10-CM | POA: Diagnosis not present

## 2021-03-09 DIAGNOSIS — Z79899 Other long term (current) drug therapy: Secondary | ICD-10-CM | POA: Diagnosis not present

## 2021-03-09 DIAGNOSIS — Z7982 Long term (current) use of aspirin: Secondary | ICD-10-CM | POA: Diagnosis not present

## 2021-03-09 DIAGNOSIS — W2210XA Striking against or struck by unspecified automobile airbag, initial encounter: Secondary | ICD-10-CM

## 2021-03-09 DIAGNOSIS — S5012XA Contusion of left forearm, initial encounter: Secondary | ICD-10-CM | POA: Diagnosis not present

## 2021-03-09 DIAGNOSIS — S5011XA Contusion of right forearm, initial encounter: Secondary | ICD-10-CM | POA: Insufficient documentation

## 2021-03-09 DIAGNOSIS — Y92410 Unspecified street and highway as the place of occurrence of the external cause: Secondary | ICD-10-CM | POA: Insufficient documentation

## 2021-03-09 DIAGNOSIS — E119 Type 2 diabetes mellitus without complications: Secondary | ICD-10-CM | POA: Insufficient documentation

## 2021-03-09 DIAGNOSIS — S59912A Unspecified injury of left forearm, initial encounter: Secondary | ICD-10-CM | POA: Diagnosis present

## 2021-03-09 DIAGNOSIS — S5010XA Contusion of unspecified forearm, initial encounter: Secondary | ICD-10-CM

## 2021-03-09 MED ORDER — METHOCARBAMOL 500 MG PO TABS: 500.0000 mg | ORAL_TABLET | Freq: Four times a day (QID) | ORAL | 0 refills | Status: DC | PRN
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
## 2021-03-09 NOTE — ED Notes (Signed)
All d/c instuctions reviewed with pt using teachback method. Pt denies any further questions. No further care needed at this time
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
## 2021-03-27 DIAGNOSIS — H3582 Retinal ischemia: Secondary | ICD-10-CM | POA: Diagnosis not present

## 2021-03-27 DIAGNOSIS — E113593 Type 2 diabetes mellitus with proliferative diabetic retinopathy without macular edema, bilateral: Secondary | ICD-10-CM | POA: Diagnosis not present

## 2021-03-27 DIAGNOSIS — H43811 Vitreous degeneration, right eye: Secondary | ICD-10-CM | POA: Diagnosis not present

## 2021-05-01 ENCOUNTER — Encounter: Payer: Self-pay | Admitting: Internal Medicine

## 2021-05-01 ENCOUNTER — Ambulatory Visit (INDEPENDENT_AMBULATORY_CARE_PROVIDER_SITE_OTHER): Payer: Medicare HMO | Admitting: Internal Medicine

## 2021-05-01 ENCOUNTER — Other Ambulatory Visit: Payer: Self-pay

## 2021-05-01 VITALS — BP 166/86 | HR 85 | Temp 98.4°F | Resp 16 | Ht 63.0 in | Wt 186.0 lb

## 2021-05-01 DIAGNOSIS — Z0001 Encounter for general adult medical examination with abnormal findings: Secondary | ICD-10-CM | POA: Diagnosis not present

## 2021-05-01 DIAGNOSIS — Z1231 Encounter for screening mammogram for malignant neoplasm of breast: Secondary | ICD-10-CM

## 2021-05-01 DIAGNOSIS — E559 Vitamin D deficiency, unspecified: Secondary | ICD-10-CM | POA: Diagnosis not present

## 2021-05-01 DIAGNOSIS — Z23 Encounter for immunization: Secondary | ICD-10-CM

## 2021-05-01 DIAGNOSIS — D539 Nutritional anemia, unspecified: Secondary | ICD-10-CM | POA: Insufficient documentation

## 2021-05-01 DIAGNOSIS — E1122 Type 2 diabetes mellitus with diabetic chronic kidney disease: Secondary | ICD-10-CM | POA: Diagnosis not present

## 2021-05-01 DIAGNOSIS — I10 Essential (primary) hypertension: Secondary | ICD-10-CM | POA: Diagnosis not present

## 2021-05-01 DIAGNOSIS — N1831 Chronic kidney disease, stage 3a: Secondary | ICD-10-CM | POA: Diagnosis not present

## 2021-05-01 DIAGNOSIS — E785 Hyperlipidemia, unspecified: Secondary | ICD-10-CM

## 2021-05-01 DIAGNOSIS — K219 Gastro-esophageal reflux disease without esophagitis: Secondary | ICD-10-CM | POA: Diagnosis not present

## 2021-05-01 LAB — BASIC METABOLIC PANEL
BUN: 18 mg/dL (ref 6–23)
CO2: 30 mEq/L (ref 19–32)
Calcium: 10.1 mg/dL (ref 8.4–10.5)
Chloride: 100 mEq/L (ref 96–112)
Creatinine, Ser: 1.04 mg/dL (ref 0.40–1.20)
GFR: 52.76 mL/min — ABNORMAL LOW (ref >60.00)
Glucose, Bld: 125 mg/dL — ABNORMAL HIGH (ref 70–99)
Potassium: 3.8 mEq/L (ref 3.5–5.1)
Sodium: 137 mEq/L (ref 135–145)

## 2021-05-01 LAB — HEPATIC FUNCTION PANEL
ALT: 18 U/L (ref 0–35)
AST: 19 U/L (ref 0–37)
Albumin: 4.1 g/dL (ref 3.5–5.2)
Alkaline Phosphatase: 69 U/L (ref 39–117)
Bilirubin, Direct: 0.1 mg/dL (ref 0.0–0.3)
Total Bilirubin: 0.4 mg/dL (ref 0.2–1.2)
Total Protein: 7.5 g/dL (ref 6.0–8.3)

## 2021-05-01 LAB — MICROALBUMIN / CREATININE URINE RATIO
Creatinine,U: 63 mg/dL
Microalb Creat Ratio: 1.6 mg/g (ref 0.0–30.0)
Microalb, Ur: 1 mg/dL (ref 0.0–1.9)

## 2021-05-01 LAB — LIPID PANEL
Cholesterol: 178 mg/dL (ref 0–200)
HDL: 63.5 mg/dL (ref >39.00)
LDL Cholesterol: 91 mg/dL (ref 0–99)
NonHDL: 114.8
Total CHOL/HDL Ratio: 3
Triglycerides: 118 mg/dL (ref 0.0–149.0)
VLDL: 23.6 mg/dL (ref 0.0–40.0)

## 2021-05-01 LAB — VITAMIN B12: Vitamin B-12: 369 pg/mL (ref 211–911)

## 2021-05-01 LAB — VITAMIN D 25 HYDROXY (VIT D DEFICIENCY, FRACTURES): VITD: 37.63 ng/mL (ref 30.00–100.00)

## 2021-05-01 LAB — FERRITIN: Ferritin: 70.3 ng/mL (ref 10.0–291.0)

## 2021-05-01 LAB — HEMOGLOBIN A1C: Hgb A1c MFr Bld: 7.7 % — ABNORMAL HIGH (ref 4.6–6.5)

## 2021-05-01 LAB — FOLATE: Folate: >24.4 ng/mL (ref >5.9)

## 2021-05-01 LAB — IRON: Iron: 60 ug/dL (ref 42–145)

## 2021-05-01 MED ORDER — NEBIVOLOL HCL 5 MG PO TABS: 5.0000 mg | ORAL_TABLET | Freq: Every day | ORAL | 0 refills | Status: DC

## 2021-05-01 MED ORDER — LOSARTAN POTASSIUM-HCTZ 50-12.5 MG PO TABS: 1.0000 | ORAL_TABLET | Freq: Every day | ORAL | 0 refills | Status: DC

## 2021-05-01 MED ORDER — PRAVASTATIN SODIUM 40 MG PO TABS: 40.0000 mg | ORAL_TABLET | Freq: Every day | ORAL | 1 refills | Status: DC

## 2021-05-01 MED ORDER — OMEPRAZOLE 40 MG PO CPDR: 40.0000 mg | DELAYED_RELEASE_CAPSULE | Freq: Two times a day (BID) | ORAL | 1 refills | Status: DC

## 2021-05-01 NOTE — Patient Instructions (Signed)

## 2021-05-01 NOTE — Progress Notes (Signed)
Subjective:  Patient ID: Bridget Butler, female    DOB: 1946-09-18  Age: 75 y.o. MRN: [id number]  CC: Annual Exam, Hypertension, Hyperlipidemia, Diabetes, and Anemia  This visit occurred during the SARS-CoV-2 public health emergency.  Safety protocols were in place, including screening questions prior to the visit, additional usage of staff PPE, and extensive cleaning of exam room while observing appropriate contact time as indicated for disinfecting solutions.    HPI Bridget Butler presents for a CPX. F/up, and to establish.  She complains of chronic numbness in her feet and weight gain.  She is active and denies CP, DOE, palpitations, edema, fatigue, diaphoresis, or edema.  History Bridget Butler has a past medical history of Cataract, Diabetes mellitus without complication ([place]), Dyspnea, GERD (gastroesophageal reflux disease), Hyperlipidemia, Hypertension, Multiple thyroid nodules, and Thyroid disease.   She has a past surgical history that includes Tubal ligation; Cosmetic surgery (Bilateral); Gallbladder surgery; Tracheostomy tube placement (N/A, 04/02/2020); Microlaryngoscopy with co2 laser and excision of vocal cord lesion (N/A, 04/02/2020); Eye surgery; Cholecystectomy; Microlaryngoscopy with dilation (N/A, 05/23/2020); and Microlaryngoscopy with dilation (N/A, 07/13/2020).   Her family history includes Cancer in her sister; Diabetes in her sister; Heart disease in her mother; Hypertension in her mother.She reports that she has never smoked. She has never used smokeless tobacco. She reports that she does not drink alcohol and does not use drugs.  Outpatient Medications Prior to Visit  Medication Sig Dispense Refill   Blood Glucose Monitoring Suppl (TRUE METRIX METER) DEVI 1 Device by Does not apply route 2 (two) times daily. 1 Device 0   glucose blood (TRUE METRIX BLOOD GLUCOSE TEST) test strip Use as instructed to check blood sugar 1 daily 100 each 2   Insulin Pen Needle (PEN NEEDLES) 32G X 5 MM  MISC 1 Units by Does not apply route daily. 100 each 5   metFORMIN (GLUCOPHAGE-XR) 500 MG 24 hr tablet Take 2 tablets (1,000 mg total) by mouth daily with supper. 60 tablet 11   Tetrahydrozoline HCl (VISINE OP) Apply 1 drop to eye daily as needed (dry eyes).     aspirin EC 81 MG tablet Take 81 mg by mouth daily.     cholecalciferol (VITAMIN D) 25 MCG (1000 UNIT) tablet Take 1,000 Units by mouth at bedtime.     Dulaglutide (TRULICITY) 1.5 ZD/6.3OV SOPN Inject 0.5 mLs (1.5 mg total) into the skin once a week. 2 mL 5   Insulin Glargine (BASAGLAR KWIKPEN) 100 UNIT/ML SOPN Inject 0.2 mLs (20 Units total) into the skin at bedtime. (Patient taking differently: Inject 20 Units into the skin daily.) 45 mL 3   losartan-hydrochlorothiazide (HYZAAR) 50-12.5 MG tablet Take 1 tablet by mouth daily. 90 tablet 3   methocarbamol (ROBAXIN) 500 MG tablet Take 1 tablet (500 mg total) by mouth every 6 (six) hours as needed for muscle spasms. 20 tablet 0   omeprazole (PRILOSEC) 40 MG capsule Take 40 mg by mouth in the morning and at bedtime.      pravastatin (PRAVACHOL) 40 MG tablet Take 1 tablet (40 mg total) by mouth at bedtime. 30 tablet 0   No facility-administered medications prior to visit.    ROS Review of Systems  Constitutional:  Positive for unexpected weight change (wt gain). Negative for diaphoresis and fatigue.  HENT: Negative.    Eyes: Negative.   Respiratory:  Negative for chest tightness, shortness of breath and wheezing.   Cardiovascular:  Negative for chest pain, palpitations and leg swelling.  Gastrointestinal:  Negative for abdominal pain, constipation, diarrhea, nausea and vomiting.  Endocrine: Negative.   Genitourinary: Negative.  Negative for difficulty urinating and dysuria.  Musculoskeletal:  Negative for arthralgias and myalgias.  Skin: Negative.  Negative for color change and pallor.  Neurological:  Positive for numbness. Negative for dizziness, speech difficulty, weakness and  light-headedness.       Numbness/tingling in feet  Hematological:  Negative for adenopathy. Does not bruise/bleed easily.  Psychiatric/Behavioral: Negative.     Objective:  BP (!) 166/86 (BP Location: Right Arm, Patient Position: Sitting, Cuff Size: Large)   Pulse 85   Temp 98.4 F (36.9 C) (Oral)   Resp 16   Ht 5\' 3"  (1.6 m)   Wt 186 lb (84.4 kg)   SpO2 97%   BMI 32.95 kg/m   Physical Exam Vitals reviewed.  Constitutional:      Appearance: Normal appearance.  HENT:     Nose: Nose normal.     Mouth/Throat:     Mouth: Mucous membranes are moist.  Eyes:     General: No scleral icterus.    Conjunctiva/sclera: Conjunctivae normal.  Cardiovascular:     Rate and Rhythm: Normal rate and regular rhythm.     Pulses: Normal pulses.     Heart sounds: No murmur heard.    Comments: EKG- NSR, 80 bpm Normal EKG Pulmonary:     Effort: Pulmonary effort is normal.     Breath sounds: No stridor. No wheezing, rhonchi or rales.  Abdominal:     General: Abdomen is flat.     Palpations: There is no mass.     Tenderness: There is no abdominal tenderness. There is no guarding.     Hernia: No hernia is present.  Musculoskeletal:     Cervical back: Neck supple.     Right lower leg: No edema.     Left lower leg: No edema.  Skin:    General: Skin is warm and dry.     Findings: No rash.  Neurological:     General: No focal deficit present.     Mental Status: She is alert and oriented to person, place, and time. Mental status is at baseline.  Psychiatric:        Mood and Affect: Mood normal.        Behavior: Behavior normal.    Lab Results  Component Value Date   WBC 6.0 05/01/2021   HGB 10.5 (L) 05/01/2021   HCT 31.9 (L) 05/01/2021   PLT 285.0 05/01/2021   GLUCOSE 125 (H) 05/01/2021   CHOL 178 05/01/2021   TRIG 118.0 05/01/2021   HDL 63.50 05/01/2021   LDLCALC 91 05/01/2021   ALT 18 05/01/2021   AST 19 05/01/2021   NA 137 05/01/2021   K 3.8 05/01/2021   CL 100 05/01/2021    CREATININE 1.04 05/01/2021   BUN 18 05/01/2021   CO2 30 05/01/2021   TSH 0.38 01/14/2021   HGBA1C 7.7 (H) 05/01/2021   MICROALBUR 1.0 05/01/2021     Assessment & Plan:   Bridget Butler was seen today for annual exam, hypertension, hyperlipidemia, diabetes and anemia.  Diagnoses and all orders for this visit:  Gastroesophageal reflux disease without esophagitis- Her symptoms are well controlled. -     omeprazole (PRILOSEC) 40 MG capsule; Take 1 capsule (40 mg total) by mouth in the morning and at bedtime.  Essential hypertension- Her blood pressure is not adequately well controlled.  Will add nebivolol to the ARB and thiazide diuretic. -  losartan-hydrochlorothiazide (HYZAAR) 50-12.5 MG tablet; Take 1 tablet by mouth daily. -     CBC with Differential/Platelet; Future -     Basic metabolic panel; Future -     Hepatic function panel; Future -     Urinalysis, Routine w reflex microscopic; Future -     EKG 12-Lead -     nebivolol (BYSTOLIC) 5 MG tablet; Take 1 tablet (5 mg total) by mouth daily. (Patient not taking: Reported on 05/06/2021) -     CBC with Differential/Platelet -     Basic metabolic panel -     Hepatic function panel -     Urinalysis, Routine w reflex microscopic  Type 2 diabetes mellitus with stage 3a chronic kidney disease, without long-term current use of insulin ([place])- Her A1c is at 7.7% and she has renal insufficiency.  I recommended that she take [place] for renal protection. -     Basic metabolic panel; Future -     Hemoglobin A1c; Future -     Microalbumin / creatinine urine ratio; Future -     Basic metabolic panel -     Microalbumin / creatinine urine ratio -     Hemoglobin A1c -     Discontinue: dapagliflozin propanediol (FARXIGA) 10 MG TABS tablet; Take 1 tablet (10 mg total) by mouth daily before breakfast. (Patient not taking: Reported on 05/06/2021)  Deficiency anemia- She is mildly anemic but her vitamin levels are normal.  This is consistent with the anemia  of chronic disease. -     CBC with Differential/Platelet; Future -     Vitamin B12; Future -     Iron; Future -     Vitamin B1; Future -     Folate; Future -     Ferritin; Future -     Reticulocytes; Future -     Reticulocytes -     Folate -     Ferritin -     CBC with Differential/Platelet -     Vitamin B12 -     Iron -     Vitamin B1  Stage 3a chronic kidney disease ([place])- I recommended that she start taking Farxiga for renal protection. -     VITAMIN D 25 Hydroxy (Vit-D Deficiency, Fractures); Future -     VITAMIN D 25 Hydroxy (Vit-D Deficiency, Fractures) -     Discontinue: dapagliflozin propanediol (FARXIGA) 10 MG TABS tablet; Take 1 tablet (10 mg total) by mouth daily before breakfast. (Patient not taking: Reported on 05/06/2021)  Visit for screening mammogram -     MM DIGITAL SCREENING BILATERAL; Future  Hyperlipidemia LDL goal <100- LDL goal achieved. Doing well on the statin  -     Lipid panel; Future -     pravastatin (PRAVACHOL) 40 MG tablet; Take 1 tablet (40 mg total) by mouth at bedtime. -     Lipid panel  Vitamin D deficiency -     VITAMIN D 25 Hydroxy (Vit-D Deficiency, Fractures); Future -     VITAMIN D 25 Hydroxy (Vit-D Deficiency, Fractures) -     Cholecalciferol 50 MCG (2000 UT) TABS; Take 1 tablet (2,000 Units total) by mouth daily.  Other orders -     Pneumococcal polysaccharide vaccine 23-valent greater than or equal to 2yo subcutaneous/IM  I have discontinued Annayah P. Trew's aspirin EC, cholecalciferol, and methocarbamol. I have also changed her omeprazole. Additionally, I am having her start on nebivolol and Cholecalciferol. Lastly, I am having her maintain her Pen Needles,  True Metrix Meter, Tetrahydrozoline HCl (VISINE OP), metFORMIN, True Metrix Blood Glucose Test, losartan-hydrochlorothiazide, and pravastatin.  Meds ordered this encounter  Medications   omeprazole (PRILOSEC) 40 MG capsule    Sig: Take 1 capsule (40 mg total) by mouth in the  morning and at bedtime.    Dispense:  90 capsule    Refill:  1   losartan-hydrochlorothiazide (HYZAAR) 50-12.5 MG tablet    Sig: Take 1 tablet by mouth daily.    Dispense:  90 tablet    Refill:  0   pravastatin (PRAVACHOL) 40 MG tablet    Sig: Take 1 tablet (40 mg total) by mouth at bedtime.    Dispense:  90 tablet    Refill:  1   nebivolol (BYSTOLIC) 5 MG tablet    Sig: Take 1 tablet (5 mg total) by mouth daily.    Dispense:  90 tablet    Refill:  0   DISCONTD: dapagliflozin propanediol (FARXIGA) 10 MG TABS tablet    Sig: Take 1 tablet (10 mg total) by mouth daily before breakfast.    Dispense:  90 tablet    Refill:  1   Cholecalciferol 50 MCG (2000 UT) TABS    Sig: Take 1 tablet (2,000 Units total) by mouth daily.    Dispense:  90 tablet    Refill:  1     Follow-up: Return in about 3 months (around 08/01/2021).  Scarlette Calico, MD

## 2021-05-02 LAB — URINALYSIS, ROUTINE W REFLEX MICROSCOPIC
Bilirubin Urine: NEGATIVE
Hgb urine dipstick: NEGATIVE
Ketones, ur: NEGATIVE
Nitrite: NEGATIVE
Specific Gravity, Urine: 1.01 (ref 1.000–1.030)
Total Protein, Urine: NEGATIVE
Urine Glucose: NEGATIVE
Urobilinogen, UA: 0.2 (ref 0.0–1.0)
pH: 6 (ref 5.0–8.0)

## 2021-05-02 LAB — CBC WITH DIFFERENTIAL/PLATELET
Basophils Absolute: 0.1 10*3/uL (ref 0.0–0.1)
Basophils Relative: 1.2 % (ref 0.0–3.0)
Eosinophils Absolute: 0.1 10*3/uL (ref 0.0–0.7)
Eosinophils Relative: 0.9 % (ref 0.0–5.0)
HCT: 31.9 % — ABNORMAL LOW (ref 36.0–46.0)
Hemoglobin: 10.5 g/dL — ABNORMAL LOW (ref 12.0–15.0)
Lymphocytes Relative: 51.1 % — ABNORMAL HIGH (ref 12.0–46.0)
Lymphs Abs: 3.1 10*3/uL (ref 0.7–4.0)
MCHC: 32.9 g/dL (ref 30.0–36.0)
MCV: 96.1 fl (ref 78.0–100.0)
Monocytes Absolute: 0.4 10*3/uL (ref 0.1–1.0)
Monocytes Relative: 7 % (ref 3.0–12.0)
Neutro Abs: 2.4 10*3/uL (ref 1.4–7.7)
Neutrophils Relative %: 39.8 % — ABNORMAL LOW (ref 43.0–77.0)
Platelets: 285 10*3/uL (ref 150.0–400.0)
RBC: 3.32 Mil/uL — ABNORMAL LOW (ref 3.87–5.11)
RDW: 14 % (ref 11.5–15.5)
WBC: 6 10*3/uL (ref 4.0–10.5)

## 2021-05-02 MED ORDER — DAPAGLIFLOZIN PROPANEDIOL 10 MG PO TABS: 10.0000 mg | ORAL_TABLET | Freq: Every day | ORAL | 1 refills | Status: DC

## 2021-05-05 LAB — VITAMIN B1: Vitamin B1 (Thiamine): 24 nmol/L (ref 8–30)

## 2021-05-05 LAB — RETICULOCYTES
ABS Retic: 54080 cells/uL (ref 20000–80000)
Retic Ct Pct: 1.6 %

## 2021-05-05 MED ORDER — CHOLECALCIFEROL 50 MCG (2000 UT) PO TABS: 1.0000 | ORAL_TABLET | Freq: Every day | ORAL | 1 refills | Status: DC

## 2021-05-06 ENCOUNTER — Ambulatory Visit (INDEPENDENT_AMBULATORY_CARE_PROVIDER_SITE_OTHER): Payer: Medicare HMO | Admitting: Internal Medicine

## 2021-05-06 ENCOUNTER — Other Ambulatory Visit: Payer: Self-pay

## 2021-05-06 ENCOUNTER — Encounter: Payer: Self-pay | Admitting: Internal Medicine

## 2021-05-06 VITALS — BP 140/78 | HR 68 | Ht 63.0 in | Wt 185.2 lb

## 2021-05-06 DIAGNOSIS — E042 Nontoxic multinodular goiter: Secondary | ICD-10-CM | POA: Diagnosis not present

## 2021-05-06 DIAGNOSIS — E11319 Type 2 diabetes mellitus with unspecified diabetic retinopathy without macular edema: Secondary | ICD-10-CM | POA: Diagnosis not present

## 2021-05-06 DIAGNOSIS — Z794 Long term (current) use of insulin: Secondary | ICD-10-CM | POA: Diagnosis not present

## 2021-05-06 DIAGNOSIS — E0590 Thyrotoxicosis, unspecified without thyrotoxic crisis or storm: Secondary | ICD-10-CM | POA: Diagnosis not present

## 2021-05-06 DIAGNOSIS — Z0001 Encounter for general adult medical examination with abnormal findings: Secondary | ICD-10-CM | POA: Insufficient documentation

## 2021-05-06 MED ORDER — BASAGLAR KWIKPEN 100 UNIT/ML ~~LOC~~ SOPN: 16.0000 [IU] | PEN_INJECTOR | Freq: Every day | SUBCUTANEOUS | 3 refills | Status: DC

## 2021-05-06 MED ORDER — TRULICITY 1.5 MG/0.5ML ~~LOC~~ SOAJ: 1.5000 mg | SUBCUTANEOUS | 3 refills | Status: DC

## 2021-05-06 NOTE — Patient Instructions (Addendum)
Please continue: - Metformin ER 1000 mg at bedtime. - Basaglar 16 units in am  Please increase: - Trulicity to 1.5 mg weekly   Please return in 4 months with your sugar log.
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
## 2021-07-02 ENCOUNTER — Ambulatory Visit: Payer: Medicare HMO | Admitting: Internal Medicine

## 2021-07-03 ENCOUNTER — Encounter: Payer: Self-pay | Admitting: Internal Medicine

## 2021-07-03 ENCOUNTER — Ambulatory Visit: Payer: Medicare HMO

## 2021-07-03 DIAGNOSIS — Z1231 Encounter for screening mammogram for malignant neoplasm of breast: Secondary | ICD-10-CM | POA: Diagnosis not present

## 2021-07-08 ENCOUNTER — Telehealth: Payer: Self-pay

## 2021-07-08 ENCOUNTER — Ambulatory Visit: Payer: Medicare HMO

## 2021-07-08 NOTE — Progress Notes (Deleted)
Subjective:   Brytani Voth Melchior is a 75 y.o. female who presents for an Initial Medicare Annual Wellness Visit.  Review of Systems    I connected with  Areen Trautner Earll on 07/08/21 by an audio only telemedicine application and verified that I am speaking with the correct Raeven Pint using two identifiers.   I discussed the limitations, risks, security and privacy concerns of performing an evaluation and management service by telephone and the availability of in [facility] appointments. I also discussed with the patient that there may be a patient responsible charge related to this service. The patient expressed understanding and verbally consented to this telephonic visit.  Location of Patient: Home Location of Provider: Office  List any persons and their role that are participating in the visit with the patient.  Norren Adel and [employer], CMA       Objective:    There were no vitals filed for this visit. There is no height or weight on file to calculate BMI.  Advanced Directives 03/09/2021 07/11/2020 05/16/2020 03/29/2020 01/02/2020 06/08/2019 11/01/2018  Does Patient Have a Medical Advance Directive? No No Yes Yes No Yes No  Type of Advance Directive - Public librarian;Living will Healthcare Power of [place];Living will -  Does patient want to make changes to medical advance directive? - - - No - Patient declined Yes (ED - Information included in AVS) No - Patient declined -  Copy of [place] in Chart? - - Yes - validated most recent copy scanned in chart (See row information) Yes - validated most recent copy scanned in chart (See row information) - No - copy requested -  Would patient like information on creating a medical advance directive? No - Patient declined Yes (MAU/Ambulatory/Procedural Areas - Information given) - - Yes (ED - Information included in AVS) - No - Patient declined    Current Medications (verified) Outpatient  Encounter Medications as of 07/08/2021  Medication Sig   Blood Glucose Monitoring Suppl (TRUE METRIX METER) DEVI 1 Device by Does not apply route 2 (two) times daily.   Cholecalciferol 50 MCG (2000 UT) TABS Take 1 tablet (2,000 Units total) by mouth daily.   Dulaglutide (TRULICITY) 1.5 XT/0.6YI SOPN Inject 1.5 mg into the skin once a week.   glucose blood (TRUE METRIX BLOOD GLUCOSE TEST) test strip Use as instructed to check blood sugar 1 daily   Insulin Glargine (BASAGLAR KWIKPEN) 100 UNIT/ML Inject 16-20 Units into the skin at bedtime.   Insulin Pen Needle (PEN NEEDLES) 32G X 5 MM MISC 1 Units by Does not apply route daily.   losartan-hydrochlorothiazide (HYZAAR) 50-12.5 MG tablet Take 1 tablet by mouth daily.   metFORMIN (GLUCOPHAGE-XR) 500 MG 24 hr tablet Take 2 tablets (1,000 mg total) by mouth daily with supper.   nebivolol (BYSTOLIC) 5 MG tablet Take 1 tablet (5 mg total) by mouth daily. (Patient not taking: Reported on 05/06/2021)   omeprazole (PRILOSEC) 40 MG capsule Take 1 capsule (40 mg total) by mouth in the morning and at bedtime.   pravastatin (PRAVACHOL) 40 MG tablet Take 1 tablet (40 mg total) by mouth at bedtime.   Tetrahydrozoline HCl (VISINE OP) Apply 1 drop to eye daily as needed (dry eyes).   No facility-administered encounter medications on file as of 07/08/2021.    Allergies (verified) Patient has no known allergies.   History: Past Medical History:  Diagnosis Date   Cataract    Diabetes mellitus  without complication (HCC)    Dyspnea    on exertion   GERD (gastroesophageal reflux disease)    Hyperlipidemia    Hypertension    Multiple thyroid nodules    Thyroid disease    Past Surgical History:  Procedure Laterality Date   CHOLECYSTECTOMY     COSMETIC SURGERY Bilateral    eyelids   EYE SURGERY     GALLBLADDER SURGERY     MICROLARYNGOSCOPY WITH CO2 LASER AND EXCISION OF VOCAL CORD LESION N/A 04/02/2020   Procedure: MICROLARYNGOSCOPY WITH DILATION AND CO2  LASER AND EXCISION OF VOCAL CORD LESION w/MITOMYCIN C;  Surgeon: Melida Quitter, MD;  Location: [facility];  Service: ENT;  Laterality: N/A;   MICROLARYNGOSCOPY WITH DILATION N/A 05/23/2020   Procedure: MICROLARYNGOSCOPY W/ DILATION AND JET VENTILATION;  Surgeon: Melida Quitter, MD;  Location: [facility];  Service: ENT;  Laterality: N/A;   MICROLARYNGOSCOPY WITH DILATION N/A 07/13/2020   Procedure: MICROLARYNGOSCOPY with Jet Ventilation;  Surgeon: Melida Quitter, MD;  Location: [facility];  Service: ENT;  Laterality: N/A;   TRACHEOSTOMY TUBE PLACEMENT N/A 04/02/2020   Procedure: Awake TRACHEOSTOMY;  Surgeon: Melida Quitter, MD;  Location: [facility];  Service: ENT;  Laterality: N/A;   TUBAL LIGATION     Family History  Problem Relation Age of Onset   Heart disease Mother    Hypertension Mother    Cancer Sister    Diabetes Sister    Social History   Socioeconomic History   Marital status: Widowed    Spouse name: Not on file   Number of children: 3   Years of education: Not on file   Highest education level: Not on file  Occupational History   Not on file  Tobacco Use   Smoking status: Never   Smokeless tobacco: Never  Vaping Use   Vaping Use: Never used  Substance and Sexual Activity   Alcohol use: No   Drug use: No   Sexual activity: Not Currently    Partners: Male  Other Topics Concern   Not on file  Social History Narrative   Mrs Burack is a 75 year old Jehovah witness patient    She receives support from her daughter primarily who also assists with transportation to medical appointments.    She is presently independent with her ADLs and iADLs      Social Determinants of Health   Financial Resource Strain: Not on file  Food Insecurity: Not on file  Transportation Needs: Not on file  Physical Activity: Not on file  Stress: Not on file  Social Connections: Not on file    Tobacco Counseling Counseling given: Not Answered   Clinical Intake:                 Diabetic?yes          Activities of Daily Living In your present state of health, do you have any difficulty performing the following activities: 07/11/2020  Hearing? N  Vision? N  Difficulty concentrating or making decisions? N  Walking or climbing stairs? Y  Dressing or bathing? N  Doing errands, shopping? N  Some recent data might be hidden    Patient Care Team: Janith Lima, MD as PCP - General (Internal Medicine) Sherlynn Stalls, MD as Consulting Physician (Ophthalmology) Philemon Kingdom, MD as Consulting Physician (Internal Medicine)  Indicate any recent Medical Services you may have received from other than [facility] providers in the past year (date may be approximate).     Assessment:  This is a routine wellness examination for [place].  Hearing/Vision screen No results found.  Dietary issues and exercise activities discussed:     Goals Addressed   None   Depression Screen PHQ 2/9 Scores 08/23/2020 04/25/2020 01/02/2020 12/05/2019 06/08/2019 06/03/2019 09/20/2018  PHQ - 2 Score 0 0 0 0 0 0 0    Fall Risk Fall Risk  08/23/2020 06/04/2020 04/25/2020 01/02/2020 12/05/2019  Falls in the past year? 0 0 0 0 0  Number falls in past yr: 0 0 0 0 0  Injury with Fall? 0 0 0 0 0  Risk for fall due to : - - - - -  Follow up Falls evaluation completed - Falls evaluation completed Falls evaluation completed;Education provided -    FALL RISK PREVENTION PERTAINING TO THE HOME:  Any stairs in or around the home? No  If so, are there any without handrails? No  Home free of loose throw rugs in walkways, pet beds, electrical cords, etc? Yes  Adequate lighting in your home to reduce risk of falls? Yes   ASSISTIVE DEVICES UTILIZED TO PREVENT FALLS:  Life alert? {YES/NO:21197} Use of a cane, walker or w/c? {YES/NO:21197} Grab bars in the bathroom? {YES/NO:21197} Shower chair or bench in shower? {YES/NO:21197} Elevated toilet seat or a handicapped toilet? {YES/NO:21197}  TIMED UP AND GO:  Was the test  performed? No .  Length of time to ambulate 10 feet: n/a sec.     Cognitive Function:     6CIT Screen 01/02/2020  What Year? 0 points  What month? 0 points  What time? 0 points  Count back from 20 0 points  Months in reverse 2 points  Repeat phrase 0 points  Total Score 2    Immunizations Immunization History  Administered Date(s) Administered   Fluad Quad(high Dose 65+) 08/23/2020   Influenza, High Dose Seasonal PF 07/27/2017, 07/08/2018, 06/19/2019   PFIZER(Purple Top)SARS-COV-2 Vaccination 11/08/2019, 11/29/2019   Pneumococcal Conjugate-13 09/02/2019   Pneumococcal Polysaccharide-23 05/01/2021    TDAP status: Due, Education has been provided regarding the importance of this vaccine. Advised may receive this vaccine at local pharmacy or Health Dept. Aware to provide a copy of the vaccination record if obtained from local pharmacy or Health Dept. Verbalized acceptance and understanding.  Flu Vaccine status: Due, Education has been provided regarding the importance of this vaccine. Advised may receive this vaccine at local pharmacy or Health Dept. Aware to provide a copy of the vaccination record if obtained from local pharmacy or Health Dept. Verbalized acceptance and understanding.  Pneumococcal vaccine status: Due, Education has been provided regarding the importance of this vaccine. Advised may receive this vaccine at local pharmacy or Health Dept. Aware to provide a copy of the vaccination record if obtained from local pharmacy or Health Dept. Verbalized acceptance and understanding.  Covid-19 vaccine status: Completed vaccines  Qualifies for Shingles Vaccine? Yes   Zostavax completed No   Shingrix Completed?: No.    Education has been provided regarding the importance of this vaccine. Patient has been advised to call insurance company to determine out of pocket expense if they have not yet received this vaccine. Advised may also receive vaccine at local pharmacy or Health  Dept. Verbalized acceptance and understanding.  Screening Tests Health Maintenance  Topic Date Due   TETANUS/TDAP  Never done   Zoster Vaccines- Shingrix (1 of 2) Never done   COVID-19 Vaccine (3 - Booster for Pfizer series) 04/27/2020   OPHTHALMOLOGY EXAM  12/25/2020   INFLUENZA  VACCINE  05/20/2021   FOOT EXAM  08/23/2021   HEMOGLOBIN A1C  11/01/2021   COLONOSCOPY (Pts 45-4yrs Insurance coverage will need to be confirmed)  10/20/2025   DEXA SCAN  Completed   Hepatitis C Screening  Completed   HPV VACCINES  Aged Out    Health Maintenance  Health Maintenance Due  Topic Date Due   TETANUS/TDAP  Never done   Zoster Vaccines- Shingrix (1 of 2) Never done   COVID-19 Vaccine (3 - Booster for Pfizer series) 04/27/2020   OPHTHALMOLOGY EXAM  12/25/2020   INFLUENZA VACCINE  05/20/2021    Colorectal cancer screening: Type of screening: Colonoscopy. Completed 10/21/2015. Repeat every 10 years  Mammogram: not completed  Bone Density: completed 04/19/2018  Lung Cancer Screening: (Low Dose CT Chest recommended if Age 24-80 years, 30 pack-year currently smoking OR have quit w/in 15years.) does qualify.   Lung Cancer Screening Referral: n/a  Additional Screening:  Hepatitis C Screening: does qualify; not completed  Vision Screening: Recommended annual ophthalmology exams for early detection of glaucoma and other disorders of the eye. Is the patient up to date with their annual eye exam?  {YES/NO:21197} Who is the provider or what is the name of the office in which the patient attends annual eye exams? *** If pt is not established with a provider, would they like to be referred to a provider to establish care? {YES/NO:21197}.   Dental Screening: Recommended annual dental exams for proper oral hygiene  Community Resource Referral / Chronic Care Management: CRR required this visit?  No   CCM required this visit?  No      Plan:     I have personally reviewed and noted the  following in the patient's chart:   Medical and social history Use of alcohol, tobacco or illicit drugs  Current medications and supplements including opioid prescriptions. Patient is not currently taking opioid prescriptions. Functional ability and status Nutritional status Physical activity Advanced directives List of other physicians Hospitalizations, surgeries, and ER visits in previous 12 months Vitals Screenings to include cognitive, depression, and falls Referrals and appointments  In addition, I have reviewed and discussed with patient certain preventive protocols, quality metrics, and best practice recommendations. A written personalized care plan for preventive services as well as general preventive health recommendations were provided to patient.     Ival Bible Breeann Reposa, CMA   07/08/2021   Nurse Notes: Non Face to Face 40 min visit   Ms. Delio , Thank you for taking time to come for your Medicare Wellness Visit. I appreciate your ongoing commitment to your health goals. Please review the following plan we discussed and let me know if I can assist you in the future.   These are the goals we discussed:  Goals      Weight (lb) < 200 lb (90.7 kg)        This is a list of the screening recommended for you and due dates:  Health Maintenance  Topic Date Due   Tetanus Vaccine  Never done   Zoster (Shingles) Vaccine (1 of 2) Never done   COVID-19 Vaccine (3 - Booster for Pfizer series) 04/27/2020   Eye exam for diabetics  12/25/2020   Flu Shot  05/20/2021   Complete foot exam   08/23/2021   Hemoglobin A1C  11/01/2021   Colon Cancer Screening  10/20/2025   DEXA scan (bone density measurement)  Completed   Hepatitis C Screening: USPSTF Recommendation to screen - Ages 12-79 yo.  Completed   HPV Vaccine  Aged Out

## 2021-07-08 NOTE — Telephone Encounter (Signed)
Call pt on both numbers listed for AWV could not leave VM.  KP

## 2021-07-08 NOTE — Patient Instructions (Signed)
Health Maintenance, Female Adopting a healthy lifestyle and getting preventive care are important in promoting health and wellness. Ask your health care provider about: The right schedule for you to have regular tests and exams. Things you can do on your own to prevent diseases and keep yourself healthy. What should I know about diet, weight, and exercise? Eat a healthy diet  Eat a diet that includes plenty of vegetables, fruits, low-fat dairy products, and lean protein. Do not eat a lot of foods that are high in solid fats, added sugars, or sodium. Maintain a healthy weight Body mass index (BMI) is used to identify weight problems. It estimates body fat based on height and weight. Your health care provider can help determine your BMI and help you achieve or maintain a healthy weight. Get regular exercise Get regular exercise. This is one of the most important things you can do for your health. Most adults should: Exercise for at least 150 minutes each week. The exercise should increase your heart rate and make you sweat (moderate-intensity exercise). Do strengthening exercises at least twice a week. This is in addition to the moderate-intensity exercise. Spend less time sitting. Even light physical activity can be beneficial. Watch cholesterol and blood lipids Have your blood tested for lipids and cholesterol at 75 years of age, then have this test every 5 years. Have your cholesterol levels checked more often if: Your lipid or cholesterol levels are high. You are older than 75 years of age. You are at high risk for heart disease. What should I know about cancer screening? Depending on your health history and family history, you may need to have cancer screening at various ages. This may include screening for: Breast cancer. Cervical cancer. Colorectal cancer. Skin cancer. Lung cancer. What should I know about heart disease, diabetes, and high blood pressure? Blood pressure and heart  disease High blood pressure causes heart disease and increases the risk of stroke. This is more likely to develop in people who have high blood pressure readings, are of African descent, or are overweight. Have your blood pressure checked: Every 3-5 years if you are 18-39 years of age. Every year if you are 40 years old or older. Diabetes Have regular diabetes screenings. This checks your fasting blood sugar level. Have the screening done: Once every three years after age 40 if you are at a normal weight and have a low risk for diabetes. More often and at a younger age if you are overweight or have a high risk for diabetes. What should I know about preventing infection? Hepatitis B If you have a higher risk for hepatitis B, you should be screened for this virus. Talk with your health care provider to find out if you are at risk for hepatitis B infection. Hepatitis C Testing is recommended for: Everyone born from 1945 through 1965. Anyone with known risk factors for hepatitis C. Sexually transmitted infections (STIs) Get screened for STIs, including gonorrhea and chlamydia, if: You are sexually active and are younger than 75 years of age. You are older than 75 years of age and your health care provider tells you that you are at risk for this type of infection. Your sexual activity has changed since you were last screened, and you are at increased risk for chlamydia or gonorrhea. Ask your health care provider if you are at risk. Ask your health care provider about whether you are at high risk for HIV. Your health care provider may recommend a prescription medicine   to help prevent HIV infection. If you choose to take medicine to prevent HIV, you should first get tested for HIV. You should then be tested every 3 months for as long as you are taking the medicine. Pregnancy If you are about to stop having your period (premenopausal) and you may become pregnant, seek counseling before you get  pregnant. Take 400 to 800 micrograms (mcg) of folic acid every day if you become pregnant. Ask for birth control (contraception) if you want to prevent pregnancy. Osteoporosis and menopause Osteoporosis is a disease in which the bones lose minerals and strength with aging. This can result in bone fractures. If you are 65 years old or older, or if you are at risk for osteoporosis and fractures, ask your health care provider if you should: Be screened for bone loss. Take a calcium or vitamin D supplement to lower your risk of fractures. Be given hormone replacement therapy (HRT) to treat symptoms of menopause. Follow these instructions at home: Lifestyle Do not use any products that contain nicotine or tobacco, such as cigarettes, e-cigarettes, and chewing tobacco. If you need help quitting, ask your health care provider. Do not use street drugs. Do not share needles. Ask your health care provider for help if you need support or information about quitting drugs. Alcohol use Do not drink alcohol if: Your health care provider tells you not to drink. You are pregnant, may be pregnant, or are planning to become pregnant. If you drink alcohol: Limit how much you use to 0-1 drink a day. Limit intake if you are breastfeeding. Be aware of how much alcohol is in your drink. In the [place] one drink equals one 12 oz bottle of beer (355 mL), one 5 oz glass of wine (148 mL), or one 1 oz glass of hard liquor (44 mL). General instructions Schedule regular health, dental, and eye exams. Stay current with your vaccines. Tell your health care provider if: You often feel depressed. You have ever been abused or do not feel safe at home. Summary Adopting a healthy lifestyle and getting preventive care are important in promoting health and wellness. Follow your health care provider's instructions about healthy diet, exercising, and getting tested or screened for diseases. Follow your health care provider's  instructions on monitoring your cholesterol and blood pressure. This information is not intended to replace advice given to you by your health care provider. Make sure you discuss any questions you have with your health care provider. Document Revised: 12/14/2020 Document Reviewed: 09/29/2018 Elsevier Patient Education  2022 Elsevier Inc.  

## 2021-08-22 DIAGNOSIS — H524 Presbyopia: Secondary | ICD-10-CM | POA: Diagnosis not present

## 2021-08-25 ENCOUNTER — Other Ambulatory Visit: Payer: Self-pay | Admitting: Internal Medicine

## 2021-08-25 DIAGNOSIS — I10 Essential (primary) hypertension: Secondary | ICD-10-CM

## 2021-08-28 ENCOUNTER — Telehealth: Payer: Self-pay | Admitting: Pharmacy Technician

## 2021-08-28 ENCOUNTER — Ambulatory Visit: Payer: Medicare HMO

## 2021-08-28 DIAGNOSIS — Z596 Low income: Secondary | ICD-10-CM

## 2021-08-28 NOTE — Progress Notes (Signed)
[facility])                                            [place] Team    08/28/2021  Bridget Butler [birth year] [id number]  FOR 2023 RE ENROLLMENT                                      Medication Assistance Referral  Referral From: [place] [facility] / [facility] Patient application portion:  Mailed Provider application portion: Faxed  to Dr. Cruzita Lederer Provider address/fax verified via: Office website  Medication/Company: Bridget Butler / Bridget Butler Patient application portion:  Mailed Provider application portion: Faxed  to Dr. Cruzita Lederer Provider address/fax verified via: Office website   Bridget Butler, [place]  [phone]

## 2021-09-04 ENCOUNTER — Ambulatory Visit: Payer: Medicare HMO

## 2021-09-09 ENCOUNTER — Encounter: Payer: Self-pay | Admitting: Internal Medicine

## 2021-09-09 ENCOUNTER — Ambulatory Visit (INDEPENDENT_AMBULATORY_CARE_PROVIDER_SITE_OTHER): Payer: Medicare HMO | Admitting: Internal Medicine

## 2021-09-09 ENCOUNTER — Other Ambulatory Visit: Payer: Self-pay

## 2021-09-09 VITALS — BP 140/68 | HR 68 | Ht 63.0 in | Wt 190.4 lb

## 2021-09-09 DIAGNOSIS — E0590 Thyrotoxicosis, unspecified without thyrotoxic crisis or storm: Secondary | ICD-10-CM | POA: Diagnosis not present

## 2021-09-09 DIAGNOSIS — E11319 Type 2 diabetes mellitus with unspecified diabetic retinopathy without macular edema: Secondary | ICD-10-CM | POA: Diagnosis not present

## 2021-09-09 DIAGNOSIS — E042 Nontoxic multinodular goiter: Secondary | ICD-10-CM

## 2021-09-09 DIAGNOSIS — Z794 Long term (current) use of insulin: Secondary | ICD-10-CM

## 2021-09-09 LAB — POCT GLYCOSYLATED HEMOGLOBIN (HGB A1C): Hemoglobin A1C: 7.5 % — AB (ref 4.0–5.6)

## 2021-09-09 NOTE — Patient Instructions (Addendum)
Please continue: - Metformin ER 1000 mg at bedtime.  Increase: - Basaglar 16 units in am - Trulicity 1.5 mg weekly  (for now take the 0.75 mg x2 weekly)  Please return in 3-4 months with your sugar log.

## 2021-09-09 NOTE — Progress Notes (Signed)
Patient ID: Bridget Butler, female   DOB: Aug 21, 1946, 75 y.o.   MRN: [id number]   This visit occurred during the SARS-CoV-2 public health emergency.  Safety protocols were in place, including screening questions prior to the visit, additional usage of staff PPE, and extensive cleaning of exam room while observing appropriate contact time as indicated for disinfecting solutions.   HPI  Bridget Butler is a 75 y.o.-year-old female, initially referred by her PCP, Dr. Brigitte Pulse, returning for follow-up for toxic thyroid adenomas with subclinical thyrotoxicosis and now also uncontrolled DM2, insulin-dependent, with complications (CKD, DR, PN).  Last visit 4 months ago.  Interim history: No increased urination, blurry vision, nausea, chest pain.  Toxic thyroid nodules:  Reviewed history: She was diagnosed with hyperthyroidism approximately 2010. She was seeing Dr. Jeanann Lewandowsky - he retired. She was started on medication (cannot remember name) >> felt "off" >> stopped in ~2010.  Reviewed her TFTs: Lab Results  Component Value Date   TSH 0.38 01/14/2021   TSH 0.57 07/18/2020   TSH 0.148 (L) 06/04/2020   TSH 0.03 (L) 04/11/2019   TSH 0.03 (L) 10/29/2018   TSH 0.054 (L) 07/08/2018   TSH 0.118 (L) 03/29/2018   FREET4 0.90 01/14/2021   FREET4 0.66 07/18/2020   FREET4 0.93 06/08/2020   FREET4 0.84 04/11/2019   FREET4 0.97 10/29/2018   FREET4 1.05 07/08/2018   T3FREE 3.0 01/14/2021   T3FREE 3.1 07/18/2020   T3FREE 3.0 04/11/2019   T3FREE 3.8 10/29/2018   T3FREE 3.3 07/08/2018   Her TSI antibodies were not elevated: Lab Results  Component Value Date   TSI <89 10/29/2018    Thyroid uptake (08/28/2007):  There is heterogeneous uptake within a normal sized gland. There are regions of photopenia within the bilateral upper poles and in the left lower pole. The most intense uptake is in the mid pole of the right lobe of the thyroid gland. Decreased uptake in the inferior pole of the right lobe of the  thyroid gland additionally.   Calculated 24-hour I-131 uptake is equal to 25.0%, which is normal (normal 10-30%).   IMPRESSION:   1. Heterogeneous uptake within the thyroid gland could represent resolving subacute thyroiditis in patient with a normal uptake.  2. Cannot exclude a multinodular goiter if TSH continues to remain depressed  Labs at last visit, we obtained a thyroid uptake and scan (11/30/2018): Normal uptake but multinodular goiter with warm nodules at inferior thyroid poles:  4 hour I-123 uptake = 8.6% (normal 5-20%) 24 hour I-123 uptake = 25.3% (normal 10-30%)   IMPRESSION: Normal 4 hour and 24 hour radio iodine uptakes.   Multinodular thyroid gland with warm nodules at the inferior poles of both thyroid lobes, corresponding to dominant masses identified on a remote thyroid ultrasound consistent with adenomas.  No FH of thyroid disease or thyroid cancer. No h/o radiation tx to head or neck.  No herbal supplements. No Biotin use. No recent steroids use.   Multinodular goiter per ultrasound from 06/28/2013.   Biopsies were benign on 07/13/2013.  Pt denies: - feeling nodules in neck - hoarseness - dysphagia - choking - SOB with lying down  DM2: -Diagnosed: 9092 - insulin started: ~2006  Her diabetes became uncontrolled after her first laryngeal surgery in 03/2020.  She was on steroids at the time of her surgery.  Reviewed HbA1c levels: Lab Results  Component Value Date   HGBA1C 7.7 (H) 05/01/2021   HGBA1C 7.5 (A) 01/14/2021   HGBA1C 7.7 (A) 10/15/2020  HGBA1C 9.1 (A) 08/23/2020   HGBA1C 10.4 (H) 07/11/2020   HGBA1C 7.0 (H) 03/29/2020   HGBA1C 7.1 (H) 12/05/2019   HGBA1C 6.4 (H) 06/03/2019   HGBA1C 7.0 (H) 07/08/2018   HGBA1C 6.7 (A) 03/29/2018   She is on: - Janumet 50-1000 mg at bedtime >> Metformin ER 1000 mg at bedtime - Trulicity 2.58  weekly -gets these through patient assistance (did not increase the dose as she did not receive it from the pgm) -  Basaglar 20 units at bedtime >> in am >> 16 >> 14 >> 16 >> 14 units in a.m.  She had GI intolerance with regular Metformin.  Pt checks her sugars once a day: - am:  69, 80-90, 145 >> 92-127, 159 >> 98-148 - 2h after b'fast: n/c - before lunch: n/c >> 154 - 2h after lunch: n/c >> 178 >> 154, 159 - before dinner: n/c >> 211 - 2h after dinner: n/c - bedtime: 300s - last 4 days: n/c >> n/c >> 152 >> 252 - nighttime: n/c Lowest sugar was 89 >> 63 >> 69 >> ; she has hypoglycemia awareness at 70.  Highest sugar was 400s >> 135 >> 145 >> 252 (no meds).  Glucometer: True Metrix  Pt's meals are: - Breakfast: eggs, [place] sausage or oatmeal - Lunch: salad + eggs + [place] meat (no pork) - Dinner: veggies + chicken/turkey and fish - Snacks: not after sx  -+ Mild CKD, last BUN/creatinine:  Lab Results  Component Value Date   BUN 18 05/01/2021   BUN 22 07/11/2020   CREATININE 1.04 05/01/2021   CREATININE 1.30 (H) 07/11/2020  On losartan 50  -+ HL; last set of lipids: Lab Results  Component Value Date   CHOL 178 05/01/2021   HDL 63.50 05/01/2021   LDLCALC 91 05/01/2021   TRIG 118.0 05/01/2021   CHOLHDL 3 05/01/2021  On pravastatin 40.  - last eye exam was on 03/01/2021: + DR, stable reportedly. Sees retina specialist. Dr. Sherlynn Stalls.  -+ Numbness and tingling in her feet.  On ASA 81  Pt has FH of DM in mother.  She also has a history of GERD, osteopenia, microlaryngoscopy for subglottic stenosis 06 and 06/2020.  ROS:  + see HPI, + hypoacusis Neurological: no tremors/+ numbness/+ tingling/no dizziness  I reviewed pt's medications, allergies, PMH, social hx, family hx, and changes were documented in the history of present illness. Otherwise, unchanged from my initial visit note.  Past Medical History:  Diagnosis Date   Cataract    Diabetes mellitus without complication (HCC)    Dyspnea    on exertion   GERD (gastroesophageal reflux disease)    Hyperlipidemia     Hypertension    Multiple thyroid nodules    Thyroid disease    Past Surgical History:  Procedure Laterality Date   CHOLECYSTECTOMY     COSMETIC SURGERY Bilateral    eyelids   EYE SURGERY     GALLBLADDER SURGERY     MICROLARYNGOSCOPY WITH CO2 LASER AND EXCISION OF VOCAL CORD LESION N/A 04/02/2020   Procedure: MICROLARYNGOSCOPY WITH DILATION AND CO2 LASER AND EXCISION OF VOCAL CORD LESION w/MITOMYCIN C;  Surgeon: Melida Quitter, MD;  Location: [place];  Service: ENT;  Laterality: N/A;   MICROLARYNGOSCOPY WITH DILATION N/A 05/23/2020   Procedure: MICROLARYNGOSCOPY W/ DILATION AND JET VENTILATION;  Surgeon: Melida Quitter, MD;  Location: [place];  Service: ENT;  Laterality: N/A;   MICROLARYNGOSCOPY WITH DILATION N/A 07/13/2020   Procedure: MICROLARYNGOSCOPY with Jet  Ventilation;  Surgeon: Melida Quitter, MD;  Location: [place];  Service: ENT;  Laterality: N/A;   TRACHEOSTOMY TUBE PLACEMENT N/A 04/02/2020   Procedure: Awake TRACHEOSTOMY;  Surgeon: Melida Quitter, MD;  Location: [place];  Service: ENT;  Laterality: N/A;   TUBAL LIGATION     Social History   Socioeconomic History   Marital status: Widowed    Spouse name: Not on file   Number of children: 3   Years of education: Not on file   Highest education level: Not on file  Occupational History   Not on file  Tobacco Use   Smoking status: Never   Smokeless tobacco: Never  Vaping Use   Vaping Use: Never used  Substance and Sexual Activity   Alcohol use: No   Drug use: No   Sexual activity: Not Currently    Partners: Male  Other Topics Concern   Not on file  Social History Narrative   Mrs Royse is a 75 year old Jehovah witness patient    She receives support from her daughter primarily who also assists with transportation to medical appointments.    She is presently independent with her ADLs and iADLs      Social Determinants of Health   Financial Resource Strain: Not on file  Food Insecurity: Not on file  Transportation Needs: Not on  file  Physical Activity: Not on file  Stress: Not on file  Social Connections: Not on file  Intimate Partner Violence: Not on file   Current Outpatient Medications on File Prior to Visit  Medication Sig Dispense Refill   Blood Glucose Monitoring Suppl (TRUE METRIX METER) DEVI 1 Device by Does not apply route 2 (two) times daily. 1 Device 0   Cholecalciferol 50 MCG (2000 UT) TABS Take 1 tablet (2,000 Units total) by mouth daily. 90 tablet 1   Dulaglutide (TRULICITY) 1.5 BT/5.9RC SOPN Inject 1.5 mg into the skin once a week. 6 mL 3   glucose blood (TRUE METRIX BLOOD GLUCOSE TEST) test strip Use as instructed to check blood sugar 1 daily 100 each 2   Insulin Glargine (BASAGLAR KWIKPEN) 100 UNIT/ML Inject 16-20 Units into the skin at bedtime. 45 mL 3   Insulin Pen Needle (PEN NEEDLES) 32G X 5 MM MISC 1 Units by Does not apply route daily. 100 each 5   losartan-hydrochlorothiazide (HYZAAR) 50-12.5 MG tablet Take 1 tablet by mouth once daily 90 tablet 0   metFORMIN (GLUCOPHAGE-XR) 500 MG 24 hr tablet Take 2 tablets (1,000 mg total) by mouth daily with supper. 60 tablet 11   nebivolol (BYSTOLIC) 5 MG tablet Take 1 tablet (5 mg total) by mouth daily. (Patient not taking: Reported on 05/06/2021) 90 tablet 0   omeprazole (PRILOSEC) 40 MG capsule Take 1 capsule (40 mg total) by mouth in the morning and at bedtime. 90 capsule 1   pravastatin (PRAVACHOL) 40 MG tablet Take 1 tablet (40 mg total) by mouth at bedtime. 90 tablet 1   Tetrahydrozoline HCl (VISINE OP) Apply 1 drop to eye daily as needed (dry eyes).     No current facility-administered medications on file prior to visit.   No Known Allergies Family History  Problem Relation Age of Onset   Heart disease Mother    Hypertension Mother    Cancer Sister    Diabetes Sister    PE: BP 140/68 (BP Location: Right Arm, Patient Position: Sitting, Cuff Size: Normal)   Pulse 68   Ht 5\' 3"  (1.6 m)  Wt 190 lb 6.4 oz (86.4 kg)   SpO2 99%   BMI 33.73  kg/m   Wt Readings from Last 3 Encounters:  09/09/21 190 lb 6.4 oz (86.4 kg)  05/06/21 185 lb 3.2 oz (84 kg)  05/01/21 186 lb (84.4 kg)   Constitutional: overweight, in NAD Eyes: PERRLA, EOMI, no exophthalmos ENT: moist mucous membranes, no thyromegaly, no thyroid nodule palpated, no cervical lymphadenopathy Cardiovascular: RRR, No MRG, B LE edema Respiratory: CTA B Gastrointestinal: abdomen soft, NT, ND, BS+ Musculoskeletal: no deformities, strength intact in all 4 Skin: moist, warm, no rashes Neurological: no tremor with outstretched hands, DTR normal in all 4  ASSESSMENT: 1. DM2-insulin dependent, uncontrolled, with complications - CKD - DR - PN  2.  Thyrotoxic adenomas  3. Multiple thyroid nodules  PLAN:  1. DM2 -Patient with longstanding type 2 diabetes, on metformin ER, weekly GLP-1 receptor agonist also daily long-acting insulin, with higher blood sugars at last visit and also a higher HbA1c, at 7.7%, after which I advised her to increase Trulicity to 1.5 mg weekly.  She is getting this from the patient assistance program. - at this OV, it appears that her blood sugars are at or above target in the morning but mostly above goal later in the day.  Upon questioning, she did not increase her Trulicity dose as she still had the 0.75 mg weekly dose at home.  At this visit, we will again submit an addendum to the [employer] PAP application reflecting the increase in dose.  I advised her that if the sugars do not improve afterwards, she can also increase the dose of Basaglar back to 16 units. - I advised the pt to: Patient Instructions  Please continue: - Metformin ER 1000 mg at bedtime.  Increase: - Basaglar 16 units in am - Trulicity 1.5 mg weekly  (for now take the 0.75 mg x2 weekly)  Please return in 3-4 months with your sugar log.   - we checked her HbA1c: 7.5% (slightly lower) - advised to check sugars at different times of the day - 1x a day, rotating check times -  advised for yearly eye exams >> she is UTD - return to clinic in 4 months  2. Patient with a long history of low TSH without thyrotoxic symptoms: no weight loss, heat intolerance, hyper defecation, palpitations, anxiety.  She did not have apparent exogenous causes for her low TSH.  Previous investigation showed an abnormal uptake and scan with toxic adenoma seen in the inferior thyroid lobes. -We did not have to intervene in the past since she only had a mildly suppressed TSH with a normal free T4 and free T3, but  in 03/2019, TSH was quite low, at 0.03 and we discussed about RAI treatment.  Interestingly, however, TFTs improved afterwards -At last visit, TFTs were normal -we will recheck them at next visit -At today's visit, she does not have thyrotoxic signs or symptoms  3. Multiple thyroid nodules -No neck compression symptoms -The 2 dominant nodules were biopsied with benign results in 2014 -Reviewed her thyroid uptake and scan from 2020.  At that time, she had more nodules in the lower poles of the thyroid, but without indication for biopsy -We did discuss in the past about the fact that RAI treatment can shrink these nodules, in in case we decide to go this route -As of now, we are managing her expectantly  Philemon Kingdom, MD PhD [facility] Endocrinology

## 2021-09-11 ENCOUNTER — Telehealth: Payer: Self-pay

## 2021-09-11 ENCOUNTER — Other Ambulatory Visit: Payer: Self-pay

## 2021-09-11 ENCOUNTER — Ambulatory Visit (INDEPENDENT_AMBULATORY_CARE_PROVIDER_SITE_OTHER): Payer: Medicare HMO

## 2021-09-11 VITALS — BP 138/70 | HR 89 | Temp 98.0°F | Resp 16 | Ht 63.0 in | Wt 187.0 lb

## 2021-09-11 DIAGNOSIS — Z0000 Encounter for general adult medical examination without abnormal findings: Secondary | ICD-10-CM | POA: Diagnosis not present

## 2021-09-11 NOTE — Progress Notes (Signed)
Subjective:   Bridget Butler is a 75 y.o. female who presents for Medicare Annual (Subsequent) preventive examination.  Review of Systems     Cardiac Risk Factors include: advanced age (>40men, >45 women);diabetes mellitus;dyslipidemia;family history of premature cardiovascular disease;hypertension;obesity (BMI >30kg/m2)     Objective:    Today's Vitals   09/11/21 1258  BP: 138/70  Pulse: 89  Resp: 16  Temp: 98 F (36.7 C)  SpO2: 95%  Weight: 187 lb (84.8 kg)  Height: 5\' 3"  (1.6 m)  PainSc: 0-No pain   Body mass index is 33.13 kg/m.  Advanced Directives 09/11/2021 03/09/2021 07/11/2020 05/16/2020 03/29/2020 01/02/2020 06/08/2019  Does Patient Have a Medical Advance Directive? Yes No No Yes Yes No Yes  Type of Advance Directive - - Public librarian;Living will Healthcare Power of [place];Living will  Does patient want to make changes to medical advance directive? No - Patient declined - - - No - Patient declined Yes (ED - Information included in AVS) No - Patient declined  Copy of [place] in Chart? - - - Yes - validated most recent copy scanned in chart (See row information) Yes - validated most recent copy scanned in chart (See row information) - No - copy requested  Would patient like information on creating a medical advance directive? - No - Patient declined Yes (MAU/Ambulatory/Procedural Areas - Information given) - - Yes (ED - Information included in AVS) -    Current Medications (verified) Outpatient Encounter Medications as of 09/11/2021  Medication Sig   Blood Glucose Monitoring Suppl (TRUE METRIX METER) DEVI 1 Device by Does not apply route 2 (two) times daily.   Cholecalciferol 50 MCG (2000 UT) TABS Take 1 tablet (2,000 Units total) by mouth daily.   Dulaglutide (TRULICITY) 1.5 ZO/1.0RU SOPN Inject 1.5 mg into the skin once a week.   glucose blood (TRUE METRIX BLOOD GLUCOSE TEST) test strip Use as  instructed to check blood sugar 1 daily   Insulin Glargine (BASAGLAR KWIKPEN) 100 UNIT/ML Inject 16-20 Units into the skin at bedtime. (Patient taking differently: Inject 14 Units into the skin daily before breakfast.)   Insulin Pen Needle (PEN NEEDLES) 32G X 5 MM MISC 1 Units by Does not apply route daily.   losartan-hydrochlorothiazide (HYZAAR) 50-12.5 MG tablet Take 1 tablet by mouth once daily   metFORMIN (GLUCOPHAGE-XR) 500 MG 24 hr tablet Take 2 tablets (1,000 mg total) by mouth daily with supper.   Multiple Vitamin (MULTIVITAMIN WITH MINERALS) TABS tablet Take 1 tablet by mouth daily.   omeprazole (PRILOSEC) 40 MG capsule Take 1 capsule (40 mg total) by mouth in the morning and at bedtime.   pravastatin (PRAVACHOL) 40 MG tablet Take 1 tablet (40 mg total) by mouth at bedtime.   Tetrahydrozoline HCl (VISINE OP) Apply 1 drop to eye daily as needed (dry eyes).   nebivolol (BYSTOLIC) 5 MG tablet Take 1 tablet (5 mg total) by mouth daily. (Patient not taking: Reported on 09/11/2021)   No facility-administered encounter medications on file as of 09/11/2021.    Allergies (verified) Patient has no known allergies.   History: Past Medical History:  Diagnosis Date   Cataract    Diabetes mellitus without complication (HCC)    Dyspnea    on exertion   GERD (gastroesophageal reflux disease)    Hyperlipidemia    Hypertension    Multiple thyroid nodules    Thyroid disease    Past Surgical History:  Procedure Laterality Date   CHOLECYSTECTOMY     COSMETIC SURGERY Bilateral    eyelids   EYE SURGERY     GALLBLADDER SURGERY     MICROLARYNGOSCOPY WITH CO2 LASER AND EXCISION OF VOCAL CORD LESION N/A 04/02/2020   Procedure: MICROLARYNGOSCOPY WITH DILATION AND CO2 LASER AND EXCISION OF VOCAL CORD LESION w/MITOMYCIN C;  Surgeon: Melida Quitter, MD;  Location: [facility];  Service: ENT;  Laterality: N/A;   MICROLARYNGOSCOPY WITH DILATION N/A 05/23/2020   Procedure: MICROLARYNGOSCOPY W/ DILATION AND JET  VENTILATION;  Surgeon: Melida Quitter, MD;  Location: [facility] [facility];  Service: ENT;  Laterality: N/A;   MICROLARYNGOSCOPY WITH DILATION N/A 07/13/2020   Procedure: MICROLARYNGOSCOPY with Jet Ventilation;  Surgeon: Melida Quitter, MD;  Location: [place];  Service: ENT;  Laterality: N/A;   TRACHEOSTOMY TUBE PLACEMENT N/A 04/02/2020   Procedure: Awake TRACHEOSTOMY;  Surgeon: Melida Quitter, MD;  Location[facility];  Service: ENT;  Laterality: N/A;   TUBAL LIGATION     Family History  Problem Relation Age of Onset   Heart disease Mother    Hypertension Mother    Cancer Sister    Diabetes Sister    Social History   Socioeconomic History   Marital status: Widowed    Spouse name: Not on file   Number of children: 3   Years of education: Not on file   Highest education level: Not on file  Occupational History   Not on file  Tobacco Use   Smoking status: Never   Smokeless tobacco: Never  Vaping Use   Vaping Use: Never used  Substance and Sexual Activity   Alcohol use: No   Drug use: No   Sexual activity: Not Currently    Partners: Male  Other Topics Concern   Not on file  Social History Narrative   Bridget Butler is a 75 year old Jehovah witness patient    She receives support from her daughter primarily who also assists with transportation to medical appointments.    She is presently independent with her ADLs and iADLs      Social Determinants of Health   Financial Resource Strain: Low Risk    Difficulty of Paying Living Expenses: Not hard at all  Food Insecurity: No Food Insecurity   Worried About Charity fundraiser in the Last Year: Never true   Arboriculturist in the Last Year: Never true  Transportation Needs: No Transportation Needs   Lack of Transportation (Medical): No   Lack of Transportation (Non-Medical): No  Physical Activity: Sufficiently Active   Days of Exercise per Week: 7 days   Minutes of Exercise per Session: 30 min  Stress: No Stress Concern Present   Feeling of Stress :  Not at all  Social Connections: Moderately Integrated   Frequency of Communication with Friends and Family: More than three times a week   Frequency of Social Gatherings with Friends and Family: More than three times a week   Attends Religious Services: More than 4 times per year   Active Member of [employer] or Organizations: Yes   Attends Archivist Meetings: More than 4 times per year   Marital Status: Widowed    Tobacco Counseling Counseling given: Not Answered   Clinical Intake:  Pre-visit preparation completed: Yes  Pain : No/denies pain Pain Score: 0-No pain     BMI - recorded: 33.13 Nutritional Status: BMI > 30  Obese Nutritional Risks: Other (Comment) Diabetes: Yes CBG done?: No Did pt.  bring in CBG monitor from home?: No  How often do you need to have someone help you when you read instructions, pamphlets, or other written materials from your doctor or pharmacy?: 1 - Never What is the last grade level you completed in school?: High School Graduate  Diabetic? yes  Interpreter Needed?: No  Information entered by :: Lisette Abu, LPN   Activities of Daily Living In your present state of health, do you have any difficulty performing the following activities: 09/11/2021  Hearing? N  Vision? N  Difficulty concentrating or making decisions? N  Walking or climbing stairs? N  Dressing or bathing? N  Doing errands, shopping? N  Preparing Food and eating ? N  Using the Toilet? N  In the past six months, have you accidently leaked urine? N  Do you have problems with loss of bowel control? N  Managing your Medications? N  Managing your Finances? N  Housekeeping or managing your Housekeeping? N  Some recent data might be hidden    Patient Care Team: Janith Lima, MD as PCP - General (Internal Medicine) Sherlynn Stalls, MD as Consulting Physician (Ophthalmology) Philemon Kingdom, MD as Consulting Physician (Internal Medicine)  Indicate any recent  Medical Services you may have received from other than [facility] providers in the past year (date may be approximate).     Assessment:   This is a routine wellness examination for [place].  Hearing/Vision screen Hearing Screening - Comments:: Patient is having hearing difficulty. No hearing aids. Patient would like referral to audiology, Dr. Melida Quitter  Vision Screening - Comments:: Patient wears corrective glasses/contacts.  Eye exam done annually by: Sherlynn Stalls, MD.  Dietary issues and exercise activities discussed: Current Exercise Habits: Home exercise routine, Type of exercise: walking, Time (Minutes): 30, Frequency (Times/Week): 7, Weekly Exercise (Minutes/Week): 210, Intensity: Moderate, Exercise limited by: None identified   Goals Addressed               This Visit's Progress     Patient Stated (pt-stated)        My goal is to get my HgA1c down to normal range and lose about 15 pounds.      Depression Screen PHQ 2/9 Scores 09/11/2021 08/23/2020 04/25/2020 01/02/2020 12/05/2019 06/08/2019 06/03/2019  PHQ - 2 Score 0 0 0 0 0 0 0    Fall Risk Fall Risk  09/11/2021 08/23/2020 06/04/2020 04/25/2020 01/02/2020  Falls in the past year? 0 0 0 0 0  Number falls in past yr: 0 0 0 0 0  Injury with Fall? 0 0 0 0 0  Risk for fall due to : No Fall Risks - - - -  Follow up Falls evaluation completed Falls evaluation completed - Falls evaluation completed Falls evaluation completed;Education provided    FALL RISK PREVENTION PERTAINING TO THE HOME:  Any stairs in or around the home? No  If so, are there any without handrails? No  Home free of loose throw rugs in walkways, pet beds, electrical cords, etc? Yes  Adequate lighting in your home to reduce risk of falls? Yes   ASSISTIVE DEVICES UTILIZED TO PREVENT FALLS:  Life alert? No  Use of a cane, walker or w/c? No  Grab bars in the bathroom? Yes  Shower chair or bench in shower? Yes  Elevated toilet seat or a handicapped toilet? Yes    TIMED UP AND GO:  Was the test performed? Yes .  Length of time to ambulate 10 feet: 8 sec.  Gait steady and fast without use of assistive device  Cognitive Function: Normal cognitive status assessed by direct observation by this Nurse Health Advisor. No abnormalities found.       6CIT Screen 01/02/2020  What Year? 0 points  What month? 0 points  What time? 0 points  Count back from 20 0 points  Months in reverse 2 points  Repeat phrase 0 points  Total Score 2    Immunizations Immunization History  Administered Date(s) Administered   Fluad Quad(high Dose 65+) 08/23/2020   Influenza, High Dose Seasonal PF 07/27/2017, 07/08/2018, 06/19/2019   Influenza-Unspecified 08/13/2021   PFIZER(Purple Top)SARS-COV-2 Vaccination 11/08/2019, 11/29/2019   Pneumococcal Conjugate-13 09/02/2019   Pneumococcal Polysaccharide-23 05/01/2021    TDAP status: Due, Education has been provided regarding the importance of this vaccine. Advised may receive this vaccine at local pharmacy or Health Dept. Aware to provide a copy of the vaccination record if obtained from local pharmacy or Health Dept. Verbalized acceptance and understanding.  Flu Vaccine status: Up to date  Pneumococcal vaccine status: Up to date  Covid-19 vaccine status: Completed vaccines  Qualifies for Shingles Vaccine? Yes   Zostavax completed No   Shingrix Completed?: No.    Education has been provided regarding the importance of this vaccine. Patient has been advised to call insurance company to determine out of pocket expense if they have not yet received this vaccine. Advised may also receive vaccine at local pharmacy or Health Dept. Verbalized acceptance and understanding.  Screening Tests Health Maintenance  Topic Date Due   TETANUS/TDAP  Never done   Zoster Vaccines- Shingrix (1 of 2) Never done   COVID-19 Vaccine (3 - Booster for Pfizer series) 01/24/2020   OPHTHALMOLOGY EXAM  12/25/2020   FOOT EXAM  08/23/2021    HEMOGLOBIN A1C  03/09/2022   COLONOSCOPY (Pts 45-46yrs Insurance coverage will need to be confirmed)  10/20/2025   Pneumonia Vaccine 54+ Years old  Completed   INFLUENZA VACCINE  Completed   DEXA SCAN  Completed   Hepatitis C Screening  Completed   HPV VACCINES  Aged Out    Health Maintenance  Health Maintenance Due  Topic Date Due   TETANUS/TDAP  Never done   Zoster Vaccines- Shingrix (1 of 2) Never done   COVID-19 Vaccine (3 - Booster for Pfizer series) 01/24/2020   OPHTHALMOLOGY EXAM  12/25/2020   FOOT EXAM  08/23/2021    Colorectal cancer screening: Type of screening: Colonoscopy. Completed 04/10/2016. Repeat every 10 years  Mammogram status: Completed 07/03/2021. Repeat every year  Bone Density status: Completed 05/21/2018. Results reflect: Bone density results: OSTEOPENIA. Repeat every 2-3 years.  Lung Cancer Screening: (Low Dose CT Chest recommended if Age 52-80 years, 30 pack-year currently smoking OR have quit w/in 15years.) does not qualify.   Lung Cancer Screening Referral: no  Additional Screening:  Hepatitis C Screening: does qualify; Completed yes  Vision Screening: Recommended annual ophthalmology exams for early detection of glaucoma and other disorders of the eye. Is the patient up to date with their annual eye exam?  Yes  Who is the provider or what is the name of the office in which the patient attends annual eye exams? Sherlynn Stalls, MD. If pt is not established with a provider, would they like to be referred to a provider to establish care? No .   Dental Screening: Recommended annual dental exams for proper oral hygiene  Community Resource Referral / Chronic Care Management: CRR required this visit?  No   CCM  required this visit?  No      Plan:     I have personally reviewed and noted the following in the patient's chart:   Medical and social history Use of alcohol, tobacco or illicit drugs  Current medications and supplements including opioid  prescriptions.  Functional ability and status Nutritional status Physical activity Advanced directives List of other physicians Hospitalizations, surgeries, and ER visits in previous 12 months Vitals Screenings to include cognitive, depression, and falls Referrals and appointments  In addition, I have reviewed and discussed with patient certain preventive protocols, quality metrics, and best practice recommendations. A written personalized care plan for preventive services as well as general preventive health recommendations were provided to patient.     Sheral Flow, LPN   [id number]   Nurse Notes:  Hearing Screening - Comments:: Patient is having hearing difficulty. No hearing aids. Patient would like referral to audiology, Dr. Melida Quitter  Vision Screening - Comments:: Patient wears corrective glasses/contacts.  Eye exam done annually by: Sherlynn Stalls, MD.

## 2021-09-11 NOTE — Telephone Encounter (Signed)
Patient complaining of having a tan, bloody vaginal discharge, which lasted about 5 days.  Patient stated there was no odor, cramping or pain.  She did have to use a pad for protection.  Patient has not seen OB/GYN since her last child was born.  She would like to know what you suggest.  She has no OB/GYN.

## 2021-09-11 NOTE — Patient Instructions (Signed)
[name] , Thank you for taking time to come for your Medicare Wellness Visit. I appreciate your ongoing commitment to your health goals. Please review the following plan we discussed and let me know if I can assist you in the future.   Screening recommendations/referrals: Colonoscopy: 04/10/2016; due every 10 years (due 04/10/2026) Mammogram: 07/03/2021; due every year Bone Density: 05/21/2018; due every 2-3 years Recommended yearly ophthalmology/optometry visit for glaucoma screening and checkup Recommended yearly dental visit for hygiene and checkup  Vaccinations: Influenza vaccine: 07/2021 Pneumococcal vaccine: 09/02/2019, 05/01/2021 Tdap vaccine: never done Shingles vaccine: never done   Covid-19: 11/08/2019, 11/29/2019, 09/25/2020  Advanced directives: Yes; documents on file  Conditions/risks identified: Yes; Client understands the importance of follow-up with providers by attending scheduled visits and discussed goals to eat healthier, increase physical activity, exercise the brain, socialize more, get enough sleep and make time for laughter.  Next appointment: Please schedule your next Medicare Wellness Visit with your Nurse Health Advisor in 1 year by calling [phone].   Preventive Care 75 Years and Older, Female Preventive care refers to lifestyle choices and visits with your health care provider that can promote health and wellness. What does preventive care include? A yearly physical exam. This is also called an annual well check. Dental exams once or twice a year. Routine eye exams. Ask your health care provider how often you should have your eyes checked. Personal lifestyle choices, including: Daily care of your teeth and gums. Regular physical activity. Eating a healthy diet. Avoiding tobacco and drug use. Limiting alcohol use. Practicing safe sex. Taking low-dose aspirin every day. Taking vitamin and mineral supplements as recommended by your health care  provider. What happens during an annual well check? The services and screenings done by your health care provider during your annual well check will depend on your age, overall health, lifestyle risk factors, and family history of disease. Counseling  Your health care provider may ask you questions about your: Alcohol use. Tobacco use. Drug use. Emotional well-being. Home and relationship well-being. Sexual activity. Eating habits. History of falls. Memory and ability to understand (cognition). Work and work Statistician. Reproductive health. Screening  You may have the following tests or measurements: Height, weight, and BMI. Blood pressure. Lipid and cholesterol levels. These may be checked every 5 years, or more frequently if you are over 52 years old. Skin check. Lung cancer screening. You may have this screening every year starting at age 75 if you have a 30-pack-year history of smoking and currently smoke or have quit within the past 15 years. Fecal occult blood test (FOBT) of the stool. You may have this test every year starting at age 75. Flexible sigmoidoscopy or colonoscopy. You may have a sigmoidoscopy every 5 years or a colonoscopy every 10 years starting at age 75. Hepatitis C blood test. Hepatitis B blood test. Sexually transmitted disease (STD) testing. Diabetes screening. This is done by checking your blood sugar (glucose) after you have not eaten for a while (fasting). You may have this done every 1-3 years. Bone density scan. This is done to screen for osteoporosis. You may have this done starting at age 15. Mammogram. This may be done every 1-2 years. Talk to your health care provider about how often you should have regular mammograms. Talk with your health care provider about your test results, treatment options, and if necessary, the need for more tests. Vaccines  Your health care provider may recommend certain vaccines, such as: Influenza vaccine. This is  recommended every year. Tetanus, diphtheria, and acellular pertussis (Tdap, Td) vaccine. You may need a Td booster every 10 years. Zoster vaccine. You may need this after age 75. Pneumococcal 13-valent conjugate (PCV13) vaccine. One dose is recommended after age 75. Pneumococcal polysaccharide (PPSV23) vaccine. One dose is recommended after age 75. Talk to your health care provider about which screenings and vaccines you need and how often you need them. This information is not intended to replace advice given to you by your health care provider. Make sure you discuss any questions you have with your health care provider. Document Released: 11/02/2015 Document Revised: 06/25/2016 Document Reviewed: 08/07/2015 Elsevier Interactive Patient Education  2017 [employer] Prevention in the Home Falls can cause injuries. They can happen to people of all ages. There are many things you can do to make your home safe and to help prevent falls. What can I do on the outside of my home? Regularly fix the edges of walkways and driveways and fix any cracks. Remove anything that might make you trip as you walk through a door, such as a raised step or threshold. Trim any bushes or trees on the path to your home. Use bright outdoor lighting. Clear any walking paths of anything that might make someone trip, such as rocks or tools. Regularly check to see if handrails are loose or broken. Make sure that both sides of any steps have handrails. Any raised decks and porches should have guardrails on the edges. Have any leaves, snow, or ice cleared regularly. Use sand or salt on walking paths during winter. Clean up any spills in your garage right away. This includes oil or grease spills. What can I do in the bathroom? Use night lights. Install grab bars by the toilet and in the tub and shower. Do not use towel bars as grab bars. Use non-skid mats or decals in the tub or shower. If you need to sit down in  the shower, use a plastic, non-slip stool. Keep the floor dry. Clean up any water that spills on the floor as soon as it happens. Remove soap buildup in the tub or shower regularly. Attach bath mats securely with double-sided non-slip rug tape. Do not have throw rugs and other things on the floor that can make you trip. What can I do in the bedroom? Use night lights. Make sure that you have a light by your bed that is easy to reach. Do not use any sheets or blankets that are too big for your bed. They should not hang down onto the floor. Have a firm chair that has side arms. You can use this for support while you get dressed. Do not have throw rugs and other things on the floor that can make you trip. What can I do in the kitchen? Clean up any spills right away. Avoid walking on wet floors. Keep items that you use a lot in easy-to-reach places. If you need to reach something above you, use a strong step stool that has a grab bar. Keep electrical cords out of the way. Do not use floor polish or wax that makes floors slippery. If you must use wax, use non-skid floor wax. Do not have throw rugs and other things on the floor that can make you trip. What can I do with my stairs? Do not leave any items on the stairs. Make sure that there are handrails on both sides of the stairs and use them. Fix handrails that are broken  or loose. Make sure that handrails are as long as the stairways. Check any carpeting to make sure that it is firmly attached to the stairs. Fix any carpet that is loose or worn. Avoid having throw rugs at the top or bottom of the stairs. If you do have throw rugs, attach them to the floor with carpet tape. Make sure that you have a light switch at the top of the stairs and the bottom of the stairs. If you do not have them, ask someone to add them for you. What else can I do to help prevent falls? Wear shoes that: Do not have high heels. Have rubber bottoms. Are comfortable  and fit you well. Are closed at the toe. Do not wear sandals. If you use a stepladder: Make sure that it is fully opened. Do not climb a closed stepladder. Make sure that both sides of the stepladder are locked into place. Ask someone to hold it for you, if possible. Clearly mark and make sure that you can see: Any grab bars or handrails. First and last steps. Where the edge of each step is. Use tools that help you move around (mobility aids) if they are needed. These include: Canes. Walkers. Scooters. Crutches. Turn on the lights when you go into a dark area. Replace any light bulbs as soon as they burn out. Set up your furniture so you have a clear path. Avoid moving your furniture around. If any of your floors are uneven, fix them. If there are any pets around you, be aware of where they are. Review your medicines with your doctor. Some medicines can make you feel dizzy. This can increase your chance of falling. Ask your doctor what other things that you can do to help prevent falls. This information is not intended to replace advice given to you by your health care provider. Make sure you discuss any questions you have with your health care provider. Document Released: 08/02/2009 Document Revised: 03/13/2016 Document Reviewed: 11/10/2014 Elsevier Interactive Patient Education  2017 [employer].

## 2021-09-23 NOTE — Telephone Encounter (Signed)
Patient calling checking Gyn referral, patient states she request the referral on 11-23  Patient also states she requested a referral to an ENT.  Patient requesting a call back to discuss referral request

## 2021-09-30 ENCOUNTER — Telehealth: Payer: Self-pay | Admitting: Pharmacy Technician

## 2021-09-30 DIAGNOSIS — Z596 Low income: Secondary | ICD-10-CM

## 2021-09-30 NOTE — Progress Notes (Signed)
[place] [facility])                                            [place] Team    09/30/2021  Cire Deyarmin Lem 1946-02-11 [id number]  Received both patient and provider portion(s) of patient assistance application(s) for Trulicity and [employer]. Faxed completed application and required documents into [employer].    Jaleigh Mccroskey P. Shabria Egley, [place]  [phone]

## 2021-10-15 ENCOUNTER — Telehealth: Payer: Self-pay

## 2021-10-15 NOTE — Telephone Encounter (Addendum)
Pt requesting referral to Dr. Melida Quitter ENT due to throat issue. Phone [phone].  Pt is also requesting referral for a Pap Smear due to issues.

## 2021-10-15 NOTE — Telephone Encounter (Signed)
Wondering if the growth has grown back that was surgically removed. She states the feeling is around the adams apple.

## 2021-10-15 NOTE — Telephone Encounter (Addendum)
She stated that while drinking things she choking over the last 2 weeks

## 2021-10-16 ENCOUNTER — Other Ambulatory Visit: Payer: Self-pay | Admitting: Internal Medicine

## 2021-10-16 DIAGNOSIS — E042 Nontoxic multinodular goiter: Secondary | ICD-10-CM | POA: Insufficient documentation

## 2021-10-17 ENCOUNTER — Telehealth: Payer: Self-pay | Admitting: Internal Medicine

## 2021-10-17 NOTE — Telephone Encounter (Signed)
1.Medication Requested: losartan-hydrochlorothiazide (HYZAAR) 50-12.5 MG tablet omeprazole (PRILOSEC) 40 MG capsule  2. Pharmacy (Name, [place], [facility]): [place], [place]  3. On Med List: yes   4. Last Visit with PCP: 05-01-2021  5. Next visit date with PCP: n/a   Agent: Please be advised that RX refills may take up to 3 business days. We ask that you follow-up with your pharmacy.

## 2021-10-18 NOTE — Telephone Encounter (Signed)
Patient's next ov 11-18-2021

## 2021-10-21 ENCOUNTER — Other Ambulatory Visit: Payer: Self-pay | Admitting: Internal Medicine

## 2021-10-21 DIAGNOSIS — K219 Gastro-esophageal reflux disease without esophagitis: Secondary | ICD-10-CM

## 2021-10-29 ENCOUNTER — Other Ambulatory Visit: Payer: Medicare HMO

## 2021-11-04 ENCOUNTER — Telehealth: Payer: Self-pay | Admitting: Pharmacy Technician

## 2021-11-04 DIAGNOSIS — Z596 Low income: Secondary | ICD-10-CM

## 2021-11-04 NOTE — Progress Notes (Signed)
[place] [facility])                                            [place] Team    11/04/2021  Bridget Butler [birth year] [id number]  Care coordination call placed to [place] in regard to Trulicity and [place] application.  Spoke to [place] who informs patient is APPROVED 10/20/21-10/19/22. She informs medication will ship to patient's home based on last fill dates in 2022.  Jeraldine Primeau P. Harace Mccluney, [place]  [phone]

## 2021-11-12 ENCOUNTER — Other Ambulatory Visit: Payer: Medicare PPO

## 2021-11-18 ENCOUNTER — Ambulatory Visit: Payer: Medicare HMO | Admitting: Internal Medicine

## 2021-11-19 ENCOUNTER — Other Ambulatory Visit: Payer: Self-pay | Admitting: Internal Medicine

## 2021-11-19 DIAGNOSIS — E785 Hyperlipidemia, unspecified: Secondary | ICD-10-CM

## 2021-11-19 DIAGNOSIS — I10 Essential (primary) hypertension: Secondary | ICD-10-CM

## 2021-11-26 ENCOUNTER — Other Ambulatory Visit: Payer: Self-pay | Admitting: Internal Medicine

## 2021-11-26 DIAGNOSIS — K219 Gastro-esophageal reflux disease without esophagitis: Secondary | ICD-10-CM

## 2021-12-02 IMAGING — DX DG CHEST 2V
2 series · 2 of 2 positions shown · non-contrast
Comparison: 11/01/2018

CLINICAL DATA: Wheezing

EXAM:
CHEST - 2 VIEW

[chest pa]
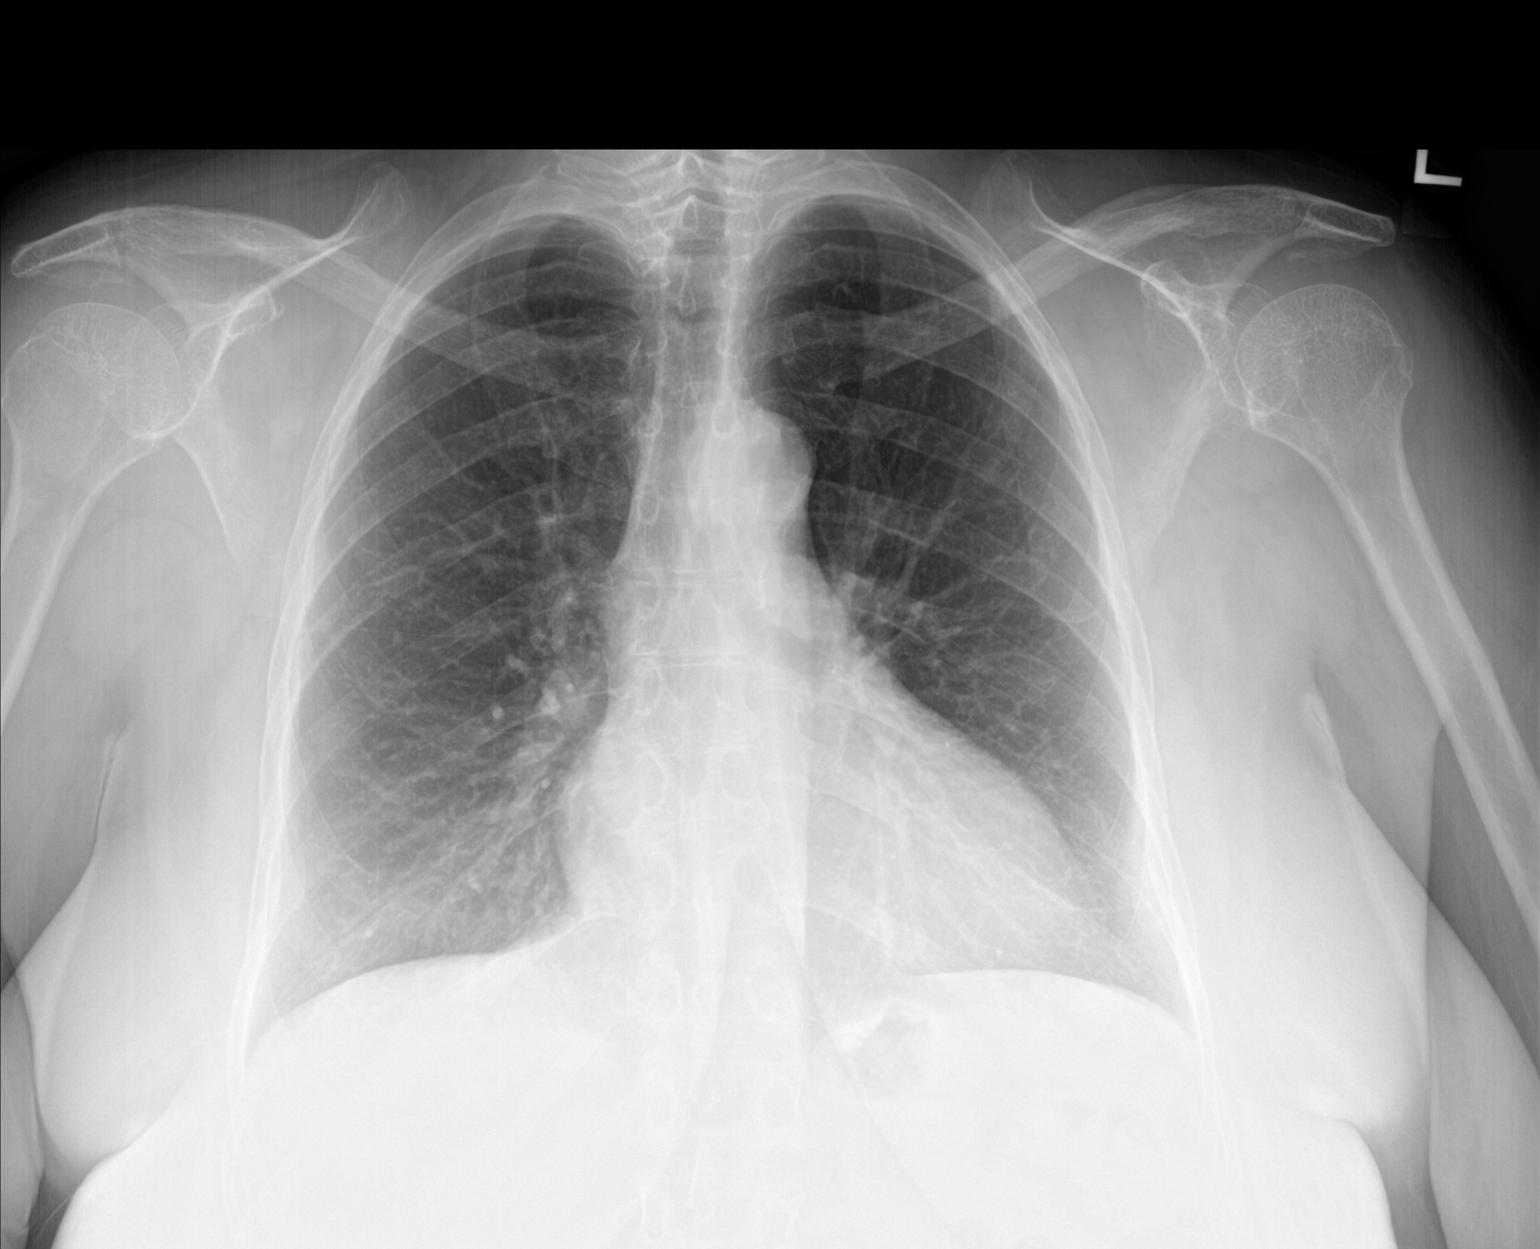

[chest lat]
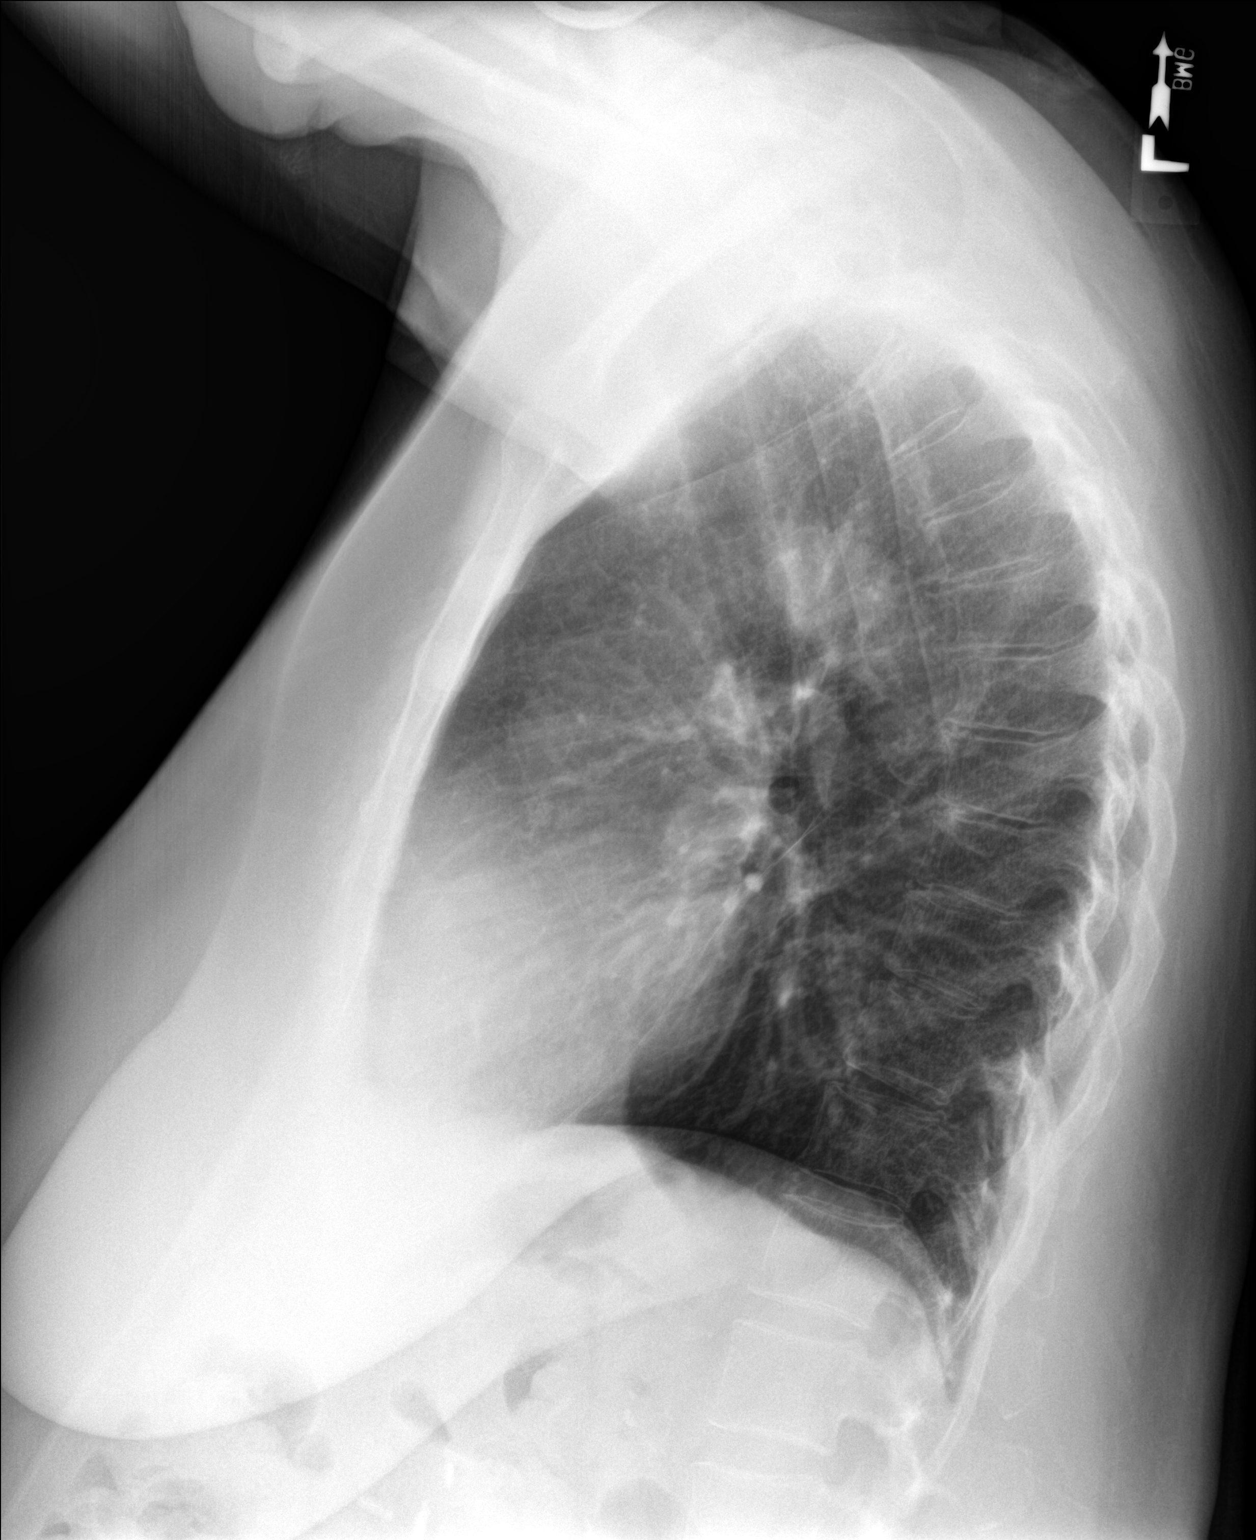

[2 of 2 positions shown; findings below may reference images not displayed]

FINDINGS: Mild cardiomegaly. Both lungs are clear. The visualized skeletal
structures are unremarkable.
IMPRESSION: Mild cardiomegaly without acute abnormality of the lungs.

## 2021-12-31 ENCOUNTER — Ambulatory Visit (INDEPENDENT_AMBULATORY_CARE_PROVIDER_SITE_OTHER): Payer: Medicare PPO | Admitting: Internal Medicine

## 2021-12-31 ENCOUNTER — Other Ambulatory Visit: Payer: Self-pay

## 2021-12-31 ENCOUNTER — Encounter: Payer: Self-pay | Admitting: Internal Medicine

## 2021-12-31 VITALS — BP 136/76 | HR 84 | Temp 98.3°F | Resp 16 | Ht 63.0 in | Wt 194.0 lb

## 2021-12-31 DIAGNOSIS — E1122 Type 2 diabetes mellitus with diabetic chronic kidney disease: Secondary | ICD-10-CM | POA: Diagnosis not present

## 2021-12-31 DIAGNOSIS — H9193 Unspecified hearing loss, bilateral: Secondary | ICD-10-CM

## 2021-12-31 DIAGNOSIS — E042 Nontoxic multinodular goiter: Secondary | ICD-10-CM | POA: Diagnosis not present

## 2021-12-31 DIAGNOSIS — N1831 Chronic kidney disease, stage 3a: Secondary | ICD-10-CM | POA: Diagnosis not present

## 2021-12-31 DIAGNOSIS — Z794 Long term (current) use of insulin: Secondary | ICD-10-CM

## 2021-12-31 DIAGNOSIS — D539 Nutritional anemia, unspecified: Secondary | ICD-10-CM

## 2021-12-31 DIAGNOSIS — Z23 Encounter for immunization: Secondary | ICD-10-CM

## 2021-12-31 DIAGNOSIS — I10 Essential (primary) hypertension: Secondary | ICD-10-CM | POA: Diagnosis not present

## 2021-12-31 DIAGNOSIS — N898 Other specified noninflammatory disorders of vagina: Secondary | ICD-10-CM | POA: Insufficient documentation

## 2021-12-31 DIAGNOSIS — E119 Type 2 diabetes mellitus without complications: Secondary | ICD-10-CM | POA: Diagnosis not present

## 2021-12-31 LAB — BASIC METABOLIC PANEL
BUN: 19 mg/dL (ref 6–23)
CO2: 31 mEq/L (ref 19–32)
Calcium: 10.3 mg/dL (ref 8.4–10.5)
Chloride: 99 mEq/L (ref 96–112)
Creatinine, Ser: 0.98 mg/dL (ref 0.40–1.20)
GFR: 56.39 mL/min — ABNORMAL LOW (ref >60.00)
Glucose, Bld: 135 mg/dL — ABNORMAL HIGH (ref 70–99)
Potassium: 3.7 mEq/L (ref 3.5–5.1)
Sodium: 137 mEq/L (ref 135–145)

## 2021-12-31 LAB — CBC WITH DIFFERENTIAL/PLATELET
Basophils Absolute: 0.1 10*3/uL (ref 0.0–0.1)
Basophils Relative: 0.9 % (ref 0.0–3.0)
Eosinophils Absolute: 0.1 10*3/uL (ref 0.0–0.7)
Eosinophils Relative: 1.2 % (ref 0.0–5.0)
HCT: 33.2 % — ABNORMAL LOW (ref 36.0–46.0)
Hemoglobin: 10.9 g/dL — ABNORMAL LOW (ref 12.0–15.0)
Lymphocytes Relative: 53.1 % — ABNORMAL HIGH (ref 12.0–46.0)
Lymphs Abs: 3.6 10*3/uL (ref 0.7–4.0)
MCHC: 32.8 g/dL (ref 30.0–36.0)
MCV: 97.4 fl (ref 78.0–100.0)
Monocytes Absolute: 0.4 10*3/uL (ref 0.1–1.0)
Monocytes Relative: 6.7 % (ref 3.0–12.0)
Neutro Abs: 2.6 10*3/uL (ref 1.4–7.7)
Neutrophils Relative %: 38.1 % — ABNORMAL LOW (ref 43.0–77.0)
Platelets: 276 10*3/uL (ref 150.0–400.0)
RBC: 3.41 Mil/uL — ABNORMAL LOW (ref 3.87–5.11)
RDW: 14.2 % (ref 11.5–15.5)
WBC: 6.7 10*3/uL (ref 4.0–10.5)

## 2021-12-31 LAB — HEMOGLOBIN A1C: Hgb A1c MFr Bld: 7.7 % — ABNORMAL HIGH (ref 4.6–6.5)

## 2021-12-31 LAB — TSH: TSH: 0.36 u[IU]/mL (ref 0.35–5.50)

## 2021-12-31 NOTE — Patient Instructions (Signed)

## 2021-12-31 NOTE — Progress Notes (Signed)
? ?Subjective:  ?Patient ID: Bridget Butler, female    DOB: 06/26/1946  Age: 76 y.o. MRN: [id number] ? ?CC: Anemia, Hypertension, and Diabetes ? ?This visit occurred during the SARS-CoV-2 public health emergency.  Safety protocols were in place, including screening questions prior to the visit, additional usage of staff PPE, and extensive cleaning of exam room while observing appropriate contact time as indicated for disinfecting solutions.   ? ?HPI ?[name] presents for f/up -  ? ?She had an episode of vaginal discharge 2 months ago.  She has had no more vaginal discharge since then and she denies vaginal bleeding or pelvic pain.  She complains of weight gain and decreased level of hearing.  She is active and denies chest pain, shortness of breath, diaphoresis, dizziness, lightheadedness, edema, or polys. ? ?Outpatient Medications Prior to Visit  ?Medication Sig Dispense Refill  ? Blood Glucose Monitoring Suppl (TRUE METRIX METER) DEVI 1 Device by Does not apply route 2 (two) times daily. 1 Device 0  ? Cholecalciferol 50 MCG (2000 UT) TABS Take 1 tablet (2,000 Units total) by mouth daily. 90 tablet 1  ? Dulaglutide (TRULICITY) 1.5 HG/9.9ME SOPN Inject 1.5 mg into the skin once a week. 6 mL 3  ? glucose blood (TRUE METRIX BLOOD GLUCOSE TEST) test strip USE AS INSTRUCTED TO CHECK BLOOD SUGAR ONE TIME DAILY 100 strip 3  ? Insulin Glargine (BASAGLAR KWIKPEN) 100 UNIT/ML Inject 16-20 Units into the skin at bedtime. (Patient taking differently: Inject 14 Units into the skin daily before breakfast.) 45 mL 3  ? Insulin Pen Needle (PEN NEEDLES) 32G X 5 MM MISC 1 Units by Does not apply route daily. 100 each 5  ? losartan-hydrochlorothiazide (HYZAAR) 50-12.5 MG tablet Take 1 tablet by mouth once daily 90 tablet 0  ? metFORMIN (GLUCOPHAGE-XR) 500 MG 24 hr tablet TAKE 2 TABLETS BY MOUTH ONCE DAILY WITH SUPPER 180 tablet 1  ? Multiple Vitamin (MULTIVITAMIN WITH MINERALS) TABS tablet Take 1 tablet by mouth daily.    ?  omeprazole (PRILOSEC) 40 MG capsule TAKE 1 CAPSULE BY MOUTH IN THE MORNING AND 1 AT BEDTIME 90 capsule 0  ? pravastatin (PRAVACHOL) 40 MG tablet TAKE 1 TABLET AT BEDTIME 90 tablet 0  ? Tetrahydrozoline HCl (VISINE OP) Apply 1 drop to eye daily as needed (dry eyes).    ? nebivolol (BYSTOLIC) 5 MG tablet Take 1 tablet (5 mg total) by mouth daily. 90 tablet 0  ? ?No facility-administered medications prior to visit.  ? ? ?ROS ?Review of Systems  ?Constitutional:  Positive for unexpected weight change. Negative for appetite change, chills, diaphoresis and fatigue.  ?HENT:  Positive for hearing loss and trouble swallowing. Negative for ear pain and sore throat.   ?Respiratory:  Negative for chest tightness, shortness of breath and wheezing.   ?Cardiovascular:  Negative for chest pain, palpitations and leg swelling.  ?Gastrointestinal:  Negative for abdominal pain, diarrhea and nausea.  ?Genitourinary:  Positive for vaginal discharge. Negative for decreased urine volume, difficulty urinating and vaginal bleeding.  ?Musculoskeletal: Negative.  Negative for myalgias.  ?Skin: Negative.   ?Neurological:  Negative for dizziness and weakness.  ?Hematological:  Negative for adenopathy. Does not bruise/bleed easily.  ?Psychiatric/Behavioral: Negative.    ? ?Objective:  ?BP 136/76 (BP Location: Left Arm, Patient Position: Sitting, Cuff Size: Large)   Pulse 84   Temp 98.3 ?F (36.8 ?C) (Oral)   Resp 16   Ht '5\' 3"'$  (1.6 m)   Wt 194 lb (88  kg)   SpO2 95%   BMI 34.37 kg/m?  ? ?BP Readings from Last 3 Encounters:  ?12/31/21 136/76  ?09/11/21 138/70  ?09/09/21 140/68  ? ? ?Wt Readings from Last 3 Encounters:  ?12/31/21 194 lb (88 kg)  ?09/11/21 187 lb (84.8 kg)  ?09/09/21 190 lb 6.4 oz (86.4 kg)  ? ? ?Physical Exam ?Vitals reviewed.  ?HENT:  ?   Right Ear: Tympanic membrane, ear canal and external ear normal. Decreased hearing noted. There is no impacted cerumen.  ?   Left Ear: Tympanic membrane, ear canal and external ear normal.  Decreased hearing noted. There is no impacted cerumen.  ?   Nose: Nose normal.  ?   Mouth/Throat:  ?   Mouth: Mucous membranes are moist.  ?Eyes:  ?   General: No scleral icterus. ?   Conjunctiva/sclera: Conjunctivae normal.  ?Cardiovascular:  ?   Rate and Rhythm: Normal rate and regular rhythm.  ?   Heart sounds: No murmur heard. ?Pulmonary:  ?   Effort: Pulmonary effort is normal.  ?   Breath sounds: No stridor. No wheezing, rhonchi or rales.  ?Abdominal:  ?   General: Abdomen is flat.  ?   Palpations: There is no mass.  ?   Tenderness: There is no abdominal tenderness. There is no guarding.  ?   Hernia: No hernia is present.  ?Musculoskeletal:     ?   General: Normal range of motion.  ?   Cervical back: Neck supple.  ?Lymphadenopathy:  ?   Cervical: No cervical adenopathy.  ?Skin: ?   General: Skin is warm.  ?   Findings: No lesion or rash.  ?Neurological:  ?   General: No focal deficit present.  ?   Mental Status: She is alert and oriented to person, place, and time.  ?Psychiatric:     ?   Mood and Affect: Mood normal.     ?   Behavior: Behavior normal.  ? ? ?Lab Results  ?Component Value Date  ? WBC 6.7 12/31/2021  ? HGB 10.9 (L) 12/31/2021  ? HCT 33.2 (L) 12/31/2021  ? PLT 276.0 12/31/2021  ? GLUCOSE 135 (H) 12/31/2021  ? CHOL 178 05/01/2021  ? TRIG 118.0 05/01/2021  ? HDL 63.50 05/01/2021  ? [place] 91 05/01/2021  ? ALT 18 05/01/2021  ? AST 19 05/01/2021  ? NA 137 12/31/2021  ? K 3.7 12/31/2021  ? CL 99 12/31/2021  ? CREATININE 0.98 12/31/2021  ? BUN 19 12/31/2021  ? CO2 31 12/31/2021  ? TSH 0.36 12/31/2021  ? HGBA1C 7.7 (H) 12/31/2021  ? MICROALBUR 1.0 05/01/2021  ? ? ?No results found. ? ?Assessment & Plan:  ? ?Kalee was seen today for anemia, hypertension and diabetes. ? ?Diagnoses and all orders for this visit: ? ?Essential hypertension- Her blood pressure is adequately well controlled. ?-     Basic metabolic panel; Future ?-     CBC with Differential/Platelet; Future ?-     CBC with  Differential/Platelet ?-     Basic metabolic panel ? ?Insulin-requiring or dependent type II diabetes mellitus ([place])- Her A1c is up to 7.7%.  I recommended that she add an SGLT2 inhibitor. ?-     Basic metabolic panel; Future ?-     Hemoglobin A1c; Future ?-     Hemoglobin A1c ?-     Basic metabolic panel ?-     FARXIGA 10 MG TABS tablet; Take 1 tablet (10 mg total) by mouth daily before breakfast. ? ?  Multiple thyroid nodules- She is euthyroid. ?-     TSH; Future ?-     TSH ? ?Stage 3a chronic kidney disease ([place])- Will add dapagliflozin for renal protection. ?-     Basic metabolic panel; Future ?-     CBC with Differential/Platelet; Future ?-     Urinalysis, Routine w reflex microscopic; Future ?-     Urinalysis, Routine w reflex microscopic ?-     CBC with Differential/Platelet ?-     Basic metabolic panel ?-     FARXIGA 10 MG TABS tablet; Take 1 tablet (10 mg total) by mouth daily before breakfast. ? ?Deficiency anemia ? ?Bilateral hearing loss, unspecified hearing loss type ?-     Ambulatory referral to ENT ? ?Vaginal discharge ?-     Ambulatory referral to Gynecology ? ?Type 2 diabetes mellitus with stage 3a chronic kidney disease, without long-term current use of insulin ([place]) ?-     FARXIGA 10 MG TABS tablet; Take 1 tablet (10 mg total) by mouth daily before breakfast. ? ? ?I have discontinued Siera P. Hanlon's nebivolol. I am also having her start on Farxiga. Additionally, I am having her maintain her Pen Needles, True Metrix Meter, Tetrahydrozoline HCl (VISINE OP), Cholecalciferol, Trulicity, Basaglar KwikPen, multivitamin with minerals, metFORMIN, losartan-hydrochlorothiazide, pravastatin, True Metrix Blood Glucose Test, and omeprazole. ? ?Meds ordered this encounter  ?Medications  ? FARXIGA 10 MG TABS tablet  ?  Sig: Take 1 tablet (10 mg total) by mouth daily before breakfast.  ?  Dispense:  90 tablet  ?  Refill:  1  ? ? ? ?Follow-up: Return in about 6 months (around 07/03/2022). ? ?Scarlette Calico, MD ?

## 2022-01-01 LAB — URINALYSIS, ROUTINE W REFLEX MICROSCOPIC
Bilirubin Urine: NEGATIVE
Hgb urine dipstick: NEGATIVE
Ketones, ur: NEGATIVE
Nitrite: NEGATIVE
RBC / HPF: NONE SEEN (ref 0–?)
Specific Gravity, Urine: 1.01 (ref 1.000–1.030)
Total Protein, Urine: NEGATIVE
Urine Glucose: 100 — AB
Urobilinogen, UA: 0.2 (ref 0.0–1.0)
pH: 6 (ref 5.0–8.0)

## 2022-01-01 MED ORDER — FARXIGA 10 MG PO TABS: 10.0000 mg | ORAL_TABLET | Freq: Every day | ORAL | 1 refills | Status: DC

## 2022-01-04 MED ORDER — SHINGRIX 50 MCG/0.5ML IM SUSR: 0.5000 mL | Freq: Once | INTRAMUSCULAR | 1 refills | Status: DC

## 2022-01-04 MED ORDER — BOOSTRIX 5-2.5-18.5 LF-MCG/0.5 IM SUSP: 0.5000 mL | Freq: Once | INTRAMUSCULAR | 0 refills | Status: AC

## 2022-01-09 ENCOUNTER — Other Ambulatory Visit: Payer: Self-pay

## 2022-01-09 ENCOUNTER — Ambulatory Visit: Payer: Medicare PPO | Admitting: Internal Medicine

## 2022-01-09 ENCOUNTER — Encounter: Payer: Self-pay | Admitting: Internal Medicine

## 2022-01-09 VITALS — BP 144/78 | HR 94 | Ht 63.0 in | Wt 188.6 lb

## 2022-01-09 DIAGNOSIS — E0590 Thyrotoxicosis, unspecified without thyrotoxic crisis or storm: Secondary | ICD-10-CM | POA: Insufficient documentation

## 2022-01-09 DIAGNOSIS — N1831 Chronic kidney disease, stage 3a: Secondary | ICD-10-CM

## 2022-01-09 DIAGNOSIS — E042 Nontoxic multinodular goiter: Secondary | ICD-10-CM

## 2022-01-09 DIAGNOSIS — E1122 Type 2 diabetes mellitus with diabetic chronic kidney disease: Secondary | ICD-10-CM

## 2022-01-09 NOTE — Patient Instructions (Addendum)
Please continue: ?- Metformin ER 1000 mg at bedtime. ?- Basaglar 16 units in am ? ?Increase: ?- Trulicity 3 mg weekly  ? ?Please return in 3-4 months with your sugar log.  ?

## 2022-01-09 NOTE — Progress Notes (Signed)
Patient ID: Bridget Butler, female   DOB: 1946/05/18, 76 y.o.   MRN: [id number]  ? ?This visit occurred during the SARS-CoV-2 public health emergency.  Safety protocols were in place, including screening questions prior to the visit, additional usage of staff PPE, and extensive cleaning of exam room while observing appropriate contact time as indicated for disinfecting solutions.  ? ?HPI  ?Bridget Butler is a 76 y.o.-year-old female, initially referred by her PCP, Dr. Brigitte Pulse, returning for follow-up for toxic thyroid adenomas with subclinical thyrotoxicosis and now also uncontrolled DM2, insulin-dependent, with complications (CKD, DR, PN).  Last visit 4 months ago. ? ?Interim history: ?No increased urination, blurry vision, nausea, chest pain. ?Started vinegar water in am and yoghurt 3x a day. ? ?Toxic thyroid nodules: ? ?Reviewed history: ?She was diagnosed with hyperthyroidism approximately 2010. She was seeing Dr. Jeanann Lewandowsky - he retired. She was started on medication (cannot remember name) >> felt "off" >> stopped in ~2010. ? ?Reviewed her TFTs: ?Lab Results  ?Component Value Date  ? TSH 0.36 12/31/2021  ? TSH 0.38 01/14/2021  ? TSH 0.57 07/18/2020  ? TSH 0.148 (L) 06/04/2020  ? TSH 0.03 (L) 04/11/2019  ? TSH 0.03 (L) 10/29/2018  ? TSH 0.054 (L) 07/08/2018  ? TSH 0.118 (L) 03/29/2018  ? FREET4 0.90 01/14/2021  ? FREET4 0.66 07/18/2020  ? FREET4 0.93 06/08/2020  ? FREET4 0.84 04/11/2019  ? FREET4 0.97 10/29/2018  ? FREET4 1.05 07/08/2018  ? T3FREE 3.0 01/14/2021  ? T3FREE 3.1 07/18/2020  ? T3FREE 3.0 04/11/2019  ? T3FREE 3.8 10/29/2018  ? T3FREE 3.3 07/08/2018  ? ?Her TSI antibodies were not elevated: ?Lab Results  ?Component Value Date  ? TSI <89 10/29/2018  ?  ?Thyroid uptake (08/28/2007):  ?There is heterogeneous uptake within a normal sized gland. There are regions of photopenia within the bilateral upper poles and in the left lower pole. The most intense uptake is in the mid pole of the right lobe of the thyroid  gland. Decreased uptake in the inferior pole of the right lobe of the thyroid gland additionally.   ?Calculated 24-hour I-131 uptake is equal to 25.0%, which is normal (normal 10-30%).   ?IMPRESSION:   ?1. Heterogeneous uptake within the thyroid gland could represent resolving subacute thyroiditis in patient with a normal uptake.  ?2. Cannot exclude a multinodular goiter if TSH continues to remain depressed ? ?Labs at last visit, we obtained a thyroid uptake and scan (11/30/2018): Normal uptake but multinodular goiter with warm nodules at inferior thyroid poles: ? 4 hour I-123 uptake = 8.6% (normal 5-20%) ?24 hour I-123 uptake = 25.3% (normal 10-30%) ?  ?IMPRESSION: ?Normal 4 hour and 24 hour radio iodine uptakes. ?  ?Multinodular thyroid gland with warm nodules at the inferior poles ?of both thyroid lobes, corresponding to dominant masses identified ?on a remote thyroid ultrasound consistent with adenomas. ? ?No FH of thyroid disease or thyroid cancer. No h/o radiation tx to head or neck. ? ?No herbal supplements. No Biotin use. No recent steroids use.  ? ?Multinodular goiter per ultrasound from 06/28/2013.   ?Biopsies were benign on 07/13/2013. ? ?Pt denies: ?- feeling nodules in neck ?- hoarseness ?- dysphagia ?- choking ?- SOB with lying down ? ?DM2: ?-Diagnosed: 9092 ?- insulin started: ~2006 ? ?Her diabetes became uncontrolled after her first laryngeal surgery in 03/2020.  She was on steroids at the time of her surgery. ? ?Reviewed HbA1c levels: ?Lab Results  ?Component Value Date  ? HGBA1C  7.7 (H) 12/31/2021  ? HGBA1C 7.5 (A) 09/09/2021  ? HGBA1C 7.7 (H) 05/01/2021  ? HGBA1C 7.5 (A) 01/14/2021  ? HGBA1C 7.7 (A) 10/15/2020  ? HGBA1C 9.1 (A) 08/23/2020  ? HGBA1C 10.4 (H) 07/11/2020  ? HGBA1C 7.0 (H) 03/29/2020  ? HGBA1C 7.1 (H) 12/05/2019  ? HGBA1C 6.4 (H) 06/03/2019  ? ?She is on: ?- Janumet 50-1000 mg at bedtime >> Metformin ER 1000 mg at bedtime ?- Trulicity 4.97 >> 1.5 mg weekly -gets these through patient  assistance  ?- Basaglar 20 units at bedtime >> in am >> 16 >> 14 >> 16 >> 14-16 units in a.m.  ?She had GI intolerance with regular Metformin. ?Coral Ceo >> $$$. ? ?Pt checks her sugars once a day: ?- am:  69, 80-90, 145 >> 92-127, 159 >> 98-148 >> 96-132, 158 (late dinner) ?- 2h after b'fast: n/c ?- before lunch: n/c >> 154 ?- 2h after lunch: n/c >> 178 >> 154, 159 >> 83-216, 229 ?- before dinner: n/c >> 211>> n/c ?- 2h after dinner: n/c ?- bedtime: 300s - last 4 days: n/c >> n/c >> 152 >> 252 >> 133-229, 237 ?- nighttime: n/c ?Lowest sugar was 63 >> 69 >> 96; she has hypoglycemia awareness at 70.  ?Highest sugar was 400s >> .[name] KitchenMarland Kitchen252 (no meds) >> 237. ? ?Glucometer: True Metrix ? ?Pt's meals are: ?- Breakfast: eggs, [place] sausage or oatmeal ?- Lunch: salad + eggs + [place] meat (no pork) ?- Dinner: veggies + chicken/turkey and fish ?- Snacks: not after sx ? ?-+ Mild CKD, last BUN/creatinine:  ?Lab Results  ?Component Value Date  ? BUN 19 12/31/2021  ? BUN 18 05/01/2021  ? CREATININE 0.98 12/31/2021  ? CREATININE 1.04 05/01/2021  ?On losartan 50 ? ?-+ HL; last set of lipids: ?Lab Results  ?Component Value Date  ? CHOL 178 05/01/2021  ? HDL 63.50 05/01/2021  ? [place] 91 05/01/2021  ? TRIG 118.0 05/01/2021  ? CHOLHDL 3 05/01/2021  ?On pravastatin 40. ? ?- last eye exam was on 03/01/2021: + DR, stable reportedly. Sees retina specialist. Dr. Sherlynn Stalls. ? ?-+ Numbness and tingling in her feet. ? ?On ASA 81 ? ?Pt has FH of DM in mother. ? ?She also has a history of GERD, osteopenia, microlaryngoscopy for subglottic stenosis 06 and 06/2020. ? ?ROS: ? + see HPI, + hypoacusis ?Neurological: no tremors/+ improved numbness and tingling/no dizziness ? ?I reviewed pt's medications, allergies, PMH, social hx, family hx, and changes were documented in the history of present illness. Otherwise, unchanged from my initial visit note. ? ?Past Medical History:  ?Diagnosis Date  ? Cataract   ? Diabetes mellitus without  complication ([place])   ? Dyspnea   ? on exertion  ? GERD (gastroesophageal reflux disease)   ? Hyperlipidemia   ? Hypertension   ? Multiple thyroid nodules   ? Thyroid disease   ? ?Past Surgical History:  ?Procedure Laterality Date  ? CHOLECYSTECTOMY    ? COSMETIC SURGERY Bilateral   ? eyelids  ? EYE SURGERY    ? GALLBLADDER SURGERY    ? MICROLARYNGOSCOPY WITH CO2 LASER AND EXCISION OF VOCAL CORD LESION N/A 04/02/2020  ? Procedure: MICROLARYNGOSCOPY WITH DILATION AND CO2 LASER AND EXCISION OF VOCAL CORD LESION w/MITOMYCIN C;  Surgeon: Melida Quitter, MD;  Location: [place];  Service: ENT;  Laterality: N/A;  ? MICROLARYNGOSCOPY WITH DILATION N/A 05/23/2020  ? Procedure: MICROLARYNGOSCOPY W/ DILATION AND JET VENTILATION;  Surgeon: Melida Quitter, MD;  Location: [facility];  Service: ENT;  Laterality: N/A;  ? MICROLARYNGOSCOPY WITH DILATION N/A 07/13/2020  ? Procedure: MICROLARYNGOSCOPY with Jet Ventilation;  Surgeon: Melida Quitter, MD;  Location: [place] [facility];  Service: ENT;  Laterality: N/A;  ? TRACHEOSTOMY TUBE PLACEMENT N/A 04/02/2020  ? Procedure: Awake TRACHEOSTOMY;  Surgeon: Melida Quitter, MD;  Location: [place];  Service: ENT;  Laterality: N/A;  ? TUBAL LIGATION    ? ?Social History  ? ?Socioeconomic History  ? Marital status: Widowed  ?  Spouse name: Not on file  ? Number of children: 3  ? Years of education: Not on file  ? Highest education level: Not on file  ?Occupational History  ? Not on file  ?Tobacco Use  ? Smoking status: Never  ? Smokeless tobacco: Never  ?Vaping Use  ? Vaping Use: Never used  ?Substance and Sexual Activity  ? Alcohol use: No  ? Drug use: No  ? Sexual activity: Not Currently  ?  Partners: Male  ?Other Topics Concern  ? Not on file  ?Social History Narrative  ? Mrs Goerke is a 76 year old Jehovah witness patient   ? She receives support from her daughter primarily who also assists with transportation to medical appointments.   ? She is presently independent with her ADLs and iADLs  ?   ? ?Social Determinants of  Health  ? ?Financial Resource Strain: Low Risk   ? Difficulty of Paying Living Expenses: Not hard at all  ?Food Insecurity: No Food Insecurity  ? Worried About Charity fundraiser in the Last Year: Never true  ?

## 2022-01-28 ENCOUNTER — Other Ambulatory Visit: Payer: Self-pay | Admitting: Internal Medicine

## 2022-01-28 ENCOUNTER — Telehealth: Payer: Self-pay

## 2022-01-28 DIAGNOSIS — N898 Other specified noninflammatory disorders of vagina: Secondary | ICD-10-CM

## 2022-01-28 NOTE — Telephone Encounter (Signed)
Pt called to check status of referrals for both GYN and ENT. ? ?I advised pt the the ENT referral was sent to [facility]. ? ?I advised the pt that the GYN made several attempts to reach her and they documented that they closed the referral as of 01/23/22. ? ?Pt is requesting  a new referral. ? ?Please advise ?

## 2022-01-28 NOTE — Telephone Encounter (Signed)
I called the pt and got her scheduled for 02/04/22 @ 1120 ?

## 2022-02-04 ENCOUNTER — Other Ambulatory Visit (HOSPITAL_COMMUNITY)
Admission: RE | Admit: 2022-02-04 | Discharge: 2022-02-04 | Disposition: A | Discharge status: Home / Self Care | Payer: Medicare PPO | Source: Ambulatory Visit | Attending: Internal Medicine | Admitting: Internal Medicine

## 2022-02-04 ENCOUNTER — Ambulatory Visit (INDEPENDENT_AMBULATORY_CARE_PROVIDER_SITE_OTHER): Payer: Medicare PPO | Admitting: Internal Medicine

## 2022-02-04 ENCOUNTER — Encounter: Payer: Self-pay | Admitting: Internal Medicine

## 2022-02-04 VITALS — BP 144/80 | HR 91 | Temp 98.3°F | Ht 63.0 in | Wt 191.0 lb

## 2022-02-04 DIAGNOSIS — I10 Essential (primary) hypertension: Secondary | ICD-10-CM | POA: Diagnosis not present

## 2022-02-04 DIAGNOSIS — N898 Other specified noninflammatory disorders of vagina: Secondary | ICD-10-CM | POA: Diagnosis not present

## 2022-02-04 NOTE — Patient Instructions (Signed)

## 2022-02-04 NOTE — Progress Notes (Signed)
? ?Subjective:  ?Patient ID: Bridget Butler, female    DOB: 1946-02-18  Age: 76 y.o. MRN: [id number] ? ?CC: Vaginal Discharge ? ? ?HPI ?Bridget Butler presents for f/up - ? ?She complains of a several month history of intermittent vaginal discharge.  She denies vaginal itching or bleeding.  She is not sexually active.  She does not experience vaginal dryness. ? ?Outpatient Medications Prior to Visit  ?Medication Sig Dispense Refill  ?? Blood Glucose Monitoring Suppl (TRUE METRIX METER) DEVI 1 Device by Does not apply route 2 (two) times daily. 1 Device 0  ?? Cholecalciferol 50 MCG (2000 UT) TABS Take 1 tablet (2,000 Units total) by mouth daily. 90 tablet 1  ?? Dulaglutide (TRULICITY) 1.5 XH/3.7JI SOPN Inject 1.5 mg into the skin once a week. 6 mL 3  ?? FARXIGA 10 MG TABS tablet Take 1 tablet (10 mg total) by mouth daily before breakfast. 90 tablet 1  ?? glucose blood (TRUE METRIX BLOOD GLUCOSE TEST) test strip USE AS INSTRUCTED TO CHECK BLOOD SUGAR ONE TIME DAILY 100 strip 3  ?? Insulin Glargine (BASAGLAR KWIKPEN) 100 UNIT/ML Inject 16-20 Units into the skin at bedtime. (Patient taking differently: Inject 14 Units into the skin daily before breakfast.) 45 mL 3  ?? Insulin Pen Needle (PEN NEEDLES) 32G X 5 MM MISC 1 Units by Does not apply route daily. 100 each 5  ?? losartan-hydrochlorothiazide (HYZAAR) 50-12.5 MG tablet Take 1 tablet by mouth once daily 90 tablet 0  ?? metFORMIN (GLUCOPHAGE-XR) 500 MG 24 hr tablet TAKE 2 TABLETS BY MOUTH ONCE DAILY WITH SUPPER 180 tablet 1  ?? Multiple Vitamin (MULTIVITAMIN WITH MINERALS) TABS tablet Take 1 tablet by mouth daily.    ?? omeprazole (PRILOSEC) 40 MG capsule TAKE 1 CAPSULE BY MOUTH IN THE MORNING AND 1 AT BEDTIME 90 capsule 0  ?? pravastatin (PRAVACHOL) 40 MG tablet TAKE 1 TABLET AT BEDTIME 90 tablet 0  ?? Tetrahydrozoline HCl (VISINE OP) Apply 1 drop to eye daily as needed (dry eyes).    ? ?No facility-administered medications prior to visit.  ? ? ?ROS ?Review of Systems   ?Constitutional: Negative.  Negative for chills and fever.  ?HENT: Negative.    ?Eyes: Negative.   ?Respiratory:  Negative for cough and shortness of breath.   ?Cardiovascular:  Negative for chest pain, palpitations and leg swelling.  ?Gastrointestinal:  Negative for abdominal pain, constipation, diarrhea, nausea and vomiting.  ?Endocrine: Negative.   ?Genitourinary:  Positive for vaginal discharge. Negative for difficulty urinating, dysuria, flank pain, hematuria, menstrual problem, vaginal bleeding and vaginal pain.  ?Musculoskeletal: Negative.   ?Skin: Negative.   ?Neurological: Negative.  Negative for dizziness, weakness and light-headedness.  ?Hematological:  Negative for adenopathy. Does not bruise/bleed easily.  ?Psychiatric/Behavioral: Negative.    ? ?Objective:  ?BP (!) 144/80 (BP Location: Left Arm, Patient Position: Sitting, Cuff Size: Large)   Pulse 91   Temp 98.3 ?F (36.8 ?C) (Oral)   Ht '5\' 3"'$  (1.6 m)   Wt 191 lb (86.6 kg)   SpO2 98%   BMI 33.83 kg/m?  ? ?BP Readings from Last 3 Encounters:  ?02/04/22 (!) 144/80  ?01/09/22 (!) 144/78  ?12/31/21 136/76  ? ? ?Wt Readings from Last 3 Encounters:  ?02/04/22 191 lb (86.6 kg)  ?01/09/22 188 lb 9.6 oz (85.5 kg)  ?12/31/21 194 lb (88 kg)  ? ? ?Physical Exam ?Vitals reviewed. Exam conducted with a chaperone present (Shirron).  ?HENT:  ?   Nose: Nose normal.  ?  Mouth/Throat:  ?   Mouth: Mucous membranes are moist.  ?Eyes:  ?   General: No scleral icterus. ?   Conjunctiva/sclera: Conjunctivae normal.  ?Cardiovascular:  ?   Rate and Rhythm: Normal rate and regular rhythm.  ?   Heart sounds: No murmur heard. ?Pulmonary:  ?   Effort: Pulmonary effort is normal.  ?   Breath sounds: No stridor. No wheezing, rhonchi or rales.  ?Abdominal:  ?   General: Abdomen is flat.  ?   Palpations: There is no mass.  ?   Tenderness: There is no abdominal tenderness. There is no guarding.  ?   Hernia: No hernia is present. There is no hernia in the left inguinal area or  right inguinal area.  ?Genitourinary: ?   Exam position: Supine.  ?   Pubic Area: No rash.   ?   Labia:     ?   Right: No rash, tenderness, lesion or injury.     ?   Left: No rash, tenderness, lesion or injury.   ?   Urethra: No prolapse, urethral pain, urethral swelling or urethral lesion.  ?   Vagina: Normal. No signs of injury and foreign body. No vaginal discharge, erythema, tenderness, bleeding, lesions or prolapsed vaginal walls.  ?   Cervix: No cervical motion tenderness, discharge, friability, lesion, erythema, cervical bleeding or eversion.  ?   Uterus: Normal.   ?   Adnexa: Right adnexa normal and left adnexa normal.    ?   Right: No mass, tenderness or fullness.      ?   Left: No mass, tenderness or fullness.    ?   Rectum: Normal. Guaiac result negative. No mass, tenderness, anal fissure, external hemorrhoid or internal hemorrhoid. Normal anal tone.  ?Musculoskeletal:     ?   General: Normal range of motion.  ?   Cervical back: Neck supple.  ?   Right lower leg: No edema.  ?   Left lower leg: No edema.  ?Lymphadenopathy:  ?   Cervical: No cervical adenopathy.  ?   Lower Body: No right inguinal adenopathy. No left inguinal adenopathy.  ?Skin: ?   General: Skin is warm and dry.  ?Neurological:  ?   General: No focal deficit present.  ?   Mental Status: She is alert.  ? ? ?Lab Results  ?Component Value Date  ? WBC 6.7 12/31/2021  ? HGB 10.9 (L) 12/31/2021  ? HCT 33.2 (L) 12/31/2021  ? PLT 276.0 12/31/2021  ? GLUCOSE 135 (H) 12/31/2021  ? CHOL 178 05/01/2021  ? TRIG 118.0 05/01/2021  ? HDL 63.50 05/01/2021  ? [place] 91 05/01/2021  ? ALT 18 05/01/2021  ? AST 19 05/01/2021  ? NA 137 12/31/2021  ? K 3.7 12/31/2021  ? CL 99 12/31/2021  ? CREATININE 0.98 12/31/2021  ? BUN 19 12/31/2021  ? CO2 31 12/31/2021  ? TSH 0.36 12/31/2021  ? HGBA1C 7.7 (H) 12/31/2021  ? MICROALBUR 1.0 05/01/2021  ? ? ?No results found. ? ?Assessment & Plan:  ? ?Bridget Butler was seen today for vaginal discharge. ? ?Diagnoses and all orders for  this visit: ? ?Vaginal discharge- Her exam and Pap are reassuring.  No signs of cancer or infection are identified. ?-     Cytology - PAP( [place]) ? ?Essential hypertension- Her blood pressure is adequately well controlled. ? ? ?I am having Anays P. Montee maintain her Pen Needles, True Metrix Meter, Tetrahydrozoline HCl (VISINE OP), Cholecalciferol, Trulicity, [employer],  multivitamin with minerals, metFORMIN, losartan-hydrochlorothiazide, pravastatin, True Metrix Blood Glucose Test, omeprazole, and Farxiga. ? ?No orders of the defined types were placed in this encounter. ? ? ? ?Follow-up: Return in about 3 months (around 05/06/2022). ? ?Scarlette Calico, MD ?

## 2022-02-05 LAB — HM PAP SMEAR

## 2022-02-06 LAB — CYTOLOGY - PAP
Comment: NEGATIVE
Diagnosis: REACTIVE
Trichomonas: NEGATIVE

## 2022-02-12 NOTE — Progress Notes (Signed)
Vaginal discharge

## 2022-02-25 ENCOUNTER — Other Ambulatory Visit: Payer: Self-pay | Admitting: Internal Medicine

## 2022-02-25 DIAGNOSIS — K219 Gastro-esophageal reflux disease without esophagitis: Secondary | ICD-10-CM

## 2022-03-10 ENCOUNTER — Other Ambulatory Visit: Payer: Self-pay | Admitting: Internal Medicine

## 2022-03-10 DIAGNOSIS — I10 Essential (primary) hypertension: Secondary | ICD-10-CM

## 2022-04-21 ENCOUNTER — Other Ambulatory Visit: Payer: Self-pay | Admitting: Internal Medicine

## 2022-04-24 DIAGNOSIS — H903 Sensorineural hearing loss, bilateral: Secondary | ICD-10-CM | POA: Diagnosis not present

## 2022-05-01 ENCOUNTER — Ambulatory Visit: Payer: Medicare PPO | Admitting: Internal Medicine

## 2022-05-01 ENCOUNTER — Encounter: Payer: Self-pay | Admitting: Internal Medicine

## 2022-05-01 VITALS — BP 140/78 | HR 93 | Ht 63.0 in | Wt 191.2 lb

## 2022-05-01 DIAGNOSIS — E042 Nontoxic multinodular goiter: Secondary | ICD-10-CM | POA: Diagnosis not present

## 2022-05-01 DIAGNOSIS — N1831 Chronic kidney disease, stage 3a: Secondary | ICD-10-CM | POA: Diagnosis not present

## 2022-05-01 DIAGNOSIS — E0590 Thyrotoxicosis, unspecified without thyrotoxic crisis or storm: Secondary | ICD-10-CM | POA: Diagnosis not present

## 2022-05-01 DIAGNOSIS — E1122 Type 2 diabetes mellitus with diabetic chronic kidney disease: Secondary | ICD-10-CM

## 2022-05-01 LAB — POCT GLYCOSYLATED HEMOGLOBIN (HGB A1C): Hemoglobin A1C: 7.2 % — AB (ref 4.0–5.6)

## 2022-05-01 MED ORDER — TRULICITY 3 MG/0.5ML ~~LOC~~ SOAJ: 3.0000 mg | SUBCUTANEOUS | 3 refills | Status: DC

## 2022-05-01 NOTE — Patient Instructions (Signed)
Please continue: - Metformin ER 1000 mg at bedtime. - Basaglar 16 units in am  Increase: - Trulicity 3 mg weekly  (2x 1.5 mg weekly)  Please return in 3-4 months with your sugar log.

## 2022-05-01 NOTE — Progress Notes (Signed)
00. Patient ID: Bridget Butler, female   DOB: 21-Sep-1946, 76 y.o.   MRN: [id number]   HPI  Bridget Butler is a 76 y.o.-year-old female, initially referred by her PCP, Dr. Brigitte Pulse, returning for follow-up for toxic thyroid adenomas with subclinical thyrotoxicosis and now also uncontrolled DM2, insulin-dependent, with complications (CKD, DR, PN).  Last visit 4 months ago.  Interim history: No increased urination, blurry vision, nausea, chest pain. She again started to to drink vinegar before her coffee.  Toxic thyroid nodules:  Reviewed history: She was diagnosed with hyperthyroidism approximately 2010. She was seeing Dr. Jeanann Lewandowsky - he retired. She was started on medication (cannot remember name) >> felt "off" >> stopped in ~2010.  Reviewed her TFTs: Lab Results  Component Value Date   TSH 0.36 12/31/2021   TSH 0.38 01/14/2021   TSH 0.57 07/18/2020   TSH 0.148 (L) 06/04/2020   TSH 0.03 (L) 04/11/2019   TSH 0.03 (L) 10/29/2018   TSH 0.054 (L) 07/08/2018   TSH 0.118 (L) 03/29/2018   FREET4 0.90 01/14/2021   FREET4 0.66 07/18/2020   FREET4 0.93 06/08/2020   FREET4 0.84 04/11/2019   FREET4 0.97 10/29/2018   FREET4 1.05 07/08/2018   T3FREE 3.0 01/14/2021   T3FREE 3.1 07/18/2020   T3FREE 3.0 04/11/2019   T3FREE 3.8 10/29/2018   T3FREE 3.3 07/08/2018   Her TSI antibodies were not elevated: Lab Results  Component Value Date   TSI <89 10/29/2018    Thyroid uptake (08/28/2007):  There is heterogeneous uptake within a normal sized gland. There are regions of photopenia within the bilateral upper poles and in the left lower pole. The most intense uptake is in the mid pole of the right lobe of the thyroid gland. Decreased uptake in the inferior pole of the right lobe of the thyroid gland additionally.   Calculated 24-hour I-131 uptake is equal to 25.0%, which is normal (normal 10-30%).   IMPRESSION:   1. Heterogeneous uptake within the thyroid gland could represent resolving subacute  thyroiditis in patient with a normal uptake.  2. Cannot exclude a multinodular goiter if TSH continues to remain depressed  Labs at last visit, we obtained a thyroid uptake and scan (11/30/2018): Normal uptake but multinodular goiter with warm nodules at inferior thyroid poles:  4 hour I-123 uptake = 8.6% (normal 5-20%) 24 hour I-123 uptake = 25.3% (normal 10-30%)   IMPRESSION: Normal 4 hour and 24 hour radio iodine uptakes.   Multinodular thyroid gland with warm nodules at the inferior poles of both thyroid lobes, corresponding to dominant masses identified on a remote thyroid ultrasound consistent with adenomas.  No FH of thyroid disease or thyroid cancer. No h/o radiation tx to head or neck. No herbal supplements. No Biotin use. No recent steroids use.   Multinodular goiter per ultrasound from 06/28/2013.   Biopsies were benign on 07/13/2013.  Pt denies: - feeling nodules in neck - hoarseness - dysphagia - choking  DM2: -Diagnosed: 9092 - insulin started: ~2006  Her diabetes became uncontrolled after her first laryngeal surgery in 03/2020.  She was on steroids at the time of her surgery.  Reviewed HbA1c levels: Lab Results  Component Value Date   HGBA1C 7.7 (H) 12/31/2021   HGBA1C 7.5 (A) 09/09/2021   HGBA1C 7.7 (H) 05/01/2021   HGBA1C 7.5 (A) 01/14/2021   HGBA1C 7.7 (A) 10/15/2020   HGBA1C 9.1 (A) 08/23/2020   HGBA1C 10.4 (H) 07/11/2020   HGBA1C 7.0 (H) 03/29/2020   HGBA1C 7.1 (H)  12/05/2019   HGBA1C 6.4 (H) 06/03/2019   She is on: - Janumet 50-1000 mg at bedtime >> Metformin ER 1000 mg at bedtime - Trulicity 7.40 >> 1.5 >> 3 mg >> she decreased the dose to 1.5 mg weekly -gets these through patient assistance  - Basaglar 20 units at bedtime >> in am >> 16 >> 14 >> 16 >> 16 units in a.m.  She had GI intolerance with regular Metformin. Coral Ceo >> $$$.  Pt checks her sugars once a day: - am:  92-127, 159 >> 98-148 >> 96-132, 158 (late dinner) >> 90s-109,  200 - 2h after b'fast: n/c - before lunch: n/c >> 154 >> n/c - 2h after lunch: n/c >> 178 >> 154, 159 >> 83-216, 229 >> n/c - before dinner: n/c >> 211>> n/c - 2h after dinner: n/c - bedtime: n/c >> 152 >> 252 >> 133-229, 237 >> n/c - nighttime: n/c Lowest sugar was 63 >> 69 >> 96 >> 90; she has hypoglycemia awareness at 70.  Highest sugar was 400s >> .[name] KitchenMarland Kitchen252 (no meds) >> 237 >> 200 (ate late).  Glucometer: True Metrix  Pt's meals are: - Breakfast: eggs, [place] sausage or oatmeal - Lunch: salad + eggs + [place] meat (no pork) - Dinner: veggies + chicken/turkey and fish - Snacks: not after sx  -+ Mild CKD, last BUN/creatinine:  Lab Results  Component Value Date   BUN 19 12/31/2021   BUN 18 05/01/2021   CREATININE 0.98 12/31/2021   CREATININE 1.04 05/01/2021  On losartan 50  -+ HL; last set of lipids: Lab Results  Component Value Date   CHOL 178 05/01/2021   HDL 63.50 05/01/2021   LDLCALC 91 05/01/2021   TRIG 118.0 05/01/2021   CHOLHDL 3 05/01/2021  On pravastatin 40.  - last eye exam was on 2023: + DR, stable reportedly. Sees retina specialist. Dr. Sherlynn Stalls.  -+ Numbness and tingling in her feet.  Last foot exam 12/2021.  On ASA 81  Pt has FH of DM in mother.  She also has a history of GERD, osteopenia, microlaryngoscopy for subglottic stenosis 06 and 06/2020.  ROS:  + see HPI Neurological: no tremors/+ improved numbness and tingling/no dizziness  I reviewed pt's medications, allergies, PMH, social hx, family hx, and changes were documented in the history of present illness. Otherwise, unchanged from my initial visit note.  Past Medical History:  Diagnosis Date   Cataract    Diabetes mellitus without complication (HCC)    Dyspnea    on exertion   GERD (gastroesophageal reflux disease)    Hyperlipidemia    Hypertension    Multiple thyroid nodules    Thyroid disease    Past Surgical History:  Procedure Laterality Date   CHOLECYSTECTOMY     COSMETIC  SURGERY Bilateral    eyelids   EYE SURGERY     GALLBLADDER SURGERY     MICROLARYNGOSCOPY WITH CO2 LASER AND EXCISION OF VOCAL CORD LESION N/A 04/02/2020   Procedure: MICROLARYNGOSCOPY WITH DILATION AND CO2 LASER AND EXCISION OF VOCAL CORD LESION w/MITOMYCIN C;  Surgeon: Melida Quitter, MD;  Location: [facility];  Service: ENT;  Laterality: N/A;   MICROLARYNGOSCOPY WITH DILATION N/A 05/23/2020   Procedure: MICROLARYNGOSCOPY W/ DILATION AND JET VENTILATION;  Surgeon: Melida Quitter, MD;  Location: [facility];  Service: ENT;  Laterality: N/A;   MICROLARYNGOSCOPY WITH DILATION N/A 07/13/2020   Procedure: MICROLARYNGOSCOPY with Jet Ventilation;  Surgeon: Melida Quitter, MD;  Location: [place] [facility];  Service: ENT;  Laterality: N/A;   TRACHEOSTOMY TUBE PLACEMENT N/A 04/02/2020   Procedure: Awake TRACHEOSTOMY;  Surgeon: Melida Quitter, MD;  Location: [facility];  Service: ENT;  Laterality: N/A;   TUBAL LIGATION     Social History   Socioeconomic History   Marital status: Widowed    Spouse name: Not on file   Number of children: 3   Years of education: Not on file   Highest education level: Not on file  Occupational History   Not on file  Tobacco Use   Smoking status: Never   Smokeless tobacco: Never  Vaping Use   Vaping Use: Never used  Substance and Sexual Activity   Alcohol use: No   Drug use: No   Sexual activity: Not Currently    Partners: Male  Other Topics Concern   Not on file  Social History Narrative   Mrs Denman is a 76 year old Jehovah witness patient    She receives support from her daughter primarily who also assists with transportation to medical appointments.    She is presently independent with her ADLs and iADLs      Social Determinants of Health   Financial Resource Strain: Low Risk  (09/11/2021)   Overall Financial Resource Strain (CARDIA)    Difficulty of Paying Living Expenses: Not hard at all  Food Insecurity: No Food Insecurity (09/11/2021)   Hunger Vital Sign    Worried About Running  Out of Food in the Last Year: Never true    Ran Out of Food in the Last Year: Never true  Transportation Needs: No Transportation Needs (09/11/2021)   PRAPARE - Hydrologist (Medical): No    Lack of Transportation (Non-Medical): No  Physical Activity: Sufficiently Active (09/11/2021)   Exercise Vital Sign    Days of Exercise per Week: 7 days    Minutes of Exercise per Session: 30 min  Stress: No Stress Concern Present (09/11/2021)   [place]    Feeling of Stress : Not at all  Social Connections: Moderately Integrated (09/11/2021)   Social Connection and Isolation Panel [NHANES]    Frequency of Communication with Friends and Family: More than three times a week    Frequency of Social Gatherings with Friends and Family: More than three times a week    Attends Religious Services: More than 4 times per year    Active Member of [employer] or Organizations: Yes    Attends Archivist Meetings: More than 4 times per year    Marital Status: Widowed  Intimate Partner Violence: Not At Risk (09/11/2021)   Humiliation, Afraid, Rape, and Kick questionnaire    Fear of Current or Ex-Partner: No    Emotionally Abused: No    Physically Abused: No    Sexually Abused: No   Current Outpatient Medications on File Prior to Visit  Medication Sig Dispense Refill   Blood Glucose Monitoring Suppl (TRUE METRIX METER) DEVI 1 Device by Does not apply route 2 (two) times daily. 1 Device 0   Cholecalciferol 50 MCG (2000 UT) TABS Take 1 tablet (2,000 Units total) by mouth daily. 90 tablet 1   Dulaglutide (TRULICITY) 1.5 FU/9.3AT SOPN Inject 1.5 mg into the skin once a week. 6 mL 3   FARXIGA 10 MG TABS tablet Take 1 tablet (10 mg total) by mouth daily before breakfast. 90 tablet 1   glucose blood (TRUE METRIX BLOOD GLUCOSE TEST) test strip USE AS INSTRUCTED TO  CHECK BLOOD SUGAR ONE TIME DAILY 100 strip 3   Insulin  Glargine (BASAGLAR KWIKPEN) 100 UNIT/ML Inject 16-20 Units into the skin at bedtime. (Patient taking differently: Inject 14 Units into the skin daily before breakfast.) 45 mL 3   Insulin Pen Needle (PEN NEEDLES) 32G X 5 MM MISC 1 Units by Does not apply route daily. 100 each 5   losartan-hydrochlorothiazide (HYZAAR) 50-12.5 MG tablet Take 1 tablet by mouth once daily 90 tablet 0   metFORMIN (GLUCOPHAGE-XR) 500 MG 24 hr tablet TAKE 2 TABLETS BY MOUTH ONCE DAILY WITH SUPPER 180 tablet 0   Multiple Vitamin (MULTIVITAMIN WITH MINERALS) TABS tablet Take 1 tablet by mouth daily.     omeprazole (PRILOSEC) 40 MG capsule TAKE 1 CAPSULE BY MOUTH ONCE DAILY IN THE MORNING AND 1 EVERY DAY AT BEDTIME 90 capsule 1   pravastatin (PRAVACHOL) 40 MG tablet TAKE 1 TABLET AT BEDTIME 90 tablet 0   Tetrahydrozoline HCl (VISINE OP) Apply 1 drop to eye daily as needed (dry eyes).     No current facility-administered medications on file prior to visit.   No Known Allergies Family History  Problem Relation Age of Onset   Heart disease Mother    Hypertension Mother    Cancer Sister    Diabetes Sister    PE: BP 140/78 (BP Location: Left Arm, Patient Position: Sitting, Cuff Size: Normal)   Pulse 93   Ht '5\' 3"'$  (1.6 m)   Wt 191 lb 3.2 oz (86.7 kg)   SpO2 94%   BMI 33.87 kg/m   Wt Readings from Last 3 Encounters:  05/01/22 191 lb 3.2 oz (86.7 kg)  02/04/22 191 lb (86.6 kg)  01/09/22 188 lb 9.6 oz (85.5 kg)   Constitutional: overweight, in NAD Eyes: PERRLA, EOMI, no exophthalmos ENT: moist mucous membranes, no thyromegaly, no thyroid masses palpated, no cervical lymphadenopathy Cardiovascular: RRR, No MRG, +B LE edema Respiratory: CTA B Musculoskeletal: no deformities Skin: moist, warm, no rashes Neurological: no tremor with outstretched hands  ASSESSMENT: 1. DM2-insulin dependent, uncontrolled, with complications - CKD - DR - PN  2.  Thyrotoxic adenomas  3. Multiple thyroid nodules  PLAN:  1.  DM2 -Patient with longstanding type 2 diabetes, on metformin ER, weekly GLP-1 receptor agonist (dose increased at last visit), and long-acting insulin.  At last visit, sugars were at or above target, with values in the 200s after dietary indiscretions.  I recommended to increase her Trulicity dose at that time and continued the same dose of metformin and Basaglar.  HbA1c was slightly higher, at 7.7%. -At today's visit, she tells me that she only increase the Trulicity dose for 2 weeks, and then went back to the lower dose as she did not see an effect.  I explained that the full effect of a change in Trulicity dose is evident only after approximately 3 weeks.   -Her sugars are at goal in the morning, with only 1 exception and she is not checking later in the day.  I advised her to check some sugars later, also. -For now, since HbA1c, despite being improved with still above target (see below), and since she also would like to lose weight, I again suggested to increase the dose to 3 mg weekly.  She agrees to do so.  She will use 1.5 mg x 2 injections at the same time.  When she needs another refill from the patient assistance program, we need to send the higher Trulicity dose. - I advised the  pt to: Patient Instructions  Please continue: - Metformin ER 1000 mg at bedtime. - Basaglar 16 units in am  Increase: - Trulicity 3 mg weekly  (2x 1.5 mg weekly)  Please return in 3-4 months with your sugar log.   - we checked her HbA1c: 7.2% (lower) - advised to check sugars at different times of the day - 1x a day, rotating check times - advised for yearly eye exams  - return to clinic in 3-4 months  2. Patient with a long history of low TSH without thyrotoxic symptoms: She denies weight loss, heat intolerance, hyperdefecation, palpitations, anxiety. -We did check an uptake and scan which showed toxic adenomas in the inferior thyroid lobes -We did not have to intervene in the past since she only had a  mildly suppressed TSH with normal free T4 and free T3.  In 03/2019, TSH was quite low, 0.03 and we discussed about RAI treatment, however, interestingly, TFTs improved afterwards. -Latest TSH was normal: Lab Results  Component Value Date   TSH 0.36 12/31/2021  -No thyrotoxic signs or symptoms -We will recheck a TSH at next visit  3. Multiple thyroid nodules -No neck compression symptoms -The 2 dominant nodules were biopsied with benign results in 2014 -Reviewing the thyroid uptake and scan report from 2020, she had more nodules in the lower poles of the thyroid, but without indication for biopsy -We discussed in the past that RAI treatment could shrink these nodules, in case we decided to go this route, however, we did not have do RAI treatment -we are managing now conservatively.  Philemon Kingdom, MD PhD [facility]

## 2022-06-19 ENCOUNTER — Other Ambulatory Visit: Payer: Self-pay | Admitting: Internal Medicine

## 2022-06-19 DIAGNOSIS — I10 Essential (primary) hypertension: Secondary | ICD-10-CM

## 2022-07-02 DIAGNOSIS — E113593 Type 2 diabetes mellitus with proliferative diabetic retinopathy without macular edema, bilateral: Secondary | ICD-10-CM | POA: Diagnosis not present

## 2022-07-02 DIAGNOSIS — H43811 Vitreous degeneration, right eye: Secondary | ICD-10-CM | POA: Diagnosis not present

## 2022-07-02 DIAGNOSIS — H3582 Retinal ischemia: Secondary | ICD-10-CM | POA: Diagnosis not present

## 2022-07-08 ENCOUNTER — Telehealth: Payer: Self-pay | Admitting: Internal Medicine

## 2022-07-08 ENCOUNTER — Other Ambulatory Visit: Payer: Self-pay | Admitting: Internal Medicine

## 2022-07-08 DIAGNOSIS — E785 Hyperlipidemia, unspecified: Secondary | ICD-10-CM

## 2022-07-08 MED ORDER — PRAVASTATIN SODIUM 40 MG PO TABS: 40.0000 mg | ORAL_TABLET | Freq: Every day | ORAL | 0 refills | Status: DC

## 2022-07-08 NOTE — Telephone Encounter (Signed)
Patient needs her pravastatin refilled but she wants it sent to [facility] on [address].

## 2022-07-22 ENCOUNTER — Telehealth: Payer: Self-pay

## 2022-07-22 DIAGNOSIS — Z596 Low income: Secondary | ICD-10-CM

## 2022-07-22 NOTE — Progress Notes (Signed)
[facility])                                            [place] Team    07/22/2022  Mckala Pantaleon Pennywell [birth year] [id number]                                                                                                            Medication Assistance Referral-2024 re-enrollment  Referral From: Quitman Livings ([place])  Medication/Company: Larna Daughters / Novo Nordisk Patient application portion:  Mailed Provider application portion: Faxed  to Dr. Philemon Kingdom Provider address/fax verified via: Office website  Lelon Huh, [place] [phone]

## 2022-07-24 ENCOUNTER — Other Ambulatory Visit: Payer: Self-pay | Admitting: Internal Medicine

## 2022-07-25 ENCOUNTER — Other Ambulatory Visit: Payer: Self-pay | Admitting: Internal Medicine

## 2022-07-29 DIAGNOSIS — Z1231 Encounter for screening mammogram for malignant neoplasm of breast: Secondary | ICD-10-CM | POA: Diagnosis not present

## 2022-07-29 LAB — HM MAMMOGRAPHY

## 2022-08-19 ENCOUNTER — Telehealth: Payer: Self-pay

## 2022-08-19 DIAGNOSIS — Z596 Low income: Secondary | ICD-10-CM

## 2022-08-19 NOTE — Progress Notes (Signed)
[facility])                                            [place] Team    08/19/2022  Bridget Butler Jan 24, 1946 [id number]   [facility] [facility])                                            [place] Team   Received Provider and Patient portion(s) of patient assistance application(s) for Office manager. Faxed completed application and required documents into [employer].  Lelon Huh, [place] [phone]

## 2022-08-24 ENCOUNTER — Other Ambulatory Visit: Payer: Self-pay | Admitting: Internal Medicine

## 2022-08-24 DIAGNOSIS — K219 Gastro-esophageal reflux disease without esophagitis: Secondary | ICD-10-CM

## 2022-09-01 ENCOUNTER — Ambulatory Visit: Payer: Medicare PPO | Admitting: Internal Medicine

## 2022-09-01 ENCOUNTER — Encounter: Payer: Self-pay | Admitting: Internal Medicine

## 2022-09-01 VITALS — BP 128/60 | HR 75 | Ht 63.0 in | Wt 186.6 lb

## 2022-09-01 DIAGNOSIS — N1831 Chronic kidney disease, stage 3a: Secondary | ICD-10-CM

## 2022-09-01 DIAGNOSIS — E0590 Thyrotoxicosis, unspecified without thyrotoxic crisis or storm: Secondary | ICD-10-CM

## 2022-09-01 DIAGNOSIS — E042 Nontoxic multinodular goiter: Secondary | ICD-10-CM | POA: Diagnosis not present

## 2022-09-01 DIAGNOSIS — E1122 Type 2 diabetes mellitus with diabetic chronic kidney disease: Secondary | ICD-10-CM

## 2022-09-01 LAB — LIPID PANEL
Cholesterol: 173 mg/dL (ref 0–200)
HDL: 58.2 mg/dL (ref >39.00)
LDL Cholesterol: 80 mg/dL (ref 0–99)
NonHDL: 114.55
Total CHOL/HDL Ratio: 3
Triglycerides: 173 mg/dL — ABNORMAL HIGH (ref 0.0–149.0)
VLDL: 34.6 mg/dL (ref 0.0–40.0)

## 2022-09-01 LAB — POCT GLYCOSYLATED HEMOGLOBIN (HGB A1C): Hemoglobin A1C: 7.7 % — AB (ref 4.0–5.6)

## 2022-09-01 LAB — T3, FREE: T3, Free: 3.3 pg/mL (ref 2.3–4.2)

## 2022-09-01 LAB — TSH: TSH: 0.2 u[IU]/mL — ABNORMAL LOW (ref 0.35–5.50)

## 2022-09-01 LAB — T4, FREE: Free T4: 0.9 ng/dL (ref 0.60–1.60)

## 2022-09-01 MED ORDER — REPAGLINIDE 1 MG PO TABS: 1.0000 mg | ORAL_TABLET | Freq: Every day | ORAL | 3 refills | Status: DC

## 2022-09-01 MED ORDER — METHIMAZOLE 5 MG PO TABS: 2.5000 mg | ORAL_TABLET | Freq: Every day | ORAL | 5 refills | Status: DC

## 2022-09-01 NOTE — Progress Notes (Signed)
00. Patient ID: Bridget Butler, female   DOB: Nov 11, 1945, 76 y.o.   MRN: [id number]   HPI  Bridget Butler is a 76 y.o.-year-old female, initially referred by her PCP, Dr. Brigitte Pulse, returning for follow-up for toxic thyroid adenomas with subclinical thyrotoxicosis and now also uncontrolled DM2, insulin-dependent, with complications (CKD, DR, PN).  Last visit 4 months ago.  Interim history: No increased urination, blurry vision, nausea, chest pain. She has blurry vision due to dry eyes.  Toxic thyroid nodules:  Reviewed history: She was diagnosed with hyperthyroidism approximately 2010. She was seeing Dr. Jeanann Lewandowsky - he retired. She was started on medication (cannot remember name) >> felt "off" >> stopped in ~2010.  Reviewed her TFTs: Lab Results  Component Value Date   TSH 0.36 12/31/2021   TSH 0.38 01/14/2021   TSH 0.57 07/18/2020   TSH 0.148 (L) 06/04/2020   TSH 0.03 (L) 04/11/2019   TSH 0.03 (L) 10/29/2018   TSH 0.054 (L) 07/08/2018   TSH 0.118 (L) 03/29/2018   FREET4 0.90 01/14/2021   FREET4 0.66 07/18/2020   FREET4 0.93 06/08/2020   FREET4 0.84 04/11/2019   FREET4 0.97 10/29/2018   FREET4 1.05 07/08/2018   T3FREE 3.0 01/14/2021   T3FREE 3.1 07/18/2020   T3FREE 3.0 04/11/2019   T3FREE 3.8 10/29/2018   T3FREE 3.3 07/08/2018   Her TSI antibodies were not elevated: Lab Results  Component Value Date   TSI <89 10/29/2018    Thyroid uptake (08/28/2007):  There is heterogeneous uptake within a normal sized gland. There are regions of photopenia within the bilateral upper poles and in the left lower pole. The most intense uptake is in the mid pole of the right lobe of the thyroid gland. Decreased uptake in the inferior pole of the right lobe of the thyroid gland additionally.   Calculated 24-hour I-131 uptake is equal to 25.0%, which is normal (normal 10-30%).   IMPRESSION:   1. Heterogeneous uptake within the thyroid gland could represent resolving subacute thyroiditis in  patient with a normal uptake.  2. Cannot exclude a multinodular goiter if TSH continues to remain depressed  We obtained a thyroid uptake and scan (11/30/2018): Normal uptake but multinodular goiter with warm nodules at inferior thyroid poles:  4 hour I-123 uptake = 8.6% (normal 5-20%) 24 hour I-123 uptake = 25.3% (normal 10-30%)   IMPRESSION: Normal 4 hour and 24 hour radio iodine uptakes.   Multinodular thyroid gland with warm nodules at the inferior poles of both thyroid lobes, corresponding to dominant masses identified on a remote thyroid ultrasound consistent with adenomas.  No FH of thyroid disease or thyroid cancer. No h/o radiation tx to head or neck. No herbal supplements. No Biotin use. No recent steroids use.   Multinodular goiter per ultrasound from 06/28/2013.   Biopsies were benign on 07/13/2013.  Pt denies: - feeling nodules in neck - hoarseness - dysphagia - choking  DM2: -Diagnosed: 9092 - insulin started: ~2006  Reviewed history: Her diabetes became uncontrolled after her first laryngeal surgery in 03/2020.  She was on steroids at the time of her surgery.  Reviewed HbA1c levels: Lab Results  Component Value Date   HGBA1C 7.2 (A) 05/01/2022   HGBA1C 7.7 (H) 12/31/2021   HGBA1C 7.5 (A) 09/09/2021   HGBA1C 7.7 (H) 05/01/2021   HGBA1C 7.5 (A) 01/14/2021   HGBA1C 7.7 (A) 10/15/2020   HGBA1C 9.1 (A) 08/23/2020   HGBA1C 10.4 (H) 07/11/2020   HGBA1C 7.0 (H) 03/29/2020   HGBA1C  7.1 (H) 12/05/2019   She is on: - Janumet 50-1000 mg at bedtime >> Metformin ER 1000 mg at bedtime - Trulicity 6.27 >> 1.5 >> 3 x1 mo  -gets these through patient assistance  - Basaglar 16 >> 14 >> 16 >> 14-16 >> 16 >> 18 units in a.m.  She had GI intolerance with regular Metformin. Coral Ceo >> $$$.  Pt checks her sugars once a day: - am: 98-148 >> 96-132, 158 (late dinner) >> 90s-109, 200 >> 98, 112-161, 183, 202 - 2h after b'fast: n/c - before lunch: n/c >> 154 >> n/c -  2h after lunch: n/c >> 178 >> 154, 159 >> 83-216, 229 >> n/c >> 154 - before dinner: n/c >> 211>> n/c - 2h after dinner: n/c - bedtime: n/c >> 152 >> 252 >> 133-229, 237 >> n/c >> 128-282, 305 - nighttime: n/c Lowest sugar was 63 >> 69 >> 96 >> 90 >> 98; she has hypoglycemia awareness at 70.  Highest sugar was 400s >> .[name] KitchenMarland Kitchen252 (no meds) >> 237 >> 200 (ate late) >> 305.  Glucometer: True Metrix  Pt's meals are: - Breakfast: eggs, [place] sausage or oatmeal >> coffee  - Lunch: salad + eggs + [place] meat (no pork) >> sandwich - Dinner: veggies + chicken/turkey and fish - Snacks: not after sx  -+ Mild CKD, last BUN/creatinine:  Lab Results  Component Value Date   BUN 19 12/31/2021   BUN 18 05/01/2021   CREATININE 0.98 12/31/2021   CREATININE 1.04 05/01/2021  On losartan 50  -+ HL; last set of lipids: Lab Results  Component Value Date   CHOL 178 05/01/2021   HDL 63.50 05/01/2021   LDLCALC 91 05/01/2021   TRIG 118.0 05/01/2021   CHOLHDL 3 05/01/2021  On pravastatin 40.  - last eye exam was on 2023: + DR, stable reportedly. Sees retina specialist. Dr. Sherlynn Stalls.  -+ Improved numbness and tingling in her feet.  Last foot exam 12/2021.  On ASA 81  Pt has FH of DM in mother.  She also has a history of GERD, osteopenia, microlaryngoscopy for subglottic stenosis 06 and 06/2020.  ROS:  + see HPI  I reviewed pt's medications, allergies, PMH, social hx, family hx, and changes were documented in the history of present illness. Otherwise, unchanged from my initial visit note.  Past Medical History:  Diagnosis Date   Cataract    Diabetes mellitus without complication (HCC)    Dyspnea    on exertion   GERD (gastroesophageal reflux disease)    Hyperlipidemia    Hypertension    Multiple thyroid nodules    Thyroid disease    Past Surgical History:  Procedure Laterality Date   CHOLECYSTECTOMY     COSMETIC SURGERY Bilateral    eyelids   EYE SURGERY     GALLBLADDER SURGERY      MICROLARYNGOSCOPY WITH CO2 LASER AND EXCISION OF VOCAL CORD LESION N/A 04/02/2020   Procedure: MICROLARYNGOSCOPY WITH DILATION AND CO2 LASER AND EXCISION OF VOCAL CORD LESION w/MITOMYCIN C;  Surgeon: Melida Quitter, MD;  Location: [place];  Service: ENT;  Laterality: N/A;   MICROLARYNGOSCOPY WITH DILATION N/A 05/23/2020   Procedure: MICROLARYNGOSCOPY W/ DILATION AND JET VENTILATION;  Surgeon: Melida Quitter, MD;  Location: [place];  Service: ENT;  Laterality: N/A;   MICROLARYNGOSCOPY WITH DILATION N/A 07/13/2020   Procedure: MICROLARYNGOSCOPY with Jet Ventilation;  Surgeon: Melida Quitter, MD;  Location: [place];  Service: ENT;  Laterality: N/A;   [place]  N/A 04/02/2020   Procedure: Awake TRACHEOSTOMY;  Surgeon: Melida Quitter, MD;  Location: [place];  Service: ENT;  Laterality: N/A;   TUBAL LIGATION     Social History   Socioeconomic History   Marital status: Widowed    Spouse name: Not on file   Number of children: 3   Years of education: Not on file   Highest education level: Not on file  Occupational History   Not on file  Tobacco Use   Smoking status: Never   Smokeless tobacco: Never  Vaping Use   Vaping Use: Never used  Substance and Sexual Activity   Alcohol use: No   Drug use: No   Sexual activity: Not Currently    Partners: Male  Other Topics Concern   Not on file  Social History Narrative   Mrs Rizzolo is a 76 year old Jehovah witness patient    She receives support from her daughter primarily who also assists with transportation to medical appointments.    She is presently independent with her ADLs and iADLs      Social Determinants of Health   Financial Resource Strain: Low Risk  (09/11/2021)   Overall Financial Resource Strain (CARDIA)    Difficulty of Paying Living Expenses: Not hard at all  Food Insecurity: No Food Insecurity (09/11/2021)   Hunger Vital Sign    Worried About Running Out of Food in the Last Year: Never true    Ran Out of Food in the  Last Year: Never true  Transportation Needs: No Transportation Needs (09/11/2021)   PRAPARE - Hydrologist (Medical): No    Lack of Transportation (Non-Medical): No  Physical Activity: Sufficiently Active (09/11/2021)   Exercise Vital Sign    Days of Exercise per Week: 7 days    Minutes of Exercise per Session: 30 min  Stress: No Stress Concern Present (09/11/2021)   [place]    Feeling of Stress : Not at all  Social Connections: Moderately Integrated (09/11/2021)   Social Connection and Isolation Panel [NHANES]    Frequency of Communication with Friends and Family: More than three times a week    Frequency of Social Gatherings with Friends and Family: More than three times a week    Attends Religious Services: More than 4 times per year    Active Member of [employer] or Organizations: Yes    Attends Archivist Meetings: More than 4 times per year    Marital Status: Widowed  Intimate Partner Violence: Not At Risk (09/11/2021)   Humiliation, Afraid, Rape, and Kick questionnaire    Fear of Current or Ex-Partner: No    Emotionally Abused: No    Physically Abused: No    Sexually Abused: No   Current Outpatient Medications on File Prior to Visit  Medication Sig Dispense Refill   Blood Glucose Monitoring Suppl (TRUE METRIX METER) DEVI 1 Device by Does not apply route 2 (two) times daily. 1 Device 0   Cholecalciferol 50 MCG (2000 UT) TABS Take 1 tablet (2,000 Units total) by mouth daily. 90 tablet 1   Dulaglutide (TRULICITY) 3 GU/4.4IH SOPN Inject 3 mg into the skin once a week. 6 mL 3   glucose blood (TRUE METRIX BLOOD GLUCOSE TEST) test strip USE AS INSTRUCTED TO CHECK BLOOD SUGAR ONE TIME DAILY 100 strip 3   Insulin Glargine (BASAGLAR KWIKPEN) 100 UNIT/ML Inject 16-20 Units into the skin at bedtime. (Patient taking differently:  Inject 14 Units into the skin daily before breakfast.) 45 mL  3   Insulin Pen Needle (PEN NEEDLES) 32G X 5 MM MISC 1 Units by Does not apply route daily. 100 each 5   losartan-hydrochlorothiazide (HYZAAR) 50-12.5 MG tablet Take 1 tablet by mouth once daily 90 tablet 0   metFORMIN (GLUCOPHAGE-XR) 500 MG 24 hr tablet TAKE 2 TABLETS BY MOUTH ONCE DAILY WITH SUPPER 180 tablet 0   Multiple Vitamin (MULTIVITAMIN WITH MINERALS) TABS tablet Take 1 tablet by mouth daily.     omeprazole (PRILOSEC) 40 MG capsule TAKE 1 CAPSULE BY MOUTH TWICE DAILY --  IN  THE  MORNING  AND  AT  BEDTIME 180 capsule 0   pravastatin (PRAVACHOL) 40 MG tablet Take 1 tablet (40 mg total) by mouth at bedtime. 90 tablet 0   Tetrahydrozoline HCl (VISINE OP) Apply 1 drop to eye daily as needed (dry eyes).     No current facility-administered medications on file prior to visit.   No Known Allergies Family History  Problem Relation Age of Onset   Heart disease Mother    Hypertension Mother    Cancer Sister    Diabetes Sister    PE: BP 128/60 (BP Location: Right Arm, Patient Position: Sitting, Cuff Size: Normal)   Pulse 75   Ht '5\' 3"'$  (1.6 m)   Wt 186 lb 9.6 oz (84.6 kg)   SpO2 99%   BMI 33.05 kg/m   Wt Readings from Last 3 Encounters:  09/01/22 186 lb 9.6 oz (84.6 kg)  05/01/22 191 lb 3.2 oz (86.7 kg)  02/04/22 191 lb (86.6 kg)   Constitutional: overweight, in NAD Eyes: EOMI, no exophthalmos ENT: no thyromegaly, no thyroid masses palpated, no cervical lymphadenopathy Cardiovascular: RRR, No MRG, +B LE edema Respiratory: CTA B Musculoskeletal: no deformities Skin: no rashes Neurological: no tremor with outstretched hands  ASSESSMENT: 1. DM2-insulin dependent, uncontrolled, with complications - CKD - DR - PN  2.  Thyrotoxic adenomas  3. Multiple thyroid nodules  PLAN:  1. DM2 -Patient with longstanding diabetes, on metformin ER, weekly GLP-1 receptor agonist and long-acting insulin.  At last visit, HbA1c was better, at 7.2%, decreased from 7.7%.  At that time she  just increase Trulicity 2 weeks prior to the appointment and sugars were at goal in the morning and she was not checking later in the day.  I advised her to check sugars at different times of the day.  Since HbA1c was improved, we did not change her regimen. -At this visit, was at higher.  She is not quite sure why, but possibly related to her later dinners.  She increased her Basaglar by 2 units to hopefully help with the higher blood sugars.  Sugars in the morning are closer to goal, but at night, at bedtime, sugars can go up to upper 200s and she even had 1 value at 305.  At today's visit, we discussed about possibly adding mealtime insulin before dinner but we decided to try meglitinide first.  I advised her to try to add Prandin before regular meals.  She can skip it if she has a small meal for increase it to 2 tablets for a larger meal. - I advised the pt to: Patient Instructions  Please continue: - Metformin ER 1000 mg at bedtime. - Basaglar 18 units in am - Trulicity 3 mg weekly   Please start: - Prandin 1-2 mg daily before dinner     Please stop at the lab.  Please  return in 3-4 months with your sugar log.   - we checked her HbA1c: 7.7% (higher) - advised to check sugars at different times of the day - 1x a day, rotating check times - advised for yearly eye exams >> she is UTD - return to clinic in 4 months  2. Patient with a long history of low TSH without thyrotoxic symptoms: She denies weight loss, heat intolerance, hyperdefecation, palpitations, anxiety. -We did check an uptake and scan which showed toxic adenomas in the inferior thyroid lobes -We did not have to intervene in the past since she only had a mildly suppressed TSH with normal free T4 and free T3.  In 03/2019, TSH was quite low, 0.03 and we discussed about RAI treatment, however, interestingly, TFTs improved afterwards. -Latest TSH was normal: Lab Results  Component Value Date   TSH 0.36 12/31/2021  -No thyrotoxic  signs -We will recheck a TSH now  3. Multiple thyroid nodules -No neck compression symptoms -The 2 dominant nodules were biopsied with benign results in 2014 -Reviewing the thyroid uptake and scan report from 2020, she had more nodules in the lower poles of the thyroid but without indication for biopsy -We discussed in the past that RAI treatment to shrink these nodules, in case we decided to go this route, however, she did not have RAI treatment yet -We are managing the conservatively   Component     Latest Ref Rng 09/01/2022  Triglycerides     0.0 - 149.0 mg/dL 173.0 (H)   HDL Cholesterol     >39.00 mg/dL 58.20   LDL (calc)     0 - 99 mg/dL 80   Total CHOL/HDL Ratio 3   TSH     0.35 - 5.50 uIU/mL 0.20 (L)   Triiodothyronine,Free,Serum     2.3 - 4.2 pg/mL 3.3   T4,Free(Direct)     0.60 - 1.60 ng/dL 0.90   Cholesterol     0 - 200 mg/dL 173   VLDL     0.0 - 40.0 mg/dL 34.6   NonHDL 114.55    TSH is now slightly low.  Free thyroid hormones are normal.  I would suggest a low-dose of methimazole, 5 mg every other day (or 2.5 mg daily) and I will recheck her TFTs in 1 month. LDL is slightly above target.  Will check with her if she is taking the pravastatin daily.  Philemon Kingdom, MD PhD [facility]

## 2022-09-01 NOTE — Patient Instructions (Addendum)
Please continue: - Metformin ER 1000 mg at bedtime. - Basaglar 18 units in am - Trulicity 3 mg weekly   Please start: - Prandin 1-2 mg daily before dinner     Please stop at the lab.  Please return in 3-4 months with your sugar log.

## 2022-09-02 ENCOUNTER — Telehealth: Payer: Self-pay | Admitting: Internal Medicine

## 2022-09-02 NOTE — Telephone Encounter (Signed)
LVM for pt to rtn my call to schedule AWV with [facility] call back # [phone] 

## 2022-09-10 NOTE — Patient Instructions (Signed)

## 2022-09-10 NOTE — Progress Notes (Signed)
Subjective:   Bridget Butler is a 76 y.o. female who presents for Medicare Annual (Subsequent) preventive examination. I connected with  Gerlean Ren Cisse on 09/15/22 by a audio enabled telemedicine application and verified that I am speaking with the correct person using two identifiers.  Patient Location: Home  Provider Location: Home Office  I discussed the limitations of evaluation and management by telemedicine. The patient expressed understanding and agreed to proceed.  Review of Systems    Deferred to  PCP Cardiac Risk Factors include: advanced age (>44mn, >>1women);diabetes mellitus;dyslipidemia;hypertension;obesity (BMI >30kg/m2)     Objective:    There were no vitals filed for this visit. There is no height or weight on file to calculate BMI.     09/15/2022    9:31 AM 09/11/2021    1:00 PM 03/09/2021    6:15 PM 07/11/2020    1:41 PM 05/16/2020    2:19 PM 03/29/2020    1:29 PM 01/02/2020    1:11 PM  Advanced Directives  Does Patient Have a Medical Advance Directive? Yes Yes No No Yes Yes No  Type of AParamedicof ACardiffLiving will    [place] will [place]  Does patient want to make changes to medical advance directive? No - Patient declined No - Patient declined    No - Patient declined Yes (ED - Information included in AVS)  Copy of [place] Chart? Yes - validated most recent copy scanned in chart (See row information)    Yes - validated most recent copy scanned in chart (See row information) Yes - validated most recent copy scanned in chart (See row information)   Would patient like information on creating a medical advance directive?   No - Patient declined Yes (MAU/Ambulatory/Procedural Areas - Information given)   Yes (ED - Information included in AVS)    Current Medications (verified) Outpatient Encounter Medications as of 09/15/2022  Medication Sig   Blood Glucose  Monitoring Suppl (TRUE METRIX METER) DEVI 1 Device by Does not apply route 2 (two) times daily.   Cholecalciferol 50 MCG (2000 UT) TABS Take 1 tablet (2,000 Units total) by mouth daily.   Dulaglutide (TRULICITY) 3 MNG/2.9BMSOPN Inject 3 mg into the skin once a week.   glucose blood (TRUE METRIX BLOOD GLUCOSE TEST) test strip USE AS INSTRUCTED TO CHECK BLOOD SUGAR ONE TIME DAILY   Insulin Glargine (BASAGLAR KWIKPEN) 100 UNIT/ML Inject 16-20 Units into the skin at bedtime. (Patient taking differently: Inject 16 Units into the skin daily before breakfast.)   Insulin Pen Needle (PEN NEEDLES) 32G X 5 MM MISC 1 Units by Does not apply route daily.   losartan-hydrochlorothiazide (HYZAAR) 50-12.5 MG tablet Take 1 tablet by mouth once daily   metFORMIN (GLUCOPHAGE-XR) 500 MG 24 hr tablet TAKE 2 TABLETS BY MOUTH ONCE DAILY WITH SUPPER   methimazole (TAPAZOLE) 5 MG tablet Take 0.5 tablets (2.5 mg total) by mouth daily.   Multiple Vitamin (MULTIVITAMIN WITH MINERALS) TABS tablet Take 1 tablet by mouth daily.   omeprazole (PRILOSEC) 40 MG capsule TAKE 1 CAPSULE BY MOUTH TWICE DAILY --  IN  THE  MORNING  AND  AT  BEDTIME   pravastatin (PRAVACHOL) 40 MG tablet Take 1 tablet (40 mg total) by mouth at bedtime.   repaglinide (PRANDIN) 1 MG tablet Take 1 tablet (1 mg total) by mouth daily before supper.   Tetrahydrozoline HCl (VISINE OP) Apply 1 drop  to eye daily as needed (dry eyes).   [DISCONTINUED] losartan-hydrochlorothiazide (HYZAAR) 50-12.5 MG tablet Take 1 tablet by mouth once daily   No facility-administered encounter medications on file as of 09/15/2022.    Allergies (verified) Patient has no known allergies.   History: Past Medical History:  Diagnosis Date   Cataract    Diabetes mellitus without complication (HCC)    Dyspnea    on exertion   GERD (gastroesophageal reflux disease)    Hyperlipidemia    Hypertension    Multiple thyroid nodules    Thyroid disease    Past Surgical History:   Procedure Laterality Date   CHOLECYSTECTOMY     COSMETIC SURGERY Bilateral    eyelids   EYE SURGERY     GALLBLADDER SURGERY     MICROLARYNGOSCOPY WITH CO2 LASER AND EXCISION OF VOCAL CORD LESION N/A 04/02/2020   Procedure: MICROLARYNGOSCOPY WITH DILATION AND CO2 LASER AND EXCISION OF VOCAL CORD LESION w/MITOMYCIN C;  Surgeon: Melida Quitter, MD;  Location: [place];  Service: ENT;  Laterality: N/A;   MICROLARYNGOSCOPY WITH DILATION N/A 05/23/2020   Procedure: MICROLARYNGOSCOPY W/ DILATION AND JET VENTILATION;  Surgeon: Melida Quitter, MD;  Location: [place];  Service: ENT;  Laterality: N/A;   MICROLARYNGOSCOPY WITH DILATION N/A 07/13/2020   Procedure: MICROLARYNGOSCOPY with Jet Ventilation;  Surgeon: Melida Quitter, MD;  Location: [place];  Service: ENT;  Laterality: N/A;   TRACHEOSTOMY TUBE PLACEMENT N/A 04/02/2020   Procedure: Awake TRACHEOSTOMY;  Surgeon: Melida Quitter, MD;  Location: [facility] OR;  Service: ENT;  Laterality: N/A;   TUBAL LIGATION     Family History  Problem Relation Age of Onset   Heart disease Mother    Hypertension Mother    Cancer Sister    Diabetes Sister    Social History   Socioeconomic History   Marital status: Widowed    Spouse name: Not on file   Number of children: 3   Years of education: 7   Highest education level: Not on file  Occupational History   Not on file  Tobacco Use   Smoking status: Never   Smokeless tobacco: Never  Vaping Use   Vaping Use: Never used  Substance and Sexual Activity   Alcohol use: No   Drug use: No   Sexual activity: Not Currently    Partners: Male  Other Topics Concern   Not on file  Social History Narrative   Mrs Hunzeker is a 76 year old Jehovah witness patient    She receives support from her daughter primarily who also assists with transportation to medical appointments.    She is presently independent with her ADLs and iADLs      Social Determinants of Health   Financial Resource Strain: Low Risk  (09/11/2021)   Overall  Financial Resource Strain (CARDIA)    Difficulty of Paying Living Expenses: Not hard at all  Food Insecurity: No Food Insecurity (09/15/2022)   Hunger Vital Sign    Worried About Running Out of Food in the Last Year: Never true    Ran Out of Food in the Last Year: Never true  Transportation Needs: No Transportation Needs (09/15/2022)   PRAPARE - Hydrologist (Medical): No    Lack of Transportation (Non-Medical): No  Physical Activity: Sufficiently Active (09/15/2022)   Exercise Vital Sign    Days of Exercise per Week: 7 days    Minutes of Exercise per Session: 30 min  Stress: No Stress Concern Present (09/15/2022)  [employer] Occupational [employer] - Occupational Stress Questionnaire    Feeling of Stress : Not at all  Social Connections: Moderately Integrated (09/15/2022)   Social Connection and Isolation Panel [NHANES]    Frequency of Communication with Friends and Family: More than three times a week    Frequency of Social Gatherings with Friends and Family: More than three times a week    Attends Religious Services: More than 4 times per year    Active Member of [employer] or Organizations: Yes    Attends Archivist Meetings: More than 4 times per year    Marital Status: Widowed    Tobacco Counseling Counseling given: Not Answered   Clinical Intake:  Pre-visit preparation completed: Yes  Pain : No/denies pain     Nutritional Status: BMI > 30  Obese Nutritional Risks: None Diabetes: Yes CBG done?: No (phone visit) Did pt. bring in CBG monitor from home?: No (phone visit)  How often do you need to have someone help you when you read instructions, pamphlets, or other written materials from your doctor or pharmacy?: 1 - Never What is the last grade level you completed in school?: 12th  Diabetic?Yes Nutrition Risk Assessment:  Has the patient had any N/V/D within the last 2 months?  No  Does the patient have any non-healing  wounds?  No  Has the patient had any unintentional weight loss or weight gain?  No   Diabetes:  Is the patient diabetic?  Yes  If diabetic, was a CBG obtained today?  No , phone visit Did the patient bring in their glucometer from home?  No , phone visit How often do you monitor your CBG's? daily.   Financial Strains and Diabetes Management:  Are you having any financial strains with the device, your supplies or your medication? No .  Does the patient want to be seen by Chronic Care Management for management of their diabetes?  No  Would the patient like to be referred to a Nutritionist or for Diabetic Management?  No   Diabetic Exams:  Diabetic Eye Exam: Overdue for diabetic eye exam. Pt has been advised about the importance in completing this exam. Patient advised to call and schedule an eye exam. Diabetic Foot Exam: Completed 01/09/22   Interpreter Needed?: No  Information entered by :: Emelia Loron RN   Activities of Daily Living    09/15/2022    9:28 AM  In your present state of health, do you have any difficulty performing the following activities:  Hearing? 0  Vision? 0  Difficulty concentrating or making decisions? 0  Walking or climbing stairs? 0  Dressing or bathing? 0  Doing errands, shopping? 0  Preparing Food and eating ? N  Using the Toilet? N  In the past six months, have you accidently leaked urine? N  Do you have problems with loss of bowel control? N  Managing your Medications? N  Managing your Finances? N  Housekeeping or managing your Housekeeping? N    Patient Care Team: Janith Lima, MD as PCP - General (Internal Medicine) Sherlynn Stalls, MD as Consulting Physician (Ophthalmology) Philemon Kingdom, MD as Consulting Physician (Internal Medicine)  Indicate any recent Medical Services you may have received from other than [facility] providers in the past year (date may be approximate).     Assessment:   This is a routine wellness examination for  Bridget Butler.  Hearing/Vision screen No results found.  Dietary issues and exercise activities discussed: Current Exercise Habits:  Home exercise routine, Type of exercise: walking, Time (Minutes): 30, Frequency (Times/Week): 7, Weekly Exercise (Minutes/Week): 210, Intensity: Mild, Exercise limited by: None identified   Goals Addressed             This Visit's Progress    Patient Stated       I want to lose weight by decreasing my snacking.       Depression Screen    09/15/2022    9:28 AM 09/11/2021   12:59 PM 08/23/2020    2:03 PM 04/25/2020    2:21 PM 01/02/2020    1:14 PM 12/05/2019   11:45 AM 06/08/2019    1:58 PM  PHQ 2/9 Scores  PHQ - 2 Score 0 0 0 0 0 0 0    Fall Risk    09/15/2022    9:31 AM 09/11/2021    1:00 PM 08/23/2020    2:03 PM 06/04/2020    2:41 PM 04/25/2020    2:21 PM  [place] in the past year? 0 0 0 0 0  Number falls in past yr: 0 0 0 0 0  Injury with Fall? 0 0 0 0 0  Risk for fall due to : No Fall Risks No Fall Risks     Follow up Falls evaluation completed Falls evaluation completed Falls evaluation completed  Falls evaluation completed    [place]:  Any stairs in or around the home? Yes  If so, are there any without handrails? Yes  Home free of loose throw rugs in walkways, pet beds, electrical cords, etc? Yes  Adequate lighting in your home to reduce risk of falls? Yes   ASSISTIVE DEVICES UTILIZED TO PREVENT FALLS:  Life alert? No  Use of a cane, walker or w/c? No  Grab bars in the bathroom? Yes  Shower chair or bench in shower? No  Elevated toilet seat or a handicapped toilet? No   Cognitive Function:        09/15/2022    9:38 AM 01/02/2020    1:12 PM  6CIT Screen  What Year? 0 points 0 points  What month? 0 points 0 points  What time? 0 points 0 points  Count back from 20 0 points 0 points  Months in reverse 2 points 2 points  Repeat phrase 0 points 0 points  Total Score 2 points 2 points     Immunizations Immunization History  Administered Date(s) Administered   Fluad Quad(high Dose 65+) 08/23/2020   Influenza, High Dose Seasonal PF 07/27/2017, 07/08/2018, 06/19/2019   Influenza-Unspecified 08/13/2021   PFIZER(Purple Top)SARS-COV-2 Vaccination 11/08/2019, 11/29/2019   Pneumococcal Conjugate-13 09/02/2019   Pneumococcal Polysaccharide-23 05/01/2021    Flu Vaccine status: Up to date Patient states she is up to date; received vaccine at [facility]  Pneumococcal vaccine status: Up to date  Covid-19 vaccine status: Information provided on how to obtain vaccines.  Patient states she is up to date; received vaccine [facility]  Qualifies for Shingles Vaccine? Yes   Zostavax completed No   Shingrix Completed?: No.    Education has been provided regarding the importance of this vaccine. Patient has been advised to call insurance company to determine out of pocket expense if they have not yet received this vaccine. Advised may also receive vaccine at local pharmacy or Health Dept. Verbalized acceptance and understanding.   Patient states she is up to date; received vaccine at [place] Maintenance  Topic Date Due  Zoster Vaccines- Shingrix (1 of 2) Never done   OPHTHALMOLOGY EXAM  12/25/2020   Diabetic kidney evaluation - Urine ACR  05/01/2022   COVID-19 Vaccine (3 - 2023-24 season) 06/20/2022   INFLUENZA VACCINE  01/18/2023 (Originally 05/20/2022)   Diabetic kidney evaluation - GFR measurement  01/01/2023   FOOT EXAM  01/10/2023   HEMOGLOBIN A1C  03/02/2023   Medicare Annual Wellness (AWV)  09/16/2023   Pneumonia Vaccine 31+ Years old  Completed   DEXA SCAN  Completed   Hepatitis C Screening  Completed   HPV VACCINES  Aged Out   COLONOSCOPY (Pts 45-26yr Insurance coverage will need to be confirmed)  Discontinued    Health Maintenance  Health Maintenance Due  Topic Date Due   Zoster Vaccines- Shingrix (1 of 2) Never done   OPHTHALMOLOGY EXAM   12/25/2020   Diabetic kidney evaluation - Urine ACR  05/01/2022   COVID-19 Vaccine (3 - 2023-24 season) 06/20/2022    Colorectal cancer screening: Type of screening: Colonoscopy. Completed 10/21/15. Repeat every never years  Mammogram status: Completed 07/03/21. Repeat every year  Bone Density status: [facility] Pt provided with contact info and advised to call to schedule appt.  Lung Cancer Screening: (Low Dose CT Chest recommended if Age 206-80years, 30 pack-year currently smoking OR have quit w/in 15years.) does not qualify.   Additional Screening:  Hepatitis C Screening: does qualify; Completed 03/29/18  Vision Screening: Recommended annual ophthalmology exams for early detection of glaucoma and other disorders of the eye. Is the patient up to date with their annual eye exam?  Yes  Who is the provider or what is the name of the office in which the patient attends annual eye exams? Dr. SBaird CancerIf pt is not established with a provider, would they like to be referred to a provider to establish care?  N/A .   Dental Screening: Recommended annual dental exams for proper oral hygiene  Community Resource Referral / Chronic Care Management: CRR required this visit?  No   CCM required this visit?  No      Plan:     I have personally reviewed and noted the following in the patient's chart:   Medical and social history Use of alcohol, tobacco or illicit drugs  Current medications and supplements including opioid prescriptions. Patient is not currently taking opioid prescriptions. Functional ability and status Nutritional status Physical activity Advanced directives List of other physicians Hospitalizations, surgeries, and ER visits in previous 12 months Vitals Screenings to include cognitive, depression, and falls Referrals and appointments  In addition, I have reviewed and discussed with patient certain preventive protocols, quality metrics, and best  practice recommendations. A written personalized care plan for preventive services as well as general preventive health recommendations were provided to patient.     JMichiel Cowboy RN   09/15/2022   Nurse Notes:  Ms. WLeonides Schanz, Thank you for taking time to come for your Medicare Wellness Visit. I appreciate your ongoing commitment to your health goals. Please review the following plan we discussed and let me know if I can assist you in the future.   These are the goals we discussed:  Goals       Patient Stated     Patient Stated (pt-stated)      My goal is to get my HgA1c down to normal range and lose about 15 pounds.      Other     Patient Stated  I want to lose weight by decreasing my snacking.       Weight (lb) < 200 lb (90.7 kg)        This is a list of the screening recommended for you and due dates:  Health Maintenance  Topic Date Due   Zoster (Shingles) Vaccine (1 of 2) Never done   Eye exam for diabetics  12/25/2020   Yearly kidney health urinalysis for diabetes  05/01/2022   COVID-19 Vaccine (3 - 2023-24 season) 06/20/2022   Flu Shot  01/18/2023*   Yearly kidney function blood test for diabetes  01/01/2023   Complete foot exam   01/10/2023   Hemoglobin A1C  03/02/2023   Medicare Annual Wellness Visit  09/16/2023   Pneumonia Vaccine  Completed   DEXA scan (bone density measurement)  Completed   Hepatitis C Screening: USPSTF Recommendation to screen - Ages 89-79 yo.  Completed   HPV Vaccine  Aged Out   Colon Cancer Screening  Discontinued  *Topic was postponed. The date shown is not the original due date.

## 2022-09-14 ENCOUNTER — Other Ambulatory Visit: Payer: Self-pay | Admitting: Internal Medicine

## 2022-09-14 DIAGNOSIS — I10 Essential (primary) hypertension: Secondary | ICD-10-CM

## 2022-09-15 ENCOUNTER — Ambulatory Visit (INDEPENDENT_AMBULATORY_CARE_PROVIDER_SITE_OTHER): Payer: Medicare PPO | Admitting: *Deleted

## 2022-09-15 DIAGNOSIS — E2839 Other primary ovarian failure: Secondary | ICD-10-CM

## 2022-09-15 DIAGNOSIS — Z0000 Encounter for general adult medical examination without abnormal findings: Secondary | ICD-10-CM

## 2022-09-16 ENCOUNTER — Telehealth: Payer: Self-pay

## 2022-09-16 DIAGNOSIS — Z596 Low income: Secondary | ICD-10-CM

## 2022-09-16 NOTE — Progress Notes (Signed)
[facility])                                            [place] Team    09/16/2022  Bridget Butler 27-Jun-1946 [id number]    Care coordination call placed to [employer] in regards to [employer] and [employer] application.   Spoke to [place] who informs patient is APPROVED 10/20/2022-10/20/23. A 120 day supply of medication will be delivered to patient's home. Patient is aware of her approval.

## 2022-10-09 ENCOUNTER — Other Ambulatory Visit: Payer: Self-pay | Admitting: Internal Medicine

## 2022-10-19 ENCOUNTER — Other Ambulatory Visit: Payer: Self-pay | Admitting: Internal Medicine

## 2022-10-19 DIAGNOSIS — E785 Hyperlipidemia, unspecified: Secondary | ICD-10-CM

## 2022-10-22 ENCOUNTER — Telehealth: Payer: Self-pay | Admitting: Internal Medicine

## 2022-10-22 ENCOUNTER — Other Ambulatory Visit: Payer: Self-pay | Admitting: Internal Medicine

## 2022-10-22 DIAGNOSIS — E785 Hyperlipidemia, unspecified: Secondary | ICD-10-CM

## 2022-10-22 MED ORDER — PRAVASTATIN SODIUM 40 MG PO TABS: 40.0000 mg | ORAL_TABLET | Freq: Every day | ORAL | 0 refills | Status: DC

## 2022-10-22 NOTE — Telephone Encounter (Signed)
Patient called requesting a refill on her medication pravastatin (PRAVACHOL) 40 MG tablet to be called into her pharmacy on file  [facility] ([place]), [place] - [place]  . Patient would like a callback at [phone].

## 2022-12-23 ENCOUNTER — Other Ambulatory Visit: Payer: Self-pay | Admitting: Internal Medicine

## 2022-12-23 DIAGNOSIS — I10 Essential (primary) hypertension: Secondary | ICD-10-CM

## 2022-12-26 ENCOUNTER — Telehealth: Payer: Self-pay | Admitting: Internal Medicine

## 2022-12-26 NOTE — Telephone Encounter (Signed)
Prescription Request  12/26/2022  LOV: 02/04/2022  What is the name of the medication or equipment? losartan-hydrochlorothiazide (HYZAAR) 50-12.5 MG tablet   Have you contacted your pharmacy to request a refill? No   Which pharmacy would you like this sent to?  [facility] ([address]), [place] - [place] [id number] [place] [address] Phone: [phone] Fax: [phone]  [facility] [place], [place] - [address] [place] [address] Phone: [phone] Fax: [phone]  [place], [place] [address] [place] [address] Phone: [phone] Fax: [phone]    Patient notified that their request is being sent to the clinical staff for review and that they should receive a response within 2 business days.   Please advise at Mobile [phone] (mobile)

## 2022-12-30 ENCOUNTER — Ambulatory Visit (INDEPENDENT_AMBULATORY_CARE_PROVIDER_SITE_OTHER): Payer: Medicare PPO

## 2022-12-30 ENCOUNTER — Encounter: Payer: Self-pay | Admitting: Internal Medicine

## 2022-12-30 ENCOUNTER — Ambulatory Visit (INDEPENDENT_AMBULATORY_CARE_PROVIDER_SITE_OTHER): Payer: Medicare PPO | Admitting: Internal Medicine

## 2022-12-30 VITALS — BP 142/70 | HR 92 | Temp 98.3°F | Resp 16 | Ht 63.0 in | Wt 186.0 lb

## 2022-12-30 DIAGNOSIS — Z794 Long term (current) use of insulin: Secondary | ICD-10-CM

## 2022-12-30 DIAGNOSIS — E876 Hypokalemia: Secondary | ICD-10-CM

## 2022-12-30 DIAGNOSIS — R140 Abdominal distension (gaseous): Secondary | ICD-10-CM

## 2022-12-30 DIAGNOSIS — R1084 Generalized abdominal pain: Secondary | ICD-10-CM | POA: Insufficient documentation

## 2022-12-30 DIAGNOSIS — I10 Essential (primary) hypertension: Secondary | ICD-10-CM | POA: Diagnosis not present

## 2022-12-30 DIAGNOSIS — E1159 Type 2 diabetes mellitus with other circulatory complications: Secondary | ICD-10-CM | POA: Diagnosis not present

## 2022-12-30 DIAGNOSIS — R109 Unspecified abdominal pain: Secondary | ICD-10-CM | POA: Diagnosis not present

## 2022-12-30 DIAGNOSIS — E119 Type 2 diabetes mellitus without complications: Secondary | ICD-10-CM

## 2022-12-30 DIAGNOSIS — I152 Hypertension secondary to endocrine disorders: Secondary | ICD-10-CM

## 2022-12-30 DIAGNOSIS — N1831 Chronic kidney disease, stage 3a: Secondary | ICD-10-CM

## 2022-12-30 DIAGNOSIS — R6881 Early satiety: Secondary | ICD-10-CM

## 2022-12-30 DIAGNOSIS — E1122 Type 2 diabetes mellitus with diabetic chronic kidney disease: Secondary | ICD-10-CM | POA: Diagnosis not present

## 2022-12-30 DIAGNOSIS — D539 Nutritional anemia, unspecified: Secondary | ICD-10-CM | POA: Diagnosis not present

## 2022-12-30 DIAGNOSIS — T502X5A Adverse effect of carbonic-anhydrase inhibitors, benzothiadiazides and other diuretics, initial encounter: Secondary | ICD-10-CM

## 2022-12-30 LAB — BASIC METABOLIC PANEL
BUN: 18 mg/dL (ref 6–23)
CO2: 30 mEq/L (ref 19–32)
Calcium: 9.9 mg/dL (ref 8.4–10.5)
Chloride: 100 mEq/L (ref 96–112)
Creatinine, Ser: 1.01 mg/dL (ref 0.40–1.20)
GFR: 54.01 mL/min — ABNORMAL LOW (ref >60.00)
Glucose, Bld: 212 mg/dL — ABNORMAL HIGH (ref 70–99)
Potassium: 3.3 mEq/L — ABNORMAL LOW (ref 3.5–5.1)
Sodium: 138 mEq/L (ref 135–145)

## 2022-12-30 LAB — IBC + FERRITIN
Ferritin: 59.4 ng/mL (ref 10.0–291.0)
Iron: 68 ug/dL (ref 42–145)
Saturation Ratios: 19.9 % — ABNORMAL LOW (ref 20.0–50.0)
TIBC: 341.6 ug/dL (ref 250.0–450.0)
Transferrin: 244 mg/dL (ref 212.0–360.0)

## 2022-12-30 LAB — HEMOGLOBIN A1C: Hgb A1c MFr Bld: 8.2 % — ABNORMAL HIGH (ref 4.6–6.5)

## 2022-12-30 LAB — TSH: TSH: 0.23 u[IU]/mL — ABNORMAL LOW (ref 0.35–5.50)

## 2022-12-30 LAB — AMYLASE: Amylase: 101 U/L (ref 27–131)

## 2022-12-30 LAB — LIPASE: Lipase: 26 U/L (ref 11.0–59.0)

## 2022-12-30 NOTE — Patient Instructions (Signed)

## 2022-12-30 NOTE — Progress Notes (Unsigned)
Subjective:  Patient ID: Bridget Butler, female    DOB: [birth year]  Age: 77 y.o. MRN: [id number]  CC: Abdominal Pain, Diabetes, Hypertension, and Hyperlipidemia   HPI Bridget Butler presents for f/up ---  She complains of a 17-monthhistory abdominal pain that has worsened over the last month.  After she eats she develops early satiety and 20 minutes later develops pain in bilateral lower quadrants.  She denies nausea, vomiting, odynophagia, dysphagia, fever, chills, melena, diarrhea, or constipation.  Outpatient Medications Prior to Visit  Medication Sig Dispense Refill   Blood Glucose Monitoring Suppl (TRUE METRIX METER) DEVI 1 Device by Does not apply route 2 (two) times daily. 1 Device 0   Cholecalciferol 50 MCG (2000 UT) TABS Take 1 tablet (2,000 Units total) by mouth daily. 90 tablet 1   Dulaglutide (TRULICITY) 3 [id number] Inject 3 mg into the skin once a week. 6 mL 3   glucose blood (TRUE METRIX BLOOD GLUCOSE TEST) test strip USE AS INSTRUCTED TO CHECK BLOOD SUGAR ONE TIME DAILY 100 strip 3   Insulin Glargine (BASAGLAR KWIKPEN) 100 UNIT/ML DIAL AND INJECT 16 UNITS UNDER THE SKIN DAILY. MAX DAILY DOSE IS 16 UNITS. 15 mL 0   Insulin Pen Needle (PEN NEEDLES) 32G X 5 MM MISC 1 Units by Does not apply route daily. 100 each 5   metFORMIN (GLUCOPHAGE-XR) 500 MG 24 hr tablet TAKE 2 TABLETS BY MOUTH ONCE DAILY WITH SUPPER 180 tablet 0   methimazole (TAPAZOLE) 5 MG tablet Take 0.5 tablets (2.5 mg total) by mouth daily. 30 tablet 5   Multiple Vitamin (MULTIVITAMIN WITH MINERALS) TABS tablet Take 1 tablet by mouth daily.     omeprazole (PRILOSEC) 40 MG capsule TAKE 1 CAPSULE BY MOUTH TWICE DAILY --  IN  THE  MORNING  AND  AT  BEDTIME 180 capsule 0   pravastatin (PRAVACHOL) 40 MG tablet Take 1 tablet (40 mg total) by mouth at bedtime. 90 tablet 0   repaglinide (PRANDIN) 1 MG tablet Take 1 tablet (1 mg total) by mouth daily before supper. 90 tablet 3   Tetrahydrozoline HCl (VISINE OP) Apply 1  drop to eye daily as needed (dry eyes).     losartan-hydrochlorothiazide (HYZAAR) 50-12.5 MG tablet Take 1 tablet by mouth once daily 90 tablet 0   No facility-administered medications prior to visit.    ROS Review of Systems  Constitutional:  Positive for unexpected weight change (wt loss). Negative for chills, diaphoresis and fatigue.  HENT: Negative.    Eyes: Negative.   Respiratory:  Negative for cough, chest tightness, shortness of breath and wheezing.   Cardiovascular:  Negative for chest pain, palpitations and leg swelling.  Gastrointestinal:  Positive for abdominal distention and abdominal pain. Negative for blood in stool, constipation, diarrhea, nausea and vomiting.  Endocrine: Negative.   Genitourinary: Negative.  Negative for difficulty urinating, dysuria and hematuria.  Musculoskeletal: Negative.   Skin: Negative.   Neurological: Negative.  Negative for dizziness, weakness and light-headedness.  Hematological:  Negative for adenopathy. Does not bruise/bleed easily.  Psychiatric/Behavioral: Negative.      Objective:  BP (!) 142/70 (BP Location: Right Arm, Patient Position: Sitting, Cuff Size: Large)   Pulse 92   Temp 98.3 F (36.8 C) (Oral)   Resp 16   Ht '5\' 3"'$  (1.6 m)   Wt 186 lb (84.4 kg)   SpO2 99%   BMI 32.95 kg/m   BP Readings from Last 3 Encounters:  12/30/22 (!) 142/70  09/01/22  128/60  05/01/22 140/78    Wt Readings from Last 3 Encounters:  12/30/22 186 lb (84.4 kg)  09/01/22 186 lb 9.6 oz (84.6 kg)  05/01/22 191 lb 3.2 oz (86.7 kg)    Physical Exam Vitals reviewed.  Constitutional:      Appearance: She is well-developed. She is obese. She is not ill-appearing.  HENT:     Nose: Nose normal.     Mouth/Throat:     Mouth: Mucous membranes are moist.  Eyes:     General: No scleral icterus.    Conjunctiva/sclera: Conjunctivae normal.  Cardiovascular:     Rate and Rhythm: Normal rate and regular rhythm.     Heart sounds: No murmur  heard. Pulmonary:     Effort: Pulmonary effort is normal.     Breath sounds: No stridor. No wheezing, rhonchi or rales.  Abdominal:     General: Abdomen is protuberant. Bowel sounds are normal. There is distension. There is no abdominal bruit. There are no signs of injury.     Palpations: Abdomen is soft. There is no hepatomegaly, splenomegaly or mass.     Tenderness: There is no abdominal tenderness. There is no guarding or rebound.     Hernia: No hernia is present.  Musculoskeletal:        General: Normal range of motion.     Cervical back: Neck supple.     Right lower leg: No edema.     Left lower leg: No edema.  Lymphadenopathy:     Cervical: No cervical adenopathy.  Skin:    General: Skin is warm and dry.     Coloration: Skin is not jaundiced.  Neurological:     General: No focal deficit present.     Mental Status: She is alert. Mental status is at baseline.  Psychiatric:        Mood and Affect: Mood normal.        Behavior: Behavior normal.     Lab Results  Component Value Date   WBC 5.8 12/30/2022   HGB 10.8 (L) 12/30/2022   HCT 33.6 (L) 12/30/2022   PLT 307.0 12/30/2022   GLUCOSE 212 (H) 12/30/2022   CHOL 173 09/01/2022   TRIG 173.0 (H) 09/01/2022   HDL 58.20 09/01/2022   LDLCALC 80 09/01/2022   ALT 18 05/01/2021   AST 19 05/01/2021   NA 138 12/30/2022   K 3.3 (L) 12/30/2022   CL 100 12/30/2022   CREATININE 1.01 12/30/2022   BUN 18 12/30/2022   CO2 30 12/30/2022   TSH 0.23 (L) 12/30/2022   HGBA1C 8.2 (H) 12/30/2022   MICROALBUR 1.0 05/01/2021    DG ABD ACUTE 2+V W 1V CHEST  Result Date: 12/30/2022 CLINICAL DATA:  Abdominal pain with eating. Distended abdomen for 6 weeks. EXAM: DG ABDOMEN ACUTE WITH 1 VIEW CHEST COMPARISON:  Chest radiographs 12/05/2019.  Chest CT 01/04/2020. FINDINGS: The heart size and mediastinal contours are stable. The lungs appear clear. There is no pleural effusion or pneumothorax. Supine and erect views of the abdomen demonstrated  normal nonobstructive bowel gas pattern. There is ingested material in the stomach. No evidence of free intraperitoneal air or suspicious abdominal calcification. Surgical clips from prior cholecystectomy are noted. A left pelvic calcification is likely a phlebolith. IMPRESSION: No evidence of acute cardiopulmonary or intra-abdominal process. Ingested material in the stomach. Electronically Signed   By: Richardean Sale M.D.   On: 12/30/2022 16:19     Assessment & Plan:   Bridget Butler was seen today  for abdominal pain, diabetes, hypertension and hyperlipidemia.  Diagnoses and all orders for this visit:  Essential hypertension- Her blood pressure is adequately well-controlled but she has developed hypokalemia.  Will discontinue the thiazide diuretic. -     Basic metabolic panel; Future -     CBC with Differential/Platelet; Future -     TSH; Future -     Urinalysis, Routine w reflex microscopic; Future -     Urinalysis, Routine w reflex microscopic -     TSH -     CBC with Differential/Platelet -     Basic metabolic panel  Type 2 diabetes mellitus with stage 3a chronic kidney disease, without long-term current use of insulin ([place])- Her A1c is up to 8.2%. -     Microalbumin / creatinine urine ratio; Future -     Hemoglobin A1c; Future -     Hemoglobin A1c -     Microalbumin / creatinine urine ratio  Deficiency anemia- Will evaluate for vitamin deficiencies. -     IBC + Ferritin; Future -     Vitamin B12; Future -     CBC with Differential/Platelet; Future -     Vitamin B1; Future -     Zinc; Future -     Folate; Future -     Reticulocytes; Future -     Reticulocytes -     Folate -     Zinc -     Vitamin B1 -     CBC with Differential/Platelet -     Vitamin B12 -     IBC + Ferritin  Stage 3a chronic kidney disease ([place])- Her renal function is stable. -     Microalbumin / creatinine urine ratio; Future -     Basic metabolic panel; Future -     Urinalysis, Routine w reflex microscopic;  Future -     Urinalysis, Routine w reflex microscopic -     Basic metabolic panel -     Microalbumin / creatinine urine ratio -     olmesartan (BENICAR) 20 MG tablet; Take 1 tablet (20 mg total) by mouth daily.  Generalized abdominal pain-plain films are normal.  She is anemic but other labs are normal.  There is no evidence of an acute abdominal process.  I am concerned about the weight loss so I recommended that she undergo a CT scan of the abdomen and pelvis with contrast to evaluate for mass, occult infection, malignancy. -     Lipase; Future -     Amylase; Future -     DG ABD ACUTE 2+V W 1V CHEST; Future -     Amylase -     Lipase -     CT Abdomen Pelvis W Contrast; Future  Abdominal distention -     Lipase; Future -     Amylase; Future -     DG ABD ACUTE 2+V W 1V CHEST; Future -     Amylase -     Lipase -     CT Abdomen Pelvis W Contrast; Future  Early satiety -     CT Abdomen Pelvis W Contrast; Future  Insulin-requiring or dependent type II diabetes mellitus ([place])  Hypertension associated with diabetes ([place]) -     amLODipine (NORVASC) 5 MG tablet; Take 1 tablet (5 mg total) by mouth daily. -     olmesartan (BENICAR) 20 MG tablet; Take 1 tablet (20 mg total) by mouth daily.   I have discontinued Bridget Butler's  losartan-hydrochlorothiazide. I am also having her start on amLODipine and olmesartan. Additionally, I am having her maintain her Pen Needles, True Metrix Meter, Tetrahydrozoline HCl (VISINE OP), Cholecalciferol, multivitamin with minerals, True Metrix Blood Glucose Test, Trulicity, omeprazole, repaglinide, methimazole, Basaglar KwikPen, metFORMIN, and pravastatin.  Meds ordered this encounter  Medications   amLODipine (NORVASC) 5 MG tablet    Sig: Take 1 tablet (5 mg total) by mouth daily.    Dispense:  90 tablet    Refill:  0   olmesartan (BENICAR) 20 MG tablet    Sig: Take 1 tablet (20 mg total) by mouth daily.    Dispense:  90 tablet    Refill:  0   I  spent 45 minutes in preparing to see the patient by review of recent labs, obtaining and reviewing separately obtained history, communicating with the patient, ordering medications and xrays, and documenting clinical information in the EHR including the differential Dx, treatment, and any further evaluation and management of multiple complex medical issues.     Follow-up: Return in about 3 weeks (around 01/20/2023).  Scarlette Calico, MD

## 2022-12-31 DIAGNOSIS — E1159 Type 2 diabetes mellitus with other circulatory complications: Secondary | ICD-10-CM | POA: Insufficient documentation

## 2022-12-31 DIAGNOSIS — E876 Hypokalemia: Secondary | ICD-10-CM | POA: Insufficient documentation

## 2022-12-31 DIAGNOSIS — T502X5A Adverse effect of carbonic-anhydrase inhibitors, benzothiadiazides and other diuretics, initial encounter: Secondary | ICD-10-CM | POA: Insufficient documentation

## 2022-12-31 DIAGNOSIS — R6881 Early satiety: Secondary | ICD-10-CM | POA: Insufficient documentation

## 2022-12-31 LAB — CBC WITH DIFFERENTIAL/PLATELET
Basophils Absolute: 0 10*3/uL (ref 0.0–0.1)
Basophils Relative: 0.5 % (ref 0.0–3.0)
Eosinophils Absolute: 0.1 10*3/uL (ref 0.0–0.7)
Eosinophils Relative: 0.9 % (ref 0.0–5.0)
HCT: 33.6 % — ABNORMAL LOW (ref 36.0–46.0)
Hemoglobin: 10.8 g/dL — ABNORMAL LOW (ref 12.0–15.0)
Lymphocytes Relative: 38 % (ref 12.0–46.0)
Lymphs Abs: 2.2 10*3/uL (ref 0.7–4.0)
MCHC: 32.3 g/dL (ref 30.0–36.0)
MCV: 97.2 fl (ref 78.0–100.0)
Monocytes Absolute: 0.4 10*3/uL (ref 0.1–1.0)
Monocytes Relative: 6.1 % (ref 3.0–12.0)
Neutro Abs: 3.2 10*3/uL (ref 1.4–7.7)
Neutrophils Relative %: 54.5 % (ref 43.0–77.0)
Platelets: 307 10*3/uL (ref 150.0–400.0)
RBC: 3.45 Mil/uL — ABNORMAL LOW (ref 3.87–5.11)
RDW: 14 % (ref 11.5–15.5)
WBC: 5.8 10*3/uL (ref 4.0–10.5)

## 2022-12-31 LAB — MICROALBUMIN / CREATININE URINE RATIO
Creatinine,U: 78.4 mg/dL
Microalb Creat Ratio: 26.3 mg/g (ref 0.0–30.0)
Microalb, Ur: 20.6 mg/dL — ABNORMAL HIGH (ref 0.0–1.9)

## 2022-12-31 LAB — URINALYSIS, ROUTINE W REFLEX MICROSCOPIC
Bilirubin Urine: NEGATIVE
Ketones, ur: NEGATIVE
Nitrite: NEGATIVE
Specific Gravity, Urine: 1.02 (ref 1.000–1.030)
Total Protein, Urine: 30 — AB
Urine Glucose: 100 — AB
Urobilinogen, UA: 0.2 (ref 0.0–1.0)
pH: 6 (ref 5.0–8.0)

## 2022-12-31 LAB — VITAMIN B12: Vitamin B-12: 466 pg/mL (ref 211–911)

## 2022-12-31 LAB — FOLATE: Folate: >23.9 ng/mL (ref >5.9)

## 2022-12-31 MED ORDER — AMLODIPINE BESYLATE 5 MG PO TABS: 5.0000 mg | ORAL_TABLET | Freq: Every day | ORAL | 0 refills | Status: DC

## 2022-12-31 MED ORDER — POTASSIUM CHLORIDE ER 10 MEQ PO TBCR: 10.0000 meq | EXTENDED_RELEASE_TABLET | Freq: Two times a day (BID) | ORAL | 0 refills | Status: DC

## 2022-12-31 MED ORDER — OLMESARTAN MEDOXOMIL 20 MG PO TABS: 20.0000 mg | ORAL_TABLET | Freq: Every day | ORAL | 0 refills | Status: DC

## 2023-01-01 ENCOUNTER — Encounter: Payer: Self-pay | Admitting: Internal Medicine

## 2023-01-01 ENCOUNTER — Ambulatory Visit: Payer: Medicare PPO | Admitting: Internal Medicine

## 2023-01-01 VITALS — BP 128/74 | HR 97 | Ht 63.0 in | Wt 189.0 lb

## 2023-01-01 DIAGNOSIS — E0590 Thyrotoxicosis, unspecified without thyrotoxic crisis or storm: Secondary | ICD-10-CM | POA: Diagnosis not present

## 2023-01-01 NOTE — Patient Instructions (Addendum)
Please continue: - Metformin ER 1000 mg at bedtime - Prandin 1-2 mg daily before dinner  - Basaglar 18 units in am - Trulicity 3 mg weekly   Please take consistently: - Methimazole 5 mg every other day  Please return in 3 months with your sugar log.

## 2023-01-01 NOTE — Progress Notes (Signed)
00. Patient ID: Bridget Butler, female   DOB: 1946-06-30, 77 y.o.   MRN: [id number]   HPI  Bridget Butler is a 77 y.o.-year-old female, initially referred by her PCP, Dr. Brigitte Pulse, returning for follow-up for toxic thyroid adenomas with subclinical thyrotoxicosis and now also uncontrolled DM2, insulin-dependent, with complications (CKD, DR, PN).  Last visit 4 months ago.  Interim history: No increased urination, blurry vision, nausea, chest pain. Has stomach distention. She has blurry vision due to dry eyes.  Toxic thyroid nodules:  Reviewed history: She was diagnosed with hyperthyroidism approximately 2010. She was seeing Dr. Jeanann Lewandowsky - he retired. She was started on medication (cannot remember name) >> felt "off" >> stopped in ~2010.  I recommended to add methimazole 2.5 mg in 08/2022.  She has been taking this inconsistently.  Reviewed her TFTs: Lab Results  Component Value Date   TSH 0.23 (L) 12/30/2022   TSH 0.20 (L) 09/01/2022   TSH 0.36 12/31/2021   TSH 0.38 01/14/2021   TSH 0.57 07/18/2020   TSH 0.148 (L) 06/04/2020   TSH 0.03 (L) 04/11/2019   TSH 0.03 (L) 10/29/2018   TSH 0.054 (L) 07/08/2018   TSH 0.118 (L) 03/29/2018   FREET4 0.90 09/01/2022   FREET4 0.90 01/14/2021   FREET4 0.66 07/18/2020   FREET4 0.93 06/08/2020   FREET4 0.84 04/11/2019   FREET4 0.97 10/29/2018   FREET4 1.05 07/08/2018   T3FREE 3.3 09/01/2022   T3FREE 3.0 01/14/2021   T3FREE 3.1 07/18/2020   T3FREE 3.0 04/11/2019   T3FREE 3.8 10/29/2018   T3FREE 3.3 07/08/2018   Her TSI antibodies were not elevated: Lab Results  Component Value Date   TSI <89 10/29/2018    Thyroid uptake (08/28/2007):  There is heterogeneous uptake within a normal sized gland. There are regions of photopenia within the bilateral upper poles and in the left lower pole. The most intense uptake is in the mid pole of the right lobe of the thyroid gland. Decreased uptake in the inferior pole of the right lobe of the thyroid  gland additionally.   Calculated 24-hour I-131 uptake is equal to 25.0%, which is normal (normal 10-30%).   IMPRESSION:   1. Heterogeneous uptake within the thyroid gland could represent resolving subacute thyroiditis in patient with a normal uptake.  2. Cannot exclude a multinodular goiter if TSH continues to remain depressed  We obtained a thyroid uptake and scan (11/30/2018): Normal uptake but multinodular goiter with warm nodules at inferior thyroid poles:  4 hour I-123 uptake = 8.6% (normal 5-20%) 24 hour I-123 uptake = 25.3% (normal 10-30%)   IMPRESSION: Normal 4 hour and 24 hour radio iodine uptakes.   Multinodular thyroid gland with warm nodules at the inferior poles of both thyroid lobes, corresponding to dominant masses identified on a remote thyroid ultrasound consistent with adenomas.  No FH of thyroid disease or thyroid cancer. No h/o radiation tx to head or neck. No herbal supplements. No Biotin use.   Multinodular goiter per ultrasound from 06/28/2013.   Biopsies were benign on 07/13/2013.  Pt denies: - feeling nodules in neck - hoarseness - dysphagia - choking  DM2: -Diagnosed: 9092 - insulin started: ~2006  Reviewed history: Her diabetes became uncontrolled after her first laryngeal surgery in 03/2020.  She was on steroids at the time of her surgery.  Reviewed HbA1c levels: Lab Results  Component Value Date   HGBA1C 8.2 (H) 12/30/2022   HGBA1C 7.7 (A) 09/01/2022   HGBA1C 7.2 (A) 05/01/2022  HGBA1C 7.7 (H) 12/31/2021   HGBA1C 7.5 (A) 09/09/2021   HGBA1C 7.7 (H) 05/01/2021   HGBA1C 7.5 (A) 01/14/2021   HGBA1C 7.7 (A) 10/15/2020   HGBA1C 9.1 (A) 08/23/2020   HGBA1C 10.4 (H) 07/11/2020   She is on: - Janumet 50-1000 mg at bedtime >> Metformin ER 1000 mg at bedtime - Prandin 1-2 mg before dinner - added [id number] - Trulicity [id number] >> 1.5 >> 3 x1 mo  -gets these through patient assistance  - Basaglar 16 >> 14 >> 16 >> 14-16 >> 16 >> 18 units in a.m.  She  had GI intolerance with regular Metformin. Coral Ceo >> $$$.  Pt checks her sugars once a day: - am: 96-132, 158 (late dinner) >> 90s-109, 200 >> 98, 112-161, 183, 202 >> 94-148 - 2h after b'fast: n/c - before lunch: n/c >> 154 >> n/c - 2h after lunch: n/c >> 178 >> 154, 159 >> 83-216, 229 >> n/c >> 154 >> n/c - before dinner: n/c >> 211>> n/c - 2h after dinner: n/c - bedtime: n/c >> 152 >> 252 >> 133-229, 237 >> n/c >> 128-282, 305 >> n/c - nighttime: n/c Lowest sugar was 63 >> 69 >> 96 >> 90 >> 98 >> 94; she has hypoglycemia awareness at 70.  Highest sugar was 400s >> .[name] KitchenMarland Kitchen252 (no meds) >> 237 >> 200 (ate late) >> 305 >> 186.  Glucometer: True Metrix  Pt's meals are: - Breakfast: eggs, [place] sausage or oatmeal >> coffee  - Lunch: salad + eggs + [place] meat (no pork) >> sandwich - Dinner: veggies + chicken/turkey and fish - Snacks: not after sx  -+ Mild CKD, last BUN/creatinine:  Lab Results  Component Value Date   BUN 18 12/30/2022   BUN 19 12/31/2021   CREATININE 1.01 12/30/2022   CREATININE 0.98 12/31/2021  On losartan 50  -+ HL; last set of lipids: Lab Results  Component Value Date   CHOL 173 09/01/2022   HDL 58.20 09/01/2022   LDLCALC 80 09/01/2022   TRIG 173.0 (H) 09/01/2022   CHOLHDL 3 09/01/2022  On pravastatin 40.  - last eye exam was on 2023: + DR, stable reportedly. Sees retina specialist. Dr. Sherlynn Stalls. Coming up next mo.  -+ Improved numbness and tingling in her feet.  Last foot exam 01/09/2022.  On ASA 81  Pt has FH of DM in mother.  She also has a history of GERD, osteopenia, microlaryngoscopy for subglottic stenosis 06 and 06/2020.  ROS:  + see HPI  I reviewed pt's medications, allergies, PMH, social hx, family hx, and changes were documented in the history of present illness. Otherwise, unchanged from my initial visit note.  Past Medical History:  Diagnosis Date   Cataract    Diabetes mellitus without complication (HCC)    Dyspnea     on exertion   GERD (gastroesophageal reflux disease)    Hyperlipidemia    Hypertension    Multiple thyroid nodules    Thyroid disease    Past Surgical History:  Procedure Laterality Date   CHOLECYSTECTOMY     COSMETIC SURGERY Bilateral    eyelids   EYE SURGERY     GALLBLADDER SURGERY     MICROLARYNGOSCOPY WITH CO2 LASER AND EXCISION OF VOCAL CORD LESION N/A 04/02/2020   Procedure: MICROLARYNGOSCOPY WITH DILATION AND CO2 LASER AND EXCISION OF VOCAL CORD LESION w/MITOMYCIN C;  Surgeon: Melida Quitter, MD;  Location: [facility];  Service: ENT;  Laterality: N/A;   MICROLARYNGOSCOPY WITH DILATION N/A  05/23/2020   Procedure: MICROLARYNGOSCOPY W/ DILATION AND JET VENTILATION;  Surgeon: Melida Quitter, MD;  Location: [facility];  Service: ENT;  Laterality: N/A;   MICROLARYNGOSCOPY WITH DILATION N/A 07/13/2020   Procedure: MICROLARYNGOSCOPY with Jet Ventilation;  Surgeon: Melida Quitter, MD;  Location: [facility];  Service: ENT;  Laterality: N/A;   TRACHEOSTOMY TUBE PLACEMENT N/A 04/02/2020   Procedure: Awake TRACHEOSTOMY;  Surgeon: Melida Quitter, MD;  Location: [facility];  Service: ENT;  Laterality: N/A;   TUBAL LIGATION     Social History   Socioeconomic History   Marital status: Widowed    Spouse name: Not on file   Number of children: 3   Years of education: 87   Highest education level: Not on file  Occupational History   Not on file  Tobacco Use   Smoking status: Never   Smokeless tobacco: Never  Vaping Use   Vaping Use: Never used  Substance and Sexual Activity   Alcohol use: No   Drug use: No   Sexual activity: Not Currently    Partners: Male  Other Topics Concern   Not on file  Social History Narrative   Mrs Arterberry is a 77 year old Jehovah witness patient    She receives support from her daughter primarily who also assists with transportation to medical appointments.    She is presently independent with her ADLs and iADLs      Social Determinants of Health   Financial Resource Strain: Low  Risk  (09/11/2021)   Overall Financial Resource Strain (CARDIA)    Difficulty of Paying Living Expenses: Not hard at all  Food Insecurity: No Food Insecurity (09/15/2022)   Hunger Vital Sign    Worried About Running Out of Food in the Last Year: Never true    Ran Out of Food in the Last Year: Never true  Transportation Needs: No Transportation Needs (09/15/2022)   PRAPARE - Hydrologist (Medical): No    Lack of Transportation (Non-Medical): No  Physical Activity: Sufficiently Active (09/15/2022)   Exercise Vital Sign    Days of Exercise per Week: 7 days    Minutes of Exercise per Session: 30 min  Stress: No Stress Concern Present (09/15/2022)   [place]    Feeling of Stress : Not at all  Social Connections: Moderately Integrated (09/15/2022)   Social Connection and Isolation Panel [NHANES]    Frequency of Communication with Friends and Family: More than three times a week    Frequency of Social Gatherings with Friends and Family: More than three times a week    Attends Religious Services: More than 4 times per year    Active Member of [employer] or Organizations: Yes    Attends Archivist Meetings: More than 4 times per year    Marital Status: Widowed  Intimate Partner Violence: Not At Risk (09/15/2022)   Humiliation, Afraid, Rape, and Kick questionnaire    Fear of Current or Ex-Partner: No    Emotionally Abused: No    Physically Abused: No    Sexually Abused: No   Current Outpatient Medications on File Prior to Visit  Medication Sig Dispense Refill   amLODipine (NORVASC) 5 MG tablet Take 1 tablet (5 mg total) by mouth daily. 90 tablet 0   Blood Glucose Monitoring Suppl (TRUE METRIX METER) DEVI 1 Device by Does not apply route 2 (two) times daily. 1 Device 0   Cholecalciferol 50 MCG (2000  UT) TABS Take 1 tablet (2,000 Units total) by mouth daily. 90 tablet 1   Dulaglutide  (TRULICITY) 3 0000000 SOPN Inject 3 mg into the skin once a week. 6 mL 3   glucose blood (TRUE METRIX BLOOD GLUCOSE TEST) test strip USE AS INSTRUCTED TO CHECK BLOOD SUGAR ONE TIME DAILY 100 strip 3   Insulin Glargine (BASAGLAR KWIKPEN) 100 UNIT/ML DIAL AND INJECT 16 UNITS UNDER THE SKIN DAILY. MAX DAILY DOSE IS 16 UNITS. 15 mL 0   Insulin Pen Needle (PEN NEEDLES) 32G X 5 MM MISC 1 Units by Does not apply route daily. 100 each 5   metFORMIN (GLUCOPHAGE-XR) 500 MG 24 hr tablet TAKE 2 TABLETS BY MOUTH ONCE DAILY WITH SUPPER 180 tablet 0   methimazole (TAPAZOLE) 5 MG tablet Take 0.5 tablets (2.5 mg total) by mouth daily. 30 tablet 5   Multiple Vitamin (MULTIVITAMIN WITH MINERALS) TABS tablet Take 1 tablet by mouth daily.     olmesartan (BENICAR) 20 MG tablet Take 1 tablet (20 mg total) by mouth daily. 90 tablet 0   omeprazole (PRILOSEC) 40 MG capsule TAKE 1 CAPSULE BY MOUTH TWICE DAILY --  IN  THE  MORNING  AND  AT  BEDTIME 180 capsule 0   potassium chloride (KLOR-CON 10) 10 MEQ tablet Take 1 tablet (10 mEq total) by mouth 2 (two) times daily. 180 tablet 0   pravastatin (PRAVACHOL) 40 MG tablet Take 1 tablet (40 mg total) by mouth at bedtime. 90 tablet 0   repaglinide (PRANDIN) 1 MG tablet Take 1 tablet (1 mg total) by mouth daily before supper. 90 tablet 3   Tetrahydrozoline HCl (VISINE OP) Apply 1 drop to eye daily as needed (dry eyes).     No current facility-administered medications on file prior to visit.   No Known Allergies Family History  Problem Relation Age of Onset   Heart disease Mother    Hypertension Mother    Cancer Sister    Diabetes Sister    PE: BP 128/74 (BP Location: Right Arm, Patient Position: Sitting, Cuff Size: Normal)   Pulse 97   Ht '5\' 3"'$  (1.6 m)   Wt 189 lb (85.7 kg)   SpO2 98%   BMI 33.48 kg/m   Wt Readings from Last 3 Encounters:  01/01/23 189 lb (85.7 kg)  12/30/22 186 lb (84.4 kg)  09/01/22 186 lb 9.6 oz (84.6 kg)   Constitutional: overweight, in  NAD Eyes: EOMI, no exophthalmos ENT: no thyromegaly, no thyroid masses palpated, no cervical lymphadenopathy Cardiovascular: Tachycardia, RR, No MRG, +B LE edema Respiratory: CTA B Musculoskeletal: no deformities Skin: no rashes Neurological: no tremor with outstretched hands Diabetic Foot Exam - Simple   Simple Foot Form Diabetic Foot exam was performed with the following findings: Yes 01/01/2023  1:54 PM  Visual Inspection No deformities, no ulcerations, no other skin breakdown bilaterally: Yes Sensation Testing Intact to touch and monofilament testing bilaterally: Yes Pulse Check Posterior Tibialis and Dorsalis pulse intact bilaterally: Yes Comments + mild pitting edema in B feet    ASSESSMENT: 1. DM2-insulin dependent, uncontrolled, with complications - CKD - DR - PN  2. Thyrotoxic adenomas  3. Multiple thyroid nodules  PLAN:  1. DM2 -Patient with longstanding diabetes, on metformin ER, weekly GLP-1 receptor agonist and long-acting insulin.  At last visit, sugars were higher.  This could have been related to her later dinners.  She did have sugars in the 200s and even 1 value at 305 after dinner.  We discussed  about possibly adding mealtime insulin before dinner but we decided to add meglitinide first.  We discussed about adding 1 mg before regular meals and 2 mg before larger meals.  We continued her metformin, Basaglar, and Trulicity doses.  HbA1c at last visit was higher, at 7.7%.  However, she had another HbA1c 2 days ago and this was higher, at 8.2%. -At today's visit, sugars are variable in the morning mostly after 140s, however, it is difficult to obtain this information from her meter, as the time and date is off.  We corrected this at today's visit.  For now, I advised her to make sure she is taking the diabetic medicine as advised, but will not change her regimen. - I advised the pt to: Patient Instructions  Please continue: - Metformin ER 1000 mg at bedtime -  Prandin 1-2 mg daily before dinner  - Basaglar 18 units in am - Trulicity 3 mg weekly   Please take consistently: - Methimazole 5 mg every other day  Please return in 3 months with your sugar log.   - advised to check sugars at different times of the day - 1x a day, rotating check times - advised for yearly eye exams >> she is UTD - return to clinic in 3 months  2. Patient with a long history of low TSH without thyrotoxic symptoms: She denies weight loss, heat intolerance, hyperdefecation, palpitations, anxiety. -We did check an uptake and scan which showed toxic adenomas in the inferior thyroid lobes -We did not have to intervene in the past since she only had a mildly suppressed TSH with normal free T4 and free T3.  In 03/2019, TSH was quite low, 0.03 and we discussed about RAI treatment, however, interestingly, TFTs improved afterwards. -At last visit, however, her TSH was suppressed so I suggested to start a low-dose methimazole, 2.5 mg daily.  She did not return for follow-up labs afterwards.  However, TSH obtained 2 days ago was still suppressed: Lab Results  Component Value Date   TSH 0.23 (L) 12/30/2022  -she tells me that she is taking methimazole very inconsistently.  We discussed about the importance to take it daily.  We did discuss about the possibility of taking it every other day, as she does not like cutting the pill. -No thyrotoxic signs or symptoms, but her pulse is high today -We will recheck her TFTs in 1 month  3. Multiple thyroid nodules -No neck compression symptoms -The 2 dominant nodules were biopsied with benign results in 2014 -Reviewing the thyroid uptake and scan report from 2020, she had more nodules in the lower poles of the thyroid but without indication for biopsy -We discussed in the past that RAI treatment to shrink these nodules, in case we decided to go this route, however, she did not have RAI treatment yet. - we are managing her  conservatively  Orders Placed This Encounter  Procedures   TSH   T4, free   T3, free   Philemon Kingdom, MD PhD [facility] Endocrinology

## 2023-01-07 LAB — VITAMIN B1: Vitamin B1 (Thiamine): 32 nmol/L — ABNORMAL HIGH (ref 8–30)

## 2023-01-07 LAB — RETICULOCYTES
ABS Retic: 51000 cells/uL (ref 20000–80000)
Retic Ct Pct: 1.5 %

## 2023-01-07 LAB — ZINC: Zinc: 73 ug/dL (ref 60–130)

## 2023-01-18 ENCOUNTER — Other Ambulatory Visit: Payer: Self-pay | Admitting: Internal Medicine

## 2023-01-19 ENCOUNTER — Telehealth: Payer: Self-pay | Admitting: Internal Medicine

## 2023-01-19 DIAGNOSIS — E785 Hyperlipidemia, unspecified: Secondary | ICD-10-CM

## 2023-01-19 MED ORDER — PRAVASTATIN SODIUM 40 MG PO TABS: 40.0000 mg | ORAL_TABLET | Freq: Every day | ORAL | 0 refills | Status: DC

## 2023-01-19 NOTE — Telephone Encounter (Signed)
Caller & Relationship to patient: Destry - self  Call back number: [phone]  Date of last office visit: 12-30-22  Date of next office visit: None scheduled  Medication(s) to be refilled: pravastatin (PRAVACHOL) 40 MG tablet    metFORMIN (GLUCOPHAGE-XR) 500 MG 24 hr tablet      Preferred Pharmacy: [facility] ([facility]), [place] [facility] [place]

## 2023-01-20 ENCOUNTER — Telehealth: Payer: Self-pay | Admitting: Internal Medicine

## 2023-01-20 ENCOUNTER — Other Ambulatory Visit: Payer: Self-pay | Admitting: Internal Medicine

## 2023-01-20 DIAGNOSIS — E1122 Type 2 diabetes mellitus with diabetic chronic kidney disease: Secondary | ICD-10-CM

## 2023-01-20 MED ORDER — TRUE METRIX METER DEVI: 1.0000 | Freq: Two times a day (BID) | 2 refills | Status: AC

## 2023-01-20 MED ORDER — TRUE METRIX METER DEVI: 1.0000 | Freq: Two times a day (BID) | 0 refills | Status: DC

## 2023-01-20 MED ORDER — TRUE METRIX BLOOD GLUCOSE TEST VI STRP: 1.0000 | ORAL_STRIP | Freq: Two times a day (BID) | 4 refills | Status: DC

## 2023-01-20 MED ORDER — TRUE METRIX BLOOD GLUCOSE TEST VI STRP: ORAL_STRIP | 3 refills | Status: DC

## 2023-01-20 NOTE — Telephone Encounter (Signed)
Prescription Request  01/20/2023  LOV: 12/30/2022  What is the name of the medication or equipment?  Blood Glucose Monitoring Suppl (TRUE METRIX METER) DEVI  glucose blood (TRUE METRIX BLOOD GLUCOSE TEST) test strip   Have you contacted your pharmacy to request a refill? Yes   Which pharmacy would you like this sent to?  [facility] through [facility]    Patient notified that their request is being sent to the clinical staff for review and that they should receive a response within 2 business days.   Please advise at [facility] [phone]

## 2023-01-28 ENCOUNTER — Ambulatory Visit
Admission: RE | Admit: 2023-01-28 | Discharge: 2023-01-28 | Disposition: A | Discharge status: Home / Self Care | Payer: Medicare PPO | Source: Ambulatory Visit | Attending: Internal Medicine | Admitting: Internal Medicine

## 2023-01-28 DIAGNOSIS — R6881 Early satiety: Secondary | ICD-10-CM

## 2023-01-28 DIAGNOSIS — K4020 Bilateral inguinal hernia, without obstruction or gangrene, not specified as recurrent: Secondary | ICD-10-CM | POA: Diagnosis not present

## 2023-01-28 DIAGNOSIS — R140 Abdominal distension (gaseous): Secondary | ICD-10-CM | POA: Diagnosis not present

## 2023-01-28 DIAGNOSIS — K769 Liver disease, unspecified: Secondary | ICD-10-CM | POA: Diagnosis not present

## 2023-01-28 DIAGNOSIS — N3289 Other specified disorders of bladder: Secondary | ICD-10-CM | POA: Diagnosis not present

## 2023-01-28 DIAGNOSIS — R1084 Generalized abdominal pain: Secondary | ICD-10-CM

## 2023-01-28 MED ORDER — IOPAMIDOL (ISOVUE-300) INJECTION 61%
100.0000 mL | Freq: Once | INTRAVENOUS | Status: AC | PRN
  Administered 2023-01-28: 100 mL via INTRAVENOUS

## 2023-02-12 ENCOUNTER — Other Ambulatory Visit (INDEPENDENT_AMBULATORY_CARE_PROVIDER_SITE_OTHER): Payer: Medicare PPO

## 2023-02-12 DIAGNOSIS — E0590 Thyrotoxicosis, unspecified without thyrotoxic crisis or storm: Secondary | ICD-10-CM | POA: Diagnosis not present

## 2023-02-12 LAB — TSH: TSH: 0.31 u[IU]/mL — ABNORMAL LOW (ref 0.35–5.50)

## 2023-02-12 LAB — T4, FREE: Free T4: 0.72 ng/dL (ref 0.60–1.60)

## 2023-02-12 LAB — T3, FREE: T3, Free: 3 pg/mL (ref 2.3–4.2)

## 2023-02-18 ENCOUNTER — Ambulatory Visit (INDEPENDENT_AMBULATORY_CARE_PROVIDER_SITE_OTHER): Payer: Medicare PPO | Admitting: Internal Medicine

## 2023-02-18 ENCOUNTER — Encounter: Payer: Self-pay | Admitting: Internal Medicine

## 2023-02-18 VITALS — BP 136/62 | HR 89 | Temp 98.2°F | Ht 63.0 in | Wt 186.0 lb

## 2023-02-18 DIAGNOSIS — Z7985 Long-term (current) use of injectable non-insulin antidiabetic drugs: Secondary | ICD-10-CM

## 2023-02-18 DIAGNOSIS — K591 Functional diarrhea: Secondary | ICD-10-CM | POA: Diagnosis not present

## 2023-02-18 DIAGNOSIS — E876 Hypokalemia: Secondary | ICD-10-CM | POA: Diagnosis not present

## 2023-02-18 DIAGNOSIS — T502X5A Adverse effect of carbonic-anhydrase inhibitors, benzothiadiazides and other diuretics, initial encounter: Secondary | ICD-10-CM | POA: Diagnosis not present

## 2023-02-18 DIAGNOSIS — Z7984 Long term (current) use of oral hypoglycemic drugs: Secondary | ICD-10-CM | POA: Diagnosis not present

## 2023-02-18 DIAGNOSIS — D638 Anemia in other chronic diseases classified elsewhere: Secondary | ICD-10-CM | POA: Diagnosis not present

## 2023-02-18 DIAGNOSIS — E1122 Type 2 diabetes mellitus with diabetic chronic kidney disease: Secondary | ICD-10-CM | POA: Diagnosis not present

## 2023-02-18 DIAGNOSIS — Z794 Long term (current) use of insulin: Secondary | ICD-10-CM | POA: Diagnosis not present

## 2023-02-18 DIAGNOSIS — I10 Essential (primary) hypertension: Secondary | ICD-10-CM | POA: Diagnosis not present

## 2023-02-18 DIAGNOSIS — N1831 Chronic kidney disease, stage 3a: Secondary | ICD-10-CM | POA: Diagnosis not present

## 2023-02-18 LAB — CBC WITH DIFFERENTIAL/PLATELET
Basophils Absolute: 0 10*3/uL (ref 0.0–0.1)
Basophils Relative: 0.7 % (ref 0.0–3.0)
Eosinophils Absolute: 0.1 10*3/uL (ref 0.0–0.7)
Eosinophils Relative: 1.5 % (ref 0.0–5.0)
HCT: 33.1 % — ABNORMAL LOW (ref 36.0–46.0)
Hemoglobin: 11.1 g/dL — ABNORMAL LOW (ref 12.0–15.0)
Lymphocytes Relative: 40.9 % (ref 12.0–46.0)
Lymphs Abs: 1.9 10*3/uL (ref 0.7–4.0)
MCHC: 33.5 g/dL (ref 30.0–36.0)
MCV: 97.1 fl (ref 78.0–100.0)
Monocytes Absolute: 0.4 10*3/uL (ref 0.1–1.0)
Monocytes Relative: 7.8 % (ref 3.0–12.0)
Neutro Abs: 2.2 10*3/uL (ref 1.4–7.7)
Neutrophils Relative %: 49.1 % (ref 43.0–77.0)
Platelets: 313 10*3/uL (ref 150.0–400.0)
RBC: 3.41 Mil/uL — ABNORMAL LOW (ref 3.87–5.11)
RDW: 14.1 % (ref 11.5–15.5)
WBC: 4.6 10*3/uL (ref 4.0–10.5)

## 2023-02-18 LAB — BASIC METABOLIC PANEL
BUN: 13 mg/dL (ref 6–23)
CO2: 28 mEq/L (ref 19–32)
Calcium: 9.8 mg/dL (ref 8.4–10.5)
Chloride: 103 mEq/L (ref 96–112)
Creatinine, Ser: 1.1 mg/dL (ref 0.40–1.20)
GFR: 48.7 mL/min — ABNORMAL LOW (ref >60.00)
Glucose, Bld: 128 mg/dL — ABNORMAL HIGH (ref 70–99)
Potassium: 4.6 mEq/L (ref 3.5–5.1)
Sodium: 138 mEq/L (ref 135–145)

## 2023-02-18 MED ORDER — EMPAGLIFLOZIN 10 MG PO TABS
10.0000 mg | ORAL_TABLET | Freq: Every day | ORAL | 0 refills | Status: DC
Start: 2023-02-18 — End: 2023-05-04

## 2023-02-18 NOTE — Patient Instructions (Signed)
Chronic Diarrhea Chronic diarrhea is when a person passes frequent loose and sometimes watery stools for 4 weeks or longer. Non-chronic diarrhea usually lasts for only 2-3 days. Diarrhea can cause a person to feel weak and become dehydrated. Dehydration is a condition in which there is not enough water or other fluids in the body. Dehydration can make the person tired and thirsty. It can also cause a dry mouth, decreased urination, and dark yellow urine. Diarrhea is a sign of an underlying problem, such as: Infection. Side effects of medicines. Problems digesting something in your diet, such as milk products if you have lactose intolerance. Conditions such as celiac disease, irritable bowel syndrome (IBS), or inflammatory bowel disease (IBD). If you have chronic diarrhea, make sure you treat it as told by your health care provider. Follow these instructions at home: Medicines Take over-the-counter and prescription medicines only as told by your health care provider. If you were prescribed antibiotics, take them as told by your health care provider. Do not stop taking the antibiotic even if you start to feel better. Eating and drinking  Follow instructions from your health care provider about what to eat and drink. You may have to: Avoid foods that trigger diarrhea for you. Take an oral rehydration solution (ORS). This is a drink that keeps you hydrated. It can be found at pharmacies and retail stores. Drink clear fluids, such as water, diluted fruit juice, and low-calorie sports drinks. You can also get fluids by sucking on ice chips. Drink enough fluid to keep your urine pale yellow. This will help you avoid dehydration. Eat small amounts of bland foods that are easy to digest as you are able. These foods include bananas, applesauce, rice, lean meats, toast, and crackers. Avoid spicy or fatty foods. Avoid foods and drinks that contain a lot of sugar or caffeine. Do not drink alcohol if: Your  health care provider tells you not to drink. You are pregnant, may be pregnant, or are planning to become pregnant. If you drink alcohol: Limit how much you have to: 0-1 drink a day for women. 0-2 drinks a day for men. Know how much alcohol is in your drink. In the [place] one drink equals one 12 oz bottle of beer (355 mL), one 5 oz glass of wine (148 mL), or one 1 oz glass of hard liquor (44 mL). General instructions  Wash your hands often and after each diarrhea episode for at least 20 seconds. Use soap and water. If soap and water are not available, use hand sanitizer. Make sure that all people in your household wash their hands well and often. Rest as told by your health care provider. Take a warm bath to relieve any burning or pain from frequent diarrhea episodes. Watch your condition for any changes. Contact a health care provider if: You have a fever. Your diarrhea gets worse or does not get better. You have new symptoms. You vomit every time you eat or drink. You feel light-headed or dizzy. You have muscle cramps. You have severe pain in the rectum. You have signs of dehydration, such as: Dark urine, very little urine, or no urine. Cracked lips. Dry mouth. Sunken eyes. Sleepiness. Weakness. You have bloody or black stools, or stools that look like tar. You have severe pain, cramping, or bloating in your abdomen, or pain that stays in one place. Your skin feels cold and clammy. You feel confused. You have a severe headache. Get help right away if: You have chest pain   or your heart is beating very quickly. You have trouble breathing or you are breathing very quickly. You feel extremely weak or you faint. These symptoms may be an emergency. Get help right away. Call 911. Do not wait to see if the symptoms will go away. Do not drive yourself to the hospital. This information is not intended to replace advice given to you by your health care provider. Make sure you discuss  any questions you have with your health care provider. Document Revised: 03/25/2022 Document Reviewed: 03/25/2022 Elsevier Patient Education  2023 [employer]  

## 2023-02-18 NOTE — Progress Notes (Signed)
Subjective:  Patient ID: Bridget Butler, female    DOB: 1946-02-28  Age: 77 y.o. MRN: [id number]  CC: Anemia and Diabetes   HPI Bridget Butler presents for f/up ----  She complains of a 1 month history of diarrhea that she describes as 2 loose stools a day.  She has gotten some symptom relief with Imodium.  She also complains of poor appetite and weight loss but denies nausea, vomiting, abdominal pain, cramping, bright red blood per rectum, or melena.  Outpatient Medications Prior to Visit  Medication Sig Dispense Refill   amLODipine (NORVASC) 5 MG tablet Take 1 tablet (5 mg total) by mouth daily. 90 tablet 0   Blood Glucose Monitoring Suppl (TRUE METRIX METER) DEVI 1 Device by Does not apply route 2 (two) times daily. 1 each 2   Cholecalciferol 50 MCG (2000 UT) TABS Take 1 tablet (2,000 Units total) by mouth daily. 90 tablet 1   Dulaglutide (TRULICITY) 3 MG/0.5ML SOPN Inject 3 mg into the skin once a week. 6 mL 3   glucose blood (TRUE METRIX BLOOD GLUCOSE TEST) test strip 1 each by Other route 2 (two) times daily. USE AS INSTRUCTED TO CHECK BLOOD SUGAR ONE TIME DAILY 100 strip 4   Insulin Glargine (BASAGLAR KWIKPEN) 100 UNIT/ML DIAL AND INJECT 16 UNITS UNDER THE SKIN DAILY. MAX DAILY DOSE IS 16 UNITS. 15 mL 0   Insulin Pen Needle (PEN NEEDLES) 32G X 5 MM MISC 1 Units by Does not apply route daily. 100 each 5   methimazole (TAPAZOLE) 5 MG tablet Take 0.5 tablets (2.5 mg total) by mouth daily. 30 tablet 5   Multiple Vitamin (MULTIVITAMIN WITH MINERALS) TABS tablet Take 1 tablet by mouth daily.     olmesartan (BENICAR) 20 MG tablet Take 1 tablet (20 mg total) by mouth daily. 90 tablet 0   omeprazole (PRILOSEC) 40 MG capsule TAKE 1 CAPSULE BY MOUTH TWICE DAILY --  IN  THE  MORNING  AND  AT  BEDTIME 180 capsule 0   potassium chloride (KLOR-CON 10) 10 MEQ tablet Take 1 tablet (10 mEq total) by mouth 2 (two) times daily. 180 tablet 0   pravastatin (PRAVACHOL) 40 MG tablet Take 1 tablet (40 mg  total) by mouth at bedtime. 90 tablet 0   repaglinide (PRANDIN) 1 MG tablet Take 1 tablet (1 mg total) by mouth daily before supper. 90 tablet 3   Tetrahydrozoline HCl (VISINE OP) Apply 1 drop to eye daily as needed (dry eyes).     metFORMIN (GLUCOPHAGE-XR) 500 MG 24 hr tablet TAKE 2 TABLETS BY MOUTH ONCE DAILY WITH SUPPER 180 tablet 3   No facility-administered medications prior to visit.    ROS Review of Systems  Constitutional:  Positive for appetite change and unexpected weight change. Negative for activity change, chills, diaphoresis, fatigue and fever.  HENT: Negative.  Negative for trouble swallowing and voice change.   Eyes: Negative.   Respiratory: Negative.  Negative for cough, chest tightness, shortness of breath and wheezing.   Cardiovascular:  Negative for chest pain, palpitations and leg swelling.  Gastrointestinal:  Positive for diarrhea. Negative for abdominal pain, blood in stool, constipation, nausea and vomiting.  Endocrine: Negative.   Genitourinary: Negative.  Negative for difficulty urinating.  Musculoskeletal:  Negative for arthralgias, back pain, joint swelling and myalgias.  Skin: Negative.   Neurological:  Negative for dizziness, weakness and light-headedness.  Hematological:  Negative for adenopathy. Does not bruise/bleed easily.  Psychiatric/Behavioral: Negative.  Objective:  BP 136/62 (BP Location: Left Arm, Patient Position: Sitting, Cuff Size: Large)   Pulse 89   Temp 98.2 F (36.8 C) (Oral)   Ht 5\' 3"  (1.6 m)   Wt 186 lb (84.4 kg)   SpO2 97%   BMI 32.95 kg/m   BP Readings from Last 3 Encounters:  02/18/23 136/62  01/01/23 128/74  12/30/22 (!) 142/70    Wt Readings from Last 3 Encounters:  02/18/23 186 lb (84.4 kg)  01/01/23 189 lb (85.7 kg)  12/30/22 186 lb (84.4 kg)    Physical Exam Vitals reviewed.  Constitutional:      Appearance: Normal appearance.  HENT:     Nose: Nose normal.     Mouth/Throat:     Mouth: Mucous membranes  are moist.  Eyes:     General: No scleral icterus.    Conjunctiva/sclera: Conjunctivae normal.  Cardiovascular:     Rate and Rhythm: Normal rate and regular rhythm.     Heart sounds: No murmur heard.    No gallop.  Pulmonary:     Effort: Pulmonary effort is normal.     Breath sounds: No stridor. No wheezing, rhonchi or rales.  Abdominal:     General: Abdomen is flat.     Palpations: There is no mass.     Tenderness: There is no abdominal tenderness. There is no guarding.     Hernia: No hernia is present.  Musculoskeletal:        General: Normal range of motion.     Cervical back: Neck supple.     Right lower leg: No edema.     Left lower leg: No edema.  Lymphadenopathy:     Cervical: No cervical adenopathy.  Skin:    General: Skin is warm and dry.     Coloration: Skin is not pale.  Neurological:     General: No focal deficit present.     Mental Status: She is alert. Mental status is at baseline.  Psychiatric:        Mood and Affect: Mood normal.        Behavior: Behavior normal.     Lab Results  Component Value Date   WBC 4.6 02/18/2023   HGB 11.1 (L) 02/18/2023   HCT 33.1 (L) 02/18/2023   PLT 313.0 02/18/2023   GLUCOSE 128 (H) 02/18/2023   CHOL 173 09/01/2022   TRIG 173.0 (H) 09/01/2022   HDL 58.20 09/01/2022   LDLCALC 80 09/01/2022   ALT 18 05/01/2021   AST 19 05/01/2021   NA 138 02/18/2023   K 4.6 02/18/2023   CL 103 02/18/2023   CREATININE 1.10 02/18/2023   BUN 13 02/18/2023   CO2 28 02/18/2023   TSH 0.31 (L) 02/12/2023   HGBA1C 8.2 (H) 12/30/2022   MICROALBUR 20.6 (H) 12/30/2022    CT Abdomen Pelvis W Contrast  Result Date: 01/30/2023 CLINICAL DATA:  Abdominal pain and distension. Bloating, diarrhea and abdominal cramping. EXAM: CT ABDOMEN AND PELVIS WITH CONTRAST TECHNIQUE: Multidetector CT imaging of the abdomen and pelvis was performed using the standard protocol following bolus administration of intravenous contrast. RADIATION DOSE REDUCTION: This  exam was performed according to the departmental dose-optimization program which includes automated exposure control, adjustment of the mA and/or kV according to patient size and/or use of iterative reconstruction technique. CONTRAST:  ISOVUE-300 IOPAMIDOL (ISOVUE-300) INJECTION 61% COMPARISON:  None Available. FINDINGS: Lower chest: Lung bases are clear. Heart is enlarged. No pericardial or pleural effusion. Distal esophagus is grossly unremarkable.  Hepatobiliary: Subcentimeter low-attenuation lesion in the left hepatic lobe, too small to characterize but likely a cyst. Liver is otherwise unremarkable. Cholecystectomy. No biliary ductal dilatation. Pancreas: Negative. Spleen: Negative. Adrenals/Urinary Tract: Adrenal glands and kidneys are unremarkable. Ureters are decompressed. Marked bladder distension. Stomach/Bowel: Stomach, small bowel, appendix and colon are unremarkable. Vascular/Lymphatic: Atherosclerotic calcification of the aorta. No pathologically enlarged lymph nodes. Reproductive: Uterus is visualized.  No adnexal mass. Other: Small bilateral inguinal hernias contain fat.  No free fluid. Musculoskeletal: None. IMPRESSION: 1. Marked bladder distension.  No hydronephrosis. 2.  Aortic atherosclerosis (ICD10-I70.0). Electronically Signed   By: Leanna Battles M.D.   On: 01/30/2023 13:34    Assessment & Plan:   Functional diarrhea- Will discontinue metformin and evaluate for other causes of diarrhea. -     CBC with Differential/Platelet; Future -     Gliadin antibodies, serum -     Tissue transglutaminase, IgA -     Reticulin Antibody, IgA w titer  Anemia, chronic disease- Her H&H are stable. -     CBC with Differential/Platelet; Future -     Gliadin antibodies, serum -     Tissue transglutaminase, IgA -     Reticulin Antibody, IgA w titer  Essential hypertension- Her blood pressure is adequately well-controlled. -     CBC with Differential/Platelet; Future -     Basic metabolic  panel; Future  Diuretic-induced hypokalemia -     Basic metabolic panel; Future  Stage 3a chronic kidney disease (HCC) -     Empagliflozin; Take 1 tablet (10 mg total) by mouth daily before breakfast.  Dispense: 90 tablet; Refill: 0  Type 2 diabetes mellitus with stage 3a chronic kidney disease, without long-term current use of insulin (HCC)- Will start an SGLT2 inhibitor. -     Empagliflozin; Take 1 tablet (10 mg total) by mouth daily before breakfast.  Dispense: 90 tablet; Refill: 0     Follow-up: Return in about 3 months (around 05/21/2023).  Sanda Linger, MD

## 2023-02-19 LAB — GLIADIN ANTIBODIES, SERUM
Gliadin IgA: <1 U/mL
Gliadin IgG: <1 U/mL

## 2023-02-19 LAB — TISSUE TRANSGLUTAMINASE, IGA: (tTG) Ab, IgA: <1 U/mL

## 2023-02-20 LAB — RETICULIN ANTIBODIES, IGA W TITER: Reticulin Ab, IgA: NEGATIVE titer (ref <2.5)

## 2023-03-11 ENCOUNTER — Other Ambulatory Visit: Payer: Self-pay | Admitting: Internal Medicine

## 2023-03-11 ENCOUNTER — Telehealth: Payer: Self-pay | Admitting: Internal Medicine

## 2023-03-11 DIAGNOSIS — K219 Gastro-esophageal reflux disease without esophagitis: Secondary | ICD-10-CM

## 2023-03-11 MED ORDER — OMEPRAZOLE 40 MG PO CPDR: DELAYED_RELEASE_CAPSULE | ORAL | 0 refills | Status: DC

## 2023-03-11 NOTE — Telephone Encounter (Signed)
Prescription Request  03/11/2023  LOV: 02/18/2023  What is the name of the medication or equipment? omeprazole  Have you contacted your pharmacy to request a refill? Yes   Which pharmacy would you like this sent to?   [facility]   Patient notified that their request is being sent to the clinical staff for review and that they should receive a response within 2 business days.   Please advise at Mobile [phone] (mobile)

## 2023-03-23 ENCOUNTER — Other Ambulatory Visit: Payer: Self-pay | Admitting: Internal Medicine

## 2023-03-23 ENCOUNTER — Telehealth: Payer: Self-pay | Admitting: Internal Medicine

## 2023-03-23 DIAGNOSIS — K219 Gastro-esophageal reflux disease without esophagitis: Secondary | ICD-10-CM

## 2023-03-23 DIAGNOSIS — N1831 Chronic kidney disease, stage 3a: Secondary | ICD-10-CM

## 2023-03-23 DIAGNOSIS — I152 Hypertension secondary to endocrine disorders: Secondary | ICD-10-CM

## 2023-03-23 DIAGNOSIS — E876 Hypokalemia: Secondary | ICD-10-CM

## 2023-03-23 DIAGNOSIS — T502X5A Adverse effect of carbonic-anhydrase inhibitors, benzothiadiazides and other diuretics, initial encounter: Secondary | ICD-10-CM

## 2023-03-23 MED ORDER — OMEPRAZOLE 40 MG PO CPDR: DELAYED_RELEASE_CAPSULE | ORAL | 0 refills | Status: DC

## 2023-03-23 MED ORDER — POTASSIUM CHLORIDE ER 10 MEQ PO TBCR
10.0000 meq | EXTENDED_RELEASE_TABLET | Freq: Two times a day (BID) | ORAL | 0 refills | Status: DC
Start: 2023-03-23 — End: 2023-06-23

## 2023-03-23 MED ORDER — OLMESARTAN MEDOXOMIL 20 MG PO TABS: 20.0000 mg | ORAL_TABLET | Freq: Every day | ORAL | 0 refills | Status: DC

## 2023-03-23 MED ORDER — AMLODIPINE BESYLATE 5 MG PO TABS
5.0000 mg | ORAL_TABLET | Freq: Every day | ORAL | 0 refills | Status: DC
Start: 2023-03-23 — End: 2023-07-01

## 2023-03-23 NOTE — Telephone Encounter (Signed)
Prescription Request  03/23/2023  LOV: 02/18/2023  What is the name of the medication or equipment? Amlodopine, olmesartan, potassium chloride  Have you contacted your pharmacy to request a refill? No   Which pharmacy would you like this sent to?  [facility] [address] ([address] [place] - [address] [place]) [place] [address] Phone: [phone] Fax: [phone]     Patient notified that their request is being sent to the clinical staff for review and that they should receive a response within 2 business days.   Please advise at Mobile [phone] (mobile)

## 2023-05-04 ENCOUNTER — Ambulatory Visit: Payer: Medicare PPO | Admitting: Internal Medicine

## 2023-05-04 ENCOUNTER — Encounter: Payer: Self-pay | Admitting: Internal Medicine

## 2023-05-04 VITALS — BP 138/70 | HR 93 | Ht 63.0 in | Wt 190.0 lb

## 2023-05-04 DIAGNOSIS — E0590 Thyrotoxicosis, unspecified without thyrotoxic crisis or storm: Secondary | ICD-10-CM

## 2023-05-04 DIAGNOSIS — N1831 Chronic kidney disease, stage 3a: Secondary | ICD-10-CM | POA: Diagnosis not present

## 2023-05-04 DIAGNOSIS — Z7985 Long-term (current) use of injectable non-insulin antidiabetic drugs: Secondary | ICD-10-CM

## 2023-05-04 DIAGNOSIS — E1122 Type 2 diabetes mellitus with diabetic chronic kidney disease: Secondary | ICD-10-CM

## 2023-05-04 DIAGNOSIS — E119 Type 2 diabetes mellitus without complications: Secondary | ICD-10-CM

## 2023-05-04 DIAGNOSIS — Z794 Long term (current) use of insulin: Secondary | ICD-10-CM

## 2023-05-04 DIAGNOSIS — Z7984 Long term (current) use of oral hypoglycemic drugs: Secondary | ICD-10-CM | POA: Diagnosis not present

## 2023-05-04 DIAGNOSIS — E042 Nontoxic multinodular goiter: Secondary | ICD-10-CM

## 2023-05-04 NOTE — Progress Notes (Unsigned)
00. Patient ID: Bridget Butler, female   DOB: 17-Mar-1946, 77 y.o.   MRN: [id number]   HPI  Bridget Butler is a 77 y.o.-year-old female, initially referred by her PCP, Dr. Clelia Croft, returning for follow-up for toxic thyroid adenomas with subclinical thyrotoxicosis and now also uncontrolled DM2, insulin-dependent, with complications (CKD, DR, PN).  Last visit 4 months ago.  Interim history: No increased urination, nausea, chest pain.  She had increased urination after starting skipping metformin. She improved her diet since last OV.  He is starting to reduce processed foods and carbs.  Toxic thyroid nodules:  Reviewed history: She was diagnosed with hyperthyroidism approximately 2010. She was seeing Dr. Margaretmary Bayley - he retired. She was started on medication (cannot remember name) >> felt "off" >> stopped in ~2010.  I recommended to add methimazole 2.5 mg in 08/2022 but taking it inconsistently. In 01/2023, I advised her to take 5 mg every other day consistently. She took 2.5 mg daily as she had them already cut, but ran out sometime ago, cannot remember how long...  Reviewed her TFTs: Lab Results  Component Value Date   TSH 0.31 (L) 02/12/2023   TSH 0.23 (L) 12/30/2022   TSH 0.20 (L) 09/01/2022   TSH 0.36 12/31/2021   TSH 0.38 01/14/2021   TSH 0.57 07/18/2020   TSH 0.148 (L) 06/04/2020   TSH 0.03 (L) 04/11/2019   TSH 0.03 (L) 10/29/2018   TSH 0.054 (L) 07/08/2018   FREET4 0.72 02/12/2023   FREET4 0.90 09/01/2022   FREET4 0.90 01/14/2021   FREET4 0.66 07/18/2020   FREET4 0.93 06/08/2020   FREET4 0.84 04/11/2019   FREET4 0.97 10/29/2018   FREET4 1.05 07/08/2018   T3FREE 3.0 02/12/2023   T3FREE 3.3 09/01/2022   T3FREE 3.0 01/14/2021   T3FREE 3.1 07/18/2020   T3FREE 3.0 04/11/2019   T3FREE 3.8 10/29/2018   T3FREE 3.3 07/08/2018   Her TSI antibodies were not elevated: Lab Results  Component Value Date   TSI <89 10/29/2018    Thyroid uptake (08/28/2007):  There is  heterogeneous uptake within a normal sized gland. There are regions of photopenia within the bilateral upper poles and in the left lower pole. The most intense uptake is in the mid pole of the right lobe of the thyroid gland. Decreased uptake in the inferior pole of the right lobe of the thyroid gland additionally.   Calculated 24-hour I-131 uptake is equal to 25.0%, which is normal (normal 10-30%).   IMPRESSION:   1. Heterogeneous uptake within the thyroid gland could represent resolving subacute thyroiditis in patient with a normal uptake.  2. Cannot exclude a multinodular goiter if TSH continues to remain depressed  We obtained a thyroid uptake and scan (11/30/2018): Normal uptake but multinodular goiter with warm nodules at inferior thyroid poles:  4 hour I-123 uptake = 8.6% (normal 5-20%) 24 hour I-123 uptake = 25.3% (normal 10-30%)   IMPRESSION: Normal 4 hour and 24 hour radio iodine uptakes.   Multinodular thyroid gland with warm nodules at the inferior poles of both thyroid lobes, corresponding to dominant masses identified on a remote thyroid ultrasound consistent with adenomas.  No FH of thyroid disease or thyroid cancer. No h/o radiation tx to head or neck. No herbal supplements. No Biotin use.   Multinodular goiter per ultrasound from 06/28/2013.   Biopsies were benign on 07/13/2013.  Pt denies: - feeling nodules in neck - hoarseness - dysphagia - choking  DM2: -Diagnosed: 9092 - insulin started: ~2006  Reviewed history: Her diabetes became uncontrolled after her first laryngeal surgery in 03/2020.  She was on steroids at the time of her surgery.  Reviewed HbA1c levels: Lab Results  Component Value Date   HGBA1C 8.2 (H) 12/30/2022   HGBA1C 7.7 (A) 09/01/2022   HGBA1C 7.2 (A) 05/01/2022   HGBA1C 7.7 (H) 12/31/2021   HGBA1C 7.5 (A) 09/09/2021   HGBA1C 7.7 (H) 05/01/2021   HGBA1C 7.5 (A) 01/14/2021   HGBA1C 7.7 (A) 10/15/2020   HGBA1C 9.1 (A) 08/23/2020   HGBA1C  10.4 (H) 07/11/2020   She is on: - Janumet 50-1000 mg at bedtime >> Metformin ER 1000 mg at bedtime >> 500 mg ~every other day - Prandin 1-2 mg before dinner - added 08/2022 >> not taking - Trulicity 0.75 >> 1.5 >> 3 x1 mo  -gets these through patient assistance  - Basaglar 16 >> 14 >> 16 >> 14-16 >> 16 >> 18 units in a.m.  She had GI intolerance with regular Metformin. Tried Farxiga and Jardiance >> $$$.  Pt checks her sugars once a day: - am:  90s-109, 200 >> 98, 112-161, 183, 202 >> 94-148 >> 102-161 - 2h after b'fast: n/c - before lunch: n/c >> 154 >> n/c - 2h after lunch: 154, 159 >> 83-216, 229 >> n/c >> 154 >> n/c - before dinner: n/c >> 211>> n/c - 2h after dinner: n/c - bedtime: 252 >> 133-229, 237 >> n/c >> 128-282, 305 >> n/c - nighttime: n/c Lowest sugar was 63 >> 69 >> 96 >> 90 >> 98 >> 94.>> 102; she has hypoglycemia awareness at 70.  Highest sugar was 400s >> .[name] KitchenMarland Kitchen252 (no meds) >> 237 >> 200 (ate late) >> 305 >> 186 >> 161.  Glucometer: True Metrix  Pt's meals are: - Breakfast: eggs, [place] sausage or oatmeal >> coffee  - Lunch: salad + eggs + [place] meat (no pork) >> sandwich - Dinner: veggies + chicken/turkey and fish - Snacks: not after sx  -+ Mild CKD, last BUN/creatinine:  Lab Results  Component Value Date   BUN 13 02/18/2023   BUN 18 12/30/2022   CREATININE 1.10 02/18/2023   CREATININE 1.01 12/30/2022  On losartan 50  -+ HL; last set of lipids: Lab Results  Component Value Date   CHOL 173 09/01/2022   HDL 58.20 09/01/2022   LDLCALC 80 09/01/2022   TRIG 173.0 (H) 09/01/2022   CHOLHDL 3 09/01/2022  On pravastatin 40.  - last eye exam was on 2023: + DR, stable reportedly. Sees retina specialist. Dr. Stephannie Li.  -+ Improved numbness and tingling in her feet.  Last foot exam 12/30/2022.  On ASA 81  Pt has FH of DM in mother.  She also has a history of GERD, osteopenia, microlaryngoscopy for subglottic stenosis 06 and 06/2020.  ROS:  + see  HPI  I reviewed pt's medications, allergies, PMH, social hx, family hx, and changes were documented in the history of present illness. Otherwise, unchanged from my initial visit note.  Past Medical History:  Diagnosis Date   Cataract    Diabetes mellitus without complication (HCC)    Dyspnea    on exertion   GERD (gastroesophageal reflux disease)    Hyperlipidemia    Hypertension    Multiple thyroid nodules    Thyroid disease    Past Surgical History:  Procedure Laterality Date   CHOLECYSTECTOMY     COSMETIC SURGERY Bilateral    eyelids   EYE SURGERY     GALLBLADDER SURGERY  MICROLARYNGOSCOPY WITH CO2 LASER AND EXCISION OF VOCAL CORD LESION N/A 04/02/2020   Procedure: MICROLARYNGOSCOPY WITH DILATION AND CO2 LASER AND EXCISION OF VOCAL CORD LESION w/MITOMYCIN C;  Surgeon: Christia Reading, MD;  Location: [facility] OR;  Service: ENT;  Laterality: N/A;   MICROLARYNGOSCOPY WITH DILATION N/A 05/23/2020   Procedure: MICROLARYNGOSCOPY W/ DILATION AND JET VENTILATION;  Surgeon: Christia Reading, MD;  Location: [facility] OR;  Service: ENT;  Laterality: N/A;   MICROLARYNGOSCOPY WITH DILATION N/A 07/13/2020   Procedure: MICROLARYNGOSCOPY with Jet Ventilation;  Surgeon: Christia Reading, MD;  Location: [facility] OR;  Service: ENT;  Laterality: N/A;   TRACHEOSTOMY TUBE PLACEMENT N/A 04/02/2020   Procedure: Awake TRACHEOSTOMY;  Surgeon: Christia Reading, MD;  Location: [facility] OR;  Service: ENT;  Laterality: N/A;   TUBAL LIGATION     Social History   Socioeconomic History   Marital status: Widowed    Spouse name: Not on file   Number of children: 3   Years of education: 64   Highest education level: Not on file  Occupational History   Not on file  Tobacco Use   Smoking status: Never   Smokeless tobacco: Never  Vaping Use   Vaping status: Never Used  Substance and Sexual Activity   Alcohol use: No   Drug use: No   Sexual activity: Not Currently    Partners: Male  Other Topics Concern   Not on file  Social History  Narrative   Mrs Snowball is a 77 year old Jehovah witness patient    She receives support from her daughter primarily who also assists with transportation to medical appointments.    She is presently independent with her ADLs and iADLs      Social Determinants of Health   Financial Resource Strain: Low Risk  (09/11/2021)   Overall Financial Resource Strain (CARDIA)    Difficulty of Paying Living Expenses: Not hard at all  Food Insecurity: No Food Insecurity (09/15/2022)   Hunger Vital Sign    Worried About Running Out of Food in the Last Year: Never true    Ran Out of Food in the Last Year: Never true  Transportation Needs: No Transportation Needs (09/15/2022)   PRAPARE - Administrator, Civil Service (Medical): No    Lack of Transportation (Non-Medical): No  Physical Activity: Sufficiently Active (09/15/2022)   Exercise Vital Sign    Days of Exercise per Week: 7 days    Minutes of Exercise per Session: 30 min  Stress: No Stress Concern Present (09/15/2022)   [employer] of Occupational Health - Occupational Stress Questionnaire    Feeling of Stress : Not at all  Social Connections: Moderately Integrated (09/15/2022)   Social Connection and Isolation Panel [NHANES]    Frequency of Communication with Friends and Family: More than three times a week    Frequency of Social Gatherings with Friends and Family: More than three times a week    Attends Religious Services: More than 4 times per year    Active Member of [employer] or Organizations: Yes    Attends Banker Meetings: More than 4 times per year    Marital Status: Widowed  Intimate Partner Violence: Not At Risk (09/15/2022)   Humiliation, Afraid, Rape, and Kick questionnaire    Fear of Current or Ex-Partner: No    Emotionally Abused: No    Physically Abused: No    Sexually Abused: No   Current Outpatient Medications on File Prior to Visit  Medication Sig Dispense  Refill   amLODipine (NORVASC) 5 MG  tablet Take 1 tablet (5 mg total) by mouth daily. 90 tablet 0   Blood Glucose Monitoring Suppl (TRUE METRIX METER) DEVI 1 Device by Does not apply route 2 (two) times daily. 1 each 2   Cholecalciferol 50 MCG (2000 UT) TABS Take 1 tablet (2,000 Units total) by mouth daily. 90 tablet 1   Dulaglutide (TRULICITY) 3 MG/0.5ML SOPN Inject 3 mg into the skin once a week. 6 mL 3   empagliflozin (JARDIANCE) 10 MG TABS tablet Take 1 tablet (10 mg total) by mouth daily before breakfast. 90 tablet 0   glucose blood (TRUE METRIX BLOOD GLUCOSE TEST) test strip 1 each by Other route 2 (two) times daily. USE AS INSTRUCTED TO CHECK BLOOD SUGAR ONE TIME DAILY 100 strip 4   Insulin Glargine (BASAGLAR KWIKPEN) 100 UNIT/ML DIAL AND INJECT 16 UNITS UNDER THE SKIN DAILY. MAX DAILY DOSE IS 16 UNITS. 15 mL 0   Insulin Pen Needle (PEN NEEDLES) 32G X 5 MM MISC 1 Units by Does not apply route daily. 100 each 5   methimazole (TAPAZOLE) 5 MG tablet Take 0.5 tablets (2.5 mg total) by mouth daily. 30 tablet 5   Multiple Vitamin (MULTIVITAMIN WITH MINERALS) TABS tablet Take 1 tablet by mouth daily.     olmesartan (BENICAR) 20 MG tablet Take 1 tablet (20 mg total) by mouth daily. 90 tablet 0   omeprazole (PRILOSEC) 40 MG capsule TAKE 1 CAPSULE BY MOUTH TWICE DAILY --  IN  THE  MORNING  AND  AT  BEDTIME 180 capsule 0   potassium chloride (KLOR-CON 10) 10 MEQ tablet Take 1 tablet (10 mEq total) by mouth 2 (two) times daily. 180 tablet 0   pravastatin (PRAVACHOL) 40 MG tablet Take 1 tablet (40 mg total) by mouth at bedtime. 90 tablet 0   repaglinide (PRANDIN) 1 MG tablet Take 1 tablet (1 mg total) by mouth daily before supper. 90 tablet 3   Tetrahydrozoline HCl (VISINE OP) Apply 1 drop to eye daily as needed (dry eyes).     No current facility-administered medications on file prior to visit.   Allergies  Allergen Reactions   Metformin And Related Diarrhea   Family History  Problem Relation Age of Onset   Heart disease Mother     Hypertension Mother    Cancer Sister    Diabetes Sister    PE: BP 138/70   Pulse 93   Ht 5\' 3"  (1.6 m)   Wt 190 lb (86.2 kg)   SpO2 96%   BMI 33.66 kg/m   Wt Readings from Last 3 Encounters:  05/04/23 190 lb (86.2 kg)  02/18/23 186 lb (84.4 kg)  01/01/23 189 lb (85.7 kg)   Constitutional: overweight, in NAD Eyes: EOMI, no exophthalmos ENT: no thyromegaly, no thyroid masses palpated, no cervical lymphadenopathy Cardiovascular: No tachycardia at the time of the exam , RR, No MRG, +B LE edema Respiratory: CTA B Musculoskeletal: no deformities Skin: no rashes Neurological: no tremor with outstretched hands  ASSESSMENT: 1. DM2-insulin dependent, uncontrolled, with complications - CKD - DR - PN  2. Thyrotoxic adenomas  3. Multiple thyroid nodules  PLAN:  1. DM2 -Patient with longstanding type 2 diabetes, on metformin ER, weekly GLP-1 receptor agonist, and long-acting insulin, with still poor control.  At last visit, HbA1c was higher, at 8.2%.  Sugars were variable in the morning, mostly above 140s, but it was difficult to obtain this information from her meter at the  time and date were off.  We corrected it at that time.  We did not change her regimen but advised her to take the medicines consistently. -At today's visit, she is only checking sugars in the morning and they are at or above target.  Upon questioning, she is using metformin inconsistently due to GI symptoms.  We discussed that if she is not bothered by a lower dose of metformin, she can take 1 tablet with dinner.  I feel that this can improve her morning sugars further.  If she cannot tolerate this, she can start taking the repaglinide, which I recommended at last visit but she did not take.  Will continue Basaglar and Trulicity for now. -I congratulated her for the improvement in her diet. - I advised the pt to: Patient Instructions  Please take: - Metformin ER 500 mg with dinner  If this bothers your stomach,  can use:  - Repaglinide 1-2 mg before dinner  Continue: - Basaglar 18 units in am - Trulicity 3 mg weekly   Please continue off Methimazole for now.  Please stop at the lab.  Please return in 3-4 months with your sugar log.   - we checked her HbA1c: 6.9% (lower) - advised to check sugars at different times of the day - 1x a day, rotating check times - advised for yearly eye exams >> she is not UTD, but coming up - return to clinic in 3-4 months  2.  Patient with a long history of low TSH without thyrotoxic symptoms: No weight loss, heat intolerance, hyperdefecation, palpitations, anxiety -Thyroid uptake and scan showed toxic adenomas in the inferior thyroid lobes -We did not have to intervene in the past since she only had a mildly suppressed TSH with normal free T4 and free T3.  In 03/2019, TSH was quite low, 0.03 and we discussed about RAI treatment, however TFTs improved afterwards. -In 2023, as the TSH was still suppressed, I suggested to add a low-dose methimazole, 2.5 mg daily.  She did not return for labs afterwards.  TSH obtained 2 days prior to our last appointment was still suppressed: Lab Results  Component Value Date   TSH 0.31 (L) 02/12/2023  -Last visit she was telling me that she was taking methimazole very inconsistently.  We discussed about the importance of taking it as prescribed.  I advised her to take the 5 mg tablet every other day, as she did not want to cut the tablet in half.  However, at today's visit she mentioned that he already cut them in half and took 2.5 mg daily  but ran out several weeks ago... -No thyroid signs or symptoms  -Will recheck her TFTs now  3. Multiple thyroid nodules -No neck compression symptoms -Due to the nodules were biopsied with benign results in 2014 -Reviewing the thyroid uptake and scan report from 2020, she had more nodules in the lower poles of the thyroid, but without indication for biopsy -We discussed in the past that RAI  treatment may help taking the nodules, but she did not decide for this - We are managing her conservatively  Carlus Pavlov, MD PhD [facility] Endocrinology

## 2023-05-04 NOTE — Patient Instructions (Addendum)
Please take: - Metformin ER 500 mg with dinner  If this bothers your stomach, can use:  - Repaglinide 1-2 mg before dinner  Continue: - Basaglar 18 units in am - Trulicity 3 mg weekly   Please continue off Methimazole for now.  Please stop at the lab.  Please return in 3-4 months with your sugar log.

## 2023-05-05 LAB — TSH: TSH: 0.2 u[IU]/mL — ABNORMAL LOW (ref 0.35–5.50)

## 2023-05-05 LAB — T3, FREE: T3, Free: 3.5 pg/mL (ref 2.3–4.2)

## 2023-05-05 LAB — T4, FREE: Free T4: 0.72 ng/dL (ref 0.60–1.60)

## 2023-05-05 MED ORDER — METHIMAZOLE 5 MG PO TABS: 5.0000 mg | ORAL_TABLET | Freq: Every day | ORAL | 3 refills | Status: DC

## 2023-06-23 ENCOUNTER — Telehealth: Payer: Self-pay | Admitting: Internal Medicine

## 2023-06-23 ENCOUNTER — Other Ambulatory Visit: Payer: Self-pay | Admitting: Internal Medicine

## 2023-06-23 DIAGNOSIS — E1159 Type 2 diabetes mellitus with other circulatory complications: Secondary | ICD-10-CM

## 2023-06-23 DIAGNOSIS — E876 Hypokalemia: Secondary | ICD-10-CM

## 2023-06-23 DIAGNOSIS — E785 Hyperlipidemia, unspecified: Secondary | ICD-10-CM

## 2023-06-23 MED ORDER — PRAVASTATIN SODIUM 40 MG PO TABS: 40.0000 mg | ORAL_TABLET | Freq: Every day | ORAL | 0 refills | Status: DC

## 2023-06-23 MED ORDER — POTASSIUM CHLORIDE ER 10 MEQ PO TBCR: 10.0000 meq | EXTENDED_RELEASE_TABLET | Freq: Two times a day (BID) | ORAL | 0 refills | Status: DC

## 2023-06-23 NOTE — Telephone Encounter (Signed)
Prescription Request  06/23/2023  LOV: 02/18/2023  What is the name of the medication or equipment? Klor - 10 mg. , Prevastatin 40  mg.  Have you contacted your pharmacy to request a refill? Yes   Which pharmacy would you like this sent to?  [facility] [address] ([address] [place] [address] [place]) [place] [address] Phone: [phone] Fax: [phone]   Patient notified that their request is being sent to the clinical staff for review and that they should receive a response within 2 business days.   Please advise at Mobile [phone] (mobile)

## 2023-06-24 DIAGNOSIS — E113593 Type 2 diabetes mellitus with proliferative diabetic retinopathy without macular edema, bilateral: Secondary | ICD-10-CM | POA: Diagnosis not present

## 2023-06-24 DIAGNOSIS — H35342 Macular cyst, hole, or pseudohole, left eye: Secondary | ICD-10-CM | POA: Diagnosis not present

## 2023-06-24 DIAGNOSIS — H43811 Vitreous degeneration, right eye: Secondary | ICD-10-CM | POA: Diagnosis not present

## 2023-07-01 ENCOUNTER — Ambulatory Visit (INDEPENDENT_AMBULATORY_CARE_PROVIDER_SITE_OTHER): Payer: Medicare PPO | Admitting: Internal Medicine

## 2023-07-01 ENCOUNTER — Encounter: Payer: Self-pay | Admitting: Internal Medicine

## 2023-07-01 ENCOUNTER — Other Ambulatory Visit: Payer: Self-pay | Admitting: Internal Medicine

## 2023-07-01 VITALS — BP 130/84 | HR 75 | Temp 98.7°F | Ht 63.0 in | Wt 191.0 lb

## 2023-07-01 DIAGNOSIS — E119 Type 2 diabetes mellitus without complications: Secondary | ICD-10-CM

## 2023-07-01 DIAGNOSIS — E1159 Type 2 diabetes mellitus with other circulatory complications: Secondary | ICD-10-CM | POA: Diagnosis not present

## 2023-07-01 DIAGNOSIS — Z794 Long term (current) use of insulin: Secondary | ICD-10-CM

## 2023-07-01 DIAGNOSIS — R42 Dizziness and giddiness: Secondary | ICD-10-CM

## 2023-07-01 DIAGNOSIS — N1831 Chronic kidney disease, stage 3a: Secondary | ICD-10-CM

## 2023-07-01 DIAGNOSIS — E559 Vitamin D deficiency, unspecified: Secondary | ICD-10-CM | POA: Diagnosis not present

## 2023-07-01 DIAGNOSIS — I152 Hypertension secondary to endocrine disorders: Secondary | ICD-10-CM

## 2023-07-01 MED ORDER — AMLODIPINE BESYLATE 5 MG PO TABS
5.0000 mg | ORAL_TABLET | Freq: Every day | ORAL | 1 refills | Status: DC
Start: 2023-07-01 — End: 2023-07-01

## 2023-07-01 MED ORDER — OLMESARTAN MEDOXOMIL 20 MG PO TABS: 20.0000 mg | ORAL_TABLET | Freq: Every day | ORAL | 1 refills | Status: DC

## 2023-07-01 NOTE — Progress Notes (Signed)
Patient ID: Bridget Butler, female   DOB: 03-11-1946, 77 y.o.   MRN: [id number]        Chief Complaint: follow up vertigo, htn, dm, ckd3a, low vi td       HPI:  Bridget Butler is a 77 y.o. female here with c/o positional veritgo with head movement such as sitting up or turning over in bed, worse mild intermittent for 1-2 wks,  without HA, ear pain or sinus congestion.  Pt denies chest pain, increased sob or doe, wheezing, orthopnea, PND, increased LE swelling, palpitations, dizziness or syncope.   Pt denies polydipsia, polyuria, or new focal neuro s/s.    Pt denies fever, wt loss, night sweats, loss of appetite, or other constitutional symptoms        Wt Readings from Last 3 Encounters:  07/01/23 191 lb (86.6 kg)  05/04/23 190 lb (86.2 kg)  02/18/23 186 lb (84.4 kg)   BP Readings from Last 3 Encounters:  07/01/23 130/84  05/04/23 138/70  02/18/23 136/62         Past Medical History:  Diagnosis Date   Cataract    Diabetes mellitus without complication (HCC)    Dyspnea    on exertion   GERD (gastroesophageal reflux disease)    Hyperlipidemia    Hypertension    Multiple thyroid nodules    Thyroid disease    Past Surgical History:  Procedure Laterality Date   CHOLECYSTECTOMY     COSMETIC SURGERY Bilateral    eyelids   EYE SURGERY     GALLBLADDER SURGERY     MICROLARYNGOSCOPY WITH CO2 LASER AND EXCISION OF VOCAL CORD LESION N/A 04/02/2020   Procedure: MICROLARYNGOSCOPY WITH DILATION AND CO2 LASER AND EXCISION OF VOCAL CORD LESION w/MITOMYCIN C;  Surgeon: Christia Reading, MD;  Location: [facility] OR;  Service: ENT;  Laterality: N/A;   MICROLARYNGOSCOPY WITH DILATION N/A 05/23/2020   Procedure: MICROLARYNGOSCOPY W/ DILATION AND JET VENTILATION;  Surgeon: Christia Reading, MD;  Location: [facility] OR;  Service: ENT;  Laterality: N/A;   MICROLARYNGOSCOPY WITH DILATION N/A 07/13/2020   Procedure: MICROLARYNGOSCOPY with Jet Ventilation;  Surgeon: Christia Reading, MD;  Location[facility] OR;  Service: ENT;  Laterality:  N/A;   TRACHEOSTOMY TUBE PLACEMENT N/A 04/02/2020   Procedure: Awake TRACHEOSTOMY;  Surgeon: Christia Reading, MD;  Location: [facility] OR;  Service: ENT;  Laterality: N/A;   TUBAL LIGATION      reports that she has never smoked. She has never used smokeless tobacco. She reports that she does not drink alcohol and does not use drugs. family history includes Cancer in her sister; Diabetes in her sister; Heart disease in her mother; Hypertension in her mother. Allergies  Allergen Reactions   Metformin And Related Diarrhea   Current Outpatient Medications on File Prior to Visit  Medication Sig Dispense Refill   Blood Glucose Monitoring Suppl (TRUE METRIX METER) DEVI 1 Device by Does not apply route 2 (two) times daily. 1 each 2   Cholecalciferol 50 MCG (2000 UT) TABS Take 1 tablet (2,000 Units total) by mouth daily. 90 tablet 1   Dulaglutide (TRULICITY) 3 MG/0.5ML SOPN Inject 3 mg into the skin once a week. 6 mL 3   glucose blood (TRUE METRIX BLOOD GLUCOSE TEST) test strip 1 each by Other route 2 (two) times daily. USE AS INSTRUCTED TO CHECK BLOOD SUGAR ONE TIME DAILY 100 strip 4   Insulin Glargine (BASAGLAR KWIKPEN) 100 UNIT/ML DIAL AND INJECT 16 UNITS UNDER THE SKIN DAILY. MAX DAILY DOSE  IS 16 UNITS. 15 mL 0   Insulin Pen Needle (PEN NEEDLES) 32G X 5 MM MISC 1 Units by Does not apply route daily. 100 each 5   methimazole (TAPAZOLE) 5 MG tablet Take 1 tablet (5 mg total) by mouth daily. 45 tablet 3   Multiple Vitamin (MULTIVITAMIN WITH MINERALS) TABS tablet Take 1 tablet by mouth daily.     omeprazole (PRILOSEC) 40 MG capsule TAKE 1 CAPSULE BY MOUTH TWICE DAILY --  IN  THE  MORNING  AND  AT  BEDTIME 180 capsule 0   potassium chloride (KLOR-CON 10) 10 MEQ tablet Take 1 tablet (10 mEq total) by mouth 2 (two) times daily. 180 tablet 0   pravastatin (PRAVACHOL) 40 MG tablet Take 1 tablet (40 mg total) by mouth at bedtime. 90 tablet 0   repaglinide (PRANDIN) 1 MG tablet Take 1 tablet (1 mg total) by mouth  daily before supper. 90 tablet 3   Tetrahydrozoline HCl (VISINE OP) Apply 1 drop to eye daily as needed (dry eyes).     No current facility-administered medications on file prior to visit.        ROS:  All others reviewed and negative.  Objective        PE:  BP 130/84 (BP Location: Left Arm, Patient Position: Sitting, Cuff Size: Normal)   Pulse 75   Temp 98.7 F (37.1 C) (Oral)   Ht 5\' 3"  (1.6 m)   Wt 191 lb (86.6 kg)   SpO2 100%   BMI 33.83 kg/m                 Constitutional: Pt appears in NAD               HENT: Head: NCAT.                Right Ear: External ear normal.                 Left Ear: External ear normal. Bilat Tms wihtout erythema               Eyes: . Pupils are equal, round, and reactive to light. Conjunctivae and EOM are normal               Nose: without d/c or deformity               Neck: Neck supple. Gross normal ROM               Cardiovascular: Normal rate and regular rhythm.                 Pulmonary/Chest: Effort normal and breath sounds without rales or wheezing.                Abd:  Soft, NT, ND, + BS, no organomegaly               Neurological: Pt is alert. At baseline orientation, motor grossly intact               Skin: Skin is warm. No rashes, no other new lesions, LE edema - none               Psychiatric: Pt behavior is normal without agitation   Micro: none  Cardiac tracings I have personally interpreted today:  none  Pertinent Radiological findings (summarize): none   Lab Results  Component Value Date   WBC 4.6 02/18/2023   HGB 11.1 (L) 02/18/2023   HCT 33.1 (L)  02/18/2023   PLT 313.0 02/18/2023   GLUCOSE 128 (H) 02/18/2023   CHOL 173 09/01/2022   TRIG 173.0 (H) 09/01/2022   HDL 58.20 09/01/2022   LDLCALC 80 09/01/2022   ALT 18 05/01/2021   AST 19 05/01/2021   NA 138 02/18/2023   K 4.6 02/18/2023   CL 103 02/18/2023   CREATININE 1.10 02/18/2023   BUN 13 02/18/2023   CO2 28 02/18/2023   TSH 0.20 (L) 05/04/2023   HGBA1C 8.2  (H) 12/30/2022   MICROALBUR 20.6 (H) 12/30/2022   Assessment/Plan:  Laelynn Deschaine Bottoms is a 77 y.o. Black or African American [2] female with  has a past medical history of Cataract, Diabetes mellitus without complication (HCC), Dyspnea, GERD (gastroesophageal reflux disease), Hyperlipidemia, Hypertension, Multiple thyroid nodules, and Thyroid disease.  Hypertension associated with diabetes (HCC) BP Readings from Last 3 Encounters:  07/01/23 130/84  05/04/23 138/70  02/18/23 136/62   Stable, pt to continue medical treatment norvasc 5 every day, benicar 20 qd   Insulin-requiring or dependent type II diabetes mellitus (HCC) Lab Results  Component Value Date   HGBA1C 8.2 (H) 12/30/2022   Mild recent uncontrolled, declines f/u A1c today, cont trulicity 3 mg weekly, basaglar 16 u every day, prandin 1 mg with supper qd   Stage 3a chronic kidney disease (HCC) Lab Results  Component Value Date   CREATININE 1.10 02/18/2023   Stable overall, cont to avoid nephrotoxins   Vertigo Mild intermittent, c/w BPPV - for meclizine prn, refer pt - neurorehab per pt reqeust  Vitamin D deficiency Last vitamin D Lab Results  Component Value Date   VD25OH 37.63 05/01/2021   Low, to start oral replacement  Followup: Return if symptoms worsen or fail to improve.  Oliver Barre, MD 07/04/2023 9:57 AM [facility]

## 2023-07-01 NOTE — Patient Instructions (Signed)
Please take all new medication as prescribed - the meclizine as needed for the vertigo  You can also try OTC dramamine as needed in the future as well if needed  Please continue all other medications as before, and refills have been done if requested.  Please have the pharmacy call with any other refills you may need  Please keep your appointments with your specialists as you may have planned  You will be contacted regarding the referral for: Neurorehab physical therapy

## 2023-07-02 ENCOUNTER — Telehealth: Payer: Self-pay | Admitting: Internal Medicine

## 2023-07-02 MED ORDER — MECLIZINE HCL 12.5 MG PO TABS: 12.5000 mg | ORAL_TABLET | Freq: Three times a day (TID) | ORAL | 1 refills | Status: DC | PRN

## 2023-07-02 NOTE — Telephone Encounter (Signed)
Patient was seen by Dr. Jonny Ruiz yesterday.  He was supposed to have sent in a prescription for vertigo but nothing was sent to her pharmacy.  Please send script to [facility] on [address].

## 2023-07-02 NOTE — Telephone Encounter (Signed)
Ok this has been done thanks

## 2023-07-04 DIAGNOSIS — R42 Dizziness and giddiness: Secondary | ICD-10-CM | POA: Insufficient documentation

## 2023-07-04 NOTE — Assessment & Plan Note (Signed)
BP Readings from Last 3 Encounters:  07/01/23 130/84  05/04/23 138/70  02/18/23 136/62   Stable, pt to continue medical treatment norvasc 5 every day, benicar 20 qd

## 2023-07-04 NOTE — Assessment & Plan Note (Signed)
Last vitamin D Lab Results  Component Value Date   VD25OH 37.63 05/01/2021   Low, to start oral replacement

## 2023-07-04 NOTE — Assessment & Plan Note (Addendum)
Mild intermittent, c/w BPPV - for meclizine prn, refer pt - neurorehab per pt reqeust

## 2023-07-04 NOTE — Assessment & Plan Note (Signed)
Lab Results  Component Value Date   HGBA1C 8.2 (H) 12/30/2022   Mild recent uncontrolled, declines f/u A1c today, cont trulicity 3 mg weekly, basaglar 16 u every day, prandin 1 mg with supper qd

## 2023-07-04 NOTE — Assessment & Plan Note (Signed)
Lab Results  Component Value Date   CREATININE 1.10 02/18/2023   Stable overall, cont to avoid nephrotoxins

## 2023-07-14 ENCOUNTER — Ambulatory Visit: Payer: Medicare PPO | Admitting: Physical Therapy

## 2023-07-28 ENCOUNTER — Ambulatory Visit (INDEPENDENT_AMBULATORY_CARE_PROVIDER_SITE_OTHER): Payer: Medicare PPO | Admitting: Internal Medicine

## 2023-07-28 ENCOUNTER — Encounter: Payer: Self-pay | Admitting: Internal Medicine

## 2023-07-28 VITALS — BP 132/58 | HR 96 | Temp 98.3°F | Resp 16 | Ht 63.0 in | Wt 188.0 lb

## 2023-07-28 DIAGNOSIS — E119 Type 2 diabetes mellitus without complications: Secondary | ICD-10-CM

## 2023-07-28 DIAGNOSIS — N1832 Chronic kidney disease, stage 3b: Secondary | ICD-10-CM

## 2023-07-28 DIAGNOSIS — D638 Anemia in other chronic diseases classified elsewhere: Secondary | ICD-10-CM | POA: Diagnosis not present

## 2023-07-28 DIAGNOSIS — Z0001 Encounter for general adult medical examination with abnormal findings: Secondary | ICD-10-CM

## 2023-07-28 DIAGNOSIS — I10 Essential (primary) hypertension: Secondary | ICD-10-CM

## 2023-07-28 DIAGNOSIS — R7989 Other specified abnormal findings of blood chemistry: Secondary | ICD-10-CM | POA: Diagnosis not present

## 2023-07-28 DIAGNOSIS — E1122 Type 2 diabetes mellitus with diabetic chronic kidney disease: Secondary | ICD-10-CM

## 2023-07-28 DIAGNOSIS — N1831 Chronic kidney disease, stage 3a: Secondary | ICD-10-CM

## 2023-07-28 DIAGNOSIS — Z794 Long term (current) use of insulin: Secondary | ICD-10-CM

## 2023-07-28 DIAGNOSIS — Z0000 Encounter for general adult medical examination without abnormal findings: Secondary | ICD-10-CM

## 2023-07-28 DIAGNOSIS — E785 Hyperlipidemia, unspecified: Secondary | ICD-10-CM | POA: Diagnosis not present

## 2023-07-28 LAB — CBC WITH DIFFERENTIAL/PLATELET
Basophils Absolute: 0 10*3/uL (ref 0.0–0.1)
Basophils Relative: 0.7 % (ref 0.0–3.0)
Eosinophils Absolute: 0.1 10*3/uL (ref 0.0–0.7)
Eosinophils Relative: 1.1 % (ref 0.0–5.0)
HCT: 34.5 % — ABNORMAL LOW (ref 36.0–46.0)
Hemoglobin: 11.1 g/dL — ABNORMAL LOW (ref 12.0–15.0)
Lymphocytes Relative: 40.3 % (ref 12.0–46.0)
Lymphs Abs: 2.6 10*3/uL (ref 0.7–4.0)
MCHC: 32.2 g/dL (ref 30.0–36.0)
MCV: 97.6 fL (ref 78.0–100.0)
Monocytes Absolute: 0.4 10*3/uL (ref 0.1–1.0)
Monocytes Relative: 6.1 % (ref 3.0–12.0)
Neutro Abs: 3.4 10*3/uL (ref 1.4–7.7)
Neutrophils Relative %: 51.8 % (ref 43.0–77.0)
Platelets: 291 10*3/uL (ref 150.0–400.0)
RBC: 3.53 Mil/uL — ABNORMAL LOW (ref 3.87–5.11)
RDW: 13.4 % (ref 11.5–15.5)
WBC: 6.5 10*3/uL (ref 4.0–10.5)

## 2023-07-28 LAB — BASIC METABOLIC PANEL
BUN: 13 mg/dL (ref 6–23)
CO2: 29 meq/L (ref 19–32)
Calcium: 9.8 mg/dL (ref 8.4–10.5)
Chloride: 99 meq/L (ref 96–112)
Creatinine, Ser: 1.14 mg/dL (ref 0.40–1.20)
GFR: 46.52 mL/min — ABNORMAL LOW (ref >60.00)
Glucose, Bld: 322 mg/dL — ABNORMAL HIGH (ref 70–99)
Potassium: 4.3 meq/L (ref 3.5–5.1)
Sodium: 134 meq/L — ABNORMAL LOW (ref 135–145)

## 2023-07-28 LAB — HEPATIC FUNCTION PANEL
ALT: 20 U/L (ref 0–35)
AST: 17 U/L (ref 0–37)
Albumin: 4.1 g/dL (ref 3.5–5.2)
Alkaline Phosphatase: 92 U/L (ref 39–117)
Bilirubin, Direct: 0 mg/dL (ref 0.0–0.3)
Total Bilirubin: 0.8 mg/dL (ref 0.2–1.2)
Total Protein: 7 g/dL (ref 6.0–8.3)

## 2023-07-28 LAB — LIPID PANEL
Cholesterol: 207 mg/dL — ABNORMAL HIGH (ref 0–200)
HDL: 72.3 mg/dL (ref >39.00)
LDL Cholesterol: 99 mg/dL (ref 0–99)
NonHDL: 134.97
Total CHOL/HDL Ratio: 3
Triglycerides: 178 mg/dL — ABNORMAL HIGH (ref 0.0–149.0)
VLDL: 35.6 mg/dL (ref 0.0–40.0)

## 2023-07-28 LAB — HEMOGLOBIN A1C: Hgb A1c MFr Bld: 9.6 % — ABNORMAL HIGH (ref 4.6–6.5)

## 2023-07-28 MED ORDER — TOUJEO MAX SOLOSTAR 300 UNIT/ML ~~LOC~~ SOPN: 30.0000 [IU] | PEN_INJECTOR | Freq: Every day | SUBCUTANEOUS | 1 refills | Status: DC

## 2023-07-28 MED ORDER — DEXCOM G7 SENSOR MISC
1.0000 | Freq: Every day | 1 refills | Status: DC
Start: 2023-07-28 — End: 2023-08-07

## 2023-07-28 MED ORDER — INSULIN PEN NEEDLE 32G X 6 MM MISC
1.0000 | 1 refills | Status: AC
Start: 2023-07-28 — End: ?

## 2023-07-28 MED ORDER — DEXCOM G7 RECEIVER DEVI
1.0000 | Freq: Every day | 1 refills | Status: DC
Start: 2023-07-28 — End: 2023-08-07

## 2023-07-28 NOTE — Patient Instructions (Signed)

## 2023-07-28 NOTE — Progress Notes (Unsigned)
Subjective:  Patient ID: Bridget Butler, female    DOB: Mar 07, 1946  Age: 77 y.o. MRN: [id number]  CC: Annual Exam, Hypertension, Hyperlipidemia, and Diabetes   HPI Bridget Butler presents for a CPX and f/up ---  Discussed the use of AI scribe software for clinical note transcription with the patient, who gave verbal consent to proceed.  History of Present Illness   The patient, with a history of thyroid disease, presented with a complaint of vertigo approximately 30 days ago. The vertigo was significant enough to warrant medication, which unfortunately resulted in excessive sleepiness. However, the vertigo has since resolved spontaneously. The patient denies any chest pain, shortness of breath, dizziness, lightheadedness, fatigue, or irregular heartbeat during daily activities, which include walking the dog and performing nightly exercises.  Regarding their thyroid disease, the patient was unaware of their diagnosis and is not currently taking tapazole, a medication for overactive thyroid. They deny symptoms of overactive thyroid such as racing heart, insomnia, weight loss, diarrhea, or jitteriness. The patient also mentioned a previous thyroid examination due to the presence of 'crystals.' They occasionally experience swelling over the thyroid gland but deny any current edema or pain in the extremities.       Outpatient Medications Prior to Visit  Medication Sig Dispense Refill   Blood Glucose Monitoring Suppl (TRUE METRIX METER) DEVI 1 Device by Does not apply route 2 (two) times daily. 1 each 2   Cholecalciferol 50 MCG (2000 UT) TABS Take 1 tablet (2,000 Units total) by mouth daily. 90 tablet 1   glucose blood (TRUE METRIX BLOOD GLUCOSE TEST) test strip 1 each by Other route 2 (two) times daily. USE AS INSTRUCTED TO CHECK BLOOD SUGAR ONE TIME DAILY 100 strip 4   meclizine (ANTIVERT) 12.5 MG tablet Take 1 tablet (12.5 mg total) by mouth 3 (three) times daily as needed for dizziness. 30 tablet  1   methimazole (TAPAZOLE) 5 MG tablet Take 1 tablet (5 mg total) by mouth daily. 45 tablet 3   Multiple Vitamin (MULTIVITAMIN WITH MINERALS) TABS tablet Take 1 tablet by mouth daily.     olmesartan (BENICAR) 20 MG tablet Take 1 tablet (20 mg total) by mouth daily. 90 tablet 1   omeprazole (PRILOSEC) 40 MG capsule TAKE 1 CAPSULE BY MOUTH TWICE DAILY --  IN  THE  MORNING  AND  AT  BEDTIME 180 capsule 0   potassium chloride (KLOR-CON 10) 10 MEQ tablet Take 1 tablet (10 mEq total) by mouth 2 (two) times daily. 180 tablet 0   pravastatin (PRAVACHOL) 40 MG tablet Take 1 tablet (40 mg total) by mouth at bedtime. 90 tablet 0   repaglinide (PRANDIN) 1 MG tablet Take 1 tablet (1 mg total) by mouth daily before supper. 90 tablet 3   amLODipine (NORVASC) 5 MG tablet Take 1 tablet by mouth once daily 90 tablet 0   Dulaglutide (TRULICITY) 3 MG/0.5ML SOPN Inject 3 mg into the skin once a week. 6 mL 3   Insulin Glargine (BASAGLAR KWIKPEN) 100 UNIT/ML DIAL AND INJECT 16 UNITS UNDER THE SKIN DAILY. MAX DAILY DOSE IS 16 UNITS. 15 mL 0   Insulin Pen Needle (PEN NEEDLES) 32G X 5 MM MISC 1 Units by Does not apply route daily. 100 each 5   Tetrahydrozoline HCl (VISINE OP) Apply 1 drop to eye daily as needed (dry eyes).     No facility-administered medications prior to visit.    ROS Review of Systems  Constitutional:  Negative for appetite  change, diaphoresis, fatigue and unexpected weight change.  HENT: Negative.  Negative for sore throat and trouble swallowing.   Eyes: Negative.   Respiratory:  Negative for cough, chest tightness and shortness of breath.   Cardiovascular:  Negative for chest pain, palpitations and leg swelling.  Gastrointestinal:  Negative for abdominal pain, diarrhea, nausea and vomiting.  Endocrine: Negative.  Negative for cold intolerance and heat intolerance.  Genitourinary: Negative.  Negative for difficulty urinating.  Musculoskeletal: Negative.  Negative for arthralgias and myalgias.   Skin: Negative.   Hematological:  Negative for adenopathy. Does not bruise/bleed easily.  Psychiatric/Behavioral:  Positive for confusion and decreased concentration. Negative for self-injury. The patient is not nervous/anxious.     Objective:  BP (!) 132/58 (BP Location: Left Arm, Patient Position: Sitting, Cuff Size: Large)   Pulse 96   Temp 98.3 F (36.8 C) (Oral)   Resp 16   Ht 5\' 3"  (1.6 m)   Wt 188 lb (85.3 kg)   SpO2 98%   BMI 33.30 kg/m   BP Readings from Last 3 Encounters:  07/28/23 (!) 132/58  07/01/23 130/84  05/04/23 138/70    Wt Readings from Last 3 Encounters:  07/28/23 188 lb (85.3 kg)  07/01/23 191 lb (86.6 kg)  05/04/23 190 lb (86.2 kg)    Physical Exam Constitutional:      Appearance: Normal appearance.  HENT:     Mouth/Throat:     Mouth: Mucous membranes are moist.  Eyes:     General: No scleral icterus.    Conjunctiva/sclera: Conjunctivae normal.  Neck:     Thyroid: No thyroid mass, thyromegaly or thyroid tenderness.  Cardiovascular:     Rate and Rhythm: Normal rate and regular rhythm.     Heart sounds: No murmur heard.    No friction rub. No gallop.     Comments: EKG--- NSR, 87 bpm No LVH, Q waves, or ST/T waves  Pulmonary:     Effort: Pulmonary effort is normal.     Breath sounds: No stridor. No wheezing, rhonchi or rales.  Abdominal:     General: Abdomen is protuberant. There is no distension or abdominal bruit.     Palpations: There is no hepatomegaly, splenomegaly or mass.     Tenderness: There is no abdominal tenderness. There is no guarding.     Hernia: No hernia is present.  Musculoskeletal:        General: Normal range of motion.     Cervical back: Normal range of motion and neck supple.     Right lower leg: No edema.     Left lower leg: No edema.  Lymphadenopathy:     Cervical: No cervical adenopathy.  Skin:    General: Skin is warm and dry.  Neurological:     General: No focal deficit present.     Mental Status: She is  alert. Mental status is at baseline.  Psychiatric:        Mood and Affect: Mood normal.        Behavior: Behavior normal.     Lab Results  Component Value Date   WBC 6.5 07/28/2023   HGB 11.1 (L) 07/28/2023   HCT 34.5 (L) 07/28/2023   PLT 291.0 07/28/2023   GLUCOSE 322 (H) 07/28/2023   CHOL 207 (H) 07/28/2023   TRIG 178.0 (H) 07/28/2023   HDL 72.30 07/28/2023   LDLCALC 99 07/28/2023   ALT 20 07/28/2023   AST 17 07/28/2023   NA 134 (L) 07/28/2023   K 4.3  07/28/2023   CL 99 07/28/2023   CREATININE 1.14 07/28/2023   BUN 13 07/28/2023   CO2 29 07/28/2023   TSH 0.36 (L) 07/28/2023   HGBA1C 9.6 (H) 07/28/2023   MICROALBUR 15.7 (H) 07/28/2023    CT Abdomen Pelvis W Contrast  Result Date: 01/30/2023 CLINICAL DATA:  Abdominal pain and distension. Bloating, diarrhea and abdominal cramping. EXAM: CT ABDOMEN AND PELVIS WITH CONTRAST TECHNIQUE: Multidetector CT imaging of the abdomen and pelvis was performed using the standard protocol following bolus administration of intravenous contrast. RADIATION DOSE REDUCTION: This exam was performed according to the departmental dose-optimization program which includes automated exposure control, adjustment of the mA and/or kV according to patient size and/or use of iterative reconstruction technique. CONTRAST:  ISOVUE-300 IOPAMIDOL (ISOVUE-300) INJECTION 61% COMPARISON:  None Available. FINDINGS: Lower chest: Lung bases are clear. Heart is enlarged. No pericardial or pleural effusion. Distal esophagus is grossly unremarkable. Hepatobiliary: Subcentimeter low-attenuation lesion in the left hepatic lobe, too small to characterize but likely a cyst. Liver is otherwise unremarkable. Cholecystectomy. No biliary ductal dilatation. Pancreas: Negative. Spleen: Negative. Adrenals/Urinary Tract: Adrenal glands and kidneys are unremarkable. Ureters are decompressed. Marked bladder distension. Stomach/Bowel: Stomach, small bowel, appendix and colon are  unremarkable. Vascular/Lymphatic: Atherosclerotic calcification of the aorta. No pathologically enlarged lymph nodes. Reproductive: Uterus is visualized.  No adnexal mass. Other: Small bilateral inguinal hernias contain fat.  No free fluid. Musculoskeletal: None. IMPRESSION: 1. Marked bladder distension.  No hydronephrosis. 2.  Aortic atherosclerosis (ICD10-I70.0). Electronically Signed   By: Leanna Battles M.D.   On: 01/30/2023 13:34    Assessment & Plan:   Insulin-requiring or dependent type II diabetes mellitus (HCC)- Her A1c is up to 9.6%.  I recommended that she restart basal insulin. -     Microalbumin / creatinine urine ratio; Future -     Urinalysis, Routine w reflex microscopic; Future -     Hemoglobin A1c; Future -     Basic metabolic panel; Future -     Dexcom G7 Sensor; 1 Act by Does not apply route daily.  Dispense: 9 each; Refill: 1 -     Dexcom G7 Receiver; 1 Act by Does not apply route daily.  Dispense: 9 each; Refill: 1 -     Toujeo Max SoloStar; Inject 30 Units into the skin daily.  Dispense: 9 mL; Refill: 1 -     AMB Referral to Pharmacy Medication Management -     Insulin Pen Needle; 1 Act by Does not apply route once a week.  Dispense: 100 each; Refill: 1  Essential hypertension- Her blood pressure is somewhat overcontrolled.  EKG is negative for LVH.  Will discontinue amlodipine. -     Urinalysis, Routine w reflex microscopic; Future -     Hepatic function panel; Future -     Basic metabolic panel; Future -     EKG 12-Lead  Stage 3a chronic kidney disease (HCC) -     Microalbumin / creatinine urine ratio; Future -     Urinalysis, Routine w reflex microscopic; Future -     Basic metabolic panel; Future -     AMB Referral to Pharmacy Medication Management  Hyperlipidemia LDL goal <100 - LDL goal achieved. Doing well on the statin  -     Lipid panel; Future -     Hepatic function panel; Future  Anemia, chronic disease -     CBC with Differential/Platelet;  Future  Encounter for general adult medical examination with abnormal findings- Exam  completed, labs reviewed, vaccines reviewed and updated, no cancer screenings indicated, pt ed material was given.   Low serum thyroid stimulating hormone (TSH)- This is consistent with subclinical hyperthyroidism. -     Thyroid Panel With TSH; Future -     Thyroid peroxidase antibody; Future  Type 2 diabetes mellitus with stage 3b chronic kidney disease, with long-term current use of insulin (HCC)- Will start a mineralocorticoid antagonist. Chauncey Mann; Take 1 tablet (10 mg total) by mouth daily.  Dispense: 90 tablet; Refill: 0  Other orders -     Boostrix; Inject 0.5 mLs into the muscle once for 1 dose.  Dispense: 0.5 mL; Refill: 0 -     Shingrix; Inject 0.5 mLs into the muscle once for 1 dose.  Dispense: 0.5 mL; Refill: 1     Follow-up: Return in about 6 months (around 01/26/2024).  Sanda Linger, MD

## 2023-07-29 ENCOUNTER — Telehealth: Payer: Self-pay

## 2023-07-29 DIAGNOSIS — Z794 Long term (current) use of insulin: Secondary | ICD-10-CM | POA: Insufficient documentation

## 2023-07-29 DIAGNOSIS — N1832 Chronic kidney disease, stage 3b: Secondary | ICD-10-CM | POA: Insufficient documentation

## 2023-07-29 LAB — URINALYSIS, ROUTINE W REFLEX MICROSCOPIC
Bilirubin Urine: NEGATIVE
Ketones, ur: NEGATIVE
Nitrite: NEGATIVE
Specific Gravity, Urine: 1.02 (ref 1.000–1.030)
Urine Glucose: >=1000 — AB
Urobilinogen, UA: 0.2 (ref 0.0–1.0)
pH: 6 (ref 5.0–8.0)

## 2023-07-29 LAB — MICROALBUMIN / CREATININE URINE RATIO
Creatinine,U: 73.7 mg/dL
Microalb Creat Ratio: 21.4 mg/g (ref 0.0–30.0)
Microalb, Ur: 15.7 mg/dL — ABNORMAL HIGH (ref 0.0–1.9)

## 2023-07-29 LAB — THYROID PANEL WITH TSH
Free Thyroxine Index: 1.7 (ref 1.4–3.8)
T3 Uptake: 30 % (ref 22–35)
T4, Total: 5.8 ug/dL (ref 5.1–11.9)
TSH: 0.36 m[IU]/L — ABNORMAL LOW (ref 0.40–4.50)

## 2023-07-29 LAB — THYROID PEROXIDASE ANTIBODY: Thyroperoxidase Ab SerPl-aCnc: 3 [IU]/mL (ref <9)

## 2023-07-29 MED ORDER — KERENDIA 10 MG PO TABS
1.0000 | ORAL_TABLET | Freq: Every day | ORAL | 0 refills | Status: DC
Start: 2023-07-29 — End: 2023-09-09

## 2023-07-29 MED ORDER — BOOSTRIX 5-2.5-18.5 LF-MCG/0.5 IM SUSP
0.5000 mL | Freq: Once | INTRAMUSCULAR | 0 refills | Status: AC
Start: 2023-07-29 — End: 2023-07-29

## 2023-07-29 MED ORDER — SHINGRIX 50 MCG/0.5ML IM SUSR
0.5000 mL | Freq: Once | INTRAMUSCULAR | 1 refills | Status: DC
Start: 2023-07-29 — End: 2023-08-06

## 2023-07-29 NOTE — Progress Notes (Signed)
Care Guide Note  07/29/2023 Name: Bridget Butler MRN: [id number] DOB: 1946-04-12  Referred by: Etta Grandchild, MD Reason for referral : Care Coordination (Outreach to schedule with Pharm d )   Bridget Butler is a 77 y.o. year old female who is a primary care patient of Etta Grandchild, MD. Felipa Evener Delangel was referred to the pharmacist for assistance related to DM.    An unsuccessful telephone outreach was attempted today to contact the patient who was referred to the pharmacy team for assistance with medication management. Additional attempts will be made to contact the patient.   Penne Lash, RMA Care Guide [facility]  [place], [place] [address] Direct Dial: [phone] [email or web address]

## 2023-08-04 DIAGNOSIS — Z1231 Encounter for screening mammogram for malignant neoplasm of breast: Secondary | ICD-10-CM | POA: Diagnosis not present

## 2023-08-04 LAB — HM MAMMOGRAPHY

## 2023-08-04 NOTE — Progress Notes (Signed)
Care Guide Note  08/04/2023 Name: Bridget Butler MRN: [id number] DOB: September 14, 1946  Referred by: Etta Grandchild, MD Reason for referral : Care Coordination (Outreach to schedule with Pharm d )   Bridget Butler is a 77 y.o. year old female who is a primary care patient of Etta Grandchild, MD. Bridget Butler was referred to the pharmacist for assistance related to DM.    Successful contact was made with the patient to discuss pharmacy services including being ready for the pharmacist to call at least 5 minutes before the scheduled appointment time, to have medication bottles and any blood sugar or blood pressure readings ready for review. The patient agreed to meet with the pharmacist via with the pharmacist via telephone visit on (date/time).  08/06/2023  Penne Lash, RMA Care Guide [facility]  [place], [place] [address] Direct Dial: [phone] [email or web address]

## 2023-08-05 ENCOUNTER — Encounter: Payer: Self-pay | Admitting: Internal Medicine

## 2023-08-06 ENCOUNTER — Other Ambulatory Visit: Payer: Medicare PPO | Admitting: Pharmacist

## 2023-08-06 DIAGNOSIS — E119 Type 2 diabetes mellitus without complications: Secondary | ICD-10-CM

## 2023-08-06 DIAGNOSIS — Z794 Long term (current) use of insulin: Secondary | ICD-10-CM

## 2023-08-06 NOTE — Progress Notes (Signed)
08/07/2023 Name: Bridget Butler MRN: [id number] DOB: 06-17-1946  Chief Complaint  Patient presents with   Diabetes   Medication Management   Chronic Kidney Disease    Bridget Butler is a 77 y.o. year old female who presented for a telephone visit.   They were referred to the pharmacist by their PCP for assistance in managing diabetes and CKD .   Subjective:  Care Team: Primary Care Provider: Etta Grandchild, MD ; Next Scheduled Visit: none scheduled Endocrinologist Dr. Lafe Garin; Next Scheduled Visit: 09/08/2023  Medication Access/Adherence  Current Pharmacy:  [facility] [address] ([address]), [place] - [address] [place] ([place]) [place] [address] Phone: [phone] Fax: [phone]  [facility] [place], [place] - [address] [place] [address] Phone: [phone] Fax: [phone]  [facility] - [place], [place] - [address] [place] [address] Phone: [phone] Fax: [phone]   Patient reports affordability concerns with their medications: No  Patient reports access/transportation concerns to their pharmacy: No  Patient reports adherence concerns with their medications:  No     Diabetes:  Current medications: Toujeo 17 units daily, Trulicity 3 mg weekly Medications tried in the past: metformin XR (stopped recently due to GI upset), farxiga  Current glucose readings: Fasting BG 185, yesterday 135 Using glucometer, not using Dexcom  Current meal patterns:  - Breakfast: whole grain cereal with almond milk, banana - Lunch: chicken sandwich - Supper: meat, vegetable, starch - Snacks: Pringles or popcorn - Drinks: 1 liter of soda per week  Current physical activity: walks dog every morning, does exercises at home before bed. Used to do water aerobics but not currently  Current medication access support: Trulicity  through PAP  CKD: Recently prescribed Chauncey Mann but has not started/has not heard from the pharmacy. Currently taking olmesartan   Objective:  Lab Results  Component Value Date   HGBA1C 9.6 (H) 07/28/2023    Lab Results  Component Value Date   CREATININE 1.14 07/28/2023   BUN 13 07/28/2023   NA 134 (L) 07/28/2023   K 4.3 07/28/2023   CL 99 07/28/2023   CO2 29 07/28/2023    Lab Results  Component Value Date   CHOL 207 (H) 07/28/2023   HDL 72.30 07/28/2023   LDLCALC 99 07/28/2023   TRIG 178.0 (H) 07/28/2023   CHOLHDL 3 07/28/2023    Medications Reviewed Today     Reviewed by Bonita Quin, RPH (Pharmacist) on 08/07/23 at 0844  Med List Status: <None>   Medication Order Taking? Sig Documenting Provider Last Dose Status Informant  Blood Glucose Monitoring Suppl (TRUE METRIX METER) DEVI [id number]  1 Device by Does not apply route 2 (two) times daily. Etta Grandchild, MD  Active   Cholecalciferol 50 MCG (2000 UT) TABS [id number] No Take 1 tablet (2,000 Units total) by mouth daily.  Patient not taking: Reported on 08/06/2023   Etta Grandchild, MD Not Taking Active   Continuous Glucose Receiver [facility] G7 RECEIVER) [place] [id number] No 1 Act by Does not apply route daily.  Patient not taking: Reported on 08/06/2023   Etta Grandchild, MD Not Taking Active   Continuous Glucose Sensor (DEXCOM G7 SENSOR) MISC [id number] No 1 Act by Does not apply route daily.  Patient not taking: Reported on 08/06/2023   Etta Grandchild, MD Not Taking Active   [facility])  10 MG TABS [id number] No Take 1 tablet (10 mg total) by mouth daily.  Patient not taking: Reported on 08/06/2023   Etta Grandchild, MD Not Taking Active   glucose blood (TRUE METRIX BLOOD GLUCOSE TEST) test strip [id number] Yes 1 each by Other route 2 (two) times daily. USE AS INSTRUCTED TO CHECK BLOOD SUGAR ONE TIME DAILY Etta Grandchild, MD Taking Active   insulin glargine, 2 Unit Dial, (TOUJEO MAX SOLOSTAR) 300  UNIT/ML Solostar Pen [id number] Yes Inject 30 Units into the skin daily. Etta Grandchild, MD Taking Active   Insulin Pen Needle 32G X 6 MM MISC [id number]  1 Act by Does not apply route once a week. Etta Grandchild, MD  Active   meclizine (ANTIVERT) 12.5 MG tablet [id number]  Take 1 tablet (12.5 mg total) by mouth 3 (three) times daily as needed for dizziness. Corwin Levins, MD  Active   methimazole (TAPAZOLE) 5 MG tablet [id number] Yes Take 1 tablet (5 mg total) by mouth daily. Carlus Pavlov, MD Taking Active   Multiple Vitamin (MULTIVITAMIN WITH MINERALS) TABS tablet [id number] No Take 1 tablet by mouth daily.  Patient not taking: Reported on 08/06/2023   [provider] Not Taking Active Self  olmesartan (BENICAR) 20 MG tablet [id number] Yes Take 1 tablet (20 mg total) by mouth daily. Corwin Levins, MD Taking Consider Medication Status and Discontinue   omeprazole (PRILOSEC) 40 MG capsule [id number] Yes TAKE 1 CAPSULE BY MOUTH TWICE DAILY --  IN  THE  MORNING  AND  AT  BEDTIME  Patient taking differently: Take 40 mg by mouth daily. TAKE 1 CAPSULE BY MOUTH TWICE DAILY --  IN  THE  MORNING  AND  AT  BEDTIME   Etta Grandchild, MD Taking Active   potassium chloride (KLOR-CON 10) 10 MEQ tablet [id number] Yes Take 1 tablet (10 mEq total) by mouth 2 (two) times daily. Etta Grandchild, MD Taking Active   pravastatin (PRAVACHOL) 40 MG tablet [id number] Yes Take 1 tablet (40 mg total) by mouth at bedtime. Etta Grandchild, MD Taking Active   repaglinide (PRANDIN) 1 MG tablet [id number] No Take 1 tablet (1 mg total) by mouth daily before supper.  Patient not taking: Reported on 08/06/2023   Carlus Pavlov, MD Not Taking Active               Assessment/Plan:   Diabetes: - Currently uncontrolled, A1c goal <8% - Reviewed goal fasting, and goal 2 hour post prandial glucose - Reviewed dietary modifications including increasing protein and fiber, focusing on balanced snacks/meals, carb heavy  foods in moderation, reducing soda consumption - Recommend to increase Toujeo to 20 units daily - has upcoming follow up with endocrinologist in 1 month. Recommended discussing with Dr. Wyonia Hough switching Trulicity to [facility]    CKD - She will contact the pharmacy to check on Kerendia. Cost may be a barrier.  Follow Up Plan: 11/1 telephone follow up  Arbutus Leas, PharmD, BCPS [facility] [phone]

## 2023-08-12 ENCOUNTER — Ambulatory Visit
Admission: RE | Admit: 2023-08-12 | Discharge: 2023-08-12 | Disposition: A | Discharge status: Home / Self Care | Payer: Medicare PPO | Source: Ambulatory Visit | Attending: Internal Medicine | Admitting: Internal Medicine

## 2023-08-12 DIAGNOSIS — E2839 Other primary ovarian failure: Secondary | ICD-10-CM

## 2023-08-13 ENCOUNTER — Ambulatory Visit: Payer: Medicare PPO | Attending: Internal Medicine | Admitting: Physical Therapy

## 2023-08-13 ENCOUNTER — Other Ambulatory Visit: Payer: Self-pay

## 2023-08-13 ENCOUNTER — Encounter: Payer: Self-pay | Admitting: Physical Therapy

## 2023-08-13 VITALS — BP 121/62 | HR 73

## 2023-08-13 DIAGNOSIS — R2681 Unsteadiness on feet: Secondary | ICD-10-CM | POA: Diagnosis not present

## 2023-08-13 DIAGNOSIS — R42 Dizziness and giddiness: Secondary | ICD-10-CM | POA: Diagnosis not present

## 2023-08-13 NOTE — Therapy (Signed)
OUTPATIENT PHYSICAL THERAPY VESTIBULAR EVALUATION     Patient Name: Bridget Butler MRN: [id number] DOB:1946-09-05, 77 y.o., female Today's Date: 08/14/2023  END OF SESSION:  PT End of Session - 08/13/23 1404     Visit Number 1    Number of Visits 7    Date for PT Re-Evaluation 09/18/23    Authorization Type [employer]    PT Start Time 1400    PT Stop Time 1445    PT Time Calculation (min) 45 min    Equipment Utilized During Treatment Other (comment)   performed at mat level   Activity Tolerance Patient tolerated treatment well    Behavior During Therapy WFL for tasks assessed/performed             Past Medical History:  Diagnosis Date   Cataract    Diabetes mellitus without complication (HCC)    Dyspnea    on exertion   GERD (gastroesophageal reflux disease)    Hyperlipidemia    Hypertension    Multiple thyroid nodules    Thyroid disease    Past Surgical History:  Procedure Laterality Date   CHOLECYSTECTOMY     COSMETIC SURGERY Bilateral    eyelids   EYE SURGERY     GALLBLADDER SURGERY     MICROLARYNGOSCOPY WITH CO2 LASER AND EXCISION OF VOCAL CORD LESION N/A 04/02/2020   Procedure: MICROLARYNGOSCOPY WITH DILATION AND CO2 LASER AND EXCISION OF VOCAL CORD LESION w/MITOMYCIN C;  Surgeon: Christia Reading, MD;  Location: [facility] OR;  Service: ENT;  Laterality: N/A;   MICROLARYNGOSCOPY WITH DILATION N/A 05/23/2020   Procedure: MICROLARYNGOSCOPY W/ DILATION AND JET VENTILATION;  Surgeon: Christia Reading, MD;  Location: [facility] OR;  Service: ENT;  Laterality: N/A;   MICROLARYNGOSCOPY WITH DILATION N/A 07/13/2020   Procedure: MICROLARYNGOSCOPY with Jet Ventilation;  Surgeon: Christia Reading, MD;  Location: [facility] OR;  Service: ENT;  Laterality: N/A;   TRACHEOSTOMY TUBE PLACEMENT N/A 04/02/2020   Procedure: Awake TRACHEOSTOMY;  Surgeon: Christia Reading, MD;  Location: [facility] OR;  Service: ENT;  Laterality: N/A;   TUBAL LIGATION     Patient Active Problem List   Diagnosis Date Noted   Type 2  diabetes mellitus with stage 3b chronic kidney disease, with long-term current use of insulin (HCC) 07/29/2023   Low serum thyroid stimulating hormone (TSH) 07/28/2023   Anemia, chronic disease 02/18/2023   Hypertension associated with diabetes (HCC) 12/31/2022   Bilateral hearing loss 12/31/2021   Encounter for general adult medical examination with abnormal findings 05/06/2021   Stage 3a chronic kidney disease (HCC) 05/01/2021   Visit for screening mammogram 05/01/2021   Vitamin D deficiency 02/26/2021   Carpal tunnel syndrome of right wrist 02/26/2021   Class 2 severe obesity due to excess calories with serious comorbidity and body mass index (BMI) of 35.0 to 35.9 in adult (HCC) 03/29/2018   Insulin-requiring or dependent type II diabetes mellitus (HCC) 01/31/2018   Essential hypertension 01/31/2018   Hyperlipidemia LDL goal <100 01/31/2018   Gastroesophageal reflux disease 01/31/2018   Osteopenia of multiple sites 12/21/2015    PCP: Etta Grandchild, MD REFERRING PROVIDER: Corwin Levins, MD  REFERRING DIAG: R42 (ICD-10-CM) - Vertigo  THERAPY DIAG:  Dizziness and giddiness - Plan: PT plan of care cert/re-cert  Unsteadiness on feet - Plan: PT plan of care cert/re-cert  ONSET DATE: 07/01/2023 (referral date)  Rationale for Evaluation and Treatment: Rehabilitation  SUBJECTIVE:   SUBJECTIVE STATEMENT: Patient reports that she has vertigo. Patient reports that the onset  of symptoms was about 3 months ago. Patient reports that she started noticing symptoms when she first gets up in the morning. She notices that when she stands up she has to be more careful and then when she looks up at the ceiling she also get dizzy. Patient reports greatest dizziness when she goes to lay down at night. Patient reports that she tried meclizine but stopped taking it as it made her dizzy. Patient confirms that the room is spinning. Patient reports that she has had some nausea sometimes. Patient states  that the room spinning last for likely 30 seconds or less. Patient reports that her symptoms are worse when she lays on her back but has not noticed a side preference. Patient reports that she has not checked her sugars when she has gotten dizzy. Patient has past medical history of bilateral hearing loss and macular degeneration of the L eye.   Patient had a bone density test done yesterday but results have not been possessed yet but based on last bone density was osteopenic not osteoporotic.   Pt accompanied by: self  PERTINENT HISTORY: diabetes controlled on insulin, hypertension, macular degeneration on L eye, ptosis of R eye (chronic), thyroid disease    PAIN:  Are you having pain? No  PRECAUTIONS: Fall  RED FLAGS: None   WEIGHT BEARING RESTRICTIONS: No  FALLS: Has patient fallen in last 6 months? Yes. Number of falls 1 - tripped over gait in flower bed, able to get up by self  LIVING ENVIRONMENT: Lives with: lives with their family Lives in: House/apartment Stairs: Yes: External: 2 steps; can reach both Has following equipment at home: Grab bars  PLOF: Independent and Independent with basic ADLs  PATIENT GOALS: "To get rid of vertigo."   OBJECTIVE:  Note: Objective measures were completed at Evaluation unless otherwise noted.  DIAGNOSTIC FINDINGS: No relevant recent imaging   COGNITION: Overall cognitive status: Within functional limits for tasks assessed   SENSATION: WFL  EDEMA:  [place]sc[place]  Cervical ROM:    WFL for positional testing in all planes, mild reduction   PATIENT SURVEYS:  FOTO 62  VESTIBULAR ASSESSMENT:  GENERAL OBSERVATION: wears bifocals, ambulates without AD   SYMPTOM BEHAVIOR:  Subjective history: see above  Non-Vestibular symptoms: nausea/vomiting Type of dizziness: Blurred Vision, Imbalance (Disequilibrium), Spinning/Vertigo, and Unsteady with head/body turns  Frequency: every day  Duration: 30 seconds or less  Aggravating factors:  laying back in bed, getting up, and looking up  Relieving factors: closing eyes  Progression of symptoms: unchanged  OCULOMOTOR EXAM:  *Limited by macular degeneration in L eye, patient denies visuals going out of field of vision with testing  Ocular Alignment:  pstosis of R side resulting in L eye appearing larger  Ocular ROM: No Limitations  Spontaneous Nystagmus: absent  Gaze-Induced Nystagmus: absent Smooth Pursuits: intact - very mild correction when crossing midline, WFL for age matched norms  Saccades: slow (WFL for age matched norms)  Convergence/Divergence: < 5 cm   Cover Uncover: exotrophoria bilaterally very mild  VBI: negative bilaterally   VESTIBULAR - OCULAR REFLEX:   *Limited by macular degeneration in L eye, patient denies visuals going out of field of vision with testing   Slow VOR: Normal  VOR Cancellation: Normal Head-Impulse Test: HIT Right: mild extra eye movements noted, unclear if nystagmus at end range (normal for age matched norms) or due to visual deficit resulting in mild correction  HIT Left: mild extra eye movements noted, unclear if nystagmus or due  to visual deficit (as noted above)  Dynamic Visual Acuity: To be assessed as indicated   POSITIONAL TESTING:  Right Dix-Hallpike: a few small beats of suspected very small amplitude rotatory nystagmus < 1-2 beats with possible upbeat but not seen when retested, recommend rechecking Left Dix-Hallpike: no nystagmus Right Roll Test: no nystagmus Left Roll Test: no nystagmus  MOTION SENSITIVITY:  Motion Sensitivity Quotient Intensity: 0 = none, 1 = Lightheaded, 2 = Mild, 3 = Moderate, 4 = Severe, 5 = Vomiting  Intensity  1. Sitting to supine   2. Supine to L side   3. Supine to R side   4. Supine to sitting   5. L Hallpike-Dix 2  6. Up from L  2  7. R Hallpike-Dix 2  8. Up from R  2  9. Sitting, head tipped to L knee   10. Head up from L knee   11. Sitting, head tipped to R knee   12. Head up from  R knee   13. Sitting head turns x5 0  14.Sitting head nods x5 0  15. In stance, 180 turn to L    16. In stance, 180 turn to R     OTHOSTATICS: Consider assessing   VESTIBULAR TREATMENT:                                                                                                    PATIENT EDUCATION: Education details: POC, goal collaboration, examination findings  Person educated: Patient Education method: Explanation Education comprehension: verbalized understanding and needs further education  HOME EXERCISE PROGRAM:  To be provided   GOALS: Goals reviewed with patient? Yes  LONG TERM GOALS: Target date: 09/18/2023 (STG = LTG due to POC length)   Patient will report demonstrate independence with final HEP in order to maintain current gains and continue to progress after physical therapy discharge.   Baseline: To be provided  Goal status: INITIAL  2.  MSQ to finish being assessed / LTG written as indicated Baseline: worst thus far 2/5 Goal status: INITIAL  3.  SOT to be assessed / LTG written Baseline: To be assessed  Goal status: INITIAL  4.  Patient will improve FOTO score to 63 to achieve predicted improvements in functional mobility due to skilled physical therapy interventions to increase safety with and participation in daily activities.  Baseline: 62 Goal status: INITIAL  ASSESSMENT:  CLINICAL IMPRESSION: Patient is a 77 y.o. female who was seen today for physical therapy evaluation and treatment for reports of vertigo most noticed when laying back in bed, getting up in the morning, and looking up at the ceiling. Therapist performed full assessment. Occulomotor and vestibular testing likely limited by patient's macular degeneration of L eye slightly. Patient with known PMH of ptosis. Paitent just has Dat Scan performed but last known bone density was osteopenic so [employer] performed. Patient presents with mild motion sensitivity on tested components thus far  on MSQ. Positional testing showed plausible mild nystagmus with R Gilberto Better but only a few beats and not traditional amplitude of BPPV; recommend retesting  in future session. Patient will benefit from vestibular physical therapy to address deficits and progress towards PLOF.   OBJECTIVE IMPAIRMENTS: decreased balance and dizziness.   ACTIVITY LIMITATIONS:  balance, looking up, laying back in bed, getting up in the morning   PARTICIPATION LIMITATIONS: cleaning and community activity  PERSONAL FACTORS: 3+ comorbidities: see above  are also affecting patient's functional outcome.   REHAB POTENTIAL: Good  CLINICAL DECISION MAKING: Stable/uncomplicated  EVALUATION COMPLEXITY: Low   PLAN:  PT FREQUENCY: 2x/week  PT DURATION: 3 weeks  PLANNED INTERVENTIONS: 97164- PT Re-evaluation, 97110-Therapeutic exercises, 97530- Therapeutic activity, 97112- Neuromuscular re-education, 97535- Self Care, 91478- Manual therapy, [phone]- Gait training, and [phone]- Canalith repositioning  PLAN FOR NEXT SESSION: finish MSQ and retest positional testing especially R Gilberto Better, perform SOT and update LTGs as needed, create initial HEP working on components of SOT or components of MSQ as needed   *watch for updated Dat score results as needed for positional testing modifications    Carmelia Bake, PT, DPT 08/14/2023, 8:04 AM

## 2023-08-15 ENCOUNTER — Other Ambulatory Visit: Payer: Self-pay | Admitting: Internal Medicine

## 2023-08-15 ENCOUNTER — Other Ambulatory Visit (HOSPITAL_COMMUNITY): Payer: Self-pay

## 2023-08-15 DIAGNOSIS — M810 Age-related osteoporosis without current pathological fracture: Secondary | ICD-10-CM | POA: Insufficient documentation

## 2023-08-15 MED ORDER — DENOSUMAB 60 MG/ML ~~LOC~~ SOSY
60.0000 mg | PREFILLED_SYRINGE | Freq: Once | SUBCUTANEOUS | 0 refills | Status: AC
  Filled 2023-08-15: qty 1, 1d supply, fill #0
  Filled 2023-08-19: qty 1, 180d supply, fill #0

## 2023-08-17 ENCOUNTER — Other Ambulatory Visit: Payer: Self-pay

## 2023-08-17 ENCOUNTER — Encounter (HOSPITAL_COMMUNITY): Payer: Self-pay

## 2023-08-19 ENCOUNTER — Other Ambulatory Visit: Payer: Self-pay

## 2023-08-19 ENCOUNTER — Telehealth: Payer: Self-pay

## 2023-08-19 DIAGNOSIS — Z5986 Financial insecurity: Secondary | ICD-10-CM

## 2023-08-19 NOTE — Progress Notes (Signed)
Copay is $364. No copay assistance available. Patient is going to reach out to provider about alternatives.

## 2023-08-20 ENCOUNTER — Encounter: Payer: Self-pay | Admitting: Internal Medicine

## 2023-08-20 ENCOUNTER — Encounter: Payer: Self-pay | Admitting: Physical Therapy

## 2023-08-20 ENCOUNTER — Telehealth: Payer: Self-pay | Admitting: Internal Medicine

## 2023-08-20 ENCOUNTER — Ambulatory Visit: Payer: Medicare PPO | Admitting: Internal Medicine

## 2023-08-20 ENCOUNTER — Ambulatory Visit: Payer: Medicare PPO | Admitting: Physical Therapy

## 2023-08-20 VITALS — BP 108/57 | HR 87

## 2023-08-20 VITALS — BP 120/68 | HR 78 | Ht 63.0 in | Wt 187.4 lb

## 2023-08-20 DIAGNOSIS — E1122 Type 2 diabetes mellitus with diabetic chronic kidney disease: Secondary | ICD-10-CM | POA: Diagnosis not present

## 2023-08-20 DIAGNOSIS — R42 Dizziness and giddiness: Secondary | ICD-10-CM

## 2023-08-20 DIAGNOSIS — N1831 Chronic kidney disease, stage 3a: Secondary | ICD-10-CM | POA: Diagnosis not present

## 2023-08-20 DIAGNOSIS — Z794 Long term (current) use of insulin: Secondary | ICD-10-CM | POA: Diagnosis not present

## 2023-08-20 DIAGNOSIS — E042 Nontoxic multinodular goiter: Secondary | ICD-10-CM | POA: Diagnosis not present

## 2023-08-20 DIAGNOSIS — R2681 Unsteadiness on feet: Secondary | ICD-10-CM

## 2023-08-20 DIAGNOSIS — E0590 Thyrotoxicosis, unspecified without thyrotoxic crisis or storm: Secondary | ICD-10-CM | POA: Diagnosis not present

## 2023-08-20 DIAGNOSIS — Z7985 Long-term (current) use of injectable non-insulin antidiabetic drugs: Secondary | ICD-10-CM

## 2023-08-20 MED ORDER — FREESTYLE LIBRE 3 PLUS SENSOR MISC: 1.0000 | 3 refills | Status: DC

## 2023-08-20 MED ORDER — BASAGLAR KWIKPEN 100 UNIT/ML ~~LOC~~ SOPN: 24.0000 [IU] | PEN_INJECTOR | Freq: Every day | SUBCUTANEOUS | Status: DC

## 2023-08-20 NOTE — Patient Instructions (Addendum)
Please increase: - Basaglar 24 units in am  Try to change from Trulicity to: - Ozempic 0.5 mg weekly   Please stay off methimazole.   Please stop at the lab.  Please start the sensor. First download the [employer] 3 app and follow instructions.   Please return in 3-4 months with your sugar log.

## 2023-08-20 NOTE — Telephone Encounter (Signed)
Patient completed patient assistance for [employer] - paperwork put in provider folder at front desk

## 2023-08-20 NOTE — Progress Notes (Signed)
00. Patient ID: Bridget Butler, female   DOB: 1946-01-06, 77 y.o.   MRN: [id number]   HPI  Bridget Butler is a 77 y.o.-year-old female, initially referred by her PCP, Dr. Clelia Croft, returning for follow-up for toxic thyroid adenomas with subclinical thyrotoxicosis and now also uncontrolled DM2, insulin-dependent, with complications (CKD, DR, PN).  Last visit 3 months ago.  Interim history: No increased urination, nausea, chest pain.  1.5 months ago, she came off metformin and sugars increased.   Toxic thyroid nodules:  Reviewed history: She was diagnosed with hyperthyroidism approximately 2010. She was seeing Dr. Margaretmary Bayley - he retired. She was started on medication (cannot remember name) >> felt "off" >> stopped in ~2010.  I recommended to add methimazole 2.5 mg in 08/2022 but taking it inconsistently. In 01/2023, I advised her to take 5 mg every other day consistently. She took 2.5 mg daily as she had them already cut, but ran out sometime ago, cannot remember how long... Since then, we restarted methimazole, latest recommendation being to increase the dose to 5 mg daily (04/2023). At the visit with PCP in 07/2023, she reported not taking the methimazole and she was not aware of her thyroid diagnosis...  Reviewed her TFTs: Lab Results  Component Value Date   TSH 0.36 (L) 07/28/2023   TSH 0.20 (L) 05/04/2023   TSH 0.31 (L) 02/12/2023   TSH 0.23 (L) 12/30/2022   TSH 0.20 (L) 09/01/2022   TSH 0.36 12/31/2021   TSH 0.38 01/14/2021   TSH 0.57 07/18/2020   TSH 0.148 (L) 06/04/2020   TSH 0.03 (L) 04/11/2019   FREET4 0.72 05/04/2023   FREET4 0.72 02/12/2023   FREET4 0.90 09/01/2022   FREET4 0.90 01/14/2021   FREET4 0.66 07/18/2020   FREET4 0.93 06/08/2020   FREET4 0.84 04/11/2019   FREET4 0.97 10/29/2018   FREET4 1.05 07/08/2018   T3FREE 3.5 05/04/2023   T3FREE 3.0 02/12/2023   T3FREE 3.3 09/01/2022   T3FREE 3.0 01/14/2021   T3FREE 3.1 07/18/2020   T3FREE 3.0 04/11/2019   T3FREE  3.8 10/29/2018   T3FREE 3.3 07/08/2018   Her TSI antibodies were not elevated: Lab Results  Component Value Date   TSI <89 10/29/2018    Thyroid uptake (08/28/2007):  There is heterogeneous uptake within a normal sized gland. There are regions of photopenia within the bilateral upper poles and in the left lower pole. The most intense uptake is in the mid pole of the right lobe of the thyroid gland. Decreased uptake in the inferior pole of the right lobe of the thyroid gland additionally.   Calculated 24-hour I-131 uptake is equal to 25.0%, which is normal (normal 10-30%).   IMPRESSION:   1. Heterogeneous uptake within the thyroid gland could represent resolving subacute thyroiditis in patient with a normal uptake.  2. Cannot exclude a multinodular goiter if TSH continues to remain depressed  We obtained a thyroid uptake and scan (11/30/2018): Normal uptake but multinodular goiter with warm nodules at inferior thyroid poles:  4 hour I-123 uptake = 8.6% (normal 5-20%) 24 hour I-123 uptake = 25.3% (normal 10-30%)   IMPRESSION: Normal 4 hour and 24 hour radio iodine uptakes.   Multinodular thyroid gland with warm nodules at the inferior poles of both thyroid lobes, corresponding to dominant masses identified on a remote thyroid ultrasound consistent with adenomas.  No FH of thyroid disease or thyroid cancer. No h/o radiation tx to head or neck. No herbal supplements. No Biotin use.   Multinodular  goiter per ultrasound from 06/28/2013.   Biopsies were benign on 07/13/2013.  Pt denies: - feeling nodules in neck - hoarseness - dysphagia - choking  DM2: -Diagnosed: 9092 - insulin started: ~2006  Reviewed history: Her diabetes became uncontrolled after her first laryngeal surgery in 03/2020.  She was on steroids at the time of her surgery.  Reviewed HbA1c levels: Lab Results  Component Value Date   HGBA1C 9.6 (H) 07/28/2023   HGBA1C 8.2 (H) 12/30/2022   HGBA1C 7.7 (A) 09/01/2022    HGBA1C 7.2 (A) 05/01/2022   HGBA1C 7.7 (H) 12/31/2021   HGBA1C 7.5 (A) 09/09/2021   HGBA1C 7.7 (H) 05/01/2021   HGBA1C 7.5 (A) 01/14/2021   HGBA1C 7.7 (A) 10/15/2020   HGBA1C 9.1 (A) 08/23/2020   She is on: -  >> stopped 6 weeks ago 2/2 AP/D -  not taking - Trulicity 0.75 >> 1.5 >> 3 x1 mo  -gets these through patient assistance  - Basaglar 16 >> 14 >> 16 >> 14-16 >> 16 >> 18 >> 20 units in a.m.  She had GI intolerance with regular Metformin. Tried [place] >> $$$. She was previously on Janumet.  Pt checks her sugars once a day: - am:  90s-109, 200 >> 98, 112-161, 183, 202 >> 94-148 >> 102-161 >> 140-200 - 2h after b'fast: n/c - before lunch: n/c >> 154 >> n/c - 2h after lunch: 154, 159 >> 83-216, 229 >> n/c >> 154 >> n/c - before dinner: n/c >> 211 >> n/c - 2h after dinner: n/c - bedtime: 252 >> 133-229, 237 >> n/c >> 128-282, 305 >> n/c - nighttime: n/c Lowest sugar was 63 >> .[name] Kitchen..  102 >> 140; she has hypoglycemia awareness at 70.  Highest sugar was 400s >> .[name] Kitchen.305 >> 186 >> 161 >> 200s.  Glucometer: True Metrix  Pt's meals are: - Breakfast: eggs, [place] sausage or oatmeal >> coffee  - Lunch: salad + eggs + [place] meat (no pork) >> sandwich - Dinner: veggies + chicken/turkey and fish - Snacks: not after sx  -+ Mild CKD, last BUN/creatinine:  Lab Results  Component Value Date   BUN 13 07/28/2023   BUN 13 02/18/2023   CREATININE 1.14 07/28/2023   CREATININE 1.10 02/18/2023   Lab Results  Component Value Date   MICRALBCREAT 21.4 07/28/2023   MICRALBCREAT 26.3 12/30/2022   MICRALBCREAT 1.6 05/01/2021   MICRALBCREAT 7 06/03/2019   MICRALBCREAT 14.1 03/29/2018  On losartan 50.  Chauncey Mann was suggested by PCP in 07/2023, but she mentions that this was too expensive and did not start.  -+ HL; last set of lipids: Lab Results  Component Value Date   CHOL 207 (H) 07/28/2023   HDL 72.30 07/28/2023   LDLCALC 99 07/28/2023   TRIG 178.0 (H) 07/28/2023    CHOLHDL 3 07/28/2023  On pravastatin 40.  - last eye exam was on 06/24/2023: + DR, stable reportedly. Sees retina specialist. Dr. Stephannie Li.  -+ Improved numbness and tingling in her feet.  Last foot exam 12/30/2022.  On ASA 81  Pt has FH of DM in mother.  She also has a history of GERD, osteopenia, microlaryngoscopy for subglottic stenosis 06 and 06/2020.  ROS:  + see HPI  I reviewed pt's medications, allergies, PMH, social hx, family hx, and changes were documented in the history of present illness. Otherwise, unchanged from my initial visit note.  Past Medical History:  Diagnosis Date   Cataract    Diabetes mellitus without complication (HCC)  Dyspnea    on exertion   GERD (gastroesophageal reflux disease)    Hyperlipidemia    Hypertension    Multiple thyroid nodules    Thyroid disease    Past Surgical History:  Procedure Laterality Date   CHOLECYSTECTOMY     COSMETIC SURGERY Bilateral    eyelids   EYE SURGERY     GALLBLADDER SURGERY     MICROLARYNGOSCOPY WITH CO2 LASER AND EXCISION OF VOCAL CORD LESION N/A 04/02/2020   Procedure: MICROLARYNGOSCOPY WITH DILATION AND CO2 LASER AND EXCISION OF VOCAL CORD LESION w/MITOMYCIN C;  Surgeon: Christia Reading, MD;  Location: [facility] OR;  Service: ENT;  Laterality: N/A;   MICROLARYNGOSCOPY WITH DILATION N/A 05/23/2020   Procedure: MICROLARYNGOSCOPY W/ DILATION AND JET VENTILATION;  Surgeon: Christia Reading, MD;  Location: [facility] OR;  Service: ENT;  Laterality: N/A;   MICROLARYNGOSCOPY WITH DILATION N/A 07/13/2020   Procedure: MICROLARYNGOSCOPY with Jet Ventilation;  Surgeon: Christia Reading, MD;  Location: [facility] OR;  Service: ENT;  Laterality: N/A;   TRACHEOSTOMY TUBE PLACEMENT N/A 04/02/2020   Procedure: Awake TRACHEOSTOMY;  Surgeon: Christia Reading, MD;  Location: [facility] OR;  Service: ENT;  Laterality: N/A;   TUBAL LIGATION     Social History   Socioeconomic History   Marital status: Widowed    Spouse name: Not on file   Number of children: 3    Years of education: 66   Highest education level: Not on file  Occupational History   Not on file  Tobacco Use   Smoking status: Never   Smokeless tobacco: Never  Vaping Use   Vaping status: Never Used  Substance and Sexual Activity   Alcohol use: No   Drug use: No   Sexual activity: Not Currently    Partners: Male  Other Topics Concern   Not on file  Social History Narrative   Mrs Ostwald is a 77 year old Jehovah witness patient    She receives support from her daughter primarily who also assists with transportation to medical appointments.    She is presently independent with her ADLs and iADLs      Social Determinants of Health   Financial Resource Strain: Low Risk  (09/11/2021)   Overall Financial Resource Strain (CARDIA)    Difficulty of Paying Living Expenses: Not hard at all  Food Insecurity: No Food Insecurity (09/15/2022)   Hunger Vital Sign    Worried About Running Out of Food in the Last Year: Never true    Ran Out of Food in the Last Year: Never true  Transportation Needs: No Transportation Needs (09/15/2022)   PRAPARE - Administrator, Civil Service (Medical): No    Lack of Transportation (Non-Medical): No  Physical Activity: Sufficiently Active (09/15/2022)   Exercise Vital Sign    Days of Exercise per Week: 7 days    Minutes of Exercise per Session: 30 min  Stress: No Stress Concern Present (09/15/2022)   [employer] of Occupational Health - Occupational Stress Questionnaire    Feeling of Stress : Not at all  Social Connections: Moderately Integrated (09/15/2022)   Social Connection and Isolation Panel [NHANES]    Frequency of Communication with Friends and Family: More than three times a week    Frequency of Social Gatherings with Friends and Family: More than three times a week    Attends Religious Services: More than 4 times per year    Active Member of [employer] or Organizations: Yes    Attends Banker Meetings:  More than 4  times per year    Marital Status: Widowed  Intimate Partner Violence: Not At Risk (09/15/2022)   Humiliation, Afraid, Rape, and Kick questionnaire    Fear of Current or Ex-Partner: No    Emotionally Abused: No    Physically Abused: No    Sexually Abused: No   Current Outpatient Medications on File Prior to Visit  Medication Sig Dispense Refill   Blood Glucose Monitoring Suppl (TRUE METRIX METER) DEVI 1 Device by Does not apply route 2 (two) times daily. 1 each 2   Cholecalciferol 50 MCG (2000 UT) TABS Take 1 tablet (2,000 Units total) by mouth daily. (Patient not taking: Reported on 08/06/2023) 90 tablet 1   Dulaglutide (TRULICITY) 3 MG/0.5ML SOPN Inject 3 mg into the skin once a week.     Finerenone (KERENDIA) 10 MG TABS Take 1 tablet (10 mg total) by mouth daily. (Patient not taking: Reported on 08/06/2023) 90 tablet 0   glucose blood (TRUE METRIX BLOOD GLUCOSE TEST) test strip 1 each by Other route 2 (two) times daily. USE AS INSTRUCTED TO CHECK BLOOD SUGAR ONE TIME DAILY 100 strip 4   insulin glargine, 2 Unit Dial, (TOUJEO MAX SOLOSTAR) 300 UNIT/ML Solostar Pen Inject 30 Units into the skin daily. (Patient taking differently: Inject 17 Units into the skin daily.) 9 mL 1   Insulin Pen Needle 32G X 6 MM MISC 1 Act by Does not apply route once a week. 100 each 1   meclizine (ANTIVERT) 12.5 MG tablet Take 1 tablet (12.5 mg total) by mouth 3 (three) times daily as needed for dizziness. 30 tablet 1   methimazole (TAPAZOLE) 5 MG tablet Take 1 tablet (5 mg total) by mouth daily. 45 tablet 3   Multiple Vitamin (MULTIVITAMIN WITH MINERALS) TABS tablet Take 1 tablet by mouth daily. (Patient not taking: Reported on 08/06/2023)     olmesartan (BENICAR) 20 MG tablet Take 1 tablet (20 mg total) by mouth daily. 90 tablet 1   omeprazole (PRILOSEC) 40 MG capsule TAKE 1 CAPSULE BY MOUTH TWICE DAILY --  IN  THE  MORNING  AND  AT  BEDTIME (Patient taking differently: Take 40 mg by mouth daily. TAKE 1 CAPSULE BY  MOUTH TWICE DAILY --  IN  THE  MORNING  AND  AT  BEDTIME) 180 capsule 0   potassium chloride (KLOR-CON 10) 10 MEQ tablet Take 1 tablet (10 mEq total) by mouth 2 (two) times daily. 180 tablet 0   pravastatin (PRAVACHOL) 40 MG tablet Take 1 tablet (40 mg total) by mouth at bedtime. 90 tablet 0   repaglinide (PRANDIN) 1 MG tablet Take 1 tablet (1 mg total) by mouth daily before supper. (Patient not taking: Reported on 08/06/2023) 90 tablet 3   No current facility-administered medications on file prior to visit.   Allergies  Allergen Reactions   Metformin And Related Diarrhea   Family History  Problem Relation Age of Onset   Heart disease Mother    Hypertension Mother    Cancer Sister    Diabetes Sister    PE: BP 120/68   Pulse 78   Ht 5\' 3"  (1.6 m)   Wt 187 lb 6.4 oz (85 kg)   SpO2 97%   BMI 33.20 kg/m   Wt Readings from Last 10 Encounters:  08/20/23 187 lb 6.4 oz (85 kg)  07/28/23 188 lb (85.3 kg)  07/01/23 191 lb (86.6 kg)  05/04/23 190 lb (86.2 kg)  02/18/23 186 lb (84.4 kg)  01/01/23 189 lb (85.7 kg)  12/30/22 186 lb (84.4 kg)  09/01/22 186 lb 9.6 oz (84.6 kg)  05/01/22 191 lb 3.2 oz (86.7 kg)  02/04/22 191 lb (86.6 kg)   Constitutional: overweight, in NAD Eyes: EOMI, no exophthalmos ENT: no thyromegaly, no thyroid masses palpated, no cervical lymphadenopathy Cardiovascular: RRR, No MRG, +B LE edema Respiratory: CTA B Musculoskeletal: no deformities Skin: no rashes Neurological: no tremor with outstretched hands  ASSESSMENT: 1. DM2-insulin dependent, uncontrolled, with complications - CKD - DR - PN  2. Thyrotoxic adenomas  3. Multiple thyroid nodules  PLAN:  1. DM2 -Patient with longstanding type 2 diabetes, on long-acting insulin and weekly GLP-1 receptor agonist, with still poor control.  At last visit, she was on metformin but was taking it inconsistently due to GI symptoms.  We discussed about possibly switching to a meglitinide but she did not start  repaglinide.  I reviewed the PCP note from earlier this month and at that time she was apparently off the insulin.  HbA1c was high, at 9.6%, increased from 8.2%.  He advised her to restart insulin.  However, at today's visit he tells me that she was not off insulin at the time of the visit, but she had stopped metformin when completely due to abdominal pain and diarrhea. -At today's visit, sugars are higher, all above target in the morning and she is not checking around later in the day.  We discussed again about the importance of checking sugars later in the day, also, rotating check times.  I suggested a CGM.  She does have a smart phone but is not sure how to download the correct application so she will ask her daughter for help.  For now, I advised her to increase the dose of her basal insulin and, also, per her request, we will try to switch from Trulicity to Ozempic, for stronger effect on blood sugars.  I gave her PAP forms to fill out so that we can send to [employer].  In the meantime, we will continue with Trulicity. - I advised the pt to: Patient Instructions  Please increase: - Basaglar 24 units in am  Try to change from Trulicity to: - Ozempic 0.5 mg weekly   Please stay off methimazole.   Please stop at the lab.  Please start the sensor. First download the [employer] 3 app and follow instructions.   Please return in 3-4 months with your sugar log.    - advised to check sugars at different times of the day - 1x a day, rotating check times - advised for yearly eye exams >> she is UTD - return to clinic in 3-4 months  2.  Patient with a long history of low TSH without thyrotoxic symptoms: No Weight loss, heat intolerance, hyperdefecation, palpitations, anxiety -Her thyroid uptake and scan showed toxic adenomas in the inferior thyroid lobes -We did not have to intervene in the past that she only had a mildly suppressed TSH with normal free thyroid hormones, but the 03/2019  TSH was quite low, and 0.03 and we discussed about RAI treatment.  However, TFTs improved afterwards.  In 2023, as the TSH was still suppressed, I suggested to add a low-dose methimazole, 2.5 mg daily.  She was taking the medication inconsistently in the past and she was off at last visit.  We restarted methimazole and I asked her to increase the dose to 5 mg daily in 04/2023.  -At last check, her TSH was only slightly  low so we I advised her to continue the same dose of methimazole: Lab Results  Component Value Date   TSH 0.36 (L) 07/28/2023  -At last visit with PCP from 07/2023, she was not taking the methimazole, however... At today's visit, she tells me that she ran out 1.5 months ago.  I am not sure why she ran out.  Reviewing her prescription history, she should have had plenty of methimazole, but she just had to go to the pharmacy and pick up another refill.  I strongly advised her not to run out of the medication in the future.  However, for now, I advised her not to restart it right away, until we get thyroid tests today. -No thyrotoxic signs or symptoms now.  She lost 4 pounds since last visit, but no tremors or palpitations. -Will go to recheck her TFTs today, and see if we need to restart methimazole  3. Multiple thyroid nodules -Denies neck compression symptoms and no masses were felt on palpation of her neck today -2 of the nodules were biopsied with benign results in 2014 -Reviewing the thyroid uptake and scan report from 2020, she had more nodules in the lower poles of the thyroid, without indication for biopsy -We are managing her conservatively  Carlus Pavlov, MD PhD [facility] Endocrinology

## 2023-08-20 NOTE — Therapy (Signed)
OUTPATIENT PHYSICAL THERAPY VESTIBULAR EVALUATION     Patient Name: Bridget Butler MRN: [id number] DOB:[birth year], 77 y.o., female Today's Date: 08/20/2023  END OF SESSION:  PT End of Session - 08/20/23 1455     Visit Number 2    Number of Visits 7    Date for PT Re-Evaluation 09/18/23    Authorization Type [employer]    PT Start Time 1450    PT Stop Time 1530    PT Time Calculation (min) 40 min    Activity Tolerance Patient tolerated treatment well    Behavior During Therapy WFL for tasks assessed/performed;Flat affect             Past Medical History:  Diagnosis Date   Cataract    Diabetes mellitus without complication (HCC)    Dyspnea    on exertion   GERD (gastroesophageal reflux disease)    Hyperlipidemia    Hypertension    Multiple thyroid nodules    Thyroid disease    Past Surgical History:  Procedure Laterality Date   CHOLECYSTECTOMY     COSMETIC SURGERY Bilateral    eyelids   EYE SURGERY     GALLBLADDER SURGERY     MICROLARYNGOSCOPY WITH CO2 LASER AND EXCISION OF VOCAL CORD LESION N/A 04/02/2020   Procedure: MICROLARYNGOSCOPY WITH DILATION AND CO2 LASER AND EXCISION OF VOCAL CORD LESION w/MITOMYCIN C;  Surgeon: Christia Reading, MD;  Location: [facility] OR;  Service: ENT;  Laterality: N/A;   MICROLARYNGOSCOPY WITH DILATION N/A 05/23/2020   Procedure: MICROLARYNGOSCOPY W/ DILATION AND JET VENTILATION;  Surgeon: Christia Reading, MD;  Location: [facility] OR;  Service: ENT;  Laterality: N/A;   MICROLARYNGOSCOPY WITH DILATION N/A 07/13/2020   Procedure: MICROLARYNGOSCOPY with Jet Ventilation;  Surgeon: Christia Reading, MD;  Location: [facility] OR;  Service: ENT;  Laterality: N/A;   TRACHEOSTOMY TUBE PLACEMENT N/A 04/02/2020   Procedure: Awake TRACHEOSTOMY;  Surgeon: Christia Reading, MD;  Location: [facility] OR;  Service: ENT;  Laterality: N/A;   TUBAL LIGATION     Patient Active Problem List   Diagnosis Date Noted   Age-related osteoporosis without current pathological fracture 08/15/2023    Type 2 diabetes mellitus with stage 3b chronic kidney disease, with long-term current use of insulin (HCC) 07/29/2023   Low serum thyroid stimulating hormone (TSH) 07/28/2023   Anemia, chronic disease 02/18/2023   Hypertension associated with diabetes (HCC) 12/31/2022   Bilateral hearing loss 12/31/2021   Encounter for general adult medical examination with abnormal findings 05/06/2021   Stage 3a chronic kidney disease (HCC) 05/01/2021   Visit for screening mammogram 05/01/2021   Vitamin D deficiency 02/26/2021   Carpal tunnel syndrome of right wrist 02/26/2021   Class 2 severe obesity due to excess calories with serious comorbidity and body mass index (BMI) of 35.0 to 35.9 in adult (HCC) 03/29/2018   Insulin-requiring or dependent type II diabetes mellitus (HCC) 01/31/2018   Essential hypertension 01/31/2018   Hyperlipidemia LDL goal <100 01/31/2018   Gastroesophageal reflux disease 01/31/2018   Osteopenia of multiple sites 12/21/2015    PCP: Etta Grandchild, MD REFERRING PROVIDER: Corwin Levins, MD  REFERRING DIAG: R42 (ICD-10-CM) - Vertigo  THERAPY DIAG:  Dizziness and giddiness  Unsteadiness on feet  ONSET DATE: 07/01/2023 (referral date)  Rationale for Evaluation and Treatment: Rehabilitation  SUBJECTIVE:   SUBJECTIVE STATEMENT: Patient reports that she is overall feeling better since she was last here. She had some mild dizziness in the morning but overall has been better. She has followed  up with endo necrology about elevated glucose readings around 200. Denies dizziness currently. Has not been called about bone density results at this time.   Pt accompanied by: self  PERTINENT HISTORY: diabetes controlled on insulin, hypertension, macular degeneration on L eye, ptosis of R eye (chronic), thyroid disease, osteoporosis noted since last visit    PAIN:  Are you having pain? No  PRECAUTIONS: Fall  RED FLAGS: None   WEIGHT BEARING RESTRICTIONS: No  FALLS: Has  patient fallen in last 6 months? Yes. Number of falls 1 - tripped over gait in flower bed, able to get up by self  LIVING ENVIRONMENT: Lives with: lives with their family Lives in: House/apartment Stairs: Yes: External: 2 steps; can reach both Has following equipment at home: Grab bars  PLOF: Independent and Independent with basic ADLs  PATIENT GOALS: "To get rid of vertigo."   OBJECTIVE:  Note: Objective measures were completed at Evaluation unless otherwise noted.  DIAGNOSTIC FINDINGS: No relevant recent imaging   COGNITION: Overall cognitive status: Within functional limits for tasks assessed   VESTIBULAR TREATMENT:     TherAct (BP check as initial BP reading on low side) Vitals:   08/20/23 1501  BP: (!) 108/57  Pulse: 87   Orthostatics (taken on R arm due to access): Supine after 3 minutes: 143/74 mmHg, 89 bpm Seated EOM: 126/63 mmHg, 90 bpm - reports dizziness when coming to stand Standing immediately: 124/55 mmHg, 89 bpm - denies dizziness Standing after 2 minutes: 140/65 mmHg, 87 bpm           Discussed management of BP drops with arm pumps and leg pumps; discussed that patient could follow up with PCP as well.   NMR: Modified positional testing as osteoporosis found since last seen  R sidelying modified test for R posterior canal BPPV: performed 4x and small amplitude R torsional upbeat nystagmus on R side, limited ability viewing due to ptosis on R eye, patient reported dizziness with 2 of 4 attempts, latency < 5 seconds and duration ~ 5 seconds  L sidelying modified test for L posterior canal BPPV: performed 3x and clear                                                                     PATIENT EDUCATION: Education details: POC + examination findings  Person educated: Patient Education method: Explanation Education comprehension: verbalized understanding and needs further education  HOME EXERCISE PROGRAM:  To be provided   GOALS: Goals reviewed with  patient? Yes  LONG TERM GOALS: Target date: 09/18/2023 (STG = LTG due to POC length)   Patient will report demonstrate independence with final HEP in order to maintain current gains and continue to progress after physical therapy discharge.   Baseline: To be provided  Goal status: INITIAL  2.  MSQ to finish being assessed / LTG written as indicated Baseline: worst thus far 2/5 Goal status: INITIAL  3.  SOT to be assessed / LTG written Baseline: To be assessed  Goal status: INITIAL  4.  Patient will improve FOTO score to 63 to achieve predicted improvements in functional mobility due to skilled physical therapy interventions to increase safety with and participation in daily activities.  Baseline: 62 Goal status: INITIAL  ASSESSMENT:  CLINICAL IMPRESSION: Skilled PT session emphasized assessment of orthostatics and modified positional testing as patient found to have osteoporosis since last seen. Patient presents with drops in BP consistent with orthostatic hypotension likely contributing to dizziness when coming to sit. Secondary finding of mild R posterior canalithiasis with duration < 5 seconds also likely contributor. Recommend modified treatment in future sessions; unable to perform in today's session due to time contraint and likely need for +2 to assist with maneuver. Continue POC.    OBJECTIVE IMPAIRMENTS: decreased balance and dizziness.   ACTIVITY LIMITATIONS:  balance, looking up, laying back in bed, getting up in the morning   PARTICIPATION LIMITATIONS: cleaning and community activity  PERSONAL FACTORS: 3+ comorbidities: see above  are also affecting patient's functional outcome.   REHAB POTENTIAL: Good  CLINICAL DECISION MAKING: Stable/uncomplicated  EVALUATION COMPLEXITY: Low   PLAN:  PT FREQUENCY: 2x/week  PT DURATION: 3 weeks  PLANNED INTERVENTIONS: 97164- PT Re-evaluation, 97110-Therapeutic exercises, 97530- Therapeutic activity, 97112- Neuromuscular  re-education, 97535- Self Care, 28413- Manual therapy, [id number]- Gait training, and [phone]- Canalith repositioning  PLAN FOR NEXT SESSION: finish MSQ perform SOT and update LTGs as needed, create initial HEP working on components of SOT or components of MSQ as needed   Recheck R posterior canal BPPV with modified positional testing - treat with Semont mauever as indicated  Carmelia Bake, PT, DPT 08/20/2023, 3:49 PM

## 2023-08-21 ENCOUNTER — Other Ambulatory Visit: Payer: Medicare PPO

## 2023-08-24 ENCOUNTER — Telehealth: Payer: Self-pay | Admitting: Pharmacy Technician

## 2023-08-24 NOTE — Telephone Encounter (Signed)
Application was faxed back on 08/21/23

## 2023-08-24 NOTE — Telephone Encounter (Signed)
-----   Message from Sanda Linger sent at 08/22/2023 11:09 AM EDT ----- Will you see if she can start prolia?

## 2023-08-24 NOTE — Telephone Encounter (Signed)
Prolia VOB initiated via [email or web address] 

## 2023-08-25 ENCOUNTER — Other Ambulatory Visit: Payer: Self-pay

## 2023-08-25 ENCOUNTER — Encounter: Payer: Self-pay | Admitting: Physical Therapy

## 2023-08-25 ENCOUNTER — Ambulatory Visit: Payer: Medicare PPO | Attending: Internal Medicine | Admitting: Physical Therapy

## 2023-08-25 VITALS — BP 127/55 | HR 89

## 2023-08-25 DIAGNOSIS — R2681 Unsteadiness on feet: Secondary | ICD-10-CM | POA: Diagnosis not present

## 2023-08-25 DIAGNOSIS — R42 Dizziness and giddiness: Secondary | ICD-10-CM | POA: Diagnosis not present

## 2023-08-25 MED ORDER — TRULICITY 3 MG/0.5ML ~~LOC~~ SOAJ: 3.0000 mg | SUBCUTANEOUS | 3 refills | Status: DC

## 2023-08-25 NOTE — Therapy (Signed)
OUTPATIENT PHYSICAL THERAPY VESTIBULAR TREATMENT     Patient Name: Bridget Butler MRN: [id number] DOB:09-08-1946, 77 y.o., female Today's Date: 08/25/2023  END OF SESSION:  PT End of Session - 08/25/23 1321     Visit Number 3    Number of Visits 7    Date for PT Re-Evaluation 09/18/23    Authorization Type [employer]    PT Start Time 1322    PT Stop Time 1355    PT Time Calculation (min) 33 min    Equipment Utilized During Treatment Other (comment)    Activity Tolerance Patient tolerated treatment well    Behavior During Therapy WFL for tasks assessed/performed;Flat affect             Past Medical History:  Diagnosis Date   Cataract    Diabetes mellitus without complication (HCC)    Dyspnea    on exertion   GERD (gastroesophageal reflux disease)    Hyperlipidemia    Hypertension    Multiple thyroid nodules    Thyroid disease    Past Surgical History:  Procedure Laterality Date   CHOLECYSTECTOMY     COSMETIC SURGERY Bilateral    eyelids   EYE SURGERY     GALLBLADDER SURGERY     MICROLARYNGOSCOPY WITH CO2 LASER AND EXCISION OF VOCAL CORD LESION N/A 04/02/2020   Procedure: MICROLARYNGOSCOPY WITH DILATION AND CO2 LASER AND EXCISION OF VOCAL CORD LESION w/MITOMYCIN C;  Surgeon: Christia Reading, MD;  Location: [facility] OR;  Service: ENT;  Laterality: N/A;   MICROLARYNGOSCOPY WITH DILATION N/A 05/23/2020   Procedure: MICROLARYNGOSCOPY W/ DILATION AND JET VENTILATION;  Surgeon: Christia Reading, MD;  Location: [facility] OR;  Service: ENT;  Laterality: N/A;   MICROLARYNGOSCOPY WITH DILATION N/A 07/13/2020   Procedure: MICROLARYNGOSCOPY with Jet Ventilation;  Surgeon: Christia Reading, MD;  Location: [facility] OR;  Service: ENT;  Laterality: N/A;   TRACHEOSTOMY TUBE PLACEMENT N/A 04/02/2020   Procedure: Awake TRACHEOSTOMY;  Surgeon: Christia Reading, MD;  Location: [facility] OR;  Service: ENT;  Laterality: N/A;   TUBAL LIGATION     Patient Active Problem List   Diagnosis Date Noted   Age-related  osteoporosis without current pathological fracture 08/15/2023   Type 2 diabetes mellitus with stage 3b chronic kidney disease, with long-term current use of insulin (HCC) 07/29/2023   Low serum thyroid stimulating hormone (TSH) 07/28/2023   Anemia, chronic disease 02/18/2023   Hypertension associated with diabetes (HCC) 12/31/2022   Bilateral hearing loss 12/31/2021   Encounter for general adult medical examination with abnormal findings 05/06/2021   Stage 3a chronic kidney disease (HCC) 05/01/2021   Visit for screening mammogram 05/01/2021   Vitamin D deficiency 02/26/2021   Carpal tunnel syndrome of right wrist 02/26/2021   Class 2 severe obesity due to excess calories with serious comorbidity and body mass index (BMI) of 35.0 to 35.9 in adult (HCC) 03/29/2018   Insulin-requiring or dependent type II diabetes mellitus (HCC) 01/31/2018   Essential hypertension 01/31/2018   Hyperlipidemia LDL goal <100 01/31/2018   Gastroesophageal reflux disease 01/31/2018   Osteopenia of multiple sites 12/21/2015    PCP: Etta Grandchild, MD REFERRING PROVIDER: Corwin Levins, MD  REFERRING DIAG: R42 (ICD-10-CM) - Vertigo  THERAPY DIAG:  Dizziness and giddiness  Unsteadiness on feet  ONSET DATE: 07/01/2023 (referral date)  Rationale for Evaluation and Treatment: Rehabilitation  SUBJECTIVE:   SUBJECTIVE STATEMENT: Patient continues to report room spinning dizziness when she gets up in the morning. Patient denies an falls/near falls since  last here.   Pt accompanied by: self  PERTINENT HISTORY: diabetes controlled on insulin, hypertension, macular degeneration on L eye, ptosis of R eye (chronic), thyroid disease, osteoporosis noted since last visit    PAIN:  Are you having pain? No  PRECAUTIONS: Fall  RED FLAGS: None   WEIGHT BEARING RESTRICTIONS: No  FALLS: Has patient fallen in last 6 months? Yes. Number of falls 1 - tripped over gait in flower bed, able to get up by self  LIVING  ENVIRONMENT: Lives with: lives with their family Lives in: House/apartment Stairs: Yes: External: 2 steps; can reach both Has following equipment at home: Grab bars  PLOF: Independent and Independent with basic ADLs  PATIENT GOALS: "To get rid of vertigo."   OBJECTIVE:  Note: Objective measures were completed at Evaluation unless otherwise noted.  DIAGNOSTIC FINDINGS: No relevant recent imaging   COGNITION: Overall cognitive status: Within functional limits for tasks assessed   VESTIBULAR TREATMENT:     TherAct (BP check as initial BP reading on low side) Vitals:   08/25/23 1326  BP: (!) 127/55  Pulse: 89   NMR: Modified positional testing as osteoporosis found since last seen  R sidelying modified test for R posterior canal BPPV: upbeating R torsional nystagmus with delay < 5 seconds and 5-8 second duration over course of session  Brandt Deroff maneuver: provided as HEP and verabally reviewed handout during session  Canalith repositioning maneuver:  (Semont maneuver for R posterior canalithiasis): Performed 4x during session, limited as only one therapist able to help during session, overall good speed with maneuver however still nystagmus noted with repeated testing, second therapist blocked for next session to assist with maneuver, unable to retest at end of session per patient request due to increase in nausea  PATIENT EDUCATION: Education details: Examination findings + POC moving forward + initial HEP Person educated: Patient Education method: Explanation Education comprehension: verbalized understanding and needs further education  HOME EXERCISE PROGRAM:  Brandt-Deroff 3-4x each day  GOALS: Goals reviewed with patient? Yes  LONG TERM GOALS: Target date: 09/18/2023 (STG = LTG due to POC length)   Patient will report demonstrate independence with final HEP in order to maintain current gains and continue to progress after physical therapy discharge.    Baseline: To be provided  Goal status: INITIAL  2.  MSQ to finish being assessed / LTG written as indicated Baseline: worst thus far 2/5 Goal status: INITIAL  3.  SOT to be assessed / LTG written Baseline: To be assessed  Goal status: INITIAL  4.  Patient will improve FOTO score to 63 to achieve predicted improvements in functional mobility due to skilled physical therapy interventions to increase safety with and participation in daily activities.  Baseline: 62 Goal status: INITIAL  ASSESSMENT:  CLINICAL IMPRESSION: Skilled PT session emphasized modified canalith repositioning maneuver with use of Semont for R posterior canalithiasis. Only one therapist able to assist with maneuver in today's session; speed and amplitude fair on maneuver but would benefit from second therapist to maximize speed. May benefit from other modified maneuver if still no resolution of symptoms moving forward. Further treatment held at end of session due to patient reporting minor nausea and request. Continue POC.    OBJECTIVE IMPAIRMENTS: decreased balance and dizziness.   ACTIVITY LIMITATIONS:  balance, looking up, laying back in bed, getting up in the morning   PARTICIPATION LIMITATIONS: cleaning and community activity  PERSONAL FACTORS: 3+ comorbidities: see above  are also affecting patient's functional outcome.  REHAB POTENTIAL: Good  CLINICAL DECISION MAKING: Stable/uncomplicated  EVALUATION COMPLEXITY: Low   PLAN:  PT FREQUENCY: 2x/week  PT DURATION: 3 weeks  PLANNED INTERVENTIONS: 97164- PT Re-evaluation, 97110-Therapeutic exercises, 97530- Therapeutic activity, 97112- Neuromuscular re-education, 97535- Self Care, 52841- Manual therapy, [id number]- Gait training, and [phone]- Canalith repositioning  PLAN FOR NEXT SESSION: finish MSQ perform SOT and update LTGs as needed, create initial HEP working on components of SOT or components of MSQ as needed   Recheck R posterior canal BPPV with  modified positional testing - treat with Semont mauever as indicated (recommend plus 2 for treatment)   Carmelia Bake, PT, DPT 08/25/2023, 3:15 PM

## 2023-08-25 NOTE — Telephone Encounter (Signed)
Rx has been faxed back to [place].

## 2023-08-27 ENCOUNTER — Ambulatory Visit: Payer: Medicare PPO

## 2023-08-27 ENCOUNTER — Telehealth: Payer: Self-pay

## 2023-08-27 NOTE — Progress Notes (Signed)
   Care Guide Note  08/27/2023 Name: Uva Runkel Adolph MRN: [id number] DOB: [birth year]  Referred by: Etta Grandchild, MD Reason for referral : Care Coordination (Outreach to reschedule with Pharm d )   Kimoni Pickerill Wixted is a 77 y.o. year old female who is a primary care patient of Etta Grandchild, MD. Felipa Evener Rajan was referred to the pharmacist for assistance related to DM.    An unsuccessful telephone outreach was attempted today to contact the patient who was referred to the pharmacy team for assistance with medication assistance. Additional attempts will be made to contact the patient.   Penne Lash, RMA Care Guide [facility]  [place], [place] [address] Direct Dial: [phone] [email or web address]

## 2023-08-28 ENCOUNTER — Encounter: Payer: Self-pay | Admitting: Physical Therapy

## 2023-08-28 ENCOUNTER — Ambulatory Visit: Payer: Medicare PPO | Admitting: Physical Therapy

## 2023-08-28 VITALS — BP 131/58 | HR 80

## 2023-08-28 DIAGNOSIS — R2681 Unsteadiness on feet: Secondary | ICD-10-CM

## 2023-08-28 DIAGNOSIS — R42 Dizziness and giddiness: Secondary | ICD-10-CM | POA: Diagnosis not present

## 2023-08-28 NOTE — Therapy (Signed)
OUTPATIENT PHYSICAL THERAPY VESTIBULAR TREATMENT     Patient Name: Bridget Butler MRN: [id number] DOB:1946/07/23, 77 y.o., female Today's Date: 08/28/2023  END OF SESSION:  PT End of Session - 08/28/23 1404     Visit Number 4    Number of Visits 7    Date for PT Re-Evaluation 09/18/23    Authorization Type [employer]    PT Start Time 1402    PT Stop Time 1432    PT Time Calculation (min) 30 min    Equipment Utilized During Treatment Other (comment)   mat level   Activity Tolerance Patient tolerated treatment well    Behavior During Therapy WFL for tasks assessed/performed;Flat affect             Past Medical History:  Diagnosis Date   Cataract    Diabetes mellitus without complication (HCC)    Dyspnea    on exertion   GERD (gastroesophageal reflux disease)    Hyperlipidemia    Hypertension    Multiple thyroid nodules    Thyroid disease    Past Surgical History:  Procedure Laterality Date   CHOLECYSTECTOMY     COSMETIC SURGERY Bilateral    eyelids   EYE SURGERY     GALLBLADDER SURGERY     MICROLARYNGOSCOPY WITH CO2 LASER AND EXCISION OF VOCAL CORD LESION N/A 04/02/2020   Procedure: MICROLARYNGOSCOPY WITH DILATION AND CO2 LASER AND EXCISION OF VOCAL CORD LESION w/MITOMYCIN C;  Surgeon: Christia Reading, MD;  Location: [facility] OR;  Service: ENT;  Laterality: N/A;   MICROLARYNGOSCOPY WITH DILATION N/A 05/23/2020   Procedure: MICROLARYNGOSCOPY W/ DILATION AND JET VENTILATION;  Surgeon: Christia Reading, MD;  Location: [facility] OR;  Service: ENT;  Laterality: N/A;   MICROLARYNGOSCOPY WITH DILATION N/A 07/13/2020   Procedure: MICROLARYNGOSCOPY with Jet Ventilation;  Surgeon: Christia Reading, MD;  Location: [facility] OR;  Service: ENT;  Laterality: N/A;   TRACHEOSTOMY TUBE PLACEMENT N/A 04/02/2020   Procedure: Awake TRACHEOSTOMY;  Surgeon: Christia Reading, MD;  Location: [facility] OR;  Service: ENT;  Laterality: N/A;   TUBAL LIGATION     Patient Active Problem List   Diagnosis Date Noted   Age-related  osteoporosis without current pathological fracture 08/15/2023   Type 2 diabetes mellitus with stage 3b chronic kidney disease, with long-term current use of insulin (HCC) 07/29/2023   Low serum thyroid stimulating hormone (TSH) 07/28/2023   Anemia, chronic disease 02/18/2023   Hypertension associated with diabetes (HCC) 12/31/2022   Bilateral hearing loss 12/31/2021   Encounter for general adult medical examination with abnormal findings 05/06/2021   Stage 3a chronic kidney disease (HCC) 05/01/2021   Visit for screening mammogram 05/01/2021   Vitamin D deficiency 02/26/2021   Carpal tunnel syndrome of right wrist 02/26/2021   Class 2 severe obesity due to excess calories with serious comorbidity and body mass index (BMI) of 35.0 to 35.9 in adult (HCC) 03/29/2018   Insulin-requiring or dependent type II diabetes mellitus (HCC) 01/31/2018   Essential hypertension 01/31/2018   Hyperlipidemia LDL goal <100 01/31/2018   Gastroesophageal reflux disease 01/31/2018   Osteopenia of multiple sites 12/21/2015    PCP: Etta Grandchild, MD REFERRING PROVIDER: Corwin Levins, MD  REFERRING DIAG: R42 (ICD-10-CM) - Vertigo  THERAPY DIAG:  Dizziness and giddiness  Unsteadiness on feet  ONSET DATE: 07/01/2023 (referral date)  Rationale for Evaluation and Treatment: Rehabilitation  SUBJECTIVE:   SUBJECTIVE STATEMENT: Patient reports that she was working on her exercises and seems to think they got better with  repetition but still has some room spinning. Denies other acute changes/falls or near falls.   Pt accompanied by: self  PERTINENT HISTORY: diabetes controlled on insulin, hypertension, macular degeneration on L eye, ptosis of R eye (chronic), thyroid disease, osteoporosis noted since last visit    PAIN:  Are you having pain? No  PRECAUTIONS: Fall  RED FLAGS: None   WEIGHT BEARING RESTRICTIONS: No  FALLS: Has patient fallen in last 6 months? Yes. Number of falls 1 - tripped over  gait in flower bed, able to get up by self  LIVING ENVIRONMENT: Lives with: lives with their family Lives in: House/apartment Stairs: Yes: External: 2 steps; can reach both Has following equipment at home: Grab bars  PLOF: Independent and Independent with basic ADLs  PATIENT GOALS: "To get rid of vertigo."   OBJECTIVE:  Note: Objective measures were completed at Evaluation unless otherwise noted.  DIAGNOSTIC FINDINGS: No relevant recent imaging   COGNITION: Overall cognitive status: Within functional limits for tasks assessed   VESTIBULAR TREATMENT:     TherAct (BP check as initial BP reading on low side) Vitals:   08/28/23 1408  BP: (!) 131/58  Pulse: 80    NMR: Modified positional testing as new history of osteoporosis, session performed with assistance of second therapist to help with trunk mobility during Semont manuever  R sidelying modified test for R posterior canal BPPV: upbeating R torsional nystagmus with delay ~3 seconds and 5-15 second duration over course of session  Canalith repositioning maneuver:  (Semont maneuver for R posterior canalithiasis): Performed 4x during session, major improvement in symptoms noted after first treatment, patient reporting mild dizziness in position 2 on first and final attempts of maneuver indicating crystals returning to place, did not get to retest at end of session due to patient tolerance  PATIENT EDUCATION: Education details: Examination findings + POC moving forward + continue HEP Person educated: Patient Education method: Explanation Education comprehension: verbalized understanding and needs further education  HOME EXERCISE PROGRAM:  Brandt-Deroff 3-4x each day  GOALS: Goals reviewed with patient? Yes  LONG TERM GOALS: Target date: 09/18/2023 (STG = LTG due to POC length)   Patient will report demonstrate independence with final HEP in order to maintain current gains and continue to progress after physical  therapy discharge.   Baseline: To be provided  Goal status: INITIAL  2.  MSQ to finish being assessed / LTG written as indicated Baseline: worst thus far 2/5 Goal status: INITIAL  3.  SOT to be assessed / LTG written Baseline: To be assessed  Goal status: INITIAL  4.  Patient will improve FOTO score to 63 to achieve predicted improvements in functional mobility due to skilled physical therapy interventions to increase safety with and participation in daily activities.  Baseline: 62 Goal status: INITIAL  ASSESSMENT:  CLINICAL IMPRESSION: Skilled PT session continued to emphasize use of the Semont maneuver as modified positional treatment for patient with R posterior canalithiasis and osteoporosis. Use of second therapist significantly helpful with maneuver and reduction in symptoms noted after first attempt. Mild residual R torsional upbeat nystagmus with short duration indicates small residual impairment that need to be addressed moving forward as indicated to fully resolve BPPV. Continue POC.    OBJECTIVE IMPAIRMENTS: decreased balance and dizziness.   ACTIVITY LIMITATIONS:  balance, looking up, laying back in bed, getting up in the morning   PARTICIPATION LIMITATIONS: cleaning and community activity  PERSONAL FACTORS: 3+ comorbidities: see above  are also affecting patient's functional outcome.  REHAB POTENTIAL: Good  CLINICAL DECISION MAKING: Stable/uncomplicated  EVALUATION COMPLEXITY: Low   PLAN:  PT FREQUENCY: 2x/week  PT DURATION: 3 weeks  PLANNED INTERVENTIONS: 97164- PT Re-evaluation, 97110-Therapeutic exercises, 97530- Therapeutic activity, 97112- Neuromuscular re-education, 97535- Self Care, 84132- Manual therapy, 97116- Gait training, and [id number]- Canalith repositioning  PLAN FOR NEXT SESSION: finish MSQ perform SOT and update LTGs as needed, create initial HEP working on components of SOT or components of MSQ as needed   Recheck R posterior canal BPPV with  modified positional testing - treat with Semont mauever as indicated (recommend plus 2 for treatment) or other modified positional treatment, recheck all canals at end, assess SOT as indicated  Carmelia Bake, PT, DPT 08/28/2023, 2:43 PM

## 2023-09-01 ENCOUNTER — Encounter: Payer: Medicare PPO | Admitting: Physical Therapy

## 2023-09-04 ENCOUNTER — Ambulatory Visit (INDEPENDENT_AMBULATORY_CARE_PROVIDER_SITE_OTHER): Payer: Medicare PPO

## 2023-09-04 VITALS — Ht 63.0 in | Wt 187.0 lb

## 2023-09-04 DIAGNOSIS — Z0000 Encounter for general adult medical examination without abnormal findings: Secondary | ICD-10-CM | POA: Diagnosis not present

## 2023-09-04 NOTE — Progress Notes (Signed)
Subjective:   Jamira Selner Grell is a 77 y.o. female who presents for Medicare Annual (Subsequent) preventive examination.  Visit Complete: Virtual I connected with  Jensen P Ghee on 09/04/23 by a audio enabled telemedicine application and verified that I am speaking with the correct person using two identifiers.  Patient Location: Home  Provider Location: Office/Clinic  I discussed the limitations of evaluation and management by telemedicine. The patient expressed understanding and agreed to proceed.  Vital Signs: Because this visit was a virtual/telehealth visit, some criteria may be missing or patient reported. Any vitals not documented were not able to be obtained and vitals that have been documented are patient reported.   Cardiac Risk Factors include: advanced age (>82men, >31 women);hypertension;diabetes mellitus;Other (see comment);dyslipidemia;obesity (BMI >30kg/m2), Risk factor comments: CKD     Objective:    Today's Vitals   09/04/23 1123  Weight: 187 lb (84.8 kg)  Height: 5\' 3"  (1.6 m)   Body mass index is 33.13 kg/m.     09/04/2023   11:28 AM 08/13/2023    2:05 PM 09/15/2022    9:31 AM 09/11/2021    1:00 PM 03/09/2021    6:15 PM 07/11/2020    1:41 PM 05/16/2020    2:19 PM  Advanced Directives  Does Patient Have a Medical Advance Directive? Yes Yes Yes Yes No No Yes  Type of Estate agent of [place];Living will Healthcare Power of [place];Living will Healthcare Power of [place];Living will    Healthcare Power of [employer];Living will  Does patient want to make changes to medical advance directive? No - Patient declined  No - Patient declined No - Patient declined     Copy of Healthcare Power of Attorney in Chart? Yes - validated most recent copy scanned in chart (See row information)  Yes - validated most recent copy scanned in chart (See row information)    Yes - validated most recent copy scanned in chart (See row information)  Would patient  like information on creating a medical advance directive?     No - Patient declined Yes (MAU/Ambulatory/Procedural Areas - Information given)     Current Medications (verified) Outpatient Encounter Medications as of 09/04/2023  Medication Sig   Blood Glucose Monitoring Suppl (TRUE METRIX METER) DEVI 1 Device by Does not apply route 2 (two) times daily.   Cholecalciferol 50 MCG (2000 UT) TABS Take 1 tablet (2,000 Units total) by mouth daily.   Continuous Glucose Sensor (FREESTYLE LIBRE 3 PLUS SENSOR) MISC 1 each by Does not apply route every 14 (fourteen) days.   Dulaglutide (TRULICITY) 3 MG/0.5ML SOAJ Inject 3 mg into the skin once a week.   Finerenone (KERENDIA) 10 MG TABS Take 1 tablet (10 mg total) by mouth daily.   glucose blood (TRUE METRIX BLOOD GLUCOSE TEST) test strip 1 each by Other route 2 (two) times daily. USE AS INSTRUCTED TO CHECK BLOOD SUGAR ONE TIME DAILY   Insulin Glargine (BASAGLAR KWIKPEN) 100 UNIT/ML Inject 24 Units into the skin daily.   Insulin Pen Needle 32G X 6 MM MISC 1 Act by Does not apply route once a week.   meclizine (ANTIVERT) 12.5 MG tablet Take 1 tablet (12.5 mg total) by mouth 3 (three) times daily as needed for dizziness.   methimazole (TAPAZOLE) 5 MG tablet Take 1 tablet (5 mg total) by mouth daily.   Multiple Vitamin (MULTIVITAMIN WITH MINERALS) TABS tablet Take 1 tablet by mouth daily.   olmesartan (BENICAR) 20 MG tablet Take 1 tablet (  20 mg total) by mouth daily.   omeprazole (PRILOSEC) 40 MG capsule TAKE 1 CAPSULE BY MOUTH TWICE DAILY --  IN  THE  MORNING  AND  AT  BEDTIME (Patient taking differently: Take 40 mg by mouth daily. TAKE 1 CAPSULE BY MOUTH TWICE DAILY --  IN  THE  MORNING  AND  AT  BEDTIME)   potassium chloride (KLOR-CON 10) 10 MEQ tablet Take 1 tablet (10 mEq total) by mouth 2 (two) times daily.   pravastatin (PRAVACHOL) 40 MG tablet Take 1 tablet (40 mg total) by mouth at bedtime.   repaglinide (PRANDIN) 1 MG tablet Take 1 tablet (1 mg total)  by mouth daily before supper.   No facility-administered encounter medications on file as of 09/04/2023.    Allergies (verified) Metformin and related   History: Past Medical History:  Diagnosis Date   Cataract    Diabetes mellitus without complication (HCC)    Dyspnea    on exertion   GERD (gastroesophageal reflux disease)    Hyperlipidemia    Hypertension    Multiple thyroid nodules    Thyroid disease    Past Surgical History:  Procedure Laterality Date   CHOLECYSTECTOMY     COSMETIC SURGERY Bilateral    eyelids   EYE SURGERY     GALLBLADDER SURGERY     MICROLARYNGOSCOPY WITH CO2 LASER AND EXCISION OF VOCAL CORD LESION N/A 04/02/2020   Procedure: MICROLARYNGOSCOPY WITH DILATION AND CO2 LASER AND EXCISION OF VOCAL CORD LESION w/MITOMYCIN C;  Surgeon: Christia Reading, MD;  Location: [facility] OR;  Service: ENT;  Laterality: N/A;   MICROLARYNGOSCOPY WITH DILATION N/A 05/23/2020   Procedure: MICROLARYNGOSCOPY W/ DILATION AND JET VENTILATION;  Surgeon: Christia Reading, MD;  Location: [facility] OR;  Service: ENT;  Laterality: N/A;   MICROLARYNGOSCOPY WITH DILATION N/A 07/13/2020   Procedure: MICROLARYNGOSCOPY with Jet Ventilation;  Surgeon: Christia Reading, MD;  Location: [facility] OR;  Service: ENT;  Laterality: N/A;   TRACHEOSTOMY TUBE PLACEMENT N/A 04/02/2020   Procedure: Awake TRACHEOSTOMY;  Surgeon: Christia Reading, MD;  Location: [facility] OR;  Service: ENT;  Laterality: N/A;   TUBAL LIGATION     Family History  Problem Relation Age of Onset   Heart disease Mother    Hypertension Mother    Cancer Sister    Diabetes Sister    Social History   Socioeconomic History   Marital status: Widowed    Spouse name: Not on file   Number of children: 3   Years of education: 66   Highest education level: Not on file  Occupational History   Occupation: Retired  Tobacco Use   Smoking status: Never   Smokeless tobacco: Never  Vaping Use   Vaping status: Never Used  Substance and Sexual Activity   Alcohol use:  No   Drug use: No   Sexual activity: Not Currently    Partners: Male  Other Topics Concern   Not on file  Social History Narrative   Mrs Harlan is a 77 year old Jehovah witness patient    She receives support from her daughter primarily who also assists with transportation to medical appointments.    She is presently independent with her ADLs and iADLs      Lives with son and daughter      Social Determinants of Health   Financial Resource Strain: Low Risk  (09/04/2023)   Overall Financial Resource Strain (CARDIA)    Difficulty of Paying Living Expenses: Not hard at all  Food Insecurity:  No Food Insecurity (09/04/2023)   Hunger Vital Sign    Worried About Running Out of Food in the Last Year: Never true    Ran Out of Food in the Last Year: Never true  Transportation Needs: No Transportation Needs (09/04/2023)   PRAPARE - Administrator, Civil Service (Medical): No    Lack of Transportation (Non-Medical): No  Physical Activity: Insufficiently Active (09/04/2023)   Exercise Vital Sign    Days of Exercise per Week: 5 days    Minutes of Exercise per Session: 20 min  Stress: No Stress Concern Present (09/04/2023)   [employer] of Occupational Health - Occupational Stress Questionnaire    Feeling of Stress : Not at all  Social Connections: Moderately Integrated (09/04/2023)   Social Connection and Isolation Panel [NHANES]    Frequency of Communication with Friends and Family: More than three times a week    Frequency of Social Gatherings with Friends and Family: More than three times a week    Attends Religious Services: More than 4 times per year    Active Member of [employer] or Organizations: Yes    Attends Banker Meetings: 1 to 4 times per year    Marital Status: Widowed    Tobacco Counseling Counseling given: Not Answered   Clinical Intake:  Pre-visit preparation completed: Yes  Pain : No/denies pain     BMI - recorded: 33.13 Nutritional  Status: BMI > 30  Obese Nutritional Risks: None Diabetes: Yes CBG done?: Yes (147 fasting) CBG resulted in Enter/ Edit results?: No Did pt. bring in CBG monitor from home?: No  How often do you need to have someone help you when you read instructions, pamphlets, or other written materials from your doctor or pharmacy?: 1 - Never  Interpreter Needed?: No  Information entered by :: Alaric Gladwin, RMA   Activities of Daily Living    09/04/2023   11:25 AM 09/15/2022    9:28 AM  In your present state of health, do you have any difficulty performing the following activities:  Hearing? 1 0  Comment wears hearing aides   Vision? 0 0  Difficulty concentrating or making decisions? 0 0  Walking or climbing stairs? 0 0  Dressing or bathing? 0 0  Doing errands, shopping? 0 0  Preparing Food and eating ? N N  Using the Toilet? N N  In the past six months, have you accidently leaked urine? Y N  Do you have problems with loss of bowel control? N N  Managing your Medications? N N  Managing your Finances? N N  Housekeeping or managing your Housekeeping? N N    Patient Care Team: Etta Grandchild, MD as PCP - General (Internal Medicine) Stephannie Li, MD as Consulting Physician (Ophthalmology) Carlus Pavlov, MD as Consulting Physician (Internal Medicine)  Indicate any recent Medical Services you may have received from other than [facility] providers in the past year (date may be approximate).     Assessment:   This is a routine wellness examination for Synae.  Hearing/Vision screen Hearing Screening - Comments:: wears hearing aides Vision Screening - Comments:: Wears eyeglasses   Goals Addressed               This Visit's Progress     Patient Stated (pt-stated)   Not on track     My goal is to get my HgA1c down to normal range and lose about 15 pounds.      Depression Screen  09/04/2023   11:34 AM 07/01/2023    1:45 PM 09/15/2022    9:28 AM 09/11/2021   12:59 PM  08/23/2020    2:03 PM 04/25/2020    2:21 PM 01/02/2020    1:14 PM  PHQ 2/9 Scores  PHQ - 2 Score 0 0 0 0 0 0 0  PHQ- 9 Score 0          Fall Risk    09/04/2023   11:29 AM 07/01/2023    1:45 PM 09/15/2022    9:31 AM 09/11/2021    1:00 PM 08/23/2020    2:03 PM  Fall Risk   Falls in the past year? 1 0 0 0 0  Number falls in past yr: 0 0 0 0 0  Injury with Fall? 0 0 0 0 0  Risk for fall due to : No Fall Risks No Fall Risks No Fall Risks No Fall Risks   Follow up Falls prevention discussed;Falls evaluation completed Falls evaluation completed Falls evaluation completed Falls evaluation completed Falls evaluation completed    MEDICARE RISK AT HOME: Medicare Risk at Home Any stairs in or around the home?: Yes (front in back doors) If so, are there any without handrails?: Yes Home free of loose throw rugs in walkways, pet beds, electrical cords, etc?: Yes Adequate lighting in your home to reduce risk of falls?: Yes Life alert?: No Use of a cane, walker or w/c?: No Grab bars in the bathroom?: Yes Shower chair or bench in shower?: Yes Elevated toilet seat or a handicapped toilet?: Yes  TIMED UP AND GO:  Was the test performed?  No    Cognitive Function:        09/04/2023   11:30 AM 09/15/2022    9:38 AM 01/02/2020    1:12 PM  6CIT Screen  What Year? 0 points 0 points 0 points  What month? 0 points 0 points 0 points  What time? 0 points 0 points 0 points  Count back from 20 0 points 0 points 0 points  Months in reverse 0 points 2 points 2 points  Repeat phrase 2 points 0 points 0 points  Total Score 2 points 2 points 2 points    Immunizations Immunization History  Administered Date(s) Administered   Fluad Quad(high Dose 65+) 08/23/2020, 08/15/2023   Influenza, High Dose Seasonal PF 07/27/2017, 07/08/2018, 06/19/2019   Influenza-Unspecified 08/13/2021   PFIZER(Purple Top)SARS-COV-2 Vaccination 11/08/2019, 11/29/2019   Pfizer Covid-19 Vaccine Bivalent Booster 46yrs & up  08/15/2023   Pneumococcal Conjugate-13 09/02/2019   Pneumococcal Polysaccharide-23 05/01/2021    TDAP status: Due, Education has been provided regarding the importance of this vaccine. Advised may receive this vaccine at local pharmacy or Health Dept. Aware to provide a copy of the vaccination record if obtained from local pharmacy or Health Dept. Verbalized acceptance and understanding.  Flu Vaccine status: Up to date  Pneumococcal vaccine status: Up to date  Covid-19 vaccine status: Completed vaccines  Qualifies for Shingles Vaccine? Yes   Zostavax completed No   Shingrix Completed?: No.    Education has been provided regarding the importance of this vaccine. Patient has been advised to call insurance company to determine out of pocket expense if they have not yet received this vaccine. Advised may also receive vaccine at local pharmacy or Health Dept. Verbalized acceptance and understanding.  Screening Tests Health Maintenance  Topic Date Due   DTaP/Tdap/Td (1 - Tdap) 10/20/2023 (Originally 04/24/1965)   Zoster Vaccines- Shingrix (1 of 2) 12/05/2023 (  Originally 04/24/1996)   COVID-19 Vaccine (4 - 2023-24 season) 10/10/2023   FOOT EXAM  01/01/2024   HEMOGLOBIN A1C  01/26/2024   OPHTHALMOLOGY EXAM  06/23/2024   Medicare Annual Wellness (AWV)  09/03/2024   Pneumonia Vaccine 61+ Years old  Completed   INFLUENZA VACCINE  Completed   DEXA SCAN  Completed   Hepatitis C Screening  Completed   HPV VACCINES  Aged Out   Colonoscopy  Discontinued    Health Maintenance  There are no preventive care reminders to display for this patient.   Colorectal cancer screening: No longer required.   Mammogram status: Completed 08/04/2023. Repeat every year  Bone Density status: Completed 08/12/2023. Results reflect: Bone density results: OSTEOPOROSIS. Repeat every 2 years.  Lung Cancer Screening: (Low Dose CT Chest recommended if Age 76-80 years, 20 pack-year currently smoking OR have quit w/in  15years.) does not qualify.   Lung Cancer Screening Referral: N/A  Additional Screening:  Hepatitis C Screening: does qualify; Completed 03/29/2018  Vision Screening: Recommended annual ophthalmology exams for early detection of glaucoma and other disorders of the eye. Is the patient up to date with their annual eye exam?  Yes  Who is the provider or what is the name of the office in which the patient attends annual eye exams? Dr. Allyne Gee If pt is not established with a provider, would they like to be referred to a provider to establish care? No .   Dental Screening: Recommended annual dental exams for proper oral hygiene  Diabetic Foot Exam: Diabetic Foot Exam: Completed 01/01/2023  Community Resource Referral / Chronic Care Management: CRR required this visit?  No   CCM required this visit?  No     Plan:     I have personally reviewed and noted the following in the patient's chart:   Medical and social history Use of alcohol, tobacco or illicit drugs  Current medications and supplements including opioid prescriptions. Patient is not currently taking opioid prescriptions. Functional ability and status Nutritional status Physical activity Advanced directives List of other physicians Hospitalizations, surgeries, and ER visits in previous 12 months Vitals Screenings to include cognitive, depression, and falls Referrals and appointments  In addition, I have reviewed and discussed with patient certain preventive protocols, quality metrics, and best practice recommendations. A written personalized care plan for preventive services as well as general preventive health recommendations were provided to patient.     Larisha Vencill L Haelyn Forgey, CMA   09/04/2023   After Visit Summary: (MyChart) Due to this being a telephonic visit, the after visit summary with patients personalized plan was offered to patient via MyChart   Nurse Notes: Patient is due for a Tdap and Shingrix vaccines.  She  is up to date on all other health maintenance.  Patient had no concerns to address today.

## 2023-09-04 NOTE — Patient Instructions (Signed)
Bridget Butler , Thank you for taking time to come for your Medicare Wellness Visit. I appreciate your ongoing commitment to your health goals. Please review the following plan we discussed and let me know if I can assist you in the future.   Referrals/Orders/Follow-Ups/Clinician Recommendations: Each day, aim for 6 glasses of water, plenty of protein in your diet and try to get up and walk/ stretch every hour for 5-10 minutes at a time.    This is a list of the screening recommended for you and due dates:  Health Maintenance  Topic Date Due   DTaP/Tdap/Td vaccine (1 - Tdap) 10/20/2023*   Zoster (Shingles) Vaccine (1 of 2) 12/05/2023*   COVID-19 Vaccine (4 - 2023-24 season) 10/10/2023   Complete foot exam   01/01/2024   Hemoglobin A1C  01/26/2024   Eye exam for diabetics  06/23/2024   Medicare Annual Wellness Visit  09/03/2024   Pneumonia Vaccine  Completed   Flu Shot  Completed   DEXA scan (bone density measurement)  Completed   Hepatitis C Screening  Completed   HPV Vaccine  Aged Out   Colon Cancer Screening  Discontinued  *Topic was postponed. The date shown is not the original due date.    Advanced directives: (Copy Requested) Please bring a copy of your health care power of attorney and living will to the office to be added to your chart at your convenience.  Next Medicare Annual Wellness Visit scheduled for next year: Yes

## 2023-09-08 ENCOUNTER — Ambulatory Visit: Payer: Medicare PPO | Admitting: Internal Medicine

## 2023-09-09 ENCOUNTER — Other Ambulatory Visit: Payer: Medicare PPO | Admitting: Pharmacist

## 2023-09-09 DIAGNOSIS — Z794 Long term (current) use of insulin: Secondary | ICD-10-CM

## 2023-09-09 DIAGNOSIS — N1832 Chronic kidney disease, stage 3b: Secondary | ICD-10-CM

## 2023-09-09 DIAGNOSIS — M810 Age-related osteoporosis without current pathological fracture: Secondary | ICD-10-CM

## 2023-09-09 DIAGNOSIS — E1122 Type 2 diabetes mellitus with diabetic chronic kidney disease: Secondary | ICD-10-CM

## 2023-09-09 MED ORDER — ALENDRONATE SODIUM 70 MG PO TABS: 70.0000 mg | ORAL_TABLET | ORAL | 2 refills | Status: DC

## 2023-09-09 NOTE — Progress Notes (Signed)
09/09/2023 Name: Bridget Butler MRN: [id number] DOB: 12-13-1945  Chief Complaint  Patient presents with   Medication Management    Bridget Butler is a 77 y.o. year old female who presented for a telephone visit.   They were referred to the pharmacist by their PCP for assistance in managing diabetes and CKD .     Subjective:  Care Team: Primary Care Provider: Etta Grandchild, MD ; Next Scheduled Visit: none scheduled Endocrinologist Dr. Lafe Garin; Next Scheduled Visit: 09/08/2023  Medication Access/Adherence  Current Pharmacy:  [facility] [address] ([address]), [place] - [address] [place] ([place]) [place] [address] Phone: [phone] Fax: [phone]  [facility] [place], [place] - [address] [place] [address] Phone: [phone] Fax: [phone]  [facility] - [place], [place] - [address] [place] [address] Phone: [phone] Fax: [phone]  Gerri Spore LONG - [facility] [address] [place] [address] Phone: [phone] Fax: [phone]   Patient reports affordability concerns with their medications: No  Patient reports access/transportation concerns to their pharmacy: No  Patient reports adherence concerns with their medications:  No     Diabetes:  Current medications: Basaglar 24 units daily (pt just started taking 20 units in the AM and 10 units in the PM last night), Trulicity 3 mg weekly Medications tried in the past: metformin XR (stopped recently due to GI upset), farxiga   Current glucose readings: Fasting this morning was 130 after self-increasing her basal insulin  Current meal patterns:  - Breakfast: whole grain cereal with almond milk, banana - Lunch: chicken sandwich - Supper: meat, vegetable, starch - Snacks: Pringles or popcorn - Drinks: 1 liter of soda per  week  Current physical activity: walks dog every morning, does exercises at home before bed. Used to do water aerobics but not currently  Current medication access support: Trulicity through PAP  CKD: Recently prescribed Chauncey Mann but is unable to afford Currently taking olmesartan 20 mg daily  Osteoporosis: On 08/12/23 Bone density scan  Objective:  Lab Results  Component Value Date   HGBA1C 9.6 (H) 07/28/2023    Lab Results  Component Value Date   CREATININE 1.14 07/28/2023   BUN 13 07/28/2023   NA 134 (L) 07/28/2023   K 4.3 07/28/2023   CL 99 07/28/2023   CO2 29 07/28/2023    Lab Results  Component Value Date   CHOL 207 (H) 07/28/2023   HDL 72.30 07/28/2023   LDLCALC 99 07/28/2023   TRIG 178.0 (H) 07/28/2023   CHOLHDL 3 07/28/2023    Medications Reviewed Today     Reviewed by Bonita Quin, RPH (Pharmacist) on 09/09/23 at 1402  Med List Status: <None>   Medication Order Taking? Sig Documenting Provider Last Dose Status Informant  Blood Glucose Monitoring Suppl (TRUE METRIX METER) DEVI [id number]  1 Device by Does not apply route 2 (two) times daily. [place] Grandchild, MD  Active   Cholecalciferol 50 MCG (2000 UT) TABS [id number]  Take 1 tablet (2,000 Units total) by mouth daily. Etta Grandchild, MD  Active   Continuous Glucose Sensor (FREESTYLE LIBRE 3 PLUS SENSOR) [place] [id number]  1 each by Does not apply route every 14 (fourteen) days. Carlus Pavlov, MD  Active   Dulaglutide (TRULICITY) 3 MG/0.5ML Ivory Broad [id number] Yes Inject 3 mg into the skin once a week. Elvera Lennox,  Silvestre Mesi, MD Taking Active   glucose blood (TRUE METRIX BLOOD GLUCOSE TEST) test strip [id number]  1 each by Other route 2 (two) times daily. USE AS INSTRUCTED TO CHECK BLOOD SUGAR ONE TIME DAILY Etta Grandchild, MD  Active   Insulin Glargine [facility] KWIKPEN) 100 UNIT/ML [id number] Yes Inject 24 Units into the skin daily.  Patient taking differently: Inject 24 Units into the skin daily. 20 units in  the AM and 10 units in the PM   Carlus Pavlov, MD Taking Active   Insulin Pen Needle 32G X 6 MM MISC [id number]  1 Act by Does not apply route once a week. Etta Grandchild, MD  Active   meclizine (ANTIVERT) 12.5 MG tablet [id number] No Take 1 tablet (12.5 mg total) by mouth 3 (three) times daily as needed for dizziness.  Patient not taking: Reported on 09/09/2023   Corwin Levins, MD Not Taking Active   methimazole (TAPAZOLE) 5 MG tablet [id number]  Take 1 tablet (5 mg total) by mouth daily. Carlus Pavlov, MD  Active   Multiple Vitamin (MULTIVITAMIN WITH MINERALS) TABS tablet [id number]  Take 1 tablet by mouth daily. [provider]  Active Self  olmesartan (BENICAR) 20 MG tablet [id number] Yes Take 1 tablet (20 mg total) by mouth daily. Corwin Levins, MD Taking Active   omeprazole (PRILOSEC) 40 MG capsule [id number]  TAKE 1 CAPSULE BY MOUTH TWICE DAILY --  IN  THE  MORNING  AND  AT  BEDTIME  Patient taking differently: Take 40 mg by mouth daily. TAKE 1 CAPSULE BY MOUTH TWICE DAILY --  IN  THE  MORNING  AND  AT  BEDTIME   Etta Grandchild, MD  Active   potassium chloride (KLOR-CON 10) 10 MEQ tablet [id number]  Take 1 tablet (10 mEq total) by mouth 2 (two) times daily. Etta Grandchild, MD  Active   pravastatin (PRAVACHOL) 40 MG tablet [id number]  Take 1 tablet (40 mg total) by mouth at bedtime. Etta Grandchild, MD  Active   repaglinide (PRANDIN) 1 MG tablet [id number]  Take 1 tablet (1 mg total) by mouth daily before supper. Carlus Pavlov, MD  Active               Assessment/Plan:   Diabetes: - Currently uncontrolled, A1c goal <8% - Reviewed goal fasting, and goal 2 hour post prandial glucose - Reviewed dietary modifications including increasing protein and fiber, focusing on balanced snacks/meals, carb heavy foods in moderation, reducing soda consumption - Recommend to increase Toujeo to 20 units daily - has upcoming follow up with endocrinologist in 1 month. Recommended  discussing with Dr. Wyonia Hough switching Trulicity to [employer]    CKD - Consider increasing olmesartan if needed for renal protection  Osteoporosis: New diagnosis on 08/12/23 bone density scan with Tscore -2.7. Previously had osteopenia. - Counseled on fall risk and weight bearing exercise - Discussed recommended total daily calcium. Recommend calcium citrate 600 mg per day supplement to get 1000-1200 mg calcium total per day including dietary intake - Recommend starting alendronate 70 mg weekly - Recommend vitamin D check with next labs  Follow Up Plan: 3 weeks  Arbutus Leas, PharmD, BCPS [facility] [phone]

## 2023-09-09 NOTE — Patient Instructions (Signed)
It was a pleasure speaking with you today!  Start taking calcium citrate 600 mg once daily in the evening for your bones.   We will also start alendronate 70 mg once weekly for your bone density. Make sure to take this first thing in the morning about 30-45 minutes before food or other medications. Remain upright for at least 30 minutes after taking it.  Feel free to call with any questions or concerns!  Arbutus Leas, PharmD, BCPS [facility] Clinical Pharmacist [facility] [phone]

## 2023-09-14 ENCOUNTER — Other Ambulatory Visit: Payer: Self-pay

## 2023-09-14 DIAGNOSIS — E1122 Type 2 diabetes mellitus with diabetic chronic kidney disease: Secondary | ICD-10-CM

## 2023-09-14 MED ORDER — BASAGLAR KWIKPEN 100 UNIT/ML ~~LOC~~ SOPN: 24.0000 [IU] | PEN_INJECTOR | Freq: Every day | SUBCUTANEOUS | 2 refills | Status: DC

## 2023-09-23 ENCOUNTER — Other Ambulatory Visit: Payer: Self-pay

## 2023-09-23 ENCOUNTER — Telehealth: Payer: Self-pay | Admitting: Internal Medicine

## 2023-09-23 DIAGNOSIS — E1159 Type 2 diabetes mellitus with other circulatory complications: Secondary | ICD-10-CM

## 2023-09-23 DIAGNOSIS — E876 Hypokalemia: Secondary | ICD-10-CM

## 2023-09-23 MED ORDER — POTASSIUM CHLORIDE ER 10 MEQ PO TBCR: 10.0000 meq | EXTENDED_RELEASE_TABLET | Freq: Two times a day (BID) | ORAL | 1 refills | Status: DC

## 2023-09-23 NOTE — Telephone Encounter (Signed)
Prescription Request  09/23/2023  LOV: 07/28/2023  What is the name of the medication or equipment? potassium chloride (KLOR-CON 10) 10 MEQ tablet   Have you contacted your pharmacy to request a refill? No   Which pharmacy would you like this sent to?    [facility] [address] ([address]), [place] - [address] [place]) [place] [address] Phone: [phone] Fax: [phone]   Patient notified that their request is being sent to the clinical staff for review and that they should receive a response within 2 business days.   Please advise at [place] [phone] (mobile)

## 2023-09-23 NOTE — Telephone Encounter (Signed)
Medication has been refilled and sent to her pharmacy of choice.

## 2023-09-30 ENCOUNTER — Other Ambulatory Visit: Payer: Medicare PPO | Admitting: Pharmacist

## 2023-09-30 DIAGNOSIS — E119 Type 2 diabetes mellitus without complications: Secondary | ICD-10-CM

## 2023-09-30 NOTE — Progress Notes (Signed)
09/30/2023 Name: Bridget Butler MRN: [id number] DOB: [birth year]  No chief complaint on file.   Bridget Butler is a 77 y.o. year old female who presented for a telephone visit.   They were referred to the pharmacist by their PCP for assistance in managing diabetes and CKD .    Subjective:  Care Team: Primary Care Provider: Etta Grandchild, MD ; Next Scheduled Visit: none scheduled Endocrinologist Dr. Lafe Garin; Next Scheduled Visit: 09/08/2023  Medication Access/Adherence  Current Pharmacy:  [facility] [address] (SE), [place] - [address] [place] ([place]) [place] [address] Phone: [phone] Fax: [phone]  [facility] [place], [place] - [address] [place] [address] Phone: [phone] Fax: [phone]  [facility] - [place], [place] - [address] [place] [address] Phone: [phone] Fax: [phone]  Gerri Spore LONG - [facility] [address] [place] [address] Phone: [phone] Fax: [phone]   Patient reports affordability concerns with their medications: No  Patient reports access/transportation concerns to their pharmacy: No  Patient reports adherence concerns with their medications:  No     Diabetes:  Current medications: Basaglar 24 units daily (pt just started taking 20 units in the AM and 6 units in the PM last night), Trulicity 3 mg weekly Medications tried in the past: metformin XR (stopped recently due to GI upset), farxiga **Endo is switching Trulicity to Ozempic, PAP approved per patient, waiting on shipment  Current glucose readings: 208 this morning   Current meal patterns: She notes she has been working on increasing protein and decreasing carbs - Breakfast: whole grain cereal with almond milk, banana - Lunch: chicken sandwich - Supper: meat, vegetable, starch -  Snacks: Pringles or popcorn - Drinks: 1 liter of soda per week  Current physical activity: walks dog every morning, does exercises at home before bed. Used to do water aerobics but not currently  Current medication access support: Trulicity through PAP  CKD: Recently prescribed Chauncey Mann but is unable to afford Currently taking olmesartan 20 mg daily  Osteoporosis: On 08/12/23 Bone density scan Pt did not get alendronate from the pharmacy (notes they did not fill a new Rx) and has not gotten calcium citrate  Objective:  Lab Results  Component Value Date   HGBA1C 9.6 (H) 07/28/2023    Lab Results  Component Value Date   CREATININE 1.14 07/28/2023   BUN 13 07/28/2023   NA 134 (L) 07/28/2023   K 4.3 07/28/2023   CL 99 07/28/2023   CO2 29 07/28/2023    Lab Results  Component Value Date   CHOL 207 (H) 07/28/2023   HDL 72.30 07/28/2023   LDLCALC 99 07/28/2023   TRIG 178.0 (H) 07/28/2023   CHOLHDL 3 07/28/2023    Medications Reviewed Today   Medications were not reviewed in this encounter       Assessment/Plan:   Diabetes: - Currently uncontrolled, A1c goal <8% - Reviewed goal fasting, and goal 2 hour post prandial glucose - Reviewed dietary modifications including increasing protein and fiber, focusing on balanced snacks/meals, carb heavy foods in moderation, reducing soda consumption - Continue currently regimen, await switch to Ozempic which may further improve BG   CKD - Consider increasing olmesartan if needed for renal protection  Osteoporosis: New diagnosis on 08/12/23 bone density scan with Tscore -2.7. Previously had osteopenia. - Counseled on fall  risk and weight bearing exercise - Discussed recommended total daily calcium. Recommend calcium citrate 600 mg per day supplement to get 1000-1200 mg calcium total per day including dietary intake - Recommend starting alendronate 70 mg weekly - advised pt to contact her pharmacy to see if they have the Rx  sent 11/20 - Recommend vitamin D check with next labs  Follow Up Plan: 2 months  Arbutus Leas, PharmD, BCPS [facility] [phone]

## 2023-10-01 ENCOUNTER — Other Ambulatory Visit (HOSPITAL_COMMUNITY): Payer: Self-pay

## 2023-10-01 ENCOUNTER — Telehealth: Payer: Self-pay

## 2023-10-01 NOTE — Telephone Encounter (Signed)
Pharmacy Patient Advocate Encounter   Received notification from CoverMyMeds that prior authorization for Freestyle libre 3 plus is required/requested.   Insurance verification completed.   The patient is insured through [employer] .   Per test claim: PA required; PA submitted to above mentioned insurance via CoverMyMeds Key/confirmation #/EOC [id number] Status is pending

## 2023-10-02 NOTE — Telephone Encounter (Signed)
Pharmacy Patient Advocate Encounter  Received notification from [facility] that Prior Authorization for [facility] 3 plus has been APPROVED through 10/18/2024   PA #/Case ID/Reference #: [id number]

## 2023-10-07 ENCOUNTER — Telehealth: Payer: Self-pay

## 2023-10-08 ENCOUNTER — Other Ambulatory Visit (HOSPITAL_COMMUNITY): Payer: Self-pay

## 2023-10-08 NOTE — Telephone Encounter (Signed)
Placed a call to [facility] at [phone] for benefit verification for Prolia injection.   Per the representative, the patient has a $0 office copay, $350 deductible ($303.16 met), max out of pocket $8300 [phone] met). Prior authorization required.

## 2023-10-08 NOTE — Telephone Encounter (Signed)
Pharmacy Patient Advocate Encounter   Received notification from Phone that prior authorization for Prolia is required/requested.   Insurance verification completed.   The patient is insured through [employer] .   Per test claim: PA required; PA submitted to above mentioned insurance via CoverMyMeds Key/confirmation #/EOC [facility] Status is pending

## 2023-10-09 ENCOUNTER — Telehealth: Payer: Self-pay

## 2023-10-09 NOTE — Telephone Encounter (Signed)
Patient Assistance  Medication:Ozempic Dosage:0.25/0.5 mg Quantity:2 boxes  Date received:10/09/23  [name]

## 2023-10-15 NOTE — Telephone Encounter (Signed)
 Patient picked up 2 boxes of Ozempic from patient Assistance

## 2023-10-19 ENCOUNTER — Other Ambulatory Visit (HOSPITAL_COMMUNITY): Payer: Self-pay

## 2023-10-19 NOTE — Telephone Encounter (Signed)
Pharmacy Patient Advocate Encounter  Received notification from [facility] that Prior Authorization for PROLIA has been APPROVED from 10/08/23 to 10/19/24. Ran test claim, Copay is $364. This test claim was processed through [facility]- copay amounts may vary at other pharmacies due to pharmacy/plan contracts, or as the patient moves through the different stages of their insurance plan.   PA #/Case ID/Reference #: [id number]

## 2023-10-19 NOTE — Telephone Encounter (Signed)
Pt ready for scheduling for PROLIA on or after : 10/19/23  Out-of-pocket cost due at time of visit: $46.84 (DEDUCTIBLE)  Number of injection/visits approved: 2  Primary: [employer] Prolia co-insurance: 0% Admin fee co-insurance: 0%  Secondary: --- Prolia co-insurance:  Admin fee co-insurance:   Medical Benefit Details: Date Benefits were checked: 10/08/23 Deductible: $303.16 MET OUT OF $350 REQUIRED / Coinsurance: 0%/ Admin Fee: 0%  Prior Auth: APPROVED PA# [id number] Expiration Date:10/08/23-10/19/24   # of doses approved: 2  Pharmacy benefit: Copay $364 If patient wants fill through the pharmacy benefit please send prescription to: [employer], and include estimated need by date in rx notes. Pharmacy will ship medication directly to the office.  Patient NOT eligible for Prolia Copay Card. Copay Card can make patient's cost as little as $25. Link to apply: [email or web address]://[email or web address].[email or web address]  ** This summary of benefits is an estimation of the patient's out-of-pocket cost. Exact cost may very based on individual plan coverage.

## 2023-10-22 ENCOUNTER — Telehealth: Payer: Self-pay

## 2023-10-22 NOTE — Telephone Encounter (Signed)
 Copied from CRM [id number]. Topic: Clinical - Medication Refill >> Oct 22, 2023 12:02 PM Deidre DASEN wrote: Most Recent Primary Care Visit:  Provider: MERCEDA FILE R  Department: [facility]  Visit Type: PATIENT OUTREACH 30  Date: 09/30/2023  Medication: pravastatin  (PRAVACHOL ) 40 MG tablet  Has the patient contacted their pharmacy? No (Agent: If no, request that the patient contact the pharmacy for the refill. If patient does not wish to contact the pharmacy document the reason why and proceed with request.) (Agent: If yes, when and what did the pharmacy advise?)  Is this the correct pharmacy for this prescription? Yes If no, delete pharmacy and type the correct one.  This is the patient's preferred pharmacy:  [facility] [address] ([address]), [place] - [address] [place] ([place]) [place] [address] Phone: [phone] Fax: [phone]  [facility] [place], [place] - [address] [place] [address] Phone: [phone] Fax: [phone]  [facility] - [place], [place] - [address] [place] [address] Phone: [phone] Fax: [phone]  DARRYLE LONG - [facility] [address] [place] [address] Phone: [phone] Fax: [phone]   Has the prescription been filled recently? No  Is the patient out of the medication? Yes  Has the patient been seen for an appointment in the last year OR does the patient have an upcoming appointment? Yes  Can we respond through MyChart? No  Agent: Please be advised that Rx refills may take up to 3 business days. We ask that you follow-up with your pharmacy.

## 2023-10-23 ENCOUNTER — Other Ambulatory Visit: Payer: Self-pay

## 2023-10-23 DIAGNOSIS — E785 Hyperlipidemia, unspecified: Secondary | ICD-10-CM

## 2023-10-23 MED ORDER — PRAVASTATIN SODIUM 40 MG PO TABS: 40.0000 mg | ORAL_TABLET | Freq: Every day | ORAL | 1 refills | Status: DC

## 2023-10-23 NOTE — Telephone Encounter (Signed)
 Duplicate

## 2023-10-23 NOTE — Telephone Encounter (Signed)
 Medication has been refilled.

## 2023-10-27 NOTE — Telephone Encounter (Signed)
 Please update per 2025

## 2023-10-28 ENCOUNTER — Telehealth: Payer: Self-pay

## 2023-10-28 NOTE — Telephone Encounter (Signed)
 New encounter created for 2025 benefits

## 2023-10-28 NOTE — Telephone Encounter (Signed)
 Prolia VOB initiated via [email or web address]

## 2023-10-30 ENCOUNTER — Other Ambulatory Visit (HOSPITAL_COMMUNITY): Payer: Self-pay

## 2023-11-02 ENCOUNTER — Other Ambulatory Visit (HOSPITAL_COMMUNITY): Payer: Self-pay

## 2023-11-04 ENCOUNTER — Other Ambulatory Visit (HOSPITAL_COMMUNITY): Payer: Self-pay

## 2023-11-05 ENCOUNTER — Other Ambulatory Visit (HOSPITAL_COMMUNITY): Payer: Self-pay

## 2023-11-10 ENCOUNTER — Other Ambulatory Visit (HOSPITAL_COMMUNITY): Payer: Self-pay

## 2023-11-10 NOTE — Telephone Encounter (Signed)
She hasn't failed both, do I need to advise Dr. Yetta Barre ?

## 2023-11-10 NOTE — Telephone Encounter (Signed)
Prior Authorization form/request asks a question that requires your assistance. Please see the question below and advise accordingly.   Q

## 2023-11-10 NOTE — Telephone Encounter (Signed)
 [name] Kitchen

## 2023-11-11 ENCOUNTER — Other Ambulatory Visit (HOSPITAL_COMMUNITY): Payer: Self-pay

## 2023-11-11 ENCOUNTER — Other Ambulatory Visit: Payer: Self-pay | Admitting: Internal Medicine

## 2023-11-11 NOTE — Telephone Encounter (Signed)
Yes. Insurance will not approve for buy & bill since she has not failed or has a contraindication to both. She can use her pharmacy benefits but the copay is $555.00.

## 2023-11-11 NOTE — Telephone Encounter (Signed)
Please advise what were doing next

## 2023-11-17 ENCOUNTER — Telehealth: Payer: Self-pay

## 2023-11-17 DIAGNOSIS — E1122 Type 2 diabetes mellitus with diabetic chronic kidney disease: Secondary | ICD-10-CM

## 2023-11-17 NOTE — Telephone Encounter (Signed)
Pt called and left a vm stating she needs a refill on Trulicty (but she has been switched to [employer]) and [employer].

## 2023-11-23 ENCOUNTER — Other Ambulatory Visit: Payer: Medicare PPO

## 2023-11-23 DIAGNOSIS — Z794 Long term (current) use of insulin: Secondary | ICD-10-CM

## 2023-11-23 MED ORDER — BASAGLAR KWIKPEN 100 UNIT/ML ~~LOC~~ SOPN: 24.0000 [IU] | PEN_INJECTOR | Freq: Every day | SUBCUTANEOUS | 2 refills | Status: DC

## 2023-11-23 NOTE — Telephone Encounter (Signed)
LMTRC, but Bridget Butler has been sent to the pharmacy.

## 2023-11-23 NOTE — Progress Notes (Unsigned)
11/23/2023 Name: Bridget Butler MRN: [id number] DOB: 1946-04-18  No chief complaint on file.   Bridget Butler is a 78 y.o. year old female who presented for a telephone visit.   They were referred to the pharmacist by their PCP for assistance in managing diabetes and CKD .   BG this morning 247 Basaglar 20 units and 10 units at night [employer] 3 plus to [facility] [address]   Cereal (cheerios w/ almond) with banana and almond milk  Subjective:  Care Team: Primary Care Provider: Etta Grandchild, MD ; Next Scheduled Visit: none scheduled Endocrinologist Dr. Lafe Garin; Next Scheduled Visit: 09/08/2023  Medication Access/Adherence  Current Pharmacy:  [facility] [address] ([address]), [place] - [address] [place] ([place]) [place] [address] Phone: [phone] Fax: [phone]  [facility] [place], [place] - [address] [place] [address] Phone: [phone] Fax: [phone]  [facility] - [place], [place] - [address] [place] [address] Phone: [phone] Fax: [phone]  [facility] - [facility] [address] [place] [address] Phone: [phone] Fax: [phone]   Patient reports affordability concerns with their medications: No  Patient reports access/transportation concerns to their pharmacy: No  Patient reports adherence concerns with their medications:  No     Diabetes:  Current medications: Basaglar 24 units daily (pt just started taking 20 units in the AM and 6 units in the PM last night), Trulicity 3 mg weekly Medications tried in the past: metformin XR (stopped recently due to GI upset), farxiga **Endo is switching Trulicity to Ozempic, PAP approved per patient, waiting on shipment  Current glucose readings: 208 this morning   Current meal patterns: She notes she has been working  on increasing protein and decreasing carbs - Breakfast: whole grain cereal with almond milk, banana - Lunch: chicken sandwich - Supper: meat, vegetable, starch - Snacks: Pringles or popcorn - Drinks: 1 liter of soda per week  Current physical activity: walks dog every morning, does exercises at home before bed. Used to do water aerobics but not currently  Current medication access support: Trulicity through PAP  CKD: Recently prescribed Chauncey Mann but is unable to afford Currently taking olmesartan 20 mg daily  Osteoporosis: On 08/12/23 Bone density scan Pt did not get alendronate from the pharmacy (notes they did not fill a new Rx) and has not gotten calcium citrate  Objective:  Lab Results  Component Value Date   HGBA1C 9.6 (H) 07/28/2023    Lab Results  Component Value Date   CREATININE 1.14 07/28/2023   BUN 13 07/28/2023   NA 134 (L) 07/28/2023   K 4.3 07/28/2023   CL 99 07/28/2023   CO2 29 07/28/2023    Lab Results  Component Value Date   CHOL 207 (H) 07/28/2023   HDL 72.30 07/28/2023   LDLCALC 99 07/28/2023   TRIG 178.0 (H) 07/28/2023   CHOLHDL 3 07/28/2023    Medications Reviewed Today   Medications were not reviewed in this encounter       Assessment/Plan:   Diabetes: - Currently uncontrolled, A1c goal <8% - Reviewed goal fasting, and goal 2 hour post prandial glucose - Reviewed dietary modifications including increasing protein and fiber, focusing on balanced snacks/meals, carb heavy foods in moderation, reducing soda consumption - Continue currently regimen, await switch to Ozempic which may further improve BG  CKD - Consider increasing olmesartan if needed for renal protection  Osteoporosis: New diagnosis on 08/12/23 bone density scan with Tscore -2.7. Previously had osteopenia. - Counseled on fall risk and weight bearing exercise - Discussed recommended total daily calcium. Recommend calcium citrate 600 mg per day supplement to get  1000-1200 mg calcium total per day including dietary intake - Recommend starting alendronate 70 mg weekly - advised pt to contact her pharmacy to see if they have the Rx sent 11/20 - Recommend vitamin D check with next labs  Follow Up Plan: 2 months  Arbutus Leas, PharmD, BCPS [facility] [phone]

## 2023-11-24 NOTE — Telephone Encounter (Signed)
 Error

## 2023-12-01 ENCOUNTER — Telehealth: Payer: Self-pay

## 2023-12-01 ENCOUNTER — Telehealth: Payer: Self-pay | Admitting: Internal Medicine

## 2023-12-01 NOTE — Telephone Encounter (Signed)
Left HIPAA compliant V/M on 10-07-23 for Re-enrollment for 2025. No reply yet.

## 2023-12-01 NOTE — Telephone Encounter (Signed)
FOLLOWING UP SEE TELEPHONE NOTES FROM PROVIDER 12/01/2023 LOOKS LIKE [facility] OFFICE IS TAKING CARE OF PAP FOR THIS PT.

## 2023-12-01 NOTE — Telephone Encounter (Signed)
MEDICATION:  Trulicity Dulaglutide (TRULICITY) 3 MG/0.5ML SOAJ  PHARMACY:    [id number] -  (SE), [place] - [address] (Ph: [phone])    HAS THE PATIENT CONTACTED THEIR PHARMACY?  Yes  IS THIS A 90 DAY SUPPLY : Yes  IS PATIENT OUT OF MEDICATION: Yes  IF NOT; HOW MUCH IS LEFT:   LAST APPOINTMENT DATE: @10 /31/2024  NEXT APPOINTMENT DATE:@3 /03/2024  DO WE HAVE YOUR PERMISSION TO LEAVE A DETAILED MESSAGE?Yes  OTHER COMMENTS: Patient states that she is in a program where she gets Trulicity for free   **Let patient know to contact pharmacy at the end of the day to make sure medication is ready. **  ** Please notify patient to allow 48-72 hours to process**  **Encourage patient to contact the pharmacy for refills or they can request refills through [facility]**

## 2023-12-02 NOTE — Telephone Encounter (Signed)
LMTRCs, pt is supposed to be on Ozempic. When she returns my call I will Clarify with her to see which one she wants to take.   J.Latandra Loureiro,RMA

## 2023-12-04 NOTE — Telephone Encounter (Signed)
LMTRC  J.Shonna Deiter,RMA

## 2023-12-08 ENCOUNTER — Telehealth: Payer: Self-pay

## 2023-12-08 NOTE — Telephone Encounter (Signed)
Sample  Medication:Ozempic Dose: 0.25/0.5 Quantity:1 box UEA:[id number] EXP:07/19/25  [name]

## 2023-12-08 NOTE — Telephone Encounter (Signed)
I called and spoke with the patient and advised to her that she needs to be on Ozempic per Dr. Elvera Lennox and I have faxed over her re-order form.

## 2023-12-08 NOTE — Telephone Encounter (Signed)
Patient picked up 11:45am 12/08/23

## 2023-12-18 ENCOUNTER — Other Ambulatory Visit: Payer: Self-pay | Admitting: Internal Medicine

## 2023-12-18 DIAGNOSIS — N1831 Chronic kidney disease, stage 3a: Secondary | ICD-10-CM

## 2023-12-18 DIAGNOSIS — E1159 Type 2 diabetes mellitus with other circulatory complications: Secondary | ICD-10-CM

## 2023-12-24 ENCOUNTER — Encounter: Payer: Self-pay | Admitting: Internal Medicine

## 2023-12-24 ENCOUNTER — Ambulatory Visit: Payer: Medicare PPO | Admitting: Internal Medicine

## 2023-12-24 VITALS — BP 130/70 | HR 96 | Ht 63.0 in | Wt 195.4 lb

## 2023-12-24 DIAGNOSIS — N1831 Chronic kidney disease, stage 3a: Secondary | ICD-10-CM | POA: Diagnosis not present

## 2023-12-24 DIAGNOSIS — E042 Nontoxic multinodular goiter: Secondary | ICD-10-CM

## 2023-12-24 DIAGNOSIS — Z794 Long term (current) use of insulin: Secondary | ICD-10-CM

## 2023-12-24 DIAGNOSIS — E1122 Type 2 diabetes mellitus with diabetic chronic kidney disease: Secondary | ICD-10-CM

## 2023-12-24 DIAGNOSIS — E0590 Thyrotoxicosis, unspecified without thyrotoxic crisis or storm: Secondary | ICD-10-CM | POA: Diagnosis not present

## 2023-12-24 DIAGNOSIS — Z7985 Long-term (current) use of injectable non-insulin antidiabetic drugs: Secondary | ICD-10-CM

## 2023-12-24 LAB — POCT GLYCOSYLATED HEMOGLOBIN (HGB A1C): Hemoglobin A1C: 8.7 % — AB (ref 4.0–5.6)

## 2023-12-24 MED ORDER — LANTUS SOLOSTAR 100 UNIT/ML ~~LOC~~ SOPN: 24.0000 [IU] | PEN_INJECTOR | Freq: Every day | SUBCUTANEOUS | 3 refills | Status: DC

## 2023-12-24 NOTE — Progress Notes (Addendum)
 Patient ID: Bridget Butler, female   DOB: Jul 23, 1946, 78 y.o.   MRN: [id number]   HPI  Bridget Butler is a 78 y.o.-year-old female, initially referred by her PCP, Dr. Clelia Butler, returning for follow-up for toxic thyroid adenomas with subclinical thyrotoxicosis and now also uncontrolled DM2, insulin-dependent, with complications (CKD, DR, PN).  Last visit 3 months ago.  Interim history: No increased urination, nausea, chest pain.  She got the freestyle libre CGM since last visit but we just started the sensor for her during the visit today.  Toxic thyroid nodules:  Reviewed history: She was diagnosed with hyperthyroidism approximately 2010. She was seeing Dr. Margaretmary Butler - he retired. She was started on medication (cannot remember name) >> felt "off" >> stopped in ~2010. I recommended to add methimazole 2.5 mg in 08/2022 but taking it inconsistently. In 01/2023, I advised her to take 5 mg every other day consistently. She took 2.5 mg daily as she had them already cut, but ran out sometime ago, cannot remember how long... Since then, we restarted methimazole, latest recommendation being to increase the dose to 5 mg daily (04/2023). At the visit with PCP in 07/2023, she reported not taking the methimazole and she was not aware of her thyroid diagnosis...  Reviewed her TFTs: Lab Results  Component Value Date   TSH 0.36 (L) 07/28/2023   TSH 0.20 (L) 05/04/2023   TSH 0.31 (L) 02/12/2023   TSH 0.23 (L) 12/30/2022   TSH 0.20 (L) 09/01/2022   TSH 0.36 12/31/2021   TSH 0.38 01/14/2021   TSH 0.57 07/18/2020   TSH 0.148 (L) 06/04/2020   TSH 0.03 (L) 04/11/2019   FREET4 0.72 05/04/2023   FREET4 0.72 02/12/2023   FREET4 0.90 09/01/2022   FREET4 0.90 01/14/2021   FREET4 0.66 07/18/2020   FREET4 0.93 06/08/2020   FREET4 0.84 04/11/2019   FREET4 0.97 10/29/2018   FREET4 1.05 07/08/2018   T3FREE 3.5 05/04/2023   T3FREE 3.0 02/12/2023   T3FREE 3.3 09/01/2022   T3FREE 3.0 01/14/2021   T3FREE 3.1  07/18/2020   T3FREE 3.0 04/11/2019   T3FREE 3.8 10/29/2018   T3FREE 3.3 07/08/2018   Her TSI antibodies were not elevated: Lab Results  Component Value Date   TSI <89 10/29/2018    Thyroid uptake (08/28/2007):  There is heterogeneous uptake within a normal sized gland. There are regions of photopenia within the bilateral upper poles and in the left lower pole. The most intense uptake is in the mid pole of the right lobe of the thyroid gland. Decreased uptake in the inferior pole of the right lobe of the thyroid gland additionally.   Calculated 24-hour I-131 uptake is equal to 25.0%, which is normal (normal 10-30%).   IMPRESSION:   1. Heterogeneous uptake within the thyroid gland could represent resolving subacute thyroiditis in patient with a normal uptake.  2. Cannot exclude a multinodular goiter if TSH continues to remain depressed  We obtained a thyroid uptake and scan (11/30/2018): Normal uptake but multinodular goiter with warm nodules at inferior thyroid poles:  4 hour I-123 uptake = 8.6% (normal 5-20%) 24 hour I-123 uptake = 25.3% (normal 10-30%)   IMPRESSION: Normal 4 hour and 24 hour radio iodine uptakes.   Multinodular thyroid gland with warm nodules at the inferior poles of both thyroid lobes, corresponding to dominant masses identified on a remote thyroid ultrasound consistent with adenomas.  No FH of thyroid disease or thyroid cancer. No h/o radiation tx to head or neck. No  herbal supplements. No Biotin use.  No steroids.  Multinodular goiter per ultrasound from 06/28/2013.   Biopsies were benign on 07/13/2013.  Pt denies: - feeling nodules in neck - hoarseness - dysphagia - choking  DM2: -Diagnosed: 9092 - insulin started: ~2006  Reviewed history: Her diabetes became uncontrolled after her first laryngeal surgery in 03/2020.  She was on steroids at the time of her surgery.  Reviewed HbA1c levels: Lab Results  Component Value Date   HGBA1C 9.6 (H)  07/28/2023   HGBA1C 8.2 (H) 12/30/2022   HGBA1C 7.7 (A) 09/01/2022   HGBA1C 7.2 (A) 05/01/2022   HGBA1C 7.7 (H) 12/31/2021   HGBA1C 7.5 (A) 09/09/2021   HGBA1C 7.7 (H) 05/01/2021   HGBA1C 7.5 (A) 01/14/2021   HGBA1C 7.7 (A) 10/15/2020   HGBA1C 9.1 (A) 08/23/2020   She is on: - Trulicity 0.75 >> 1.5 >> 3 mg weekly -gets these through patient assistance >> Ozempic 0.5 mg weekly - Basaglar 16 >> ...18 >> 20 >> 24 units in a.m. (but Basaglar not covered anymore by insurance -they cover Lantus/Toujeo/Tresiba) She had GI intolerance with regular Metformin.  She was then on metformin ER 500 mg - but stopped 05/2023 due to abdominal pain and diarrhea. She was also on Prandin 1 to 2 mg before dinner, added 08/2022, but she was not taking this at our visit from 07/2023. Tried [place] >> $$$. She was previously on Janumet.  Pt checks her sugars once a day: - am:  94-148 >> 102-161 >> 140-200 >> 105-193, 203, 245 (steak) - 2h after b'fast: n/c - before lunch: n/c >> 154 >> n/c - 2h after lunch: 154, 159 >> 83-216, 229 >> n/c >> 154 >> n/c - before dinner: n/c >> 211 >> n/c - 2h after dinner: n/c - bedtime: 252 >> 133-229, 237 >> n/c >> 128-282, 305 >> n/c - nighttime: n/c>> 79, 102 Lowest sugar was 63 >> .[name] Kitchen..  102 >> 140 >> 79; she has hypoglycemia awareness at 70.  Highest sugar was 400s >> .[name] Kitchen.305 >> ... 200s >> 245.  Glucometer: True Metrix  Pt's meals are: - Breakfast: eggs, [place] sausage or oatmeal >> coffee  - Lunch: salad + eggs + [place] meat (no pork) >> sandwich - Dinner: veggies + chicken/turkey and fish - Snacks: not after sx  -+ Mild CKD, last BUN/creatinine:  Lab Results  Component Value Date   BUN 13 07/28/2023   BUN 13 02/18/2023   CREATININE 1.14 07/28/2023   CREATININE 1.10 02/18/2023   Lab Results  Component Value Date   MICRALBCREAT 21.4 07/28/2023   MICRALBCREAT 26.3 12/30/2022   MICRALBCREAT 1.6 05/01/2021   MICRALBCREAT 7 06/03/2019    MICRALBCREAT 14.1 03/29/2018  On losartan 50.  Bridget Butler was suggested by PCP in 07/2023, but she mentions that this was too expensive and did not start.  -+ HL; last set of lipids: Lab Results  Component Value Date   CHOL 207 (H) 07/28/2023   HDL 72.30 07/28/2023   LDLCALC 99 07/28/2023   TRIG 178.0 (H) 07/28/2023   CHOLHDL 3 07/28/2023  On pravastatin 40.  - last eye exam was on 06/24/2023: + DR, stable reportedly. Sees retina specialist. Dr. Stephannie Li.  -+ Improved numbness and tingling in her feet.  Last foot exam 12/30/2022.  On ASA 81  Pt has FH of DM in mother.  She also has a history of GERD, osteopenia, microlaryngoscopy for subglottic stenosis 06 and 06/2020.  ROS:  + see HPI  I reviewed pt's medications, allergies, PMH, social hx, family hx, and changes were documented in the history of present illness. Otherwise, unchanged from my initial visit note.  Past Medical History:  Diagnosis Date   Cataract    Diabetes mellitus without complication (HCC)    Dyspnea    on exertion   GERD (gastroesophageal reflux disease)    Hyperlipidemia    Hypertension    Multiple thyroid nodules    Thyroid disease    Past Surgical History:  Procedure Laterality Date   CHOLECYSTECTOMY     COSMETIC SURGERY Bilateral    eyelids   EYE SURGERY     GALLBLADDER SURGERY     MICROLARYNGOSCOPY WITH CO2 LASER AND EXCISION OF VOCAL CORD LESION N/A 04/02/2020   Procedure: MICROLARYNGOSCOPY WITH DILATION AND CO2 LASER AND EXCISION OF VOCAL CORD LESION w/MITOMYCIN C;  Surgeon: Christia Reading, MD;  Location: [facility] OR;  Service: ENT;  Laterality: N/A;   MICROLARYNGOSCOPY WITH DILATION N/A 05/23/2020   Procedure: MICROLARYNGOSCOPY W/ DILATION AND JET VENTILATION;  Surgeon: Christia Reading, MD;  Location: [facility] OR;  Service: ENT;  Laterality: N/A;   MICROLARYNGOSCOPY WITH DILATION N/A 07/13/2020   Procedure: MICROLARYNGOSCOPY with Jet Ventilation;  Surgeon: Christia Reading, MD;  Location: [facility] OR;  Service:  ENT;  Laterality: N/A;   TRACHEOSTOMY TUBE PLACEMENT N/A 04/02/2020   Procedure: Awake TRACHEOSTOMY;  Surgeon: Christia Reading, MD;  Location: [facility] OR;  Service: ENT;  Laterality: N/A;   TUBAL LIGATION     Social History   Socioeconomic History   Marital status: Widowed    Spouse name: Not on file   Number of children: 3   Years of education: 5   Highest education level: Not on file  Occupational History   Occupation: Retired  Tobacco Use   Smoking status: Never   Smokeless tobacco: Never  Vaping Use   Vaping status: Never Used  Substance and Sexual Activity   Alcohol use: No   Drug use: No   Sexual activity: Not Currently    Partners: Male  Other Topics Concern   Not on file  Social History Narrative   Bridget Butler is a 78 year old Jehovah witness patient    She receives support from her daughter primarily who also assists with transportation to medical appointments.    She is presently independent with her ADLs and iADLs      Lives with son and daughter      Social Drivers of Health   Financial Resource Strain: Low Risk  (09/04/2023)   Overall Financial Resource Strain (CARDIA)    Difficulty of Paying Living Expenses: Not hard at all  Food Insecurity: No Food Insecurity (09/04/2023)   Hunger Vital Sign    Worried About Running Out of Food in the Last Year: Never true    Ran Out of Food in the Last Year: Never true  Transportation Needs: No Transportation Needs (09/04/2023)   PRAPARE - Administrator, Civil Service (Medical): No    Lack of Transportation (Non-Medical): No  Physical Activity: Insufficiently Active (09/04/2023)   Exercise Vital Sign    Days of Exercise per Week: 5 days    Minutes of Exercise per Session: 20 min  Stress: No Stress Concern Present (09/04/2023)   [employer] of Occupational Health - Occupational Stress Questionnaire    Feeling of Stress : Not at all  Social Connections: Moderately Integrated (09/04/2023)   Social  Connection and Isolation Panel [NHANES]    Frequency of  Communication with Friends and Family: More than three times a week    Frequency of Social Gatherings with Friends and Family: More than three times a week    Attends Religious Services: More than 4 times per year    Active Member of [employer] or Organizations: Yes    Attends Banker Meetings: 1 to 4 times per year    Marital Status: Widowed  Intimate Partner Violence: Patient Unable To Answer (09/04/2023)   Humiliation, Afraid, Rape, and Kick questionnaire    Fear of Current or Ex-Partner: Patient unable to answer    Emotionally Abused: Patient unable to answer    Physically Abused: Patient unable to answer    Sexually Abused: Patient unable to answer   Current Outpatient Medications on File Prior to Visit  Medication Sig Dispense Refill   alendronate (FOSAMAX) 70 MG tablet Take 1 tablet (70 mg total) by mouth once a week. Take with a full glass of water on an empty stomach. (Patient not taking: Reported on 09/30/2023) 4 tablet 2   Blood Glucose Monitoring Suppl (TRUE METRIX METER) DEVI 1 Device by Does not apply route 2 (two) times daily. 1 each 2   Cholecalciferol 50 MCG (2000 UT) TABS Take 1 tablet (2,000 Units total) by mouth daily. 90 tablet 1   Continuous Glucose Sensor (FREESTYLE LIBRE 3 PLUS SENSOR) MISC 1 each by Does not apply route every 14 (fourteen) days. 6 each 3   Dulaglutide (TRULICITY) 3 MG/0.5ML SOAJ Inject 3 mg into the skin once a week. 6 mL 3   glucose blood (TRUE METRIX BLOOD GLUCOSE TEST) test strip 1 each by Other route 2 (two) times daily. USE AS INSTRUCTED TO CHECK BLOOD SUGAR ONE TIME DAILY 100 strip 4   Insulin Glargine (BASAGLAR KWIKPEN) 100 UNIT/ML Inject 24 Units into the skin daily. (Patient taking differently: Inject 24 Units into the skin daily. 20 units in the AM and 10 units in the PM) 30 mL 2   Insulin Pen Needle 32G X 6 MM MISC 1 Act by Does not apply route once a week. 100 each 1    meclizine (ANTIVERT) 12.5 MG tablet Take 1 tablet (12.5 mg total) by mouth 3 (three) times daily as needed for dizziness. (Patient not taking: Reported on 09/09/2023) 30 tablet 1   methimazole (TAPAZOLE) 5 MG tablet Take 1 tablet (5 mg total) by mouth daily. 45 tablet 3   Multiple Vitamin (MULTIVITAMIN WITH MINERALS) TABS tablet Take 1 tablet by mouth daily.     olmesartan (BENICAR) 20 MG tablet Take 1 tablet by mouth once daily 90 tablet 0   omeprazole (PRILOSEC) 40 MG capsule TAKE 1 CAPSULE BY MOUTH TWICE DAILY --  IN  THE  MORNING  AND  AT  BEDTIME (Patient taking differently: Take 40 mg by mouth daily. TAKE 1 CAPSULE BY MOUTH TWICE DAILY --  IN  THE  MORNING  AND  AT  BEDTIME) 180 capsule 0   potassium chloride (KLOR-CON 10) 10 MEQ tablet Take 1 tablet (10 mEq total) by mouth 2 (two) times daily. 180 tablet 1   pravastatin (PRAVACHOL) 40 MG tablet Take 1 tablet (40 mg total) by mouth at bedtime. 90 tablet 1   repaglinide (PRANDIN) 1 MG tablet Take 1 tablet (1 mg total) by mouth daily before supper. 90 tablet 3   No current facility-administered medications on file prior to visit.   Allergies  Allergen Reactions   Metformin And Related Diarrhea   Family History  Problem Relation Age of Onset   Heart disease Mother    Hypertension Mother    Cancer Sister    Diabetes Sister    PE: BP 130/70   Pulse 96   Ht 5\' 3"  (1.6 m)   Wt 195 lb 6.4 oz (88.6 kg)   SpO2 98%   BMI 34.61 kg/m   Wt Readings from Last 10 Encounters:  12/24/23 195 lb 6.4 oz (88.6 kg)  09/04/23 187 lb (84.8 kg)  08/20/23 187 lb 6.4 oz (85 kg)  07/28/23 188 lb (85.3 kg)  07/01/23 191 lb (86.6 kg)  05/04/23 190 lb (86.2 kg)  02/18/23 186 lb (84.4 kg)  01/01/23 189 lb (85.7 kg)  12/30/22 186 lb (84.4 kg)  09/01/22 186 lb 9.6 oz (84.6 kg)   Constitutional: overweight, in NAD Eyes: EOMI, no exophthalmos ENT: no thyromegaly, no thyroid masses palpated, no cervical lymphadenopathy Cardiovascular: Tachycardia, RR,  No MRG, +B LE edema Respiratory: CTA B Musculoskeletal: no deformities Skin: no rashes Neurological: no tremor with outstretched hands  ASSESSMENT: 1. DM2-insulin dependent, uncontrolled, with complications - CKD - DR - PN  2. Thyrotoxic adenomas  3. Multiple thyroid nodules  PLAN:  1. DM2 -Patient with longstanding, uncontrolled, type 2 diabetes, on long-acting insulin and weekly GLP-1 receptor agonist, with still poor control.  At last visit, HbA1c was higher, at 9.6%.  At that time, we increased her Basaglar dose and I recommended to change from Trulicity to Ozempic.  Her Ozempic shipment was delayed from the patient assistance program so we gave her samples.  I again advised her to try to check sugars later in the day and rotate the check times as she was only checking in the morning and these sugars were higher, all above target.  I did suggest a CGM, also. -At today's visit, sugars remain above target, but she does have some values at goal in the morning.  She is not checking later in the day except for nighttime when she had 1 blood sugar at 79, but otherwise no lows.  However, she did obtain the CGM and this was connected for her today. -At today's visit, per insurance preference, we will change from Basaglar to Lantus.  She just started Ozempic at 0.25 mg weekly and she had 2 doses, tolerated well.  I advised her to increase the dose to 0.5 mg weekly. - I advised the pt to: Patient Instructions  Please change: - Basaglar to Lantus 24 units in am  Increase: - Ozempic 0.5 mg weekly   Please stop at the lab.  Please stay off methimazole.    Please return in 3-4 months.   - we checked her HbA1c: 8.7% (lower) - advised to check sugars at different times of the day - 4x a day, rotating check times - advised for yearly eye exams >> she is UTD - she will need a foot exam at next visit - return to clinic in 3-4 months  2.  Patient with a history of low TSH without thyrotoxic  signs or symptoms: She denies weight loss, heat intolerance, hyperdefecation, palpitations, anxiety -Her thyroid uptake and scan showed toxic adenomas in the inferior thyroid lobes -She continues to have a mildly suppressed TSH with normal free thyroid hormones.  She previously had a very low TSH at 0.03 and we discussed about RAI treatment, however, TFTs improved afterwards.  In 2023, TSH was still suppressed, I suggested to add a low-dose methimazole, 2.5 mg daily.  She was taking the  medication inconsistently in the past and she was off at last 2 visits. -The TSH was only mildly low and she had no symptoms: Lab Results  Component Value Date   TSH 0.36 (L) 07/28/2023  -At last visit we decided not to restart this but to keep a close eye on her TFTs -No thyrotoxic symptoms, but she is tachycardic at today's visit. -Will recheck her TFTs today to see if we need to restart methimazole  3. Multiple thyroid nodules -She denies neck compression symptoms and she has no masses palpated in her neck today  - 2 of the nodules were biopsied with benign results in 2014 -Per the thyroid uptake and scan report from 2020, she had warm nodules in the lower poles of the thyroid, but without indication for biopsy -We are managing her conservatively  Orders Placed This Encounter  Procedures   TSH   T4, free   T3, free   POCT glycosylated hemoglobin (Hb A1C)   Component     Latest Ref Rng 12/24/2023  Triiodothyronine,Free,Serum     2.3 - 4.2 pg/mL 3.4   TSH     0.40 - 4.50 mIU/L 0.44   T4,Free(Direct)     0.8 - 1.8 ng/dL 1.0   Normal TFTs.  No need to restart methimazole.  Bridget Pavlov, MD PhD [facility]

## 2023-12-24 NOTE — Patient Instructions (Addendum)
 Please change: - Basaglar to Lantus 24 units in am  Increase: - Ozempic 0.5 mg weekly   Please stop at the lab.  Please stay off methimazole.    Please return in 3-4 months.

## 2023-12-25 ENCOUNTER — Encounter: Payer: Self-pay | Admitting: Internal Medicine

## 2023-12-25 LAB — T4, FREE: Free T4: 1 ng/dL (ref 0.8–1.8)

## 2023-12-25 LAB — TSH: TSH: 0.44 m[IU]/L (ref 0.40–4.50)

## 2023-12-25 LAB — T3, FREE: T3, Free: 3.4 pg/mL (ref 2.3–4.2)

## 2023-12-28 ENCOUNTER — Other Ambulatory Visit: Payer: Self-pay | Admitting: Internal Medicine

## 2023-12-28 ENCOUNTER — Other Ambulatory Visit: Payer: Medicare Other

## 2023-12-28 NOTE — Telephone Encounter (Unsigned)
 Copied from CRM [id number]. Topic: Clinical - Medication Refill >> Dec 28, 2023  1:04 PM Desma Mcgregor wrote: Most Recent Primary Care Visit:  Provider: Candy Sledge R  Department: [facility]  Visit Type: PATIENT OUTREACH 30  Date: 11/23/2023  Medication: amLODipine (NORVASC) 5 MG tablet  Has the patient contacted their pharmacy? No, but has no refills on file. Dr. Jonny Ruiz prescribed them last.  Is this the correct pharmacy for this prescription? Yes If no, delete pharmacy and type the correct one.  This is the patient's preferred pharmacy:  [facility] [address] ([address] [place] - [address] [place] ([place]) [place] [address] Phone: [phone] Fax: [phone]  Has the prescription been filled recently? No  Is the patient out of the medication? No has 4 days left   Has the patient been seen for an appointment in the last year OR does the patient have an upcoming appointment? Yes  Can we respond through MyChart? Yes  Agent: Please be advised that Rx refills may take up to 3 business days. We ask that you follow-up with your pharmacy.

## 2023-12-28 NOTE — Telephone Encounter (Signed)
 Last Fill: 07/01/23  Last OV: 09/04/23 AWV Next OV: 09/05/24  Routing to provider for review/authorization.

## 2023-12-29 MED ORDER — AMLODIPINE BESYLATE 5 MG PO TABS: 5.0000 mg | ORAL_TABLET | Freq: Every day | ORAL | 0 refills | Status: DC

## 2023-12-31 ENCOUNTER — Other Ambulatory Visit: Payer: Self-pay | Admitting: Internal Medicine

## 2023-12-31 NOTE — Telephone Encounter (Signed)
 Good morning!  Was this decided on? Thanks in advance

## 2024-01-04 ENCOUNTER — Encounter: Payer: Self-pay | Admitting: Internal Medicine

## 2024-01-04 ENCOUNTER — Ambulatory Visit (INDEPENDENT_AMBULATORY_CARE_PROVIDER_SITE_OTHER): Admitting: Internal Medicine

## 2024-01-04 VITALS — BP 122/68 | HR 90 | Temp 97.8°F | Ht 63.0 in | Wt 190.0 lb

## 2024-01-04 DIAGNOSIS — N950 Postmenopausal bleeding: Secondary | ICD-10-CM | POA: Diagnosis not present

## 2024-01-04 DIAGNOSIS — E0590 Thyrotoxicosis, unspecified without thyrotoxic crisis or storm: Secondary | ICD-10-CM

## 2024-01-04 DIAGNOSIS — N1831 Chronic kidney disease, stage 3a: Secondary | ICD-10-CM | POA: Diagnosis not present

## 2024-01-04 DIAGNOSIS — I152 Hypertension secondary to endocrine disorders: Secondary | ICD-10-CM | POA: Diagnosis not present

## 2024-01-04 DIAGNOSIS — D649 Anemia, unspecified: Secondary | ICD-10-CM

## 2024-01-04 DIAGNOSIS — E1159 Type 2 diabetes mellitus with other circulatory complications: Secondary | ICD-10-CM

## 2024-01-04 LAB — CBC WITH DIFFERENTIAL/PLATELET
Basophils Absolute: 0.1 10*3/uL (ref 0.0–0.1)
Basophils Relative: 1.1 % (ref 0.0–3.0)
Eosinophils Absolute: 0.1 10*3/uL (ref 0.0–0.7)
Eosinophils Relative: 0.9 % (ref 0.0–5.0)
HCT: 34.9 % — ABNORMAL LOW (ref 36.0–46.0)
Hemoglobin: 11.2 g/dL — ABNORMAL LOW (ref 12.0–15.0)
Lymphocytes Relative: 40.5 % (ref 12.0–46.0)
Lymphs Abs: 2.4 10*3/uL (ref 0.7–4.0)
MCHC: 32 g/dL (ref 30.0–36.0)
MCV: 98.1 fl (ref 78.0–100.0)
Monocytes Absolute: 0.4 10*3/uL (ref 0.1–1.0)
Monocytes Relative: 6.8 % (ref 3.0–12.0)
Neutro Abs: 3 10*3/uL (ref 1.4–7.7)
Neutrophils Relative %: 50.7 % (ref 43.0–77.0)
Platelets: 326 10*3/uL (ref 150.0–400.0)
RBC: 3.56 Mil/uL — ABNORMAL LOW (ref 3.87–5.11)
RDW: 14.3 % (ref 11.5–15.5)
WBC: 5.9 10*3/uL (ref 4.0–10.5)

## 2024-01-04 LAB — BASIC METABOLIC PANEL
BUN: 12 mg/dL (ref 6–23)
CO2: 28 meq/L (ref 19–32)
Calcium: 10 mg/dL (ref 8.4–10.5)
Chloride: 101 meq/L (ref 96–112)
Creatinine, Ser: 1.05 mg/dL (ref 0.40–1.20)
GFR: 51.18 mL/min — ABNORMAL LOW (ref >60.00)
Glucose, Bld: 207 mg/dL — ABNORMAL HIGH (ref 70–99)
Potassium: 4.1 meq/L (ref 3.5–5.1)
Sodium: 136 meq/L (ref 135–145)

## 2024-01-04 LAB — T4, FREE: Free T4: 0.81 ng/dL (ref 0.60–1.60)

## 2024-01-04 LAB — FERRITIN: Ferritin: 63 ng/mL (ref 10.0–291.0)

## 2024-01-04 LAB — IBC PANEL
Iron: 69 ug/dL (ref 42–145)
Saturation Ratios: 21.2 % (ref 20.0–50.0)
TIBC: 326.2 ug/dL (ref 250.0–450.0)
Transferrin: 233 mg/dL (ref 212.0–360.0)

## 2024-01-04 LAB — TSH: TSH: 0.36 u[IU]/mL (ref 0.35–5.50)

## 2024-01-04 LAB — T3, FREE: T3, Free: 3.4 pg/mL (ref 2.3–4.2)

## 2024-01-04 MED ORDER — OLMESARTAN MEDOXOMIL 20 MG PO TABS: 20.0000 mg | ORAL_TABLET | Freq: Every day | ORAL | 0 refills | Status: DC

## 2024-01-04 NOTE — Assessment & Plan Note (Signed)
 BP Readings from Last 3 Encounters:  01/04/24 122/68  12/24/23 130/70  08/28/23 (!) 131/58   Stable, pt to continue medical treatment norvasc 5 every day, benicar 20 qd

## 2024-01-04 NOTE — Assessment & Plan Note (Signed)
 Has hx of anemia chronic dz, also for f/u cbc  and iron labs today

## 2024-01-04 NOTE — Patient Instructions (Addendum)
 Please continue all other medications as before, and refills have been done if requested.  Please have the pharmacy call with any other refills you may need.  Please keep your appointments with your specialists as you may have planned - Dr Lafe Garin for the sugar and thyroid  You will be contacted regarding the referral for: Pelvic ultraound with transvaginal  Please go to the LAB at the blood drawing area for the tests to be done  You will be contacted by phone if any changes need to be made immediately.  Otherwise, you will receive a letter about your results with an explanation, but please check with MyChart first.

## 2024-01-04 NOTE — Assessment & Plan Note (Signed)
 Lab Results  Component Value Date   CREATININE 1.05 01/04/2024   Stable overall, cont to avoid nephrotoxins

## 2024-01-04 NOTE — Assessment & Plan Note (Signed)
 Unusual, can't r/o malignancy uterus or other, pt to f/u with GYN , will check pelvic u/s with transvag u/s, and if abnormal will need gyn oncology referral

## 2024-01-04 NOTE — Progress Notes (Signed)
 Patient ID: Bridget Butler, female   DOB: [birth year], 78 y.o.   MRN: [id number]        Chief Complaint: follow up post menopausal bleeding, anemia, ckd3a, htn       HPI:  Bridget Butler is a 78 y.o. female here with c/o recent onset < 1 wk mild intermittent bloody vaginal blood loss, happened briefly about 2 yrs ago per pt , but not sure of the eval then. Has not seen GYN recently.  Pt denies chest pain, increased sob or doe, wheezing, orthopnea, PND, increased LE swelling, palpitations, dizziness or syncope.   Pt denies polydipsia, polyuria, or new focal neuro s/s.    Pt denies fever, wt loss, night sweats, loss of appetite, or other constitutional symptoms  CT abd pelvis 2024 shows intact uterus.  Wt Readings from Last 3 Encounters:  01/04/24 190 lb (86.2 kg)  12/24/23 195 lb 6.4 oz (88.6 kg)  09/04/23 187 lb (84.8 kg)   BP Readings from Last 3 Encounters:  01/04/24 122/68  12/24/23 130/70  08/28/23 (!) 131/58         Past Medical History:  Diagnosis Date   Cataract    Diabetes mellitus without complication (HCC)    Dyspnea    on exertion   GERD (gastroesophageal reflux disease)    Hyperlipidemia    Hypertension    Multiple thyroid nodules    Thyroid disease    Past Surgical History:  Procedure Laterality Date   CHOLECYSTECTOMY     COSMETIC SURGERY Bilateral    eyelids   EYE SURGERY     GALLBLADDER SURGERY     MICROLARYNGOSCOPY WITH CO2 LASER AND EXCISION OF VOCAL CORD LESION N/A 04/02/2020   Procedure: MICROLARYNGOSCOPY WITH DILATION AND CO2 LASER AND EXCISION OF VOCAL CORD LESION w/MITOMYCIN C;  Surgeon: Christia Reading, MD;  Location: [facility] OR;  Service: ENT;  Laterality: N/A;   MICROLARYNGOSCOPY WITH DILATION N/A 05/23/2020   Procedure: MICROLARYNGOSCOPY W/ DILATION AND JET VENTILATION;  Surgeon: Christia Reading, MD;  Location: [facility] OR;  Service: ENT;  Laterality: N/A;   MICROLARYNGOSCOPY WITH DILATION N/A 07/13/2020   Procedure: MICROLARYNGOSCOPY with Jet Ventilation;  Surgeon: Christia Reading, MD;  Location: [facility] OR;  Service: ENT;  Laterality: N/A;   TRACHEOSTOMY TUBE PLACEMENT N/A 04/02/2020   Procedure: Awake TRACHEOSTOMY;  Surgeon: Christia Reading, MD;  Location: [facility] OR;  Service: ENT;  Laterality: N/A;   TUBAL LIGATION      reports that she has never smoked. She has never used smokeless tobacco. She reports that she does not drink alcohol and does not use drugs. family history includes Cancer in her sister; Diabetes in her sister; Heart disease in her mother; Hypertension in her mother. Allergies  Allergen Reactions   Metformin And Related Diarrhea   Current Outpatient Medications on File Prior to Visit  Medication Sig Dispense Refill   alendronate (FOSAMAX) 70 MG tablet Take 1 tablet (70 mg total) by mouth once a week. Take with a full glass of water on an empty stomach. 4 tablet 2   amLODipine (NORVASC) 5 MG tablet Take 1 tablet (5 mg total) by mouth daily. 90 tablet 0   Blood Glucose Monitoring Suppl (TRUE METRIX METER) DEVI 1 Device by Does not apply route 2 (two) times daily. 1 each 2   Cholecalciferol 50 MCG (2000 UT) TABS Take 1 tablet (2,000 Units total) by mouth daily. 90 tablet 1   Continuous Glucose Sensor (FREESTYLE LIBRE 3 PLUS SENSOR) MISC 1 each  by Does not apply route every 14 (fourteen) days. 6 each 3   Dulaglutide (TRULICITY) 3 MG/0.5ML SOAJ Inject 3 mg into the skin once a week. 6 mL 3   glucose blood (TRUE METRIX BLOOD GLUCOSE TEST) test strip 1 each by Other route 2 (two) times daily. USE AS INSTRUCTED TO CHECK BLOOD SUGAR ONE TIME DAILY 100 strip 4   insulin glargine (LANTUS SOLOSTAR) 100 UNIT/ML Solostar Pen Inject 24 Units into the skin daily. 30 mL 3   Insulin Pen Needle 32G X 6 MM MISC 1 Act by Does not apply route once a week. 100 each 1   meclizine (ANTIVERT) 12.5 MG tablet Take 1 tablet (12.5 mg total) by mouth 3 (three) times daily as needed for dizziness. 30 tablet 1   methimazole (TAPAZOLE) 5 MG tablet Take 1 tablet (5 mg total) by mouth daily.  45 tablet 3   Multiple Vitamin (MULTIVITAMIN WITH MINERALS) TABS tablet Take 1 tablet by mouth daily.     omeprazole (PRILOSEC) 40 MG capsule TAKE 1 CAPSULE BY MOUTH TWICE DAILY --  IN  THE  MORNING  AND  AT  BEDTIME (Patient taking differently: Take 40 mg by mouth daily. TAKE 1 CAPSULE BY MOUTH TWICE DAILY --  IN  THE  MORNING  AND  AT  BEDTIME) 180 capsule 0   potassium chloride (KLOR-CON 10) 10 MEQ tablet Take 1 tablet (10 mEq total) by mouth 2 (two) times daily. 180 tablet 1   pravastatin (PRAVACHOL) 40 MG tablet Take 1 tablet (40 mg total) by mouth at bedtime. 90 tablet 1   repaglinide (PRANDIN) 1 MG tablet Take 1 tablet (1 mg total) by mouth daily before supper. 90 tablet 3   No current facility-administered medications on file prior to visit.        ROS:  All others reviewed and negative.  Objective        PE:  BP 122/68 (BP Location: Left Arm, Patient Position: Sitting, Cuff Size: Normal)   Pulse 90   Temp 97.8 F (36.6 C) (Oral)   Ht 5\' 3"  (1.6 m)   Wt 190 lb (86.2 kg)   SpO2 99%   BMI 33.66 kg/m                 Constitutional: Pt appears in NAD               HENT: Head: NCAT.                Right Ear: External ear normal.                 Left Ear: External ear normal.                Eyes: . Pupils are equal, round, and reactive to light. Conjunctivae and EOM are normal               Nose: without d/c or deformity               Neck: Neck supple. Gross normal ROM               Cardiovascular: Normal rate and regular rhythm.                 Pulmonary/Chest: Effort normal and breath sounds without rales or wheezing.                Abd:  Soft, NT, ND, + BS, no organomegaly  Neurological: Pt is alert. At baseline orientation, motor grossly intact               Skin: Skin is warm. No rashes, no other new lesions, LE edema - none               Psychiatric: Pt behavior is normal without agitation   Micro: none  Cardiac tracings I have personally interpreted  today:  none  Pertinent Radiological findings (summarize): none   Lab Results  Component Value Date   WBC 5.9 01/04/2024   HGB 11.2 (L) 01/04/2024   HCT 34.9 (L) 01/04/2024   PLT 326.0 01/04/2024   GLUCOSE 207 (H) 01/04/2024   CHOL 207 (H) 07/28/2023   TRIG 178.0 (H) 07/28/2023   HDL 72.30 07/28/2023   LDLCALC 99 07/28/2023   ALT 20 07/28/2023   AST 17 07/28/2023   NA 136 01/04/2024   K 4.1 01/04/2024   CL 101 01/04/2024   CREATININE 1.05 01/04/2024   BUN 12 01/04/2024   CO2 28 01/04/2024   TSH 0.36 01/04/2024   HGBA1C 8.7 (A) 12/24/2023   MICROALBUR 15.7 (H) 07/28/2023   Assessment/Plan:  Mikahla Wisor Musil is a 78 y.o. Black or African American [2] female with  has a past medical history of Cataract, Diabetes mellitus without complication (HCC), Dyspnea, GERD (gastroesophageal reflux disease), Hyperlipidemia, Hypertension, Multiple thyroid nodules, and Thyroid disease.  Anemia Has hx of anemia chronic dz, also for f/u cbc  and iron labs today  Hypertension associated with diabetes (HCC) BP Readings from Last 3 Encounters:  01/04/24 122/68  12/24/23 130/70  08/28/23 (!) 131/58   Stable, pt to continue medical treatment norvasc 5 every day, benicar 20 qd   Post-menopausal bleeding Unusual, can't r/o malignancy uterus or other, pt to f/u with GYN , will check pelvic u/s with transvag u/s, and if abnormal will need gyn oncology referral  Stage 3a chronic kidney disease (HCC) Lab Results  Component Value Date   CREATININE 1.05 01/04/2024   Stable overall, cont to avoid nephrotoxins  Followup: Return if symptoms worsen or fail to improve.  Oliver Barre, MD 01/04/2024 9:16 PM [facility]

## 2024-01-05 ENCOUNTER — Encounter: Payer: Self-pay | Admitting: Internal Medicine

## 2024-01-08 ENCOUNTER — Telehealth: Payer: Self-pay | Admitting: Pharmacy Technician

## 2024-01-08 NOTE — Progress Notes (Signed)
   01/08/2024 Name: Bridget Butler MRN: [id number] DOB: 1945-10-30  Patient is appearing on a report for [employer] [place] Metric Diabetes and last engaged with the clinical pharmacist to discuss diabetes on 11/23/2023.   Contacted patient today to discuss diabetes management and completed medication review.   Diabetes Plan from last clinical pharmacist appointment:   Currently uncontrolled, A1c goal <8% - Reviewed goal fasting, and goal 2 hour post prandial glucose - Reviewed dietary modifications including increasing protein and fiber, focusing on balanced snacks/meals, carb heavy foods in moderation, reducing soda consumption - Continue currently regimen, could increase Basaglar if fasting remains elevated - Send Freestyle Libre 3 - this was approved on her previous insurance at the end of last year but she never received it - Doctor, general practice pharmacy to run the prescription for [place] 3 plus - went through for $0 copay, pt informed she can go pick it up   Medication Adherence Barriers Identified:  Patient made recommended medication changes per plan: Yes Patient informs she has been on the Ozempic 0.5mg  dose for approximately 3 weeks and is tolerating with no GI side effects noted.She also informs that she has noticed that she is not snacking as much and does not go back for seconds when eating a meal. She informs she will fill up her plate once and that satisfies her now. She also informs no cost concerns with Ozempic. Patient also informs she is NOT taking Prandin and would like that removed from her medication list. Access issues with any new medication or testing device: Yes She informs provider switched from Basaglar to Lantus and the Lantus was around $75 for 2 boxes  (5 pens in each box) which is somewhat cost prohibitive for the patient.  Will request PharmD refer for PAP. Patient is checking blood sugars as prescribed: Yes Patient informs she is using Freestyle Libre CGM to monitor blood sugars. She  informs the blood sugars are around 120-125 before eating a meal and then typically they rise to the 200s about an hour after eating. She informs the highest reading she has had was 352.   Medication Adherence Barriers Addressed/Actions Taken:  Reviewed medication changes per plan from last clinical pharmacist note Medication Access for Lantus Will discuss medication access concerns with pharmacist  Reviewed instructions for monitoring blood sugars at home and reminded patient to keep a written log to review with pharmacist Reminded patient of date/time of upcoming clinical pharmacist follow up and any upcoming PCP/specialists visits. Patient denies transportation barriers to the appointment. Yes Patient informs she is able to drive herself to her appointments.  Patient informs she saw PCP on 01/04/24 due to some vaginal discharge she is having and informs Dr. Yetta Barre placed a referral for GYN but she has not heard from them yet. Advised patient to follow up next week about the referral with the office.  Next Endocrinology  appointment is scheduled for: 04/27/24 Next PCP appointment is scheduled for : 09/05/24   Pattricia Boss, CPhT [place]  Office: [phone] Fax: [phone] Email: [email or web address]

## 2024-01-12 ENCOUNTER — Telehealth: Payer: Self-pay

## 2024-01-12 NOTE — Telephone Encounter (Signed)
 Sample  Medication:Ozempic Dose: 0.25/0.5 mg Quantity:1 box [id number] EXP:05/19/26

## 2024-01-14 ENCOUNTER — Telehealth: Payer: Self-pay | Admitting: Internal Medicine

## 2024-01-14 NOTE — Telephone Encounter (Signed)
Patient came in to office today and picked up sample of Ozempic.

## 2024-01-14 NOTE — Telephone Encounter (Unsigned)
 Copied from CRM [id number]. Topic: General - Other >> Jan 14, 2024  2:35 PM Shereese L wrote: Reason for CRM: patient stated that she needs a referral for the specialist

## 2024-01-18 ENCOUNTER — Other Ambulatory Visit: Payer: Self-pay | Admitting: Internal Medicine

## 2024-01-18 DIAGNOSIS — E785 Hyperlipidemia, unspecified: Secondary | ICD-10-CM

## 2024-01-18 MED ORDER — PRAVASTATIN SODIUM 40 MG PO TABS: 40.0000 mg | ORAL_TABLET | Freq: Every day | ORAL | 1 refills | Status: DC

## 2024-01-18 NOTE — Telephone Encounter (Signed)
 Copied from CRM [id number]. Topic: Clinical - Medication Refill >> Jan 18, 2024 12:09 PM Aisha D wrote: Most Recent Primary Care Visit:  Provider: Corwin Levins  Department: [facility]  Visit Type: ACUTE  Date: 01/04/2024  Medication: pravastatin (PRAVACHOL) 40 MG tablet  Has the patient contacted their pharmacy? No (Agent: If no, request that the patient contact the pharmacy for the refill. If patient does not wish to contact the pharmacy document the reason why and proceed with request.) (Agent: If yes, when and what did the pharmacy advise?) advised to contact PCP  Is this the correct pharmacy for this prescription? Yes If no, delete pharmacy and type the correct one.  This is the patient's preferred pharmacy:  [facility] [address] ([address]), [place] - [address] [place] ([place]) [place] [address] Phone: [phone] Fax: [phone]   Has the prescription been filled recently? No  Is the patient out of the medication? Yes  Has the patient been seen for an appointment in the last year OR does the patient have an upcoming appointment? Yes  Can we respond through MyChart? Yes  Agent: Please be advised that Rx refills may take up to 3 business days. We ask that you follow-up with your pharmacy.

## 2024-01-21 ENCOUNTER — Telehealth: Payer: Self-pay

## 2024-01-21 NOTE — Telephone Encounter (Signed)
Patient came in to office today and picked up 4 boxes of patient assistance Ozempic.

## 2024-01-21 NOTE — Telephone Encounter (Signed)
 Patient Assistance  Medication:Ozempic Dosage:0.25/0.5mg   Quantity:4 boxes

## 2024-02-03 ENCOUNTER — Encounter: Payer: Self-pay | Admitting: Obstetrics and Gynecology

## 2024-02-03 ENCOUNTER — Ambulatory Visit: Admitting: Obstetrics and Gynecology

## 2024-02-03 VITALS — BP 118/52 | HR 78 | Temp 98.0°F | Ht 63.75 in | Wt 189.0 lb

## 2024-02-03 DIAGNOSIS — N814 Uterovaginal prolapse, unspecified: Secondary | ICD-10-CM | POA: Diagnosis not present

## 2024-02-03 DIAGNOSIS — N3946 Mixed incontinence: Secondary | ICD-10-CM | POA: Diagnosis not present

## 2024-02-03 DIAGNOSIS — N950 Postmenopausal bleeding: Secondary | ICD-10-CM | POA: Diagnosis not present

## 2024-02-03 NOTE — Progress Notes (Signed)
 78 y.o. [id number] female here for PMB. Widowed.  No LMP recorded. Patient is postmenopausal.   She reports spotting on and off for a year.  She had 1 day of heavy bleeding last month.  She is concerned that her bleeding is related to poorly controlled diabetes. Her fastings are between 170 and 200s.  She also notes a vaginal bulge.  Last mammogram: 08/05/23 BIRADS 2 Sexually active: no   GYN HISTORY: No significant history  OB History  Gravida Para Term Preterm AB Living  3 3 3   3   SAB IAB Ectopic Multiple Live Births      2    # Outcome Date GA Lbr Len/2nd Weight Sex Type Anes PTL Lv  3 Term      Vag-Spont   LIV  2 Term      Vag-Spont   LIV  1 Term      Vag-Spont       Past Medical History:  Diagnosis Date   Cataract    Diabetes mellitus without complication (HCC)    Dyspnea    on exertion   GERD (gastroesophageal reflux disease)    Hyperlipidemia    Hypertension    Multiple thyroid nodules    Thyroid disease     Past Surgical History:  Procedure Laterality Date   CHOLECYSTECTOMY     COSMETIC SURGERY Bilateral    eyelids   EYE SURGERY     GALLBLADDER SURGERY     MICROLARYNGOSCOPY WITH CO2 LASER AND EXCISION OF VOCAL CORD LESION N/A 04/02/2020   Procedure: MICROLARYNGOSCOPY WITH DILATION AND CO2 LASER AND EXCISION OF VOCAL CORD LESION w/MITOMYCIN C;  Surgeon: Christia Reading, MD;  Location: [facility] OR;  Service: ENT;  Laterality: N/A;   MICROLARYNGOSCOPY WITH DILATION N/A 05/23/2020   Procedure: MICROLARYNGOSCOPY W/ DILATION AND JET VENTILATION;  Surgeon: Christia Reading, MD;  Location: [facility] OR;  Service: ENT;  Laterality: N/A;   MICROLARYNGOSCOPY WITH DILATION N/A 07/13/2020   Procedure: MICROLARYNGOSCOPY with Jet Ventilation;  Surgeon: Christia Reading, MD;  Location: [facility] OR;  Service: ENT;  Laterality: N/A;   TRACHEOSTOMY TUBE PLACEMENT N/A 04/02/2020   Procedure: Awake TRACHEOSTOMY;  Surgeon: Christia Reading, MD;  Location: [facility] OR;  Service: ENT;  Laterality: N/A;   TUBAL  LIGATION      Current Outpatient Medications on File Prior to Visit  Medication Sig Dispense Refill   alendronate (FOSAMAX) 70 MG tablet Take 1 tablet (70 mg total) by mouth once a week. Take with a full glass of water on an empty stomach. 4 tablet 2   amLODipine (NORVASC) 5 MG tablet Take 1 tablet (5 mg total) by mouth daily. 90 tablet 0   Blood Glucose Monitoring Suppl (TRUE METRIX METER) DEVI 1 Device by Does not apply route 2 (two) times daily. 1 each 2   Cholecalciferol 50 MCG (2000 UT) TABS Take 1 tablet (2,000 Units total) by mouth daily. 90 tablet 1   Continuous Glucose Sensor (FREESTYLE LIBRE 3 PLUS SENSOR) MISC 1 each by Does not apply route every 14 (fourteen) days. 6 each 3   glucose blood (TRUE METRIX BLOOD GLUCOSE TEST) test strip 1 each by Other route 2 (two) times daily. USE AS INSTRUCTED TO CHECK BLOOD SUGAR ONE TIME DAILY 100 strip 4   insulin glargine (LANTUS SOLOSTAR) 100 UNIT/ML Solostar Pen Inject 24 Units into the skin daily. 30 mL 3   Insulin Pen Needle 32G X 6 MM MISC 1 Act by Does not apply route once a  week. 100 each 1   Multiple Vitamin (MULTIVITAMIN WITH MINERALS) TABS tablet Take 1 tablet by mouth daily.     olmesartan (BENICAR) 20 MG tablet Take 1 tablet (20 mg total) by mouth daily. 90 tablet 0   omeprazole (PRILOSEC) 40 MG capsule TAKE 1 CAPSULE BY MOUTH TWICE DAILY --  IN  THE  MORNING  AND  AT  BEDTIME (Patient taking differently: Take 40 mg by mouth daily. TAKE 1 CAPSULE BY MOUTH TWICE DAILY --  IN  THE  MORNING  AND  AT  BEDTIME) 180 capsule 0   potassium chloride (KLOR-CON 10) 10 MEQ tablet Take 1 tablet (10 mEq total) by mouth 2 (two) times daily. 180 tablet 1   pravastatin (PRAVACHOL) 40 MG tablet Take 1 tablet (40 mg total) by mouth at bedtime. 90 tablet 1   Dulaglutide (TRULICITY) 3 MG/0.5ML SOAJ Inject 3 mg into the skin once a week. (Patient not taking: Reported on 02/03/2024) 6 mL 3   meclizine (ANTIVERT) 12.5 MG tablet Take 1 tablet (12.5 mg total) by  mouth 3 (three) times daily as needed for dizziness. (Patient not taking: Reported on 02/03/2024) 30 tablet 1   methimazole (TAPAZOLE) 5 MG tablet Take 1 tablet (5 mg total) by mouth daily. (Patient not taking: Reported on 02/03/2024) 45 tablet 3   repaglinide (PRANDIN) 1 MG tablet Take 1 tablet (1 mg total) by mouth daily before supper. (Patient not taking: Reported on 02/03/2024) 90 tablet 3   No current facility-administered medications on file prior to visit.    Allergies  Allergen Reactions   Metformin And Related Diarrhea      PE Today's Vitals   02/03/24 0918  BP: (!) 118/52  Pulse: 78  Temp: 98 F (36.7 C)  TempSrc: Oral  SpO2: 98%  Weight: 189 lb (85.7 kg)  Height: 5' 3.75" (1.619 m)   Body mass index is 32.7 kg/m.  Physical Exam Vitals reviewed. Exam conducted with a chaperone present.  Constitutional:      General: She is not in acute distress.    Appearance: Normal appearance.  HENT:     Head: Normocephalic and atraumatic.     Nose: Nose normal.  Eyes:     Extraocular Movements: Extraocular movements intact.     Conjunctiva/sclera: Conjunctivae normal.  Pulmonary:     Effort: Pulmonary effort is normal.  Genitourinary:    General: Normal vulva.     Exam position: Lithotomy position.     Vagina: Normal. No vaginal discharge.     Cervix: Normal. No cervical motion tenderness, discharge or lesion.     Uterus: Normal. With uterine prolapse. Not enlarged and not tender.      Adnexa: Right adnexa normal and left adnexa normal.     Comments: Stage 3 prolapse Kegel uncoordinated 0/5 Musculoskeletal:        General: Normal range of motion.     Cervical back: Normal range of motion.  Neurological:     General: No focal deficit present.     Mental Status: She is alert.  Psychiatric:        Mood and Affect: Mood normal.        Behavior: Behavior normal.     Assessment and Plan:        PMB (postmenopausal bleeding)   Reviewed causes of PMB, including  genitourinary syndrome of menopause, infection, trauma, polyps, urinary and GI etiologies, and malignancy. Recommend endometrial assessment with transvaginal ultrasound.  Uterine prolapse  Discussed cystocele. Asymptomatic cystocele  can be managed with weight loss, kegel exercises, PFPT, pessary or surgical management. Will continue discussion at next appt.  MUI Continue to address   Romaine Closs, MD

## 2024-02-04 ENCOUNTER — Telehealth: Payer: Self-pay

## 2024-02-04 NOTE — Patient Outreach (Signed)
 Care Guide Pharmacy Note  02/04/2024 Name: Bridget Butler MRN: [id number] DOB: Mar 09, 1946  Referred By: Arcadio Knuckles, MD Reason for referral: Call Attempt #1   Bridget Butler is a 78 y.o. year old female who is a primary care patient of Arcadio Knuckles, MD.  Bridget Butler was referred to the pharmacist for assistance related to: DMII  An unsuccessful telephone outreach was attempted today to contact the patient who was referred to the pharmacy team for assistance with Disease Management. Additional attempts will be made to contact the patient.  Carter Clare, BSW, CDP [place]  [facility] - [facility] Manager Population Health Direct Dial: [phone]  Fax: [phone]

## 2024-02-23 ENCOUNTER — Ambulatory Visit: Admitting: Obstetrics and Gynecology

## 2024-02-23 ENCOUNTER — Other Ambulatory Visit: Payer: Self-pay | Admitting: Obstetrics and Gynecology

## 2024-02-23 ENCOUNTER — Ambulatory Visit (INDEPENDENT_AMBULATORY_CARE_PROVIDER_SITE_OTHER)

## 2024-02-23 ENCOUNTER — Encounter: Payer: Self-pay | Admitting: Obstetrics and Gynecology

## 2024-02-23 VITALS — BP 124/60 | HR 86 | Temp 98.0°F | Wt 187.0 lb

## 2024-02-23 DIAGNOSIS — N9489 Other specified conditions associated with female genital organs and menstrual cycle: Secondary | ICD-10-CM

## 2024-02-23 DIAGNOSIS — N950 Postmenopausal bleeding: Secondary | ICD-10-CM | POA: Diagnosis not present

## 2024-02-23 DIAGNOSIS — N3946 Mixed incontinence: Secondary | ICD-10-CM

## 2024-02-23 DIAGNOSIS — N814 Uterovaginal prolapse, unspecified: Secondary | ICD-10-CM

## 2024-02-23 NOTE — Patient Instructions (Addendum)
 Preoperative instructions: Nothing to eat or drink after midnight, unless instructed differently regarding clear liquids by the anesthesia team at [facility] health.  Stop ozempic 2 weeks prior to surgery.  Do not take any medications on the day of surgery, except those listed below: Norvasc  Half dose of insulin  glargine Olmestaran (if okay by anesthesia)  Please follow all other instructions as provided by our surgical scheduler at [facility] and the anesthesia team at [facility] health.

## 2024-02-23 NOTE — Progress Notes (Unsigned)
 78 y.o. [id number] female with PMB, uterine prolapse, mixed urinary incontinence here for TVUS. Widowed.  No LMP recorded. Patient is postmenopausal.   She reports spotting on and off for a year.  She had 1 day of heavy bleeding last month.  She is concerned that her bleeding is related to poorly controlled diabetes. Her fastings are between 170 and 200s.  Some bleeding since last appointment. No CP or SOB  Last mammogram: 08/05/23 BIRADS 2 Sexually active: no   GYN HISTORY: No significant history  OB History  Gravida Para Term Preterm AB Living  3 3 3   3   SAB IAB Ectopic Multiple Live Births      2    # Outcome Date GA Lbr Len/2nd Weight Sex Type Anes PTL Lv  3 Term      Vag-Spont   LIV  2 Term      Vag-Spont   LIV  1 Term      Vag-Spont       Past Medical History:  Diagnosis Date   Cataract    Diabetes mellitus without complication (HCC)    Dyspnea    on exertion   GERD (gastroesophageal reflux disease)    Hyperlipidemia    Hypertension    Multiple thyroid  nodules    Thyroid  disease     Past Surgical History:  Procedure Laterality Date   CHOLECYSTECTOMY     COSMETIC SURGERY Bilateral    eyelids   EYE SURGERY     GALLBLADDER SURGERY     MICROLARYNGOSCOPY WITH CO2 LASER AND EXCISION OF VOCAL CORD LESION N/A 04/02/2020   Procedure: MICROLARYNGOSCOPY WITH DILATION AND CO2 LASER AND EXCISION OF VOCAL CORD LESION w/MITOMYCIN  C;  Surgeon: Virgina Grills, MD;  Location: [facility] OR;  Service: ENT;  Laterality: N/A;   MICROLARYNGOSCOPY WITH DILATION N/A 05/23/2020   Procedure: MICROLARYNGOSCOPY W/ DILATION AND JET VENTILATION;  Surgeon: Virgina Grills, MD;  Location: [facility] OR;  Service: ENT;  Laterality: N/A;   MICROLARYNGOSCOPY WITH DILATION N/A 07/13/2020   Procedure: MICROLARYNGOSCOPY with Jet Ventilation;  Surgeon: Virgina Grills, MD;  Location: [facility] OR;  Service: ENT;  Laterality: N/A;   TRACHEOSTOMY TUBE PLACEMENT N/A 04/02/2020   Procedure: Awake TRACHEOSTOMY;  Surgeon: Virgina Grills, MD;  Location: [facility] OR;  Service: ENT;  Laterality: N/A;   TUBAL LIGATION      Current Outpatient Medications on File Prior to Visit  Medication Sig Dispense Refill   alendronate  (FOSAMAX ) 70 MG tablet Take 1 tablet (70 mg total) by mouth once a week. Take with a full glass of water on an empty stomach. 4 tablet 2   amLODipine  (NORVASC ) 5 MG tablet Take 1 tablet (5 mg total) by mouth daily. 90 tablet 0   Blood Glucose Monitoring Suppl (TRUE METRIX METER) DEVI 1 Device by Does not apply route 2 (two) times daily. 1 each 2   Cholecalciferol  50 MCG (2000 UT) TABS Take 1 tablet (2,000 Units total) by mouth daily. 90 tablet 1   Continuous Glucose Sensor (FREESTYLE LIBRE 3 PLUS SENSOR) MISC 1 each by Does not apply route every 14 (fourteen) days. 6 each 3   glucose blood (TRUE METRIX BLOOD GLUCOSE TEST) test strip 1 each by Other route 2 (two) times daily. USE AS INSTRUCTED TO CHECK BLOOD SUGAR ONE TIME DAILY 100 strip 4   insulin  glargine (LANTUS  SOLOSTAR) 100 UNIT/ML Solostar Pen Inject 24 Units into the skin daily. 30 mL 3   Insulin  Pen Needle 32G X 6 MM  MISC 1 Act by Does not apply route once a week. 100 each 1   Multiple Vitamin (MULTIVITAMIN WITH MINERALS) TABS tablet Take 1 tablet by mouth daily.     olmesartan  (BENICAR ) 20 MG tablet Take 1 tablet (20 mg total) by mouth daily. 90 tablet 0   omeprazole  (PRILOSEC) 40 MG capsule TAKE 1 CAPSULE BY MOUTH TWICE DAILY --  IN  THE  MORNING  AND  AT  BEDTIME (Patient taking differently: Take 40 mg by mouth daily. TAKE 1 CAPSULE BY MOUTH TWICE DAILY --  IN  THE  MORNING  AND  AT  BEDTIME) 180 capsule 0   potassium chloride  (KLOR-CON  10) 10 MEQ tablet Take 1 tablet (10 mEq total) by mouth 2 (two) times daily. 180 tablet 1   pravastatin  (PRAVACHOL ) 40 MG tablet Take 1 tablet (40 mg total) by mouth at bedtime. 90 tablet 1   Semaglutide (OZEMPIC, 2 MG/DOSE, [place]) Inject into the skin.     Dulaglutide  (TRULICITY ) 3 MG/0.5ML SOAJ Inject 3 mg into the skin  once a week. (Patient not taking: Reported on 02/23/2024) 6 mL 3   meclizine  (ANTIVERT ) 12.5 MG tablet Take 1 tablet (12.5 mg total) by mouth 3 (three) times daily as needed for dizziness. (Patient not taking: Reported on 02/23/2024) 30 tablet 1   methimazole  (TAPAZOLE ) 5 MG tablet Take 1 tablet (5 mg total) by mouth daily. (Patient not taking: Reported on 02/23/2024) 45 tablet 3   repaglinide  (PRANDIN ) 1 MG tablet Take 1 tablet (1 mg total) by mouth daily before supper. (Patient not taking: Reported on 02/23/2024) 90 tablet 3   No current facility-administered medications on file prior to visit.    Allergies  Allergen Reactions   Metformin  And Related Diarrhea      PE Today's Vitals   02/23/24 1027  BP: 124/60  Pulse: 86  Temp: 98 F (36.7 C)  TempSrc: Oral  SpO2: 99%  Weight: 187 lb (84.8 kg)   Body mass index is 32.35 kg/m.  Physical Exam Vitals reviewed.  Constitutional:      General: She is not in acute distress.    Appearance: Normal appearance.  HENT:     Head: Normocephalic and atraumatic.     Nose: Nose normal.  Eyes:     Extraocular Movements: Extraocular movements intact.     Conjunctiva/sclera: Conjunctivae normal.  Cardiovascular:     Rate and Rhythm: Normal rate and regular rhythm.     Heart sounds: No murmur heard.    No friction rub. No gallop.  Pulmonary:     Effort: Pulmonary effort is normal. No respiratory distress.     Breath sounds: Normal breath sounds. No stridor. No wheezing, rhonchi or rales.  Musculoskeletal:        General: Normal range of motion.     Cervical back: Normal range of motion.  Neurological:     General: No focal deficit present.     Mental Status: She is alert.  Psychiatric:        Mood and Affect: Mood normal.        Behavior: Behavior normal.     02/23/24 TVUS: Indications: Postmenopausal bleeding  Findings:   Uterus: 9.9 x 7.1 x 7.8 cm, anteverted uterus. Endometrial thickness: 57 mm.  Endometrium is fluid-filled with  multiple vascular masses noted. Left ovary: Not visualized Right ovary: Not visualized No free fluid.  Impression:  Thickened endometrium with multiple vascular masses noted.  Romaine Closs, MD  Assessment and Plan:  Endometrial mass -     Ambulatory Referral For Surgery Scheduling  PMB (postmenopausal bleeding) -     Ambulatory Referral For Surgery Scheduling  Reviewed TVUS with patient revealing thickened endometrium with multiple vascular masses.  Concerning for uterine precancer or malignancy.  Plan for diagnostic hysteroscopy, dilation and curettage, polypectomy.  Discussed outpatient procedure. Reviewed that  recovery is usually 1-2 days. Risks including infections, bleeding, and damage to surrounding organs reviewed. Recommend NPO prior to midnight and reviewed medication to take on day of surgery. Dicussed use of NSAIDS as needed for pain postoperatively.  Preop checklist: Antibiotics: none DVT ppx: SCDs Postop visit: 1 week Additional clearance: medical for Diabetes, HTN, CKD, GERD, anemia    Romaine Closs, MD

## 2024-02-25 ENCOUNTER — Encounter: Payer: Self-pay | Admitting: Obstetrics and Gynecology

## 2024-03-01 ENCOUNTER — Telehealth: Payer: Self-pay | Admitting: *Deleted

## 2024-03-01 NOTE — Progress Notes (Signed)
 Complex Care Management Care Guide Note  03/01/2024 Name: Bridget Butler MRN: [id number] DOB: 09-15-1946  Bridget Butler is a 78 y.o. year old female who is a primary care patient of Arcadio Knuckles, MD and is actively engaged with the care management team. I reached out to Bridget Butler by phone today to assist with re-scheduling  with the Pharmacist.  Follow up plan: Unsuccessful telephone outreach attempt made. A HIPAA compliant phone message was left for the patient providing contact information and requesting a return call. No further outreach attempts will be made due to inability to maintain patient contact. 1st attempt to reschedule made on 02/04/2024 by K Humble.   Kandis Ormond, CMA [place]  [facility], [facility] Guide Direct Dial: [phone]  Fax: [phone] Website: [email or web address]

## 2024-03-04 ENCOUNTER — Other Ambulatory Visit: Payer: Self-pay | Admitting: Internal Medicine

## 2024-03-10 ENCOUNTER — Telehealth: Payer: Self-pay

## 2024-03-10 ENCOUNTER — Other Ambulatory Visit: Payer: Self-pay | Admitting: Internal Medicine

## 2024-03-10 DIAGNOSIS — K219 Gastro-esophageal reflux disease without esophagitis: Secondary | ICD-10-CM

## 2024-03-10 NOTE — Telephone Encounter (Signed)
 Patient was identified as falling into the True [place] Measure - Diabetes.   Patient was: Appointment already scheduled for:  09/05/2024.

## 2024-03-15 ENCOUNTER — Telehealth: Payer: Self-pay | Admitting: Internal Medicine

## 2024-03-15 ENCOUNTER — Other Ambulatory Visit: Payer: Self-pay | Admitting: Internal Medicine

## 2024-03-15 DIAGNOSIS — K219 Gastro-esophageal reflux disease without esophagitis: Secondary | ICD-10-CM

## 2024-03-15 NOTE — Telephone Encounter (Signed)
 Copied from CRM [id number]. Topic: General - Other >> Mar 15, 2024  2:22 PM Bridget Butler wrote: Reason for CRM: Patient is calling to verify if office sent surgery clearance over to her OBGYN office to Dr. Cleone Dad at [facility] [phone]  [name] [phone]

## 2024-03-16 NOTE — Telephone Encounter (Signed)
 Surgical clearance is still in Dr. Rochelle Chu office waiting for him to complete

## 2024-03-23 ENCOUNTER — Telehealth: Payer: Self-pay

## 2024-03-23 NOTE — Telephone Encounter (Signed)
 Patient was identified as falling into the True [place] Measure - Diabetes.   Patient was: Appointment already scheduled for:  09/05/2024.

## 2024-03-25 ENCOUNTER — Other Ambulatory Visit (INDEPENDENT_AMBULATORY_CARE_PROVIDER_SITE_OTHER): Payer: Self-pay | Admitting: Pharmacist

## 2024-03-25 DIAGNOSIS — E1159 Type 2 diabetes mellitus with other circulatory complications: Secondary | ICD-10-CM

## 2024-03-25 DIAGNOSIS — E119 Type 2 diabetes mellitus without complications: Secondary | ICD-10-CM

## 2024-03-25 DIAGNOSIS — T502X5A Adverse effect of carbonic-anhydrase inhibitors, benzothiadiazides and other diuretics, initial encounter: Secondary | ICD-10-CM

## 2024-03-25 DIAGNOSIS — I152 Hypertension secondary to endocrine disorders: Secondary | ICD-10-CM

## 2024-03-25 MED ORDER — POTASSIUM CHLORIDE ER 10 MEQ PO TBCR: 10.0000 meq | EXTENDED_RELEASE_TABLET | Freq: Two times a day (BID) | ORAL | 1 refills | Status: DC

## 2024-03-25 NOTE — Progress Notes (Signed)
 03/25/24  Name: Bridget Butler MRN: [id number] DOB: [birth year]  Chief Complaint  Patient presents with   True [place] Metric Diabetes Follow Up    Bridget Butler is a 78 y.o. year old female who presented for a telephone visit.   They were referred to the pharmacist by their PCP for assistance in managing diabetes and CKD.    Subjective:  Care Team: Primary Care Provider: Arcadio Knuckles, MD ; Next Scheduled Visit: 09/05/24 Endocrinologist Dr. Ned Butler; Next Scheduled Visit: 04/27/24  Medication Access/Adherence  Current Pharmacy:  [facility] [address] ([address]), [place] - [address] [place] ([place]) [place] [address] Phone: [phone] Fax: [phone]    Patient reports affordability concerns with their medications: Yes  Patient reports access/transportation concerns to their pharmacy: No  Patient reports adherence concerns with their medications:  No    Pt notes Lantus  is expensive - was $75 on last fill Pt requests Potassium chloride  refill  Diabetes:  Current medications: Lantus  24 units daily, Ozempic 0.5 mg weekly (pt notes she self-increased to 0.75 mg by giving herself a 0.5 mg and 0.25 mg injection weekly last week)  Medications tried in the past: metformin  XR (stopped recently due to GI upset), farxiga   Started Ozempic around end of March Denies nausea or other intolerable side effects with Ozempic  Current glucose readings:  Has been using Freestyle Libre CGM    Current meal patterns: She notes she has been working on increasing protein and decreasing carbs - Breakfast: whole grain cereal with almond milk, banana - Lunch: chicken sandwich - Supper: meat, vegetable, starch - Snacks: Pringles or popcorn - Drinks: 1 liter of soda per week  Current physical activity: walks dog every morning, does exercises at home before bed. Used to do water aerobics but not currently  Current medication access support: Ozempic through  PAP  Objective:  Lab Results  Component Value Date   HGBA1C 8.7 (A) 12/24/2023    Lab Results  Component Value Date   CREATININE 1.05 01/04/2024   BUN 12 01/04/2024   NA 136 01/04/2024   K 4.1 01/04/2024   CL 101 01/04/2024   CO2 28 01/04/2024    Lab Results  Component Value Date   CHOL 207 (H) 07/28/2023   HDL 72.30 07/28/2023   LDLCALC 99 07/28/2023   TRIG 178.0 (H) 07/28/2023   CHOLHDL 3 07/28/2023    Medications Reviewed Today     Reviewed by Bridget Butler, RPH (Pharmacist) on 03/25/24 at 1003  Med List Status: <None>   Medication Order Taking? Sig Documenting Provider Last Dose Status Informant  alendronate  (FOSAMAX ) 70 MG tablet [id number]  Take 1 tablet (70 mg total) by mouth once a week. Take with a full glass of water on an empty stomach. Bridget Knuckles, MD  Active   amLODipine  (NORVASC ) 5 MG tablet [id number]  Take 1 tablet (5 mg total) by mouth daily. Bridget Knuckles, MD  Active   Blood Glucose Monitoring Suppl (TRUE Donnald Fuss) DEVI [id number]  1 Device by Does not apply route 2 (two) times daily. Bridget Knuckles, MD  Active   Cholecalciferol  50 MCG (2000 UT) TABS [id number]  Take 1 tablet (2,000 Units total) by mouth daily. Bridget Knuckles, MD  Active   Continuous Glucose Sensor (FREESTYLE LIBRE 3 PLUS SENSOR) [place] [id number]  1 each by Does not apply route every 14 (fourteen) days. Bridget Harden, MD  Active   glucose blood (TRUE  METRIX BLOOD GLUCOSE TEST) test strip [id number]  1 each by Other route 2 (two) times daily. USE AS INSTRUCTED TO CHECK BLOOD SUGAR ONE TIME DAILY Bridget Knuckles, MD  Active   insulin  glargine (LANTUS  SOLOSTAR) 100 UNIT/ML Solostar Pen [id number] Yes Inject 24 Units into the skin daily. Bridget Harden, MD Taking Active   Insulin  Pen Needle 32G X 6 MM MISC [id number]  1 Act by Does not apply route once a week. Bridget Knuckles, MD  Active   Multiple Vitamin (MULTIVITAMIN WITH MINERALS) TABS tablet [id number]  Take 1 tablet by mouth  daily. [provider]  Active Self  olmesartan  (BENICAR ) 20 MG tablet [id number]  Take 1 tablet (20 mg total) by mouth daily. Bridget Coombe, MD  Active   omeprazole  (PRILOSEC) 40 MG capsule [id number]  TAKE 1 CAPSULE BY MOUTH TWICE DAILY IN THE MORNING AND AT BEDTIME Bridget Knuckles, MD  Active   potassium chloride  (KLOR-CON  10) 10 MEQ tablet [id number]  Take 1 tablet (10 mEq total) by mouth 2 (two) times daily. Bridget Knuckles, MD  Active   pravastatin  (PRAVACHOL ) 40 MG tablet [id number]  Take 1 tablet (40 mg total) by mouth at bedtime. Bridget Knuckles, MD  Active   Semaglutide [facility], 2 MG/DOSE, [place]) [id number] Yes Inject 0.5 mg into the skin once a week. Getting through PAP [provider] Taking Active            Med Note Bridget Butler, Bridget Butler   Fri Mar 25, 2024  9:58 AM) Pt reports giving herself 0.5 mg and 0.25 mg to equal 0.75 mg (self-increased)              Assessment/Plan:   Diabetes: - Currently uncontrolled, A1c goal <8% - Reviewed goal fasting, and goal 2 hour post prandial glucose - Discussed that she will run out of her Ozempic PAP supply early since she is giving herself more than prescribed. Will need to see if Dr Bridget Butler would agree to send PAP change form to increase Ozempic to 1 mg - Insulin  is $25 per 30 DS on her plan. Last fill was for 120 DS so it was $75. Since she has Ozempic PAP approved, could request Tresiba through PAP.  - Bridget Butler approved Ozempic increase to 1 mg and changing Lantus  to Horace Lye - Bridget Butler informed me she will be faxing this off to [facility] PAP. Pt notified. -Endo appt scheduled for 7/9  Follow Up Plan: 6/30  Bridget Butler, PharmD, BCPS, CPP Clinical Pharmacist Practitioner [facility] at [facility] [phone]

## 2024-03-28 NOTE — Patient Instructions (Signed)
 It was a pleasure speaking with you today!  Increase Ozempic to 1 mg weekly once you receive it from the patient assistance program.   Lantus  will be changed to Tresiba 24 units daily so we can also get it through the assistance program at no cost.  Feel free to call with any questions or concerns!  [name], PharmD, BCPS, CPP Clinical Pharmacist Practitioner [place] at [facility] [phone]

## 2024-04-05 DIAGNOSIS — E113553 Type 2 diabetes mellitus with stable proliferative diabetic retinopathy, bilateral: Secondary | ICD-10-CM | POA: Diagnosis not present

## 2024-04-05 DIAGNOSIS — H5203 Hypermetropia, bilateral: Secondary | ICD-10-CM | POA: Diagnosis not present

## 2024-04-05 DIAGNOSIS — H52223 Regular astigmatism, bilateral: Secondary | ICD-10-CM | POA: Diagnosis not present

## 2024-04-14 ENCOUNTER — Telehealth: Payer: Self-pay | Admitting: Internal Medicine

## 2024-04-14 DIAGNOSIS — E1159 Type 2 diabetes mellitus with other circulatory complications: Secondary | ICD-10-CM

## 2024-04-14 DIAGNOSIS — N1831 Chronic kidney disease, stage 3a: Secondary | ICD-10-CM

## 2024-04-14 NOTE — Telephone Encounter (Signed)
 Copied from CRM [id number]. Topic: Clinical - Medication Refill >> Apr 14, 2024 11:48 AM [name] wrote: Medication: olmesartan  (BENICAR ) 20 MG tablet  Has the patient contacted their pharmacy? Yes, pt was told there are no refills for her.  This is the patient's preferred pharmacy:  [facility] [address] ([address] [place] - [address] [place] (SE) [place] [address] Phone: [phone] Fax: [phone]   Is this the correct pharmacy for this prescription? Yes If no, delete pharmacy and type the correct one.   Has the prescription been filled recently? Yes March   Is the patient out of the medication? Yes  Has the patient been seen for an appointment in the last year OR does the patient have an upcoming appointment? Yes  Can we respond through MyChart? Yes  Agent: Please be advised that Rx refills may take up to 3 business days. We ask that you follow-up with your pharmacy.

## 2024-04-15 MED ORDER — OLMESARTAN MEDOXOMIL 20 MG PO TABS: 20.0000 mg | ORAL_TABLET | Freq: Every day | ORAL | 0 refills | Status: DC

## 2024-04-18 ENCOUNTER — Other Ambulatory Visit (INDEPENDENT_AMBULATORY_CARE_PROVIDER_SITE_OTHER): Admitting: Pharmacist

## 2024-04-18 DIAGNOSIS — Z794 Long term (current) use of insulin: Secondary | ICD-10-CM

## 2024-04-18 DIAGNOSIS — E119 Type 2 diabetes mellitus without complications: Secondary | ICD-10-CM

## 2024-04-18 NOTE — Progress Notes (Unsigned)
   04/18/24  Name: Bridget Butler MRN: [id number] DOB: 1946-05-31  Chief Complaint  Patient presents with   Diabetes   Medication Management    Bridget Butler is a 78 y.o. year old female who presented for a telephone visit.   They were referred to the pharmacist by their PCP for assistance in managing diabetes and CKD.   Subjective:  Care Team: Primary Care Provider: Joshua Debby CROME, MD ; Next Scheduled Visit: 09/05/24 Endocrinologist Dr. Vianne; Next Scheduled Visit: 04/27/24  Medication Access/Adherence  Current Pharmacy:  [facility] [address] ([address]), [place] - [address] [name] ([place]) [place] [address] Phone: [phone] Fax: [phone]    Patient reports affordability concerns with their medications: Yes  Patient reports access/transportation concerns to their pharmacy: No  Patient reports adherence concerns with their medications:  No     Diabetes:  Current medications: Lantus  20 units daily, Ozempic 1 mg weekly (pt has been taking 2 of the Ozempic 0.5 mg doses - has 1 dose left) Medications tried in the past: metformin  XR (stopped recently due to GI upset), farxiga   Started Ozempic around end of March, increased to 1 mg early June Denies nausea or other intolerable side effects with Ozempic  Current glucose readings:  Has been using Freestyle Libre CGM    Current meal patterns: She notes she has been working on increasing protein and decreasing carbs - Breakfast: whole grain cereal with almond milk, banana - Lunch: chicken sandwich - Supper: meat, vegetable, starch - Snacks: Pringles or popcorn - Drinks: 1 liter of soda per week  Current physical activity: walks dog every morning, does exercises at home before bed. Used to do water aerobics but not currently  Current medication access support: Ozempic through PAP, will also be getting tresiba through PAP  Objective:  Lab Results  Component Value Date   HGBA1C 8.7 (A)  12/24/2023    Lab Results  Component Value Date   CREATININE 1.05 01/04/2024   BUN 12 01/04/2024   NA 136 01/04/2024   K 4.1 01/04/2024   CL 101 01/04/2024   CO2 28 01/04/2024    Lab Results  Component Value Date   CHOL 207 (H) 07/28/2023   HDL 72.30 07/28/2023   LDLCALC 99 07/28/2023   TRIG 178.0 (H) 07/28/2023   CHOLHDL 3 07/28/2023    Medications Reviewed Today   Medications were not reviewed in this encounter       Assessment/Plan:   Diabetes: - Currently controlled per [facility] data, A1c goal <8%. Avg BG and time in target has improved since increasing Ozempic. Average was previously 194 early June, currently 166 and TIR improved from 45% to 67% - Reviewed goal fasting, and goal 2 hour post prandial glucose - Dr. Trixie approved Ozempic increase to 1 mg and changing Lantus  to [place] - [facility] did fax that Ozempic 1 mg and Tresiba were approved - waiting for shipment -Endo appt scheduled for 7/9  Follow Up Plan: 7/14  Darrelyn Drum, PharmD, BCPS, CPP Clinical Pharmacist Practitioner [facility] at [facility] [phone]

## 2024-04-21 NOTE — Progress Notes (Signed)
 Spoke with patient and advised her that she would need an OV with Dr Joshua for him to sign this form. She stated that she recently seen Dr. Norleen and I advised her that we could ask him if he would sign the clearance but its not guaranteed. She gave a verbal understanding.

## 2024-04-27 ENCOUNTER — Encounter: Payer: Self-pay | Admitting: Internal Medicine

## 2024-04-27 ENCOUNTER — Ambulatory Visit: Admitting: Internal Medicine

## 2024-04-27 VITALS — BP 124/60 | HR 79 | Ht 63.75 in | Wt 183.6 lb

## 2024-04-27 DIAGNOSIS — E1122 Type 2 diabetes mellitus with diabetic chronic kidney disease: Secondary | ICD-10-CM | POA: Diagnosis not present

## 2024-04-27 DIAGNOSIS — N1831 Chronic kidney disease, stage 3a: Secondary | ICD-10-CM | POA: Diagnosis not present

## 2024-04-27 DIAGNOSIS — Z7985 Long-term (current) use of injectable non-insulin antidiabetic drugs: Secondary | ICD-10-CM | POA: Diagnosis not present

## 2024-04-27 DIAGNOSIS — E042 Nontoxic multinodular goiter: Secondary | ICD-10-CM

## 2024-04-27 DIAGNOSIS — Z794 Long term (current) use of insulin: Secondary | ICD-10-CM

## 2024-04-27 DIAGNOSIS — E0590 Thyrotoxicosis, unspecified without thyrotoxic crisis or storm: Secondary | ICD-10-CM | POA: Diagnosis not present

## 2024-04-27 LAB — POCT GLYCOSYLATED HEMOGLOBIN (HGB A1C): Hemoglobin A1C: 7.9 % — AB (ref 4.0–5.6)

## 2024-04-27 MED ORDER — OZEMPIC (0.25 OR 0.5 MG/DOSE) 2 MG/3ML ~~LOC~~ SOPN: PEN_INJECTOR | SUBCUTANEOUS | Status: DC

## 2024-04-27 NOTE — Patient Instructions (Addendum)
 Please continue: - Ozempic  1 mg weekly  Change from Lantus  to: - Tresiba 20 units daily   You can continue to stay off methimazole .    Please return in 3-4 months.

## 2024-04-27 NOTE — Progress Notes (Signed)
 Patient ID: Bridget Butler, female   DOB: 01/04/1946, 78 y.o.   MRN: [id number]   HPI  Bridget Butler is a 78 y.o.-year-old female, initially referred by her PCP, Dr. Loreli, returning for follow-up for toxic thyroid  adenomas with subclinical thyrotoxicosis and now also uncontrolled DM2, insulin -dependent, with complications (CKD, DR, PN).  Last visit 4 months ago.  Interim history: No increased urination, nausea, chest pain.  Since last visit, she was able to start the CGM and she likes it.  She adjusts her diet based on the readings.  Toxic thyroid  nodules:  Reviewed history: She was diagnosed with hyperthyroidism approximately 2010. She was seeing Dr. Bonnie Gaskins - he retired. She was started on medication (cannot remember name) >> felt off >> stopped in ~2010. I recommended to add methimazole  2.5 mg in 08/2022 but taking it inconsistently. In 01/2023, I advised her to take 5 mg every other day consistently. She took 2.5 mg daily as she had them already cut, but ran out sometime ago, cannot remember how long... Since then, we restarted methimazole , latest recommendation being to increase the dose to 5 mg daily (04/2023). At the visit with PCP in 07/2023, she reported not taking the methimazole  and she was not aware of her thyroid  diagnosis...  Reviewed her TFTs: Lab Results  Component Value Date   TSH 0.36 01/04/2024   TSH 0.44 12/24/2023   TSH 0.36 (L) 07/28/2023   TSH 0.20 (L) 05/04/2023   TSH 0.31 (L) 02/12/2023   TSH 0.23 (L) 12/30/2022   TSH 0.20 (L) 09/01/2022   TSH 0.36 12/31/2021   TSH 0.38 01/14/2021   TSH 0.57 07/18/2020   FREET4 0.81 01/04/2024   FREET4 1.0 12/24/2023   FREET4 0.72 05/04/2023   FREET4 0.72 02/12/2023   FREET4 0.90 09/01/2022   FREET4 0.90 01/14/2021   FREET4 0.66 07/18/2020   FREET4 0.93 06/08/2020   FREET4 0.84 04/11/2019   FREET4 0.97 10/29/2018   T3FREE 3.4 01/04/2024   T3FREE 3.4 12/24/2023   T3FREE 3.5 05/04/2023   T3FREE 3.0 02/12/2023    T3FREE 3.3 09/01/2022   T3FREE 3.0 01/14/2021   T3FREE 3.1 07/18/2020   T3FREE 3.0 04/11/2019   T3FREE 3.8 10/29/2018   T3FREE 3.3 07/08/2018   Her TSI antibodies were not elevated: Lab Results  Component Value Date   TSI <89 10/29/2018    Thyroid  uptake (08/28/2007):  There is heterogeneous uptake within a normal sized gland. There are regions of photopenia within the bilateral upper poles and in the left lower pole. The most intense uptake is in the mid pole of the right lobe of the thyroid  gland. Decreased uptake in the inferior pole of the right lobe of the thyroid  gland additionally.   Calculated 24-hour I-131 uptake is equal to 25.0%, which is normal (normal 10-30%).   IMPRESSION:   1. Heterogeneous uptake within the thyroid  gland could represent resolving subacute thyroiditis in patient with a normal uptake.  2. Cannot exclude a multinodular goiter if TSH continues to remain depressed  We obtained a thyroid  uptake and scan (11/30/2018): Normal uptake but multinodular goiter with warm nodules at inferior thyroid  poles:  4 hour I-123 uptake = 8.6% (normal 5-20%) 24 hour I-123 uptake = 25.3% (normal 10-30%)   IMPRESSION: Normal 4 hour and 24 hour radio iodine uptakes.   Multinodular thyroid  gland with warm nodules at the inferior poles of both thyroid  lobes, corresponding to dominant masses identified on a remote thyroid  ultrasound consistent with adenomas.  No FH  of thyroid  disease or thyroid  cancer. No h/o radiation tx to head or neck. No herbal supplements. No Biotin use.  No steroids.  Multinodular goiter per ultrasound from 06/28/2013.   Biopsies were benign on 07/13/2013.  Pt denies: - feeling nodules in neck - hoarseness - dysphagia - choking  DM2: -Diagnosed: 9092 - insulin  started: ~2006  Reviewed history: Her diabetes became uncontrolled after her first laryngeal surgery in 03/2020.  She was on steroids at the time of her surgery.  Reviewed HbA1c  levels: Lab Results  Component Value Date   HGBA1C 8.7 (A) 12/24/2023   HGBA1C 9.6 (H) 07/28/2023   HGBA1C 8.2 (H) 12/30/2022   HGBA1C 7.7 (A) 09/01/2022   HGBA1C 7.2 (A) 05/01/2022   HGBA1C 7.7 (H) 12/31/2021   HGBA1C 7.5 (A) 09/09/2021   HGBA1C 7.7 (H) 05/01/2021   HGBA1C 7.5 (A) 01/14/2021   HGBA1C 7.7 (A) 10/15/2020   She is on: - Ozempic  0.5 mg weekly >> off >> 0.25 >> 0.5 >> 1 mg weekly - Basaglar  16 >> ...18 >> 20 >> 24 units in a.m. >> Lantus  20 units in am -per insurance preference She had GI intolerance with regular Metformin .  She was then on metformin  ER 500 mg - but stopped 05/2023 due to abdominal pain and diarrhea. She was also on Prandin  1 to 2 mg before dinner, added 08/2022, but she was not taking this at our visit from 07/2023. Tried Farxiga  and Jardiance  >> $$$. She was previously on Janumet . She was previously on Trulicity  through patient assistance.  She was previously checking sugars once a day but since last visit she was able to start, CGM:  Previously: - am:  102-161 >> 140-200 >> 105-193, 203, 245 (steak) - 2h after b'fast: n/c - before lunch: n/c >> 154 >> n/c - 2h after lunch: 154, 159 >> 83-216, 229 >> n/c >> 154 >> n/c - before dinner: n/c >> 211 >> n/c - 2h after dinner: n/c - bedtime:  133-229, 237 >> n/c >> 128-282, 305 >> n/c - nighttime: n/c>> 79, 102 Lowest sugar was 63 >> .[name]..  102 >> 140 >> 79 >> 60; she has hypoglycemia awareness at 70.  Highest sugar was 400s >> .[name].305 >> ... 200s >> 245 >> 282.  Glucometer: True Metrix  Pt's meals are: - Breakfast: eggs, [place] sausage or oatmeal >> coffee  - Lunch: salad + eggs + [place] meat (no pork) >> sandwich - Dinner: veggies + chicken/turkey and fish - Snacks: not after sx  -+ Mild CKD, last BUN/creatinine:  Lab Results  Component Value Date   BUN 12 01/04/2024   BUN 13 07/28/2023   CREATININE 1.05 01/04/2024   CREATININE 1.14 07/28/2023   Lab Results  Component Value Date    MICRALBCREAT 7 06/03/2019   MICRALBCREAT 14.1 03/29/2018  On losartan  50.  Kerendia  was suggested by PCP in 07/2023, but she mentions that this was too expensive and did not start.  -+ HL; last set of lipids: Lab Results  Component Value Date   CHOL 207 (H) 07/28/2023   HDL 72.30 07/28/2023   LDLCALC 99 07/28/2023   TRIG 178.0 (H) 07/28/2023   CHOLHDL 3 07/28/2023  On pravastatin  40.  - last eye exam was on 06/24/2023: + DR, stable reportedly. Sees retina specialist. Dr. [name] Slocumb.  -+ Improved numbness and tingling in her feet.  Last foot exam 12/30/2022.  On ASA 81  Pt has FH of DM in mother.  She also has a  history of GERD, osteopenia, microlaryngoscopy for subglottic stenosis 06 and 06/2020.  ROS:  + see HPI  I reviewed pt's medications, allergies, PMH, social hx, family hx, and changes were documented in the history of present illness. Otherwise, unchanged from my initial visit note.  Past Medical History:  Diagnosis Date   Cataract    Diabetes mellitus without complication (HCC)    Dyspnea    on exertion   GERD (gastroesophageal reflux disease)    Hyperlipidemia    Hypertension    Multiple thyroid  nodules    Thyroid  disease    Past Surgical History:  Procedure Laterality Date   CHOLECYSTECTOMY     COSMETIC SURGERY Bilateral    eyelids   EYE SURGERY     GALLBLADDER SURGERY     MICROLARYNGOSCOPY WITH CO2 LASER AND EXCISION OF VOCAL CORD LESION N/A 04/02/2020   Procedure: MICROLARYNGOSCOPY WITH DILATION AND CO2 LASER AND EXCISION OF VOCAL CORD LESION w/MITOMYCIN  C;  Surgeon: Carlie Clark, MD;  Location: [facility] OR;  Service: ENT;  Laterality: N/A;   MICROLARYNGOSCOPY WITH DILATION N/A 05/23/2020   Procedure: MICROLARYNGOSCOPY W/ DILATION AND JET VENTILATION;  Surgeon: Carlie Clark, MD;  Location: [facility] OR;  Service: ENT;  Laterality: N/A;   MICROLARYNGOSCOPY WITH DILATION N/A 07/13/2020   Procedure: MICROLARYNGOSCOPY with Jet Ventilation;  Surgeon: Carlie Clark, MD;   Location: [facility] OR;  Service: ENT;  Laterality: N/A;   TRACHEOSTOMY TUBE PLACEMENT N/A 04/02/2020   Procedure: Awake TRACHEOSTOMY;  Surgeon: Carlie Clark, MD;  Location: [facility] OR;  Service: ENT;  Laterality: N/A;   TUBAL LIGATION     Social History   Socioeconomic History   Marital status: Widowed    Spouse name: Not on file   Number of children: 3   Years of education: 92   Highest education level: Not on file  Occupational History   Occupation: Retired  Tobacco Use   Smoking status: Never   Smokeless tobacco: Never  Vaping Use   Vaping status: Never Used  Substance and Sexual Activity   Alcohol use: No   Drug use: No   Sexual activity: Not Currently    Partners: Male  Other Topics Concern   Not on file  Social History Narrative   Mrs Rather is a 78 year old Jehovah witness patient    She receives support from her daughter primarily who also assists with transportation to medical appointments.    She is presently independent with her ADLs and iADLs      Lives with son and daughter      Social Drivers of Health   Financial Resource Strain: Low Risk  (09/04/2023)   Overall Financial Resource Strain (CARDIA)    Difficulty of Paying Living Expenses: Not hard at all  Food Insecurity: No Food Insecurity (09/04/2023)   Hunger Vital Sign    Worried About Running Out of Food in the Last Year: Never true    Ran Out of Food in the Last Year: Never true  Transportation Needs: No Transportation Needs (09/04/2023)   PRAPARE - Administrator, Civil Service (Medical): No    Lack of Transportation (Non-Medical): No  Physical Activity: Insufficiently Active (09/04/2023)   Exercise Vital Sign    Days of Exercise per Week: 5 days    Minutes of Exercise per Session: 20 min  Stress: No Stress Concern Present (09/04/2023)   [employer] of Occupational Health - Occupational Stress Questionnaire    Feeling of Stress : Not at all  Social  Connections: Moderately Integrated  (09/04/2023)   Social Connection and Isolation Panel    Frequency of Communication with Friends and Family: More than three times a week    Frequency of Social Gatherings with Friends and Family: More than three times a week    Attends Religious Services: More than 4 times per year    Active Member of [employer] or Organizations: Yes    Attends Banker Meetings: 1 to 4 times per year    Marital Status: Widowed  Intimate Partner Violence: Patient Unable To Answer (09/04/2023)   Humiliation, Afraid, Rape, and Kick questionnaire    Fear of Current or Ex-Partner: Patient unable to answer    Emotionally Abused: Patient unable to answer    Physically Abused: Patient unable to answer    Sexually Abused: Patient unable to answer   Current Outpatient Medications on File Prior to Visit  Medication Sig Dispense Refill   alendronate  (FOSAMAX ) 70 MG tablet Take 1 tablet (70 mg total) by mouth once a week. Take with a full glass of water on an empty stomach. 4 tablet 2   amLODipine  (NORVASC ) 5 MG tablet Take 1 tablet (5 mg total) by mouth daily. 90 tablet 0   Blood Glucose Monitoring Suppl (TRUE METRIX METER) DEVI 1 Device by Does not apply route 2 (two) times daily. 1 each 2   Cholecalciferol  50 MCG (2000 UT) TABS Take 1 tablet (2,000 Units total) by mouth daily. 90 tablet 1   Continuous Glucose Sensor (FREESTYLE LIBRE 3 PLUS SENSOR) MISC 1 each by Does not apply route every 14 (fourteen) days. 6 each 3   glucose blood (TRUE METRIX BLOOD GLUCOSE TEST) test strip 1 each by Other route 2 (two) times daily. USE AS INSTRUCTED TO CHECK BLOOD SUGAR ONE TIME DAILY 100 strip 4   insulin  glargine (LANTUS  SOLOSTAR) 100 UNIT/ML Solostar Pen Inject 24 Units into the skin daily. (Patient taking differently: Inject 20 Units into the skin daily.) 30 mL 3   Insulin  Pen Needle 32G X 6 MM MISC 1 Act by Does not apply route once a week. 100 each 1   Multiple Vitamin (MULTIVITAMIN WITH MINERALS) TABS tablet Take 1  tablet by mouth daily.     olmesartan  (BENICAR ) 20 MG tablet Take 1 tablet (20 mg total) by mouth daily. 90 tablet 0   omeprazole  (PRILOSEC) 40 MG capsule TAKE 1 CAPSULE BY MOUTH TWICE DAILY IN THE MORNING AND AT BEDTIME 180 capsule 0   potassium chloride  (KLOR-CON  10) 10 MEQ tablet Take 1 tablet (10 mEq total) by mouth 2 (two) times daily. 180 tablet 1   pravastatin  (PRAVACHOL ) 40 MG tablet Take 1 tablet (40 mg total) by mouth at bedtime. 90 tablet 1   Semaglutide  (OZEMPIC , 2 MG/DOSE, [place]) Inject 0.5 mg into the skin once a week. Getting through PAP (Patient taking differently: Inject 1 mg into the skin once a week. Getting through PAP)     No current facility-administered medications on file prior to visit.   Allergies  Allergen Reactions   Metformin  And Related Diarrhea   Family History  Problem Relation Age of Onset   Heart disease Mother    Hypertension Mother    Cancer Sister    Diabetes Sister    PE: BP 124/60   Pulse 79   Ht 5' 3.75 (1.619 m)   Wt 183 lb 9.6 oz (83.3 kg)   SpO2 97%   BMI 31.76 kg/m   Wt Readings from Last 10 Encounters:  04/27/24 183 lb 9.6 oz (83.3 kg)  02/23/24 187 lb (84.8 kg)  02/03/24 189 lb (85.7 kg)  01/04/24 190 lb (86.2 kg)  12/24/23 195 lb 6.4 oz (88.6 kg)  09/04/23 187 lb (84.8 kg)  08/20/23 187 lb 6.4 oz (85 kg)  07/28/23 188 lb (85.3 kg)  07/01/23 191 lb (86.6 kg)  05/04/23 190 lb (86.2 kg)   Constitutional: overweight, in NAD Eyes: EOMI, no exophthalmos ENT: no thyromegaly, no thyroid  masses palpated, no cervical lymphadenopathy Cardiovascular: Tachycardia, RR, No MRG, +B LE edema Respiratory: CTA B Musculoskeletal: no deformities Skin: no rashes Neurological: no tremor with outstretched hands Diabetic Foot Exam - Simple   Simple Foot Form Diabetic Foot exam was performed with the following findings: Yes 04/27/2024 12:01 PM  Visual Inspection No deformities, no ulcerations, no other skin breakdown bilaterally: Yes Sensation  Testing Intact to touch and monofilament testing bilaterally: Yes Pulse Check Posterior Tibialis and Dorsalis pulse intact bilaterally: Yes Comments + B pitting foot edema    ASSESSMENT: 1. DM2-insulin  dependent, uncontrolled, with complications - CKD - DR - PN  2. Thyrotoxic adenomas  3. Multiple thyroid  nodules  PLAN:  1. DM2 -Patient with longstanding, uncontrolled, type 2 diabetes, on long-acting insulin  and weekly GLP-1 receptor agonist, with still poor control.  At last visit, she just obtained a CGM and we started at last visit.  At that time, she does started Ozempic  0.25 mg weekly and she had 2 doses, tolerated well.  I advised her to increase the dose to 0.5 mg weekly.  HbA1c was lower, but still above target, at 8.7%. CGM interpretation: -At today's visit, we reviewed her CGM downloads: It appears that 68% of values are in target range (goal >70%), while 32% are higher than 180 (goal <25%), and 0% are lower than 70 (goal <4%).  The calculated average blood sugar is 166.  The projected HbA1c for the next 3 months (GMI) is 7.3%. -Reviewing the CGM trends, sugars appear to have improved gradually in the last 2 months, and at today's visit, they are mostly fluctuating within the target range with a hyperglycemic spike after her first meal of the day, and occasional peaks after her snack in the afternoon and more consistent hyperglycemia after dinner.  She does mention that she is now adjusting her meals based on the readings from the CGM, which is excellent.  With the significant improvement in her blood sugars in the last few months, I did not suggest intensify her regimen for now, but it recommend to continue to work on her meals. -She does mention that Lantus  is still expensive and since she gets Ozempic  from the patient assistance program from [employer], will change to [place] so she can get it from the same place. - I advised the pt to: Patient Instructions  Please continue: -  Ozempic  1 mg weekly  Change from Lantus  to: - Tresiba 20 units daily   You can continue to stay off methimazole .    Please return in 3-4 months.  - we checked her HbA1c: 7.9% (lower) - advised to check sugars at different times of the day - 4x a day, rotating check times - advised for yearly eye exams >> she is UTD - return to clinic in 3-4 months  2.  Patient with a history of low TSH without thyrotoxic signs or symptoms: She denies weight loss, heat intolerance, hyperdefecation, palpitations, anxiety -Her thyroid  uptake and scan showed toxic adenomas in the inferior thyroid  lobes - Had a  mildly suppressed TSH with normal free thyroid  hormones.  She previously had a very low TSH at 0.03 and we discussed about RAI treatment, however, TFTs improved afterwards.  In 2023, TSH was still suppressed, I suggested to add a low-dose methimazole , 2.5 mg daily.  She was taking the medication inconsistently in the past and she was off at last visit, but we did not have to restart this - At last visit, TSH was normal: Lab Results  Component Value Date   TSH 0.36 01/04/2024  - Will continue to keep a close eye on her TFTs - No thyrotoxic signs or symptoms but she is usually tachycardic at the time of the visit.  3. Multiple thyroid  nodules - She denies neck compression symptoms and has no masses on neck palpation today -2 of the nodules were biopsied in 2014 with benign results -She had a thyroid  uptake and scan in 2020 which showed 4 nodules in the lower thyroid  pole, but without indication for biopsy - We are following her conservatively  Lela Fendt, MD PhD [facility] Endocrinology

## 2024-04-28 LAB — MICROALBUMIN / CREATININE URINE RATIO
Creatinine, Urine: 85 mg/dL (ref 20–275)
Microalb Creat Ratio: 351 mg/g{creat} — ABNORMAL HIGH (ref <30)
Microalb, Ur: 29.8 mg/dL

## 2024-05-02 ENCOUNTER — Other Ambulatory Visit (INDEPENDENT_AMBULATORY_CARE_PROVIDER_SITE_OTHER): Admitting: Pharmacist

## 2024-05-02 DIAGNOSIS — Z794 Long term (current) use of insulin: Secondary | ICD-10-CM

## 2024-05-02 DIAGNOSIS — E119 Type 2 diabetes mellitus without complications: Secondary | ICD-10-CM

## 2024-05-02 NOTE — Progress Notes (Signed)
   05/02/24  Name: Bridget Butler MRN: [id number] DOB: 1945/12/27  Chief Complaint  Patient presents with   Diabetes   Medication Management    Bridget Butler is a 78 y.o. year old female who presented for a telephone visit.   They were referred to the pharmacist by their PCP for assistance in managing diabetes and CKD.    Subjective:  Care Team: Primary Care Provider: Joshua Debby CROME, MD ; Next Scheduled Visit: 09/05/24 Endocrinologist Dr. Vianne; Next Scheduled Visit: 08/30/24  Medication Access/Adherence  Current Pharmacy:  [facility] [address] ([address]), [place] - [address] [name] ([place]) [place] [address] Phone: [phone] Fax: [phone]    Patient reports affordability concerns with their medications: Yes  Patient reports access/transportation concerns to their pharmacy: No  Patient reports adherence concerns with their medications:  No     Diabetes:  Current medications: Lantus  20 units daily, Ozempic  1 mg weekly (pt has been taking 2 of the Ozempic  0.5 mg doses - has 1 dose left) Medications tried in the past: metformin  XR (stopped recently due to GI upset), farxiga   Started Ozempic  around end of March, increased to 1 mg early June Denies nausea or other intolerable side effects with Ozempic   Current glucose readings:  Has been using Freestyle Libre CGM    Current meal patterns: She notes she has been working on increasing protein and decreasing carbs - Breakfast: whole grain cereal with almond milk, banana - Lunch: chicken sandwich - Supper: meat, vegetable, starch - Snacks: Pringles or popcorn - Drinks: 1 liter of soda per week  Current physical activity: walks dog every morning, does exercises at home before bed. Used to do water aerobics but not currently  Current medication access support: Ozempic  through PAP, will also be getting tresiba through PAP - still waiting on shipment  Objective:  Lab Results  Component  Value Date   HGBA1C 8.7 (A) 12/24/2023    Lab Results  Component Value Date   CREATININE 1.05 01/04/2024   BUN 12 01/04/2024   NA 136 01/04/2024   K 4.1 01/04/2024   CL 101 01/04/2024   CO2 28 01/04/2024    Lab Results  Component Value Date   CHOL 207 (H) 07/28/2023   HDL 72.30 07/28/2023   LDLCALC 99 07/28/2023   TRIG 178.0 (H) 07/28/2023   CHOLHDL 3 07/28/2023    Medications Reviewed Today   Medications were not reviewed in this encounter     Assessment/Plan:   Diabetes: - Currently controlled per [employer] data, A1c goal <8%. Avg BG and time in target has improved since increasing Ozempic . Average was previously 194 early June, currently 155 and TIR improved from 67% to 76% - Reviewed goal fasting, and goal 2 hour post prandial glucose - Dr. Trixie approved Ozempic  increase to 1 mg and changing Lantus  to [place] - [employer] did fax that Ozempic  1 mg and Tresiba were approved - waiting for shipment -Endo appt scheduled for 11/11   Follow Up Plan: 4 weeks  Darrelyn Drum, PharmD, BCPS, CPP Clinical Pharmacist Practitioner [facility] [phone]

## 2024-05-02 NOTE — Patient Instructions (Signed)
 It was a pleasure speaking with you today!  Continue Ozempic  1 mg weekly and Lantus  20 units daily.   We will check on the status of Ozempic  and [place] (to replace Lantus ) from the patient assistance program.  Feel free to call with any questions or concerns!  Darrelyn Drum, PharmD, BCPS, CPP Clinical Pharmacist Practitioner [facility] [phone]

## 2024-05-03 ENCOUNTER — Telehealth: Payer: Self-pay

## 2024-05-03 NOTE — Telephone Encounter (Signed)
 Gave [employer] a call due to pt not receiving her meds per [employer] representative(Britaney) said for some reason they had put in the order on June13th, but did not know why it was not mail out they are going to expedited pt will received it with in 3-4 days. Gave pt a call and is aware she will be receiving a call from provider office once they received.

## 2024-05-04 ENCOUNTER — Telehealth: Payer: Self-pay

## 2024-05-04 NOTE — Telephone Encounter (Signed)
 Patient Assistance  Medication: Tresiba  Dosage:U200 Quantity:2  Medication:Ozempic   Dosage:1 mg Quantity:4  Medication:Ozempic   Dosage:0.25/0.5 mg Quantity:4  Supplies [employer] Pen Needles  2 boxes    Received date: 05/04/24

## 2024-05-11 NOTE — Addendum Note (Signed)
 Addended by: CLEOTILDE ROLIN RAMAN on: 05/11/2024 04:34 PM   Modules accepted: Orders

## 2024-05-16 ENCOUNTER — Telehealth: Payer: Self-pay | Admitting: Pharmacy Technician

## 2024-05-16 NOTE — Progress Notes (Signed)
   05/16/2024 Name: Bridget Butler MRN: [id number] DOB: October 15, 1946  Patient is appearing on a report for [employer] [place] Metric Diabetes and last engaged with the clinical pharmacist to discuss diabetes on 05/02/2024. Contacted patient today to discuss diabetes management and completed medication review.   Diabetes Plan from last clinical pharmacist appointment:  Diabetes: - Currently controlled per [facility] data, A1c goal <8%. Avg BG and time in target has improved since increasing Ozempic . Average was previously 194 early June, currently 155 and TIR improved from 67% to 76% - Reviewed goal fasting, and goal 2 hour post prandial glucose - Dr. Trixie approved Ozempic  increase to 1 mg and changing Lantus  to [place] - Novonordisk did fax that Ozempic  1 mg and Tresiba were approved - waiting for shipment -Endo appt scheduled for 11/11  -Follow Up Plan: 4 weeks(copy/paste from last note)   Medication Adherence Barriers Identified:  Patient made recommended medication changes per plan: Yes Patient informs she picked up her patient assistance medications of Ozempic  1mg  and Tresiba from Dr. Ara office. She ifnorms she is taking Ozmepic 1mg  weekly and 20 units of Tresiba daily.  Access issues with any new medication or testing device: No Patient reports adherence to her medications and has them on hand. Patient is checking blood sugars as prescribed: Yes Pa tient uses Freestyle CGM for monitoring blood sugars. She reports she thinks it says she is 85% in range. She informs the blood sugar have been in the 170s. She informs if she eats what she is not supposed to eat it goes to 250. She informs this morning it was fasting 117.   Medication Adherence Barriers Addressed/Actions Taken:  Reviewed medication changes per plan from last clinical pharmacist note Educated patient to contact pharmacy regarding new prescriptions/refills  Patient Assistance  for [place] and Ozmepic Patient Assistance Program  approved  Reviewed instructions for monitoring blood sugars at home and reminded patient to keep a written log to review with pharmacist Reminded patient of date/time of upcoming clinical pharmacist follow up and any upcoming PCP/specialists visits. Patient denies transportation barriers to the appointment. Yes  Next clinical pharmacist appointment is scheduled for: TBD   Kate Caddy, CPhT [facility] Office: [phone] Email: [email or web address]

## 2024-05-23 ENCOUNTER — Telehealth: Payer: Self-pay | Admitting: *Deleted

## 2024-05-23 NOTE — Progress Notes (Unsigned)
 Complex Care Management Care Guide Note  05/23/2024 Name: Bridget Butler MRN: [id number] DOB: Oct 10, 1946  Bridget Butler is a 78 y.o. year old female who is a primary care patient of Joshua Debby CROME, MD and is actively engaged with the care management team. I reached out to Bridget Butler by phone today to assist with re-scheduling  with the Pharmacist.  Follow up plan: Unsuccessful telephone outreach attempt made. A HIPAA compliant phone message was left for the patient providing contact information and requesting a return call.  Thedford Franks, CMA, Care Guide [facility]  [facility], [facility] Guide Direct Dial: [phone]  Fax: [phone] Website: [email or web address]

## 2024-05-25 NOTE — Progress Notes (Signed)
 Complex Care Management Care Guide Note  05/25/2024 Name: Bridget Butler MRN: [id number] DOB: Feb 09, 1946  Bridget Butler is a 78 y.o. year old female who is a primary care patient of Joshua Debby CROME, MD and is actively engaged with the care management team. I reached out to Bridget Butler by phone today to assist with re-scheduling  with the Pharmacist.  Follow up plan: Unsuccessful telephone outreach attempt made. A HIPAA compliant phone message was left for the patient providing contact information and requesting a return call. No further outreach attempts will be made due to inability to maintain patient contact.   Thedford Franks, CMA, [facility]  [facility], [facility] Guide Direct Dial: [phone]  Fax: [phone] Website: [email or web address]

## 2024-05-30 ENCOUNTER — Telehealth: Payer: Self-pay

## 2024-05-30 NOTE — Telephone Encounter (Signed)
 Copied from CRM #[id number]. Topic: General - Other >> May 30, 2024  9:20 AM Antwanette L wrote: Reason for CRM: Kate from [facility] is calling to get an update on the pt surgical clearance. Kate is requesting a callback at [phone]

## 2024-05-31 NOTE — Telephone Encounter (Signed)
 Unable to reach patient. Left message for patient to schedule an appointment with Dr. Joshua so she can have her surgical clearance done.

## 2024-05-31 NOTE — Telephone Encounter (Signed)
 no

## 2024-05-31 NOTE — Telephone Encounter (Signed)
 Patient is wondering will you sign this surgical clearance for her. They keep sending it to us  but she hasn't seen Dr. Joshua since 07/2023.

## 2024-06-01 ENCOUNTER — Ambulatory Visit: Admitting: Family Medicine

## 2024-06-01 NOTE — Progress Notes (Deleted)
   Established Patient Office Visit  Subjective:     Patient ID: Bridget Butler, female    DOB: 1946/09/20, 78 y.o.   MRN: [id number]  No chief complaint on file.   HPI  Discussed the use of AI scribe software for clinical note transcription with the patient, who gave verbal consent to proceed.  History of Present Illness      ROS Per HPI      Objective:    There were no vitals taken for this visit.   Physical Exam Vitals and nursing note reviewed.  Constitutional:      General: She is not in acute distress.    Appearance: Normal appearance. She is normal weight.  HENT:     Head: Normocephalic and atraumatic.     Right Ear: External ear normal.     Left Ear: External ear normal.     Nose: Nose normal.     Mouth/Throat:     Mouth: Mucous membranes are moist.     Pharynx: Oropharynx is clear.  Eyes:     Extraocular Movements: Extraocular movements intact.     Pupils: Pupils are equal, round, and reactive to light.  Cardiovascular:     Rate and Rhythm: Normal rate and regular rhythm.     Pulses: Normal pulses.     Heart sounds: Normal heart sounds.  Pulmonary:     Effort: Pulmonary effort is normal. No respiratory distress.     Breath sounds: Normal breath sounds. No wheezing, rhonchi or rales.  Musculoskeletal:        General: Normal range of motion.     Cervical back: Normal range of motion.     Right lower leg: No edema.     Left lower leg: No edema.  Lymphadenopathy:     Cervical: No cervical adenopathy.  Neurological:     General: No focal deficit present.     Mental Status: She is alert and oriented to person, place, and time.  Psychiatric:        Mood and Affect: Mood normal.        Thought Content: Thought content normal.     No results found for any visits on 06/01/24.  The 10-year ASCVD risk score (Arnett DK, et al., 2019) is: 41.6%  {Vitals History (Optional):23777}  {Show previous labs (optional):23779}     Assessment & Plan:    Assessment and Plan Assessment & Plan      No orders of the defined types were placed in this encounter.    No orders of the defined types were placed in this encounter.   No follow-ups on file.  Corean LITTIE Ku, FNP

## 2024-06-01 NOTE — Telephone Encounter (Signed)
 Patient has been scheduled to see stephanie matthews today at 2:40 for her surgical clearance.

## 2024-06-02 DIAGNOSIS — E113593 Type 2 diabetes mellitus with proliferative diabetic retinopathy without macular edema, bilateral: Secondary | ICD-10-CM | POA: Diagnosis not present

## 2024-06-02 DIAGNOSIS — H43811 Vitreous degeneration, right eye: Secondary | ICD-10-CM | POA: Diagnosis not present

## 2024-06-03 ENCOUNTER — Ambulatory Visit: Admitting: Family Medicine

## 2024-06-03 NOTE — Progress Notes (Unsigned)
 AI Surgery Clearance  Subjective:     Patient ID: Bridget Butler, female    DOB: 08/20/1946, 78 y.o.   MRN: [id number]  No chief complaint on file.   HPI  Discussed the use of AI scribe software for clinical note transcription with the patient, who gave verbal consent to proceed.  History of Present Illness      Pt is a 78 y.o. female who is here for preoperative clearance for diagnostic hysteroscopy, dilation and curettage, polypectomy.  1) High Risk Cardiac Conditions:  1) Recent MI - No.  2) Decompensated Heart Failure - No.  3) Unstable angina - No.  4) Symptomatic arrythmia - No.  5) Sx Valvular Disease - No.  2) Intermediate Risk Factors: DM, CKD, CVA, CHF, CAD - Yes.    2) Functional Status: > 4 mets (Walk, run, climb stairs) Yes.  [name]   3) Surgery Specific Risk: High (Emergency, Vascular, Intra-abdominal, Extensive ops)  4) Further Noninvasive evaluation:   1) EKG - Yes.     Hx of MI, CVA, CAD, DM, CKD  2) Echo - No.   Worsening dyspnea or CHF without an echo in the past year  3) Stress Testing - Active Cardiac Disease - No.  4) CXR No.   If asymptomatic, healthy, no respiratory symptoms it is not needed  5)PFTs No.   OSA, OHS, or significant cardiopulmonary history  5) Need for medical therapy - Beta Blocker, Statins indicated ? Yes.    ROS Per HPI      Objective:    There were no vitals taken for this visit.   Physical Exam  No results found for any visits on 06/03/24.  The 10-year ASCVD risk score (Arnett DK, et al., 2019) is: 41.6%  BP Readings from Last 3 Encounters:  04/27/24 124/60  02/23/24 124/60  02/03/24 (!) 118/52   Wt Readings from Last 3 Encounters:  04/27/24 183 lb 9.6 oz (83.3 kg)  02/23/24 187 lb (84.8 kg)  02/03/24 189 lb (85.7 kg)      Last CBC Lab Results  Component Value Date   WBC 5.9 01/04/2024   HGB 11.2 (L) 01/04/2024   HCT 34.9 (L) 01/04/2024   MCV 98.1 01/04/2024   MCH 31.3 07/11/2020   RDW 14.3  01/04/2024   PLT 326.0 01/04/2024   Last metabolic panel Lab Results  Component Value Date   GLUCOSE 207 (H) 01/04/2024   NA 136 01/04/2024   K 4.1 01/04/2024   CL 101 01/04/2024   CO2 28 01/04/2024   BUN 12 01/04/2024   CREATININE 1.05 01/04/2024   GFR 51.18 (L) 01/04/2024   CALCIUM  10.0 01/04/2024   PROT 7.0 07/28/2023   ALBUMIN 4.1 07/28/2023   LABGLOB 2.9 06/04/2020   AGRATIO 1.3 06/04/2020   BILITOT 0.8 07/28/2023   ALKPHOS 92 07/28/2023   AST 17 07/28/2023   ALT 20 07/28/2023   ANIONGAP 9 07/11/2020   Last lipids Lab Results  Component Value Date   CHOL 207 (H) 07/28/2023   HDL 72.30 07/28/2023   LDLCALC 99 07/28/2023   TRIG 178.0 (H) 07/28/2023   CHOLHDL 3 07/28/2023   Last hemoglobin A1c Lab Results  Component Value Date   HGBA1C 7.9 (A) 04/27/2024   Last thyroid  functions Lab Results  Component Value Date   TSH 0.36 01/04/2024   T4TOTAL 5.8 07/28/2023   THYROIDAB 3 07/28/2023   Last vitamin D  Lab Results  Component Value Date   VD25OH 37.63 05/01/2021  Last vitamin B12 and Folate Lab Results  Component Value Date   VITAMINB12 466 12/30/2022   FOLATE >23.9 12/30/2022         Assessment & Plan:   Assessment and Plan Assessment & Plan      I have independently evaluated patient.  Bridget Butler is a 78 y.o. female who is moderate risk for a high risk surgery.  There are not modifiable risk factors (smoking, etc). Bridget Butler's RCRI/NSQIP calculation for MACE is: ***.    Review meds: If indicated, NO ACE-I/ARB day of surgery. OK to do BB or statin day of (esp if vascular sx)  No orders of the defined types were placed in this encounter.    No orders of the defined types were placed in this encounter.   No follow-ups on file.  Bridget LITTIE Ku, FNP

## 2024-06-09 ENCOUNTER — Encounter: Payer: Self-pay | Admitting: Obstetrics and Gynecology

## 2024-06-09 ENCOUNTER — Ambulatory Visit: Admitting: Obstetrics and Gynecology

## 2024-06-09 VITALS — BP 122/64 | HR 76 | Temp 98.2°F | Ht 63.25 in | Wt 183.0 lb

## 2024-06-09 DIAGNOSIS — N950 Postmenopausal bleeding: Secondary | ICD-10-CM | POA: Diagnosis not present

## 2024-06-09 DIAGNOSIS — N9489 Other specified conditions associated with female genital organs and menstrual cycle: Secondary | ICD-10-CM | POA: Diagnosis not present

## 2024-06-09 NOTE — Patient Instructions (Addendum)
 Preoperative instructions: Nothing to eat or drink after midnight, unless instructed differently regarding clear liquids by the anesthesia team at [facility] health.  Stop ozempic  2 weeks prior to surgery.  Do not take any medications on the day of surgery, except those listed below: Norvasc  Half dose of insulin  glargine Olmestaran (if okay by anesthesia) Prilosec  Please follow all other instructions as provided by our surgical scheduler at [facility] and the anesthesia team at [facility] health.

## 2024-06-09 NOTE — Progress Notes (Signed)
 78 y.o. [id number] female with PMB, uterine prolapse, mixed urinary incontinence here for follow-up appointment. Widowed.  No LMP recorded. Patient is postmenopausal.   She previously reported Spotting on and off for a year.  She had 1 day of heavy bleeding last month.  She is concerned that her bleeding is related to poorly controlled diabetes. Her fastings are between 170 and 200s.  Today she notes: Ongoing spotting since last appointment. Concerned that urinary incontinence will worsen with reduction of prolapse. Has been unable to get in with her PCP. No CP or SOB  07/28/23 Normal EKG  04/27/24 A1c 7.9 01/04/24 Hgb 11, GFR 51, Cr 1.05  Last mammogram: 08/05/23 BIRADS 2 Sexually active: no   02/23/24 TVUS: Indications: Postmenopausal bleeding  Findings:   Uterus: 9.9 x 7.1 x 7.8 cm, anteverted uterus. Endometrial thickness: 57 mm.  Endometrium is fluid-filled with multiple vascular masses noted. Left ovary: Not visualized Right ovary: Not visualized No free fluid.  Impression:  Thickened endometrium with multiple vascular masses noted.  Vera LULLA Pa, MD  GYN HISTORY: No significant history  OB History  Gravida Para Term Preterm AB Living  3 3 3   3   SAB IAB Ectopic Multiple Live Births      2    # Outcome Date GA Lbr Len/2nd Weight Sex Type Anes PTL Lv  3 Term      Vag-Spont   LIV  2 Term      Vag-Spont   LIV  1 Term      Vag-Spont       Past Medical History:  Diagnosis Date   Cataract    Diabetes mellitus without complication (HCC)    Dyspnea    on exertion   GERD (gastroesophageal reflux disease)    Hyperlipidemia    Hypertension    Multiple thyroid  nodules    Thyroid  disease     Past Surgical History:  Procedure Laterality Date   CHOLECYSTECTOMY     COSMETIC SURGERY Bilateral    eyelids   EYE SURGERY     GALLBLADDER SURGERY     MICROLARYNGOSCOPY WITH CO2 LASER AND EXCISION OF VOCAL CORD LESION N/A 04/02/2020   Procedure: MICROLARYNGOSCOPY  WITH DILATION AND CO2 LASER AND EXCISION OF VOCAL CORD LESION w/MITOMYCIN  C;  Surgeon: Carlie Clark, MD;  Location: [facility] OR;  Service: ENT;  Laterality: N/A;   MICROLARYNGOSCOPY WITH DILATION N/A 05/23/2020   Procedure: MICROLARYNGOSCOPY W/ DILATION AND JET VENTILATION;  Surgeon: Carlie Clark, MD;  Location: [facility] OR;  Service: ENT;  Laterality: N/A;   MICROLARYNGOSCOPY WITH DILATION N/A 07/13/2020   Procedure: MICROLARYNGOSCOPY with Jet Ventilation;  Surgeon: Carlie Clark, MD;  Location: [facility] OR;  Service: ENT;  Laterality: N/A;   TRACHEOSTOMY TUBE PLACEMENT N/A 04/02/2020   Procedure: Awake TRACHEOSTOMY;  Surgeon: Carlie Clark, MD;  Location: [facility] OR;  Service: ENT;  Laterality: N/A;   TUBAL LIGATION      Current Outpatient Medications on File Prior to Visit  Medication Sig Dispense Refill   alendronate  (FOSAMAX ) 70 MG tablet Take 1 tablet (70 mg total) by mouth once a week. Take with a full glass of water on an empty stomach. 4 tablet 2   amLODipine  (NORVASC ) 5 MG tablet Take 1 tablet (5 mg total) by mouth daily. 90 tablet 0   ASPIRIN  CHILDRENS PO Take by mouth daily.     Blood Glucose Monitoring Suppl (TRUE METRIX METER) DEVI 1 Device by Does not apply route 2 (two) times daily. 1 each  2   Cholecalciferol  50 MCG (2000 UT) TABS Take 1 tablet (2,000 Units total) by mouth daily. 90 tablet 1   Continuous Glucose Sensor (FREESTYLE LIBRE 3 PLUS SENSOR) MISC 1 each by Does not apply route every 14 (fourteen) days. 6 each 3   glucose blood (TRUE METRIX BLOOD GLUCOSE TEST) test strip 1 each by Other route 2 (two) times daily. USE AS INSTRUCTED TO CHECK BLOOD SUGAR ONE TIME DAILY 100 strip 4   insulin  glargine (LANTUS  SOLOSTAR) 100 UNIT/ML Solostar Pen Inject 24 Units into the skin daily. (Patient taking differently: Inject 20 Units into the skin daily.) 30 mL 3   Insulin  Pen Needle 32G X 6 MM MISC 1 Act by Does not apply route once a week. 100 each 1   Multiple Vitamin (MULTIVITAMIN WITH MINERALS) TABS tablet  Take 1 tablet by mouth daily.     olmesartan  (BENICAR ) 20 MG tablet Take 1 tablet (20 mg total) by mouth daily. 90 tablet 0   omeprazole  (PRILOSEC) 40 MG capsule TAKE 1 CAPSULE BY MOUTH TWICE DAILY IN THE MORNING AND AT BEDTIME 180 capsule 0   potassium chloride  (KLOR-CON  10) 10 MEQ tablet Take 1 tablet (10 mEq total) by mouth 2 (two) times daily. 180 tablet 1   pravastatin  (PRAVACHOL ) 40 MG tablet Take 1 tablet (40 mg total) by mouth at bedtime. 90 tablet 1   Semaglutide  (OZEMPIC , 2 MG/DOSE, [place]) Inject 0.5 mg into the skin once a week. Getting through PAP (Patient taking differently: Inject 1 mg into the skin once a week. Getting through PAP)     Semaglutide ,0.25 or 0.5MG /DOS, (OZEMPIC , 0.25 OR 0.5 MG/DOSE,) 2 MG/3ML SOPN Inject 0.5mg  into skin weekly     No current facility-administered medications on file prior to visit.    Allergies  Allergen Reactions   Metformin  And Related Diarrhea      PE Today's Vitals   06/09/24 0804  BP: 122/64  Pulse: 76  Temp: 98.2 F (36.8 C)  TempSrc: Oral  SpO2: 99%  Weight: 183 lb (83 kg)  Height: 5' 3.25 (1.607 m)   Body mass index is 32.16 kg/m.  Physical Exam Vitals reviewed.  Constitutional:      General: She is not in acute distress.    Appearance: Normal appearance.  HENT:     Head: Normocephalic and atraumatic.     Nose: Nose normal.  Eyes:     Extraocular Movements: Extraocular movements intact.     Conjunctiva/sclera: Conjunctivae normal.  Cardiovascular:     Rate and Rhythm: Normal rate and regular rhythm.     Heart sounds: No murmur heard.    No friction rub. No gallop.  Pulmonary:     Effort: Pulmonary effort is normal. No respiratory distress.     Breath sounds: Normal breath sounds. No stridor. No wheezing, rhonchi or rales.  Musculoskeletal:        General: Normal range of motion.     Cervical back: Normal range of motion.  Neurological:     General: No focal deficit present.     Mental Status: She is alert.   Psychiatric:        Mood and Affect: Mood normal.        Behavior: Behavior normal.     Assessment and Plan:        PMB (postmenopausal bleeding)  Endometrial mass  Plan for diagnostic hysteroscopy, dilation and curettage, polypectomy. Awaiting surgical clearance from PCP- current appt scheduled for 09/05/24. Message sent via EPIC to  expedite care.  Discussed outpatient procedure. Reviewed that  recovery is usually 1-2 days. Risks including infections, bleeding, and damage to surrounding organs reviewed. Recommend NPO prior to midnight and reviewed medication to take on day of surgery. Dicussed use of NSAIDS as needed for pain postoperatively.  Preop checklist: Antibiotics: none DVT ppx: SCDs Postop visit: 1 week Additional clearance: medical for Diabetes (A1c at goal), HTN, CKD, GERD, anemia    Plan for BMP and repeat EKG if unable to be seen by PCP within 2 weeks Given imaging findings, there is a high risk for malignancy. I would not recommend waiting until November for diagnostic procedure.  Vera LULLA Pa, MD

## 2024-06-21 ENCOUNTER — Other Ambulatory Visit: Payer: Self-pay | Admitting: *Deleted

## 2024-06-21 DIAGNOSIS — N9489 Other specified conditions associated with female genital organs and menstrual cycle: Secondary | ICD-10-CM

## 2024-06-21 DIAGNOSIS — N950 Postmenopausal bleeding: Secondary | ICD-10-CM

## 2024-06-29 ENCOUNTER — Ambulatory Visit: Admitting: Obstetrics and Gynecology

## 2024-06-29 ENCOUNTER — Other Ambulatory Visit (HOSPITAL_COMMUNITY)
Admission: RE | Admit: 2024-06-29 | Discharge: 2024-06-29 | Disposition: A | Discharge status: Home / Self Care | Source: Ambulatory Visit | Attending: Obstetrics and Gynecology | Admitting: Obstetrics and Gynecology

## 2024-06-29 ENCOUNTER — Encounter: Payer: Self-pay | Admitting: Obstetrics and Gynecology

## 2024-06-29 VITALS — BP 126/62 | HR 83 | Temp 98.1°F | Wt 187.0 lb

## 2024-06-29 DIAGNOSIS — N9489 Other specified conditions associated with female genital organs and menstrual cycle: Secondary | ICD-10-CM

## 2024-06-29 DIAGNOSIS — N950 Postmenopausal bleeding: Secondary | ICD-10-CM | POA: Insufficient documentation

## 2024-06-29 NOTE — Progress Notes (Signed)
 78 y.o. [id number] female with PMB, uterine prolapse, mixed urinary incontinence here for EMB. Widowed.  No LMP recorded. Patient is postmenopausal.   She previously reported Spotting on and off for a year.  She had 1 day of heavy bleeding last month.  She is concerned that her bleeding is related to poorly controlled diabetes. Her fastings are between 170 and 200s.  She has been unable to get in with her PCP for medical clearance and is scheduled for 07/18/24. She presents for EMB today given prolonged medical clearance process.  Last mammogram: 08/05/23 BIRADS 2 Sexually active: no   02/23/24 TVUS: Indications: Postmenopausal bleeding  Findings:   Uterus: 9.9 x 7.1 x 7.8 cm, anteverted uterus. Endometrial thickness: 57 mm.  Endometrium is fluid-filled with multiple vascular masses noted. Left ovary: Not visualized Right ovary: Not visualized No free fluid.  Impression:  Thickened endometrium with multiple vascular masses noted.  Vera LULLA Pa, MD  GYN HISTORY: No significant history  OB History  Gravida Para Term Preterm AB Living  3 3 3   3   SAB IAB Ectopic Multiple Live Births      2    # Outcome Date GA Lbr Len/2nd Weight Sex Type Anes PTL Lv  3 Term      Vag-Spont   LIV  2 Term      Vag-Spont   LIV  1 Term      Vag-Spont       Past Medical History:  Diagnosis Date   Cataract    Diabetes mellitus without complication (HCC)    Dyspnea    on exertion   GERD (gastroesophageal reflux disease)    Hyperlipidemia    Hypertension    Multiple thyroid  nodules    Thyroid  disease     Past Surgical History:  Procedure Laterality Date   CHOLECYSTECTOMY     COSMETIC SURGERY Bilateral    eyelids   EYE SURGERY     GALLBLADDER SURGERY     MICROLARYNGOSCOPY WITH CO2 LASER AND EXCISION OF VOCAL CORD LESION N/A 04/02/2020   Procedure: MICROLARYNGOSCOPY WITH DILATION AND CO2 LASER AND EXCISION OF VOCAL CORD LESION w/MITOMYCIN  C;  Surgeon: Carlie Clark, MD;   Location: [facility] OR;  Service: ENT;  Laterality: N/A;   MICROLARYNGOSCOPY WITH DILATION N/A 05/23/2020   Procedure: MICROLARYNGOSCOPY W/ DILATION AND JET VENTILATION;  Surgeon: Carlie Clark, MD;  Location: [facility] OR;  Service: ENT;  Laterality: N/A;   MICROLARYNGOSCOPY WITH DILATION N/A 07/13/2020   Procedure: MICROLARYNGOSCOPY with Jet Ventilation;  Surgeon: Carlie Clark, MD;  Location: [facility] OR;  Service: ENT;  Laterality: N/A;   TRACHEOSTOMY TUBE PLACEMENT N/A 04/02/2020   Procedure: Awake TRACHEOSTOMY;  Surgeon: Carlie Clark, MD;  Location: [facility] OR;  Service: ENT;  Laterality: N/A;   TUBAL LIGATION      Current Outpatient Medications on File Prior to Visit  Medication Sig Dispense Refill   alendronate  (FOSAMAX ) 70 MG tablet Take 1 tablet (70 mg total) by mouth once a week. Take with a full glass of water on an empty stomach. 4 tablet 2   amLODipine  (NORVASC ) 5 MG tablet Take 1 tablet (5 mg total) by mouth daily. 90 tablet 0   ASPIRIN  CHILDRENS PO Take by mouth daily.     Blood Glucose Monitoring Suppl (TRUE METRIX METER) DEVI 1 Device by Does not apply route 2 (two) times daily. 1 each 2   Cholecalciferol  50 MCG (2000 UT) TABS Take 1 tablet (2,000 Units total) by mouth daily. 90  tablet 1   Continuous Glucose Sensor (FREESTYLE LIBRE 3 PLUS SENSOR) MISC 1 each by Does not apply route every 14 (fourteen) days. 6 each 3   glucose blood (TRUE METRIX BLOOD GLUCOSE TEST) test strip 1 each by Other route 2 (two) times daily. USE AS INSTRUCTED TO CHECK BLOOD SUGAR ONE TIME DAILY 100 strip 4   insulin  glargine (LANTUS  SOLOSTAR) 100 UNIT/ML Solostar Pen Inject 24 Units into the skin daily. (Patient taking differently: Inject 20 Units into the skin daily.) 30 mL 3   Insulin  Pen Needle 32G X 6 MM MISC 1 Act by Does not apply route once a week. 100 each 1   Multiple Vitamin (MULTIVITAMIN WITH MINERALS) TABS tablet Take 1 tablet by mouth daily.     olmesartan  (BENICAR ) 20 MG tablet Take 1 tablet (20 mg total) by mouth  daily. 90 tablet 0   omeprazole  (PRILOSEC) 40 MG capsule TAKE 1 CAPSULE BY MOUTH TWICE DAILY IN THE MORNING AND AT BEDTIME 180 capsule 0   potassium chloride  (KLOR-CON  10) 10 MEQ tablet Take 1 tablet (10 mEq total) by mouth 2 (two) times daily. 180 tablet 1   pravastatin  (PRAVACHOL ) 40 MG tablet Take 1 tablet (40 mg total) by mouth at bedtime. 90 tablet 1   Semaglutide  (OZEMPIC , 2 MG/DOSE, [place]) Inject 0.5 mg into the skin once a week. Getting through PAP (Patient taking differently: Inject 1 mg into the skin once a week. Getting through PAP)     Semaglutide ,0.25 or 0.5MG /DOS, (OZEMPIC , 0.25 OR 0.5 MG/DOSE,) 2 MG/3ML SOPN Inject 0.5mg  into skin weekly     No current facility-administered medications on file prior to visit.    Allergies  Allergen Reactions   Metformin  And Related Diarrhea      PE Today's Vitals   06/29/24 1023  BP: 126/62  Pulse: 83  Temp: 98.1 F (36.7 C)  TempSrc: Oral  SpO2: 98%  Weight: 187 lb (84.8 kg)    Body mass index is 32.86 kg/m.  Physical Exam Vitals reviewed. Exam conducted with a chaperone present.  Constitutional:      General: She is not in acute distress.    Appearance: Normal appearance.  HENT:     Head: Normocephalic and atraumatic.     Nose: Nose normal.  Eyes:     Extraocular Movements: Extraocular movements intact.     Conjunctiva/sclera: Conjunctivae normal.  Pulmonary:     Effort: Pulmonary effort is normal.  Genitourinary:    General: Normal vulva.     Exam position: Lithotomy position.     Vagina: Normal. No vaginal discharge.     Cervix: Normal. No cervical motion tenderness, discharge or lesion.     Uterus: Normal. With uterine prolapse. Not enlarged and not tender. Fixed: Stage 3 uterine prolapse.     Adnexa: Right adnexa normal and left adnexa normal.  Musculoskeletal:        General: Normal range of motion.     Cervical back: Normal range of motion.  Neurological:     General: No focal deficit present.     Mental  Status: She is alert.  Psychiatric:        Mood and Affect: Mood normal.        Behavior: Behavior normal.     Procedure Consent was signed. Timeout was performed. Speculum inserted into the vagina, cervix visualized and was prepped with Betadine.  Cervical block was performed with 10cc 1% lidocaine  (Lot [id number], Exp 2026/01). Cervix was dilated using the os finder.  A single-toothed tenaculum was placed on the anterior lip of the cervix to stabilize it.  The 3 mm pipelle was introduced into the endometrial cavity without difficulty to a depth of 12cm, suction initiated and a moderate amount of yellow fluid followed by moderate amount of tissue was obtained over several passes and sent to pathology.  The instruments were removed from the patient's vagina.  Minimal bleeding from the cervix was noted.  The patient tolerated the procedure well.    Assessment and Plan:        PMB (postmenopausal bleeding) -     Surgical pathology  Initially planned for diagnostic hysteroscopy, dilation and curettage, polypectomy. Awaiting surgical clearance from PCP- current appt scheduled for 07/18/24.  Will proceed with EMB given inability to receive medical clearance in timely fashion. Uncomplicated procedured Will call with results   Vera LULLA Pa, MD

## 2024-07-01 ENCOUNTER — Telehealth: Payer: Self-pay | Admitting: *Deleted

## 2024-07-01 NOTE — Telephone Encounter (Addendum)
 Spoke with patient, EMB 06/29/24. Reports spotting 9/10 and 9/11. Bleeding started again this afternoon about 15 min ago. Patient does not have pads, she put on a depends. States if she had to guess she may have 1/4 cup in pad, not saturated through.   Denies pain, fever, chills, N/V. Denies signs/symptoms anemia.  Advised if bleeding continues saturating pad q1-2 hrs and/or any new symptoms develop, ER for for further evaluation. Instructions also provided to reach on-call provider.  Advised patient to call with update on Monday. Advised Dr. Dallie is out of the office this afternoon, will send message for review and f/u if any additional recommendations once reviewed. Patient appreciative of call.   Routing to Dr. Dallie.

## 2024-07-01 NOTE — Telephone Encounter (Signed)
 No further recommendations.

## 2024-07-04 ENCOUNTER — Ambulatory Visit: Payer: Self-pay | Admitting: Obstetrics and Gynecology

## 2024-07-05 ENCOUNTER — Telehealth: Payer: Self-pay | Admitting: Obstetrics and Gynecology

## 2024-07-05 DIAGNOSIS — C763 Malignant neoplasm of pelvis: Secondary | ICD-10-CM

## 2024-07-05 NOTE — Telephone Encounter (Signed)
 Left message to call back.

## 2024-07-05 NOTE — Telephone Encounter (Signed)
 See encounter dated 07/05/24.   Routing FYI.   Encounter closed.

## 2024-07-05 NOTE — Telephone Encounter (Signed)
 Phone call returned to patient.   No answer.  I left a voice mail to return my call regarding results.

## 2024-07-05 NOTE — Telephone Encounter (Signed)
Patient returned call to Dr. Quincy Simmonds.

## 2024-07-05 NOTE — Telephone Encounter (Signed)
 Phone call to patient in Dr. Dallie absence.   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   Will make referral to GYN ONCOLOGY at [facility] for further evaluation and treatment.  Patient accepts this referral.   Hysteroscopy with dilation and curettage is not needed, and the endometrial biopsy has made a diagnosis.

## 2024-07-05 NOTE — Telephone Encounter (Signed)
 Phone call to patient to discuss endometrial biopsy results on behalf of Dr. Dallie, who is currently out of the office.   No answer, so I left a voice message to return my call regarding patient's endometrial biopsy result.  No details left.   Patient has serous carcinoma of the endometrium and will need referral to GYN ONC.

## 2024-07-06 ENCOUNTER — Encounter: Payer: Self-pay | Admitting: Gynecologic Oncology

## 2024-07-06 ENCOUNTER — Telehealth: Payer: Self-pay | Admitting: *Deleted

## 2024-07-06 ENCOUNTER — Encounter: Payer: Self-pay | Admitting: Oncology

## 2024-07-06 DIAGNOSIS — C541 Malignant neoplasm of endometrium: Secondary | ICD-10-CM

## 2024-07-06 NOTE — Telephone Encounter (Signed)
 Per review of EPIC, patient scheduled with Dr. Viktoria on 07/08/24.   Routing FYI.   Encounter closed.

## 2024-07-06 NOTE — Telephone Encounter (Signed)
 Spoke with the patient regarding the referral to GYN oncology. Patient scheduled as new patient with Dr Viktoria on 9/19 at 9:45 am.. Patient given an arrival time of 9:15 am.  Explained to the patient the the doctor will perform a pelvic exam at this visit. Patient given the policy that only one visitor allowed and that visitor must be over 16 yrs are allowed in the Cancer Center. Patient given the address/phone number for the clinic and that the center offers free valet service. Patient aware that masks optional.

## 2024-07-06 NOTE — Telephone Encounter (Signed)
 Urgent referral placed to GYN ONC.   Surgery referral closed.

## 2024-07-06 NOTE — Progress Notes (Signed)
 Called Bridget Butler and let her know a CT scan has been scheduled for 07/12/24 at 5:00 [facility].  She will need to arrive at 2:45 to check in to drink the contrast.  She verbalized understanding and agreement.

## 2024-07-07 ENCOUNTER — Telehealth: Payer: Self-pay | Admitting: *Deleted

## 2024-07-07 NOTE — Telephone Encounter (Signed)
 Per Dr Viktoria fax surgical optimization form to the patient's PCP (Dr Joshua [phone]) and endocrinology offices (Dr Trixie [phone])

## 2024-07-07 NOTE — H&P (View-Only) (Signed)
 GYNECOLOGIC ONCOLOGY NEW PATIENT CONSULTATION   Patient Name: Bridget Butler  Patient Age: 78 y.o. Date of Service: 07/08/24 Referring Provider: Bobie Cary, MD  Primary Care Provider: Joshua Debby CROME, MD Consulting Provider: Comer Dollar, MD   Assessment/Plan:  Postmenopausal patient with uterine serous carcinoma.  We reviewed the nature of endometrial cancer and its recommended surgical staging, including total hysterectomy, bilateral salpingo-oophorectomy, and lymph node assessment. The patient is a suitable candidate for staging via a minimally invasive approach to surgery.  We reviewed that robotic assistance would be used to complete the surgery.   We discussed that most endometrial cancer is detected early, however, we reviewed that adjuvant therapy will likely be recommended based on the patient's biopsy, however, we will defer to final pathology results.    Given her high risk histology, I recommend CT scan preoperatively to rule out metastatic disease. This is already scheduled for 9/23.  We reviewed the sentinel lymph node technique. Risks and benefits of sentinel lymph node biopsy was reviewed. We reviewed the technique and ICG dye. The patient DOES NOT have an iodine allergy or known liver dysfunction. We reviewed the false negative rate (0.4%), and that 3% of patients with metastatic disease will not have it detected by SLN biopsy in endometrial cancer. A low risk of allergic reaction to the dye, <0.2% for ICG, has been reported. We also discussed that in the case of failed mapping, which occurs 40% of the time, a bilateral or unilateral lymphadenectomy will be performed at the surgeon's discretion.   Potential benefits of sentinel nodes including a higher detection rate for metastasis due to ultrastaging and potential reduction in operative morbidity. However, there remains uncertainty as to the role for treatment of micrometastatic disease. Further, the benefit of operative  morbidity associated with the SLN technique in endometrial cancer is not yet completely known. In other patient populations (e.g. the cervical cancer population) there has been observed reductions in morbidity with SLN biopsy compared to pelvic lymphadenectomy. Lymphedema, nerve dysfunction and lymphocysts are all potential risks with the SLN technique as with complete lymphadenectomy. Additional risks to the patient include the risk of damage to an internal organ while operating in an altered view (e.g. the black and white image of the robotic fluorescence imaging mode).   We discussed the plan for a robotic assisted hysterectomy, bilateral salpingo-oophorectomy, sentinel lymph node evaluation, possible lymph node dissection, omentectomy, possible laparotomy. The risks of surgery were discussed in detail and she understands these to include infection; wound separation; hernia; vaginal cuff separation, injury to adjacent organs such as bowel, bladder, blood vessels, ureters and nerves; bleeding which may require blood transfusion (she refuses); anesthesia risk; thromboembolic events; possible death; unforeseen complications; possible need for re-exploration; medical complications such as heart attack, stroke, pleural effusion and pneumonia; and, if full lymphadenectomy is performed the risk of lymphedema and lymphocyst. The patient will receive DVT and antibiotic prophylaxis as indicated. She voiced a clear understanding. She had the opportunity to ask questions. Perioperative instructions were reviewed with her. Prescriptions for post-op medications were sent to her pharmacy of choice.  We discussed the importance of good glycemic control around the time of surgery to decrease the risk of postoperative infection, improve wound healing, and decrease the risk of cardiac events.  She is aware of what foods to limit to keep her blood sugars under 200.  Patient is a Scientist, product/process development.  She refuses blood  transfusion.  My office will fill out with her refusal of  blood product paperwork today.  Given stage III pelvic organ prolapse and urinary incontinence, referral had been made to urogynecology.  She is scheduled to see Dr. Guadlupe next week.  We will coordinate a joint day for surgery.  A copy of this note was sent to the patient's referring provider.   75 minutes of total time was spent for this patient encounter, including preparation, face-to-face counseling with the patient and coordination of care, and documentation of the encounter.  Comer Dollar, MD  Division of Gynecologic Oncology  Department of Obstetrics and Gynecology  [facility]  ___________________________________________  Chief Complaint: No chief complaint on file.   History of Present Illness:  Bridget Butler is a 78 y.o. y.o. female who is seen in consultation at the request of Dr. Nikki for an evaluation of endometrial cancer.  The patient was initially seen in April with a history of 1 year of intermittent vaginal bleeding. She was also noted to have mixed urinary incontinence and pelvic prolapse at that visit. Pelvic ultrasound in May 2025 showed a uterus measuring 9.9 x 7.1 x 7.8 cm with an endometrial lining of 57 mm, fluid filled with multiple vascular masses.  Initial plan for hysteroscopy with D&C and polypectomy. Given delay in surgical clearance, EMB was ultimately performed on 06/29/24. Pathology revealed serous carcinoma, p53 abnormal, MMRp.  Her last POC Hgb A1c was 7.9% in July.  The patient comes in with her daughter today.  She endorses pink or brown spotting that would happen on average once a week starting in May.  She had some bright red bleeding after her recent endometrial biopsy.  She denies any associated pain or cramping.  She began Ozempic  for her diabetes in April, which has caused decrease in appetite.  She has lost about 4 pounds.  Denies nausea or emesis. She  endorses intermittent constipation at baseline, no recent changes.  Since May, she feels a bulge within her vagina that she has to push in to urinate.   Patient is a Scientist, product/process development.  In terms of her diabetes, she wears a continuous meter, CBGs ranged between 130 and 250.  Cereal tends to be what causes her to go up to 250.  Still takes long-acting insulin  although has decreased her dose to 16 units in the morning.  PAST MEDICAL HISTORY:  Past Medical History:  Diagnosis Date   Cataract    had surgery   Diabetes mellitus without complication (HCC)    GERD (gastroesophageal reflux disease)    Hyperlipidemia    Hypertension    Multiple thyroid  nodules    Thyroid  disease      PAST SURGICAL HISTORY:  Past Surgical History:  Procedure Laterality Date   CHOLECYSTECTOMY     COSMETIC SURGERY Bilateral    eyelids   EYE SURGERY     GALLBLADDER SURGERY     MICROLARYNGOSCOPY WITH CO2 LASER AND EXCISION OF VOCAL CORD LESION N/A 04/02/2020   Procedure: MICROLARYNGOSCOPY WITH DILATION AND CO2 LASER AND EXCISION OF VOCAL CORD LESION w/MITOMYCIN  C;  Surgeon: Carlie Clark, MD;  Location: [facility] OR;  Service: ENT;  Laterality: N/A;   MICROLARYNGOSCOPY WITH DILATION N/A 05/23/2020   Procedure: MICROLARYNGOSCOPY W/ DILATION AND JET VENTILATION;  Surgeon: Carlie Clark, MD;  Location: [facility] [place] OR;  Service: ENT;  Laterality: N/A;   MICROLARYNGOSCOPY WITH DILATION N/A 07/13/2020   Procedure: MICROLARYNGOSCOPY with Jet Ventilation;  Surgeon: Carlie Clark, MD;  Location: [facility] OR;  Service: ENT;  Laterality: N/A;  TRACHEOSTOMY TUBE PLACEMENT N/A 04/02/2020   Procedure: Awake TRACHEOSTOMY;  Surgeon: Carlie Clark, MD;  Location: [facility] OR;  Service: ENT;  Laterality: N/A;   TUBAL LIGATION      OB/GYN HISTORY:  OB History  Gravida Para Term Preterm AB Living  3 3 3   3   SAB IAB Ectopic Multiple Live Births      2    # Outcome Date GA Lbr Len/2nd Weight Sex Type Anes PTL Lv  3 Term      Vag-Spont   LIV  2 Term       Vag-Spont   LIV  1 Term      Vag-Spont       No LMP recorded. Patient is postmenopausal.  Age at menarche: 20  Age at menopause: 84 Hx of HRT: denies Hx of STDs: denies History of abnormal pap smears: denies  SCREENING STUDIES:  Last mammogram: 2024  Last colonoscopy: 2022 Last bone mineral density: 2024  MEDICATIONS: Outpatient Encounter Medications as of 07/08/2024  Medication Sig   alendronate  (FOSAMAX ) 70 MG tablet Take 1 tablet (70 mg total) by mouth once a week. Take with a full glass of water on an empty stomach.   amLODipine  (NORVASC ) 5 MG tablet Take 1 tablet (5 mg total) by mouth daily.   ASPIRIN  CHILDRENS PO Take by mouth daily.   Blood Glucose Monitoring Suppl (TRUE METRIX METER) DEVI 1 Device by Does not apply route 2 (two) times daily.   calcium  carbonate (OSCAL) 1500 (600 Ca) MG TABS tablet Take 600 mg of elemental calcium  by mouth daily with breakfast.   Cholecalciferol  50 MCG (2000 UT) TABS Take 1 tablet (2,000 Units total) by mouth daily.   CINNAMON PO Take by mouth.   Continuous Glucose Sensor (FREESTYLE LIBRE 3 PLUS SENSOR) MISC 1 each by Does not apply route every 14 (fourteen) days.   FIBER PO Take by mouth.   glucose blood (TRUE METRIX BLOOD GLUCOSE TEST) test strip 1 each by Other route 2 (two) times daily. USE AS INSTRUCTED TO CHECK BLOOD SUGAR ONE TIME DAILY   Insulin  Degludec (TRESIBA [place]) Inject 16 Units into the skin daily.   Insulin  Pen Needle 32G X 6 MM MISC 1 Act by Does not apply route once a week.   Multiple Vitamin (MULTIVITAMIN WITH MINERALS) TABS tablet Take 1 tablet by mouth daily.   olmesartan  (BENICAR ) 20 MG tablet Take 1 tablet (20 mg total) by mouth daily.   omeprazole  (PRILOSEC) 40 MG capsule TAKE 1 CAPSULE BY MOUTH TWICE DAILY IN THE MORNING AND AT BEDTIME   potassium chloride  (KLOR-CON  10) 10 MEQ tablet Take 1 tablet (10 mEq total) by mouth 2 (two) times daily.   pravastatin  (PRAVACHOL ) 40 MG tablet Take 1 tablet (40 mg total) by mouth  at bedtime.   Semaglutide ,0.25 or 0.5MG /DOS, (OZEMPIC , 0.25 OR 0.5 MG/DOSE,) 2 MG/3ML SOPN Inject 0.5mg  into skin weekly   senna-docusate (SENOKOT-S) 8.6-50 MG tablet Take 2 tablets by mouth at bedtime. For AFTER surgery, do not take if having diarrhea   traMADol  (ULTRAM ) 50 MG tablet Take 1 tablet (50 mg total) by mouth every 6 (six) hours as needed for moderate pain (pain score 4-6). For AFTER surgery only, do not take and drive   insulin  glargine (LANTUS  SOLOSTAR) 100 UNIT/ML Solostar Pen Inject 24 Units into the skin daily. (Patient not taking: Reported on 07/06/2024)   [DISCONTINUED] Semaglutide  (OZEMPIC , 2 MG/DOSE, [place]) Inject 0.5 mg into the skin once a week. Getting through  PAP   No facility-administered encounter medications on file as of 07/08/2024.    ALLERGIES:  Allergies  Allergen Reactions   Metformin  And Related Diarrhea     FAMILY HISTORY:  Family History  Problem Relation Age of Onset   Heart disease Mother    Hypertension Mother    Cancer Sister        Uterine Cancer?   Diabetes Sister    Breast cancer Maternal Aunt    Breast cancer Maternal Aunt    Breast cancer Cousin    Prostate cancer Neg Hx    Ovarian cancer Neg Hx    Pancreatic cancer Neg Hx    Colon cancer Neg Hx      SOCIAL HISTORY:  Social Connections: Moderately Integrated (09/04/2023)   Social Connection and Isolation Panel    Frequency of Communication with Friends and Family: More than three times a week    Frequency of Social Gatherings with Friends and Family: More than three times a week    Attends Religious Services: More than 4 times per year    Active Member of [employer] or Organizations: Yes    Attends Banker Meetings: 1 to 4 times per year    Marital Status: Widowed    REVIEW OF SYSTEMS:  + diarrhea, blood in urine, pelvic pain, incontinence, muscle pain/cramps, vaginal discharge Denies appetite changes, fevers, chills, fatigue, unexplained weight changes. Denies hearing  loss, neck lumps or masses, mouth sores, ringing in ears or voice changes. Denies cough or wheezing.  Denies shortness of breath. Denies chest pain or palpitations. Denies leg swelling. Denies abdominal distention, pain, blood in stools, constipation, nausea, vomiting, or early satiety. Denies pain with intercourse, dysuria, frequency, or incontinence. Denies hot flashes.   Denies joint pain. Denies itching, rash, or wounds. Denies dizziness, headaches, numbness or seizures. Denies swollen lymph nodes or glands, denies easy bruising or bleeding. Denies anxiety, depression, confusion, or decreased concentration.  Physical Exam:  Vital Signs for this encounter:  Blood pressure (!) 144/58, pulse 83, temperature 98.5 F (36.9 C), temperature source Oral, resp. rate 19, height 5' 3 (1.6 m), weight 183 lb 9.6 oz (83.3 kg), SpO2 100%. Body mass index is 32.52 kg/m. General: Alert, oriented, no acute distress.  HEENT: Normocephalic, atraumatic. Sclera anicteric.  Chest: Clear to auscultation bilaterally. No wheezes, rhonchi, or rales. Cardiovascular: Regular rate and rhythm, no murmurs, rubs, or gallops.  Abdomen: Normoactive bowel sounds. Soft, nondistended, nontender to palpation. No masses or hepatosplenomegaly appreciated. No palpable fluid wave.  Extremities: Grossly normal range of motion. Warm, well perfused. No edema bilaterally.  Skin: No rashes or lesions.  Lymphatics: No cervical, supraclavicular, or inguinal adenopathy.  GU:  Normal external female genitalia. No lesions. No discharge or bleeding.             Vagina: Mildly atrophic.             Cervix: Normal appearing, no lesions.  Cervix past the level of the introitus.             Uterus: mobile, somewhat bulbous LUS, no parametrial involvement or nodularity.             Adnexa: No masses appreciated.  Rectal: Deferred.  LABORATORY AND RADIOLOGIC DATA:  Outside medical records were reviewed to synthesize the above history,  along with the history and physical obtained during the visit.   Lab Results  Component Value Date   WBC 5.9 01/04/2024   HGB 11.2 (L) 01/04/2024  HCT 34.9 (L) 01/04/2024   PLT 326.0 01/04/2024   GLUCOSE 207 (H) 01/04/2024   CHOL 207 (H) 07/28/2023   TRIG 178.0 (H) 07/28/2023   HDL 72.30 07/28/2023   LDLCALC 99 07/28/2023   ALT 20 07/28/2023   AST 17 07/28/2023   NA 136 01/04/2024   K 4.1 01/04/2024   CL 101 01/04/2024   CREATININE 1.05 01/04/2024   BUN 12 01/04/2024   CO2 28 01/04/2024   TSH 0.36 01/04/2024   HGBA1C 7.9 (A) 04/27/2024   MICROALBUR 29.8 04/27/2024

## 2024-07-07 NOTE — Progress Notes (Unsigned)
 GYNECOLOGIC ONCOLOGY NEW PATIENT CONSULTATION   Patient Name: Bridget Butler  Patient Age: 78 y.o. Date of Service: 07/08/24 Referring Provider: Bobie Cary, MD  Primary Care Provider: Joshua Debby CROME, MD Consulting Provider: Comer Dollar, MD   Assessment/Plan:  Postmenopausal patient with uterine serous carcinoma.  We reviewed the nature of endometrial cancer and its recommended surgical staging, including total hysterectomy, bilateral salpingo-oophorectomy, and lymph node assessment. The patient is a suitable candidate for staging via a minimally invasive approach to surgery.  We reviewed that robotic assistance would be used to complete the surgery.   We discussed that most endometrial cancer is detected early, however, we reviewed that adjuvant therapy will likely be recommended based on the patient's biopsy, however, we will defer to final pathology results.    Given her high risk histology, I recommend CT scan preoperatively to rule out metastatic disease. This is already scheduled for 9/23.  We reviewed the sentinel lymph node technique. Risks and benefits of sentinel lymph node biopsy was reviewed. We reviewed the technique and ICG dye. The patient DOES NOT have an iodine allergy or known liver dysfunction. We reviewed the false negative rate (0.4%), and that 3% of patients with metastatic disease will not have it detected by SLN biopsy in endometrial cancer. A low risk of allergic reaction to the dye, <0.2% for ICG, has been reported. We also discussed that in the case of failed mapping, which occurs 40% of the time, a bilateral or unilateral lymphadenectomy will be performed at the surgeon's discretion.   Potential benefits of sentinel nodes including a higher detection rate for metastasis due to ultrastaging and potential reduction in operative morbidity. However, there remains uncertainty as to the role for treatment of micrometastatic disease. Further, the benefit of operative  morbidity associated with the SLN technique in endometrial cancer is not yet completely known. In other patient populations (e.g. the cervical cancer population) there has been observed reductions in morbidity with SLN biopsy compared to pelvic lymphadenectomy. Lymphedema, nerve dysfunction and lymphocysts are all potential risks with the SLN technique as with complete lymphadenectomy. Additional risks to the patient include the risk of damage to an internal organ while operating in an altered view (e.g. the black and white image of the robotic fluorescence imaging mode).   We discussed the plan for a robotic assisted hysterectomy, bilateral salpingo-oophorectomy, sentinel lymph node evaluation, possible lymph node dissection, omentectomy, possible laparotomy. The risks of surgery were discussed in detail and she understands these to include infection; wound separation; hernia; vaginal cuff separation, injury to adjacent organs such as bowel, bladder, blood vessels, ureters and nerves; bleeding which may require blood transfusion (she refuses); anesthesia risk; thromboembolic events; possible death; unforeseen complications; possible need for re-exploration; medical complications such as heart attack, stroke, pleural effusion and pneumonia; and, if full lymphadenectomy is performed the risk of lymphedema and lymphocyst. The patient will receive DVT and antibiotic prophylaxis as indicated. She voiced a clear understanding. She had the opportunity to ask questions. Perioperative instructions were reviewed with her. Prescriptions for post-op medications were sent to her pharmacy of choice.  We discussed the importance of good glycemic control around the time of surgery to decrease the risk of postoperative infection, improve wound healing, and decrease the risk of cardiac events.  She is aware of what foods to limit to keep her blood sugars under 200.  Patient is a Scientist, product/process development.  She refuses blood  transfusion.  My office will fill out with her refusal of  blood product paperwork today.  Given stage III pelvic organ prolapse and urinary incontinence, referral had been made to urogynecology.  She is scheduled to see Dr. Guadlupe next week.  We will coordinate a joint day for surgery.  A copy of this note was sent to the patient's referring provider.   75 minutes of total time was spent for this patient encounter, including preparation, face-to-face counseling with the patient and coordination of care, and documentation of the encounter.  Comer Dollar, MD  Division of Gynecologic Oncology  Department of Obstetrics and Gynecology  [facility]  ___________________________________________  Chief Complaint: No chief complaint on file.   History of Present Illness:  Bridget Butler is a 78 y.o. y.o. female who is seen in consultation at the request of Dr. Nikki for an evaluation of endometrial cancer.  The patient was initially seen in April with a history of 1 year of intermittent vaginal bleeding. She was also noted to have mixed urinary incontinence and pelvic prolapse at that visit. Pelvic ultrasound in May 2025 showed a uterus measuring 9.9 x 7.1 x 7.8 cm with an endometrial lining of 57 mm, fluid filled with multiple vascular masses.  Initial plan for hysteroscopy with D&C and polypectomy. Given delay in surgical clearance, EMB was ultimately performed on 06/29/24. Pathology revealed serous carcinoma, p53 abnormal, MMRp.  Her last POC Hgb A1c was 7.9% in July.  The patient comes in with her daughter today.  She endorses pink or brown spotting that would happen on average once a week starting in May.  She had some bright red bleeding after her recent endometrial biopsy.  She denies any associated pain or cramping.  She began Ozempic  for her diabetes in April, which has caused decrease in appetite.  She has lost about 4 pounds.  Denies nausea or emesis. She  endorses intermittent constipation at baseline, no recent changes.  Since May, she feels a bulge within her vagina that she has to push in to urinate.   Patient is a Scientist, product/process development.  In terms of her diabetes, she wears a continuous meter, CBGs ranged between 130 and 250.  Cereal tends to be what causes her to go up to 250.  Still takes long-acting insulin  although has decreased her dose to 16 units in the morning.  PAST MEDICAL HISTORY:  Past Medical History:  Diagnosis Date   Cataract    had surgery   Diabetes mellitus without complication (HCC)    GERD (gastroesophageal reflux disease)    Hyperlipidemia    Hypertension    Multiple thyroid  nodules    Thyroid  disease      PAST SURGICAL HISTORY:  Past Surgical History:  Procedure Laterality Date   CHOLECYSTECTOMY     COSMETIC SURGERY Bilateral    eyelids   EYE SURGERY     GALLBLADDER SURGERY     MICROLARYNGOSCOPY WITH CO2 LASER AND EXCISION OF VOCAL CORD LESION N/A 04/02/2020   Procedure: MICROLARYNGOSCOPY WITH DILATION AND CO2 LASER AND EXCISION OF VOCAL CORD LESION w/MITOMYCIN  C;  Surgeon: Carlie Clark, MD;  Location: [facility] OR;  Service: ENT;  Laterality: N/A;   MICROLARYNGOSCOPY WITH DILATION N/A 05/23/2020   Procedure: MICROLARYNGOSCOPY W/ DILATION AND JET VENTILATION;  Surgeon: Carlie Clark, MD;  Location: [facility] OR;  Service: ENT;  Laterality: N/A;   MICROLARYNGOSCOPY WITH DILATION N/A 07/13/2020   Procedure: MICROLARYNGOSCOPY with Jet Ventilation;  Surgeon: Carlie Clark, MD;  Location: [facility] OR;  Service: ENT;  Laterality: N/A;  TRACHEOSTOMY TUBE PLACEMENT N/A 04/02/2020   Procedure: Awake TRACHEOSTOMY;  Surgeon: Carlie Clark, MD;  Location[facility] OR;  Service: ENT;  Laterality: N/A;   TUBAL LIGATION      OB/GYN HISTORY:  OB History  Gravida Para Term Preterm AB Living  3 3 3   3   SAB IAB Ectopic Multiple Live Births      2    # Outcome Date GA Lbr Len/2nd Weight Sex Type Anes PTL Lv  3 Term      Vag-Spont   LIV  2 Term       Vag-Spont   LIV  1 Term      Vag-Spont       No LMP recorded. Patient is postmenopausal.  Age at menarche: 85  Age at menopause: 23 Hx of HRT: denies Hx of STDs: denies History of abnormal pap smears: denies  SCREENING STUDIES:  Last mammogram: 2024  Last colonoscopy: 2022 Last bone mineral density: 2024  MEDICATIONS: Outpatient Encounter Medications as of 07/08/2024  Medication Sig   alendronate  (FOSAMAX ) 70 MG tablet Take 1 tablet (70 mg total) by mouth once a week. Take with a full glass of water on an empty stomach.   amLODipine  (NORVASC ) 5 MG tablet Take 1 tablet (5 mg total) by mouth daily.   ASPIRIN  CHILDRENS PO Take by mouth daily.   Blood Glucose Monitoring Suppl (TRUE METRIX METER) DEVI 1 Device by Does not apply route 2 (two) times daily.   calcium  carbonate (OSCAL) 1500 (600 Ca) MG TABS tablet Take 600 mg of elemental calcium  by mouth daily with breakfast.   Cholecalciferol  50 MCG (2000 UT) TABS Take 1 tablet (2,000 Units total) by mouth daily.   CINNAMON PO Take by mouth.   Continuous Glucose Sensor (FREESTYLE LIBRE 3 PLUS SENSOR) MISC 1 each by Does not apply route every 14 (fourteen) days.   FIBER PO Take by mouth.   glucose blood (TRUE METRIX BLOOD GLUCOSE TEST) test strip 1 each by Other route 2 (two) times daily. USE AS INSTRUCTED TO CHECK BLOOD SUGAR ONE TIME DAILY   Insulin  Degludec (TRESIBA [place]) Inject 16 Units into the skin daily.   Insulin  Pen Needle 32G X 6 MM MISC 1 Act by Does not apply route once a week.   Multiple Vitamin (MULTIVITAMIN WITH MINERALS) TABS tablet Take 1 tablet by mouth daily.   olmesartan  (BENICAR ) 20 MG tablet Take 1 tablet (20 mg total) by mouth daily.   omeprazole  (PRILOSEC) 40 MG capsule TAKE 1 CAPSULE BY MOUTH TWICE DAILY IN THE MORNING AND AT BEDTIME   potassium chloride  (KLOR-CON  10) 10 MEQ tablet Take 1 tablet (10 mEq total) by mouth 2 (two) times daily.   pravastatin  (PRAVACHOL ) 40 MG tablet Take 1 tablet (40 mg total) by mouth  at bedtime.   Semaglutide ,0.25 or 0.5MG /DOS, (OZEMPIC , 0.25 OR 0.5 MG/DOSE,) 2 MG/3ML SOPN Inject 0.5mg  into skin weekly   senna-docusate (SENOKOT-S) 8.6-50 MG tablet Take 2 tablets by mouth at bedtime. For AFTER surgery, do not take if having diarrhea   traMADol  (ULTRAM ) 50 MG tablet Take 1 tablet (50 mg total) by mouth every 6 (six) hours as needed for moderate pain (pain score 4-6). For AFTER surgery only, do not take and drive   insulin  glargine (LANTUS  SOLOSTAR) 100 UNIT/ML Solostar Pen Inject 24 Units into the skin daily. (Patient not taking: Reported on 07/06/2024)   [DISCONTINUED] Semaglutide  (OZEMPIC , 2 MG/DOSE, [place]) Inject 0.5 mg into the skin once a week. Getting through  PAP   No facility-administered encounter medications on file as of 07/08/2024.    ALLERGIES:  Allergies  Allergen Reactions   Metformin  And Related Diarrhea     FAMILY HISTORY:  Family History  Problem Relation Age of Onset   Heart disease Mother    Hypertension Mother    Cancer Sister        Uterine Cancer?   Diabetes Sister    Breast cancer Maternal Aunt    Breast cancer Maternal Aunt    Breast cancer Cousin    Prostate cancer Neg Hx    Ovarian cancer Neg Hx    Pancreatic cancer Neg Hx    Colon cancer Neg Hx      SOCIAL HISTORY:  Social Connections: Moderately Integrated (09/04/2023)   Social Connection and Isolation Panel    Frequency of Communication with Friends and Family: More than three times a week    Frequency of Social Gatherings with Friends and Family: More than three times a week    Attends Religious Services: More than 4 times per year    Active Member of [employer] or Organizations: Yes    Attends Banker Meetings: 1 to 4 times per year    Marital Status: Widowed    REVIEW OF SYSTEMS:  + diarrhea, blood in urine, pelvic pain, incontinence, muscle pain/cramps, vaginal discharge Denies appetite changes, fevers, chills, fatigue, unexplained weight changes. Denies hearing  loss, neck lumps or masses, mouth sores, ringing in ears or voice changes. Denies cough or wheezing.  Denies shortness of breath. Denies chest pain or palpitations. Denies leg swelling. Denies abdominal distention, pain, blood in stools, constipation, nausea, vomiting, or early satiety. Denies pain with intercourse, dysuria, frequency, or incontinence. Denies hot flashes.   Denies joint pain. Denies itching, rash, or wounds. Denies dizziness, headaches, numbness or seizures. Denies swollen lymph nodes or glands, denies easy bruising or bleeding. Denies anxiety, depression, confusion, or decreased concentration.  Physical Exam:  Vital Signs for this encounter:  Blood pressure (!) 144/58, pulse 83, temperature 98.5 F (36.9 C), temperature source Oral, resp. rate 19, height 5' 3 (1.6 m), weight 183 lb 9.6 oz (83.3 kg), SpO2 100%. Body mass index is 32.52 kg/m. General: Alert, oriented, no acute distress.  HEENT: Normocephalic, atraumatic. Sclera anicteric.  Chest: Clear to auscultation bilaterally. No wheezes, rhonchi, or rales. Cardiovascular: Regular rate and rhythm, no murmurs, rubs, or gallops.  Abdomen: Normoactive bowel sounds. Soft, nondistended, nontender to palpation. No masses or hepatosplenomegaly appreciated. No palpable fluid wave.  Extremities: Grossly normal range of motion. Warm, well perfused. No edema bilaterally.  Skin: No rashes or lesions.  Lymphatics: No cervical, supraclavicular, or inguinal adenopathy.  GU:  Normal external female genitalia. No lesions. No discharge or bleeding.             Vagina: Mildly atrophic.             Cervix: Normal appearing, no lesions.  Cervix past the level of the introitus.             Uterus: mobile, somewhat bulbous LUS, no parametrial involvement or nodularity.             Adnexa: No masses appreciated.  Rectal: Deferred.  LABORATORY AND RADIOLOGIC DATA:  Outside medical records were reviewed to synthesize the above history,  along with the history and physical obtained during the visit.   Lab Results  Component Value Date   WBC 5.9 01/04/2024   HGB 11.2 (L) 01/04/2024  HCT 34.9 (L) 01/04/2024   PLT 326.0 01/04/2024   GLUCOSE 207 (H) 01/04/2024   CHOL 207 (H) 07/28/2023   TRIG 178.0 (H) 07/28/2023   HDL 72.30 07/28/2023   LDLCALC 99 07/28/2023   ALT 20 07/28/2023   AST 17 07/28/2023   NA 136 01/04/2024   K 4.1 01/04/2024   CL 101 01/04/2024   CREATININE 1.05 01/04/2024   BUN 12 01/04/2024   CO2 28 01/04/2024   TSH 0.36 01/04/2024   HGBA1C 7.9 (A) 04/27/2024   MICROALBUR 29.8 04/27/2024

## 2024-07-07 NOTE — Telephone Encounter (Signed)
 Called patient to answer any remaining questions since my leave. Reviewed appt with Dr. Viktoria for tomorrow. Patient overall doing well- no further questions. Offered our assistance during the process.

## 2024-07-08 ENCOUNTER — Inpatient Hospital Stay: Attending: Gynecologic Oncology | Admitting: Gynecologic Oncology

## 2024-07-08 ENCOUNTER — Encounter: Payer: Self-pay | Admitting: Gynecologic Oncology

## 2024-07-08 ENCOUNTER — Inpatient Hospital Stay

## 2024-07-08 ENCOUNTER — Inpatient Hospital Stay: Admitting: Gynecologic Oncology

## 2024-07-08 VITALS — BP 144/58 | HR 83 | Temp 98.5°F | Resp 19 | Ht 63.0 in | Wt 183.6 lb

## 2024-07-08 DIAGNOSIS — C541 Malignant neoplasm of endometrium: Secondary | ICD-10-CM | POA: Diagnosis not present

## 2024-07-08 DIAGNOSIS — Z7983 Long term (current) use of bisphosphonates: Secondary | ICD-10-CM | POA: Diagnosis not present

## 2024-07-08 DIAGNOSIS — Z79899 Other long term (current) drug therapy: Secondary | ICD-10-CM | POA: Diagnosis not present

## 2024-07-08 DIAGNOSIS — K219 Gastro-esophageal reflux disease without esophagitis: Secondary | ICD-10-CM | POA: Insufficient documentation

## 2024-07-08 DIAGNOSIS — E1122 Type 2 diabetes mellitus with diabetic chronic kidney disease: Secondary | ICD-10-CM

## 2024-07-08 DIAGNOSIS — N950 Postmenopausal bleeding: Secondary | ICD-10-CM | POA: Diagnosis not present

## 2024-07-08 DIAGNOSIS — N3946 Mixed incontinence: Secondary | ICD-10-CM | POA: Diagnosis not present

## 2024-07-08 DIAGNOSIS — I10 Essential (primary) hypertension: Secondary | ICD-10-CM | POA: Diagnosis not present

## 2024-07-08 DIAGNOSIS — Z803 Family history of malignant neoplasm of breast: Secondary | ICD-10-CM | POA: Diagnosis not present

## 2024-07-08 DIAGNOSIS — Z7985 Long-term (current) use of injectable non-insulin antidiabetic drugs: Secondary | ICD-10-CM | POA: Diagnosis not present

## 2024-07-08 DIAGNOSIS — Z794 Long term (current) use of insulin: Secondary | ICD-10-CM | POA: Diagnosis not present

## 2024-07-08 DIAGNOSIS — Z7982 Long term (current) use of aspirin: Secondary | ICD-10-CM | POA: Insufficient documentation

## 2024-07-08 DIAGNOSIS — E119 Type 2 diabetes mellitus without complications: Secondary | ICD-10-CM | POA: Diagnosis not present

## 2024-07-08 DIAGNOSIS — N819 Female genital prolapse, unspecified: Secondary | ICD-10-CM | POA: Insufficient documentation

## 2024-07-08 DIAGNOSIS — E785 Hyperlipidemia, unspecified: Secondary | ICD-10-CM | POA: Diagnosis not present

## 2024-07-08 DIAGNOSIS — Z8049 Family history of malignant neoplasm of other genital organs: Secondary | ICD-10-CM | POA: Diagnosis not present

## 2024-07-08 DIAGNOSIS — N1832 Chronic kidney disease, stage 3b: Secondary | ICD-10-CM

## 2024-07-08 DIAGNOSIS — E039 Hypothyroidism, unspecified: Secondary | ICD-10-CM | POA: Diagnosis not present

## 2024-07-08 LAB — HEMOGLOBIN A1C
Hgb A1c MFr Bld: 7 % — ABNORMAL HIGH (ref 4.8–5.6)
Mean Plasma Glucose: 154.2 mg/dL

## 2024-07-08 MED ORDER — SENNOSIDES-DOCUSATE SODIUM 8.6-50 MG PO TABS: 2.0000 | ORAL_TABLET | Freq: Every day | ORAL | 0 refills | Status: DC

## 2024-07-08 MED ORDER — TRAMADOL HCL 50 MG PO TABS: 50.0000 mg | ORAL_TABLET | Freq: Four times a day (QID) | ORAL | 0 refills | Status: DC | PRN

## 2024-07-08 NOTE — Patient Instructions (Addendum)
 -Plan on having a CA 125 level and hemoglobin A1C today. You will be contacted with the results.   -Continue with plan to meet with Dr. Guadlupe, the urogynecologist, on July 15, 2024. We will work with your office to schedule the surgery date. We are going to tentatively hold time on July 20, 2024 but this may need to change based on both surgeons availability.   -Also plan on having a CT scan prior to surgery. Nothing to eat or drink 4 hours before your scan. Plan to arrive 2 hours before your CT scan to begin drinking oral contrast.  Preparing for your Surgery  Plan for surgery on July 20, 2024 (tentative, may have to adjust) with Dr. Comer Dollar at [facility]. You will be scheduled for robotic assisted total laparoscopic hysterectomy (removal of the uterus and cervix), bilateral salpingo-oophorectomy (removal of both ovaries and fallopian tubes), sentinel lymph node biopsy, possible lymph node dissection, possible laparotomy (larger incision on your abdomen if needed), omentectomy (biopsy of the omentum).    Pre-operative Testing -You will receive a phone call from presurgical testing at [facility] to arrange for a pre-operative appointment and lab work.  -Bring your insurance card, copy of an advanced directive if applicable, medication list  -You should not be taking blood thinners or aspirin  at least ten days prior to surgery unless instructed by your surgeon.  -Do not take supplements such as fish oil (omega 3), red yeast rice, turmeric before your surgery. STOP TAKING AT LEAST 10 DAYS BEFORE SURGERY. You want to avoid medications with aspirin  in them including headache powders such as BC or Goody's), Excedrin migraine.  -If you are taking a GLP-1 medication/injection such as Ozempic , Mounjaro, [id number], this needs to be held before surgery for at least 7 days before.  Day Before Surgery at Home -You will be asked to take in a light diet the day before  surgery. You will be advised you can have clear liquids up until 3 hours before your surgery.    Eat a light diet the day before surgery.  Examples including soups, broths, toast, yogurt, mashed potatoes.  AVOID GAS PRODUCING FOODS AND BEVERAGES. Things to avoid include carbonated beverages (fizzy beverages, sodas), raw fruits and raw vegetables (uncooked), or beans.   If your bowels are filled with gas, your surgeon will have difficulty visualizing your pelvic organs which increases your surgical risks.  Your role in recovery Your role is to become active as soon as directed by your doctor, while still giving yourself time to heal.  Rest when you feel tired. You will be asked to do the following in order to speed your recovery:  - Cough and breathe deeply. This helps to clear and expand your lungs and can prevent pneumonia after surgery.  - STAY ACTIVE WHEN YOU GET HOME. Do mild physical activity. Walking or moving your legs help your circulation and body functions return to normal. Do not try to get up or walk alone the first time after surgery.   -If you develop swelling on one leg or the other, pain in the back of your leg, redness/warmth in one of your legs, please call the office or go to the Emergency Room to have a doppler to rule out a blood clot. For shortness of breath, chest pain-seek care in the Emergency Room as soon as possible. - Actively manage your pain. Managing your pain lets you move in comfort. We will ask you to rate your pain  on a scale of zero to 10. It is your responsibility to tell your doctor or nurse where and how much you hurt so your pain can be treated.  Special Considerations -If you are diabetic, you may be placed on insulin  after surgery to have closer control over your blood sugars to promote healing and recovery.  This does not mean that you will be discharged on insulin .  If applicable, your oral antidiabetics will be resumed when you are tolerating a solid  diet.  -Your final pathology results from surgery should be available around one week after surgery and the results will be relayed to you when available.  -FMLA forms can be faxed to [phone] and please allow 5-7 business days for completion.  Pain Management After Surgery -You will be prescribed your pain medication and bowel regimen medications before surgery so that you can have these available when you are discharged from the hospital. The pain medication is for use ONLY AFTER surgery and a new prescription will not be given.   -Make sure that you have Tylenol  and Ibuprofen IF YOU ARE ABLE TO TAKE THESE MEDICATIONS at home to use on a regular basis after surgery for pain control. We recommend alternating the medications every hour to six hours since they work differently and are processed in the body differently for pain relief.  -Review the attached handout on narcotic use and their risks and side effects.   Bowel Regimen -You will be prescribed Sennakot-S to take nightly to prevent constipation especially if you are taking the narcotic pain medication intermittently.  It is important to prevent constipation and drink adequate amounts of liquids. You can stop taking this medication when you are not taking pain medication and you are back on your normal bowel routine.  Risks of Surgery Risks of surgery are low but include bleeding, infection, damage to surrounding structures, re-operation, blood clots, and very rarely death.  AFTER SURGERY INSTRUCTIONS  Return to work: 4-6 weeks if applicable  Activity: 1. Be up and out of the bed during the day.  Take a nap if needed.  You may walk up steps but be careful and use the hand rail.  Stair climbing will tire you more than you think, you may need to stop part way and rest.   2. No lifting or straining for 6 weeks over 10 pounds. No pushing, pulling, straining for 6 weeks.  3. No driving for 4-89 days when the following criteria have  been met: Do not drive if you are taking narcotic pain medicine and make sure that your reaction time has returned.   4. You can shower as soon as the next day after surgery. Shower daily.  Use your regular soap and water (not directly on the incision) and pat your incision(s) dry afterwards; don't rub.  No tub baths or submerging your body in water until cleared by your surgeon. If you have the soap that was given to you by pre-surgical testing that was used before surgery, you do not need to use it afterwards because this can irritate your incisions.   5. No sexual activity and nothing in the vagina for 12 weeks.  6. You may experience a small amount of clear drainage from your incisions, which is normal.  If the drainage persists, increases, or changes color please call the office.  7. Do not use creams, lotions, or ointments such as neosporin on your incisions after surgery until advised by your surgeon because they can cause  removal of the dermabond glue on your incisions.    8. You may experience vaginal spotting after surgery or when the stitches at the top of the vagina begin to dissolve.  The spotting is normal but if you experience heavy bleeding, call our office.  9. Take Tylenol  or ibuprofen first for pain if you are able to take these medications and only use narcotic pain medication for severe pain not relieved by the Tylenol  or Ibuprofen.  Monitor your Tylenol  intake to a max of 4,000 mg in a 24 hour period. You can alternate these medications after surgery.  Diet: 1. Low sodium Heart Healthy Diet is recommended but you are cleared to resume your normal (before surgery) diet after your procedure.  2. It is safe to use a laxative, such as Miralax  or Colace, if you have difficulty moving your bowels before surgery. You have been prescribed Sennakot-S to take at bedtime every evening after surgery to keep bowel movements regular and to prevent constipation.    Wound Care: 1. Keep  clean and dry.  Shower daily.  Reasons to call the Doctor: Fever - Oral temperature greater than 100.4 degrees Fahrenheit Foul-smelling vaginal discharge Difficulty urinating Nausea and vomiting Increased pain at the site of the incision that is unrelieved with pain medicine. Difficulty breathing with or without chest pain New calf pain especially if only on one side Sudden, continuing increased vaginal bleeding with or without clots.   Contacts: For questions or concerns you should contact:  Dr. Comer Dollar at [phone]  [name], NP at [phone]  After Hours: call [phone] and have the GYN Oncologist paged/contacted (after 5 pm or on the weekends). You will speak with an after hours RN and let he or she know you have had surgery.  Messages sent via mychart are for non-urgent matters and are not responded to after hours so for urgent needs, please call the after hours number.

## 2024-07-08 NOTE — Progress Notes (Signed)
 Patient here for a consult with Dr. Viktoria and for a pre-operative appointment prior to her scheduled surgery on 07/20/2024. She is scheduled for a robotic assisted total laparoscopic hysterectomy, bilateral salpingo-oophorectomy, sentinel lymph node biopsy, possible lymph node dissection, possible laparotomy, omentectomy. The surgery was discussed in detail.  See after visit summary for additional details.     Discussed post-op pain management in detail including the aspects of the enhanced recovery pathway.  Advised her that a new prescription would be sent in for Tramadol  and it is only to be used for after her upcoming surgery.  We discussed the use of tylenol  post-op and to monitor for a maximum of 4,000 mg in a 24 hour period.  Also prescribed sennakot to be used after surgery and to hold if having loose stools.  Discussed bowel regimen in detail.     Discussed the use of SCDs and measures to take at home to prevent DVT including frequent mobility.  Reportable signs and symptoms of DVT discussed. Post-operative instructions discussed and expectations for after surgery. Incisional care discussed as well including reportable signs and symptoms including erythema, drainage, wound separation.     30 minutes spent with the patient.  Verbalizing understanding of material discussed. No needs or concerns voiced at the end of the visit.   Advised patient to call for any needs.  Advised that her post-operative medications had been prescribed and could be picked up at any time.    This appointment is included in the global surgical bundle as pre-operative teaching and has no charge.

## 2024-07-09 LAB — CA 125: Cancer Antigen (CA) 125: 81.6 U/mL — ABNORMAL HIGH (ref 0.0–38.1)

## 2024-07-11 ENCOUNTER — Ambulatory Visit: Payer: Self-pay | Admitting: *Deleted

## 2024-07-11 NOTE — Telephone Encounter (Signed)
 Attempted to reach patient to relay results. Left voicemail requesting call back.

## 2024-07-11 NOTE — Telephone Encounter (Signed)
-----   Message from Eleanor JONETTA Epps sent at 07/11/2024 10:03 AM EDT ----- Please let her know her Hgb A1C is at 7.0. Two months ago it was at 7.9. Good job at getting the value to improve. Would continue to watch intake, monitor sugars. Having higher glucose levels can  increase risk of infection, complications after surgery. ----- Message ----- From: Rebecka, [facility] [place] Sent: 07/08/2024   6:51 PM EDT To: Eleanor JONETTA Epps, NP

## 2024-07-11 NOTE — Telephone Encounter (Signed)
 Spoke with Bridget Butler and relayed message from [name], NP -please let patient know her Hgb A1C is a 7.0. Two months ago it was 7.9. Good job at getting the value to improve. Would continue to watch intake, monitor sugars. Having higher glucose levels can increase risk of infection, complications after surgery. Pt verbalized understanding and thanked the office for calling.

## 2024-07-12 ENCOUNTER — Ambulatory Visit (HOSPITAL_COMMUNITY)
Admission: RE | Admit: 2024-07-12 | Discharge: 2024-07-12 | Disposition: A | Discharge status: Home / Self Care | Source: Ambulatory Visit | Attending: Gynecologic Oncology | Admitting: Gynecologic Oncology

## 2024-07-12 DIAGNOSIS — I700 Atherosclerosis of aorta: Secondary | ICD-10-CM | POA: Diagnosis not present

## 2024-07-12 DIAGNOSIS — C541 Malignant neoplasm of endometrium: Secondary | ICD-10-CM | POA: Insufficient documentation

## 2024-07-12 DIAGNOSIS — E042 Nontoxic multinodular goiter: Secondary | ICD-10-CM | POA: Diagnosis not present

## 2024-07-12 DIAGNOSIS — N3289 Other specified disorders of bladder: Secondary | ICD-10-CM | POA: Diagnosis not present

## 2024-07-12 MED ORDER — IOHEXOL 300 MG/ML  SOLN
100.0000 mL | Freq: Once | INTRAMUSCULAR | Status: AC | PRN
  Administered 2024-07-12: 100 mL via INTRAVENOUS

## 2024-07-12 MED ORDER — IOHEXOL 9 MG/ML PO SOLN
1000.0000 mL | ORAL | Status: AC
  Administered 2024-07-12: 1000 mL via ORAL

## 2024-07-12 NOTE — Telephone Encounter (Signed)
 Received endocrinology clearance

## 2024-07-14 ENCOUNTER — Other Ambulatory Visit: Payer: Self-pay | Admitting: Gynecologic Oncology

## 2024-07-14 ENCOUNTER — Telehealth: Payer: Self-pay | Admitting: *Deleted

## 2024-07-14 ENCOUNTER — Inpatient Hospital Stay: Admitting: Gynecologic Oncology

## 2024-07-14 ENCOUNTER — Ambulatory Visit: Payer: Self-pay | Admitting: Gynecologic Oncology

## 2024-07-14 VITALS — BP 140/64 | HR 84 | Temp 98.6°F | Resp 20 | Wt 179.8 lb

## 2024-07-14 DIAGNOSIS — E119 Type 2 diabetes mellitus without complications: Secondary | ICD-10-CM | POA: Diagnosis not present

## 2024-07-14 DIAGNOSIS — K219 Gastro-esophageal reflux disease without esophagitis: Secondary | ICD-10-CM | POA: Diagnosis not present

## 2024-07-14 DIAGNOSIS — Z79899 Other long term (current) drug therapy: Secondary | ICD-10-CM | POA: Diagnosis not present

## 2024-07-14 DIAGNOSIS — R339 Retention of urine, unspecified: Secondary | ICD-10-CM

## 2024-07-14 DIAGNOSIS — Z7982 Long term (current) use of aspirin: Secondary | ICD-10-CM | POA: Diagnosis not present

## 2024-07-14 DIAGNOSIS — C541 Malignant neoplasm of endometrium: Secondary | ICD-10-CM | POA: Diagnosis not present

## 2024-07-14 DIAGNOSIS — Z7985 Long-term (current) use of injectable non-insulin antidiabetic drugs: Secondary | ICD-10-CM | POA: Diagnosis not present

## 2024-07-14 DIAGNOSIS — E785 Hyperlipidemia, unspecified: Secondary | ICD-10-CM | POA: Diagnosis not present

## 2024-07-14 DIAGNOSIS — I10 Essential (primary) hypertension: Secondary | ICD-10-CM | POA: Diagnosis not present

## 2024-07-14 DIAGNOSIS — Z7983 Long term (current) use of bisphosphonates: Secondary | ICD-10-CM | POA: Diagnosis not present

## 2024-07-14 DIAGNOSIS — Z794 Long term (current) use of insulin: Secondary | ICD-10-CM | POA: Diagnosis not present

## 2024-07-14 DIAGNOSIS — Z803 Family history of malignant neoplasm of breast: Secondary | ICD-10-CM | POA: Diagnosis not present

## 2024-07-14 DIAGNOSIS — E039 Hypothyroidism, unspecified: Secondary | ICD-10-CM | POA: Diagnosis not present

## 2024-07-14 LAB — URINALYSIS, COMPLETE (UACMP) WITH MICROSCOPIC
Bacteria, UA: NONE SEEN
Bilirubin Urine: NEGATIVE
Glucose, UA: 50 mg/dL — AB
Hgb urine dipstick: NEGATIVE
Ketones, ur: NEGATIVE mg/dL
Leukocytes,Ua: NEGATIVE
Nitrite: NEGATIVE
Protein, ur: NEGATIVE mg/dL
Specific Gravity, Urine: 1.012 (ref 1.005–1.030)
pH: 5 (ref 5.0–8.0)

## 2024-07-14 NOTE — Telephone Encounter (Signed)
-----   Message from Eleanor JONETTA Epps sent at 07/14/2024 11:39 AM EDT ----- Please reach out to the patient and let her know on her recent CT scan, it is showing significant urinary retention. Her bladder was significantly distended. Is she voiding? Large amounts? Dr. Viktoria wants her to come in today to have a foley placed to stay in place until her evaluation with Dr. Guadlupe with Urogyn tomorrow.  She can go on Amelia Macken's schedule to have the foley placed if you feel comfortable with this.

## 2024-07-14 NOTE — Patient Instructions (Signed)
 Plan on emptying the catheter bag when it becomes around halfway full or less.   Keep the bag lower than your bladder since this flows via gravity.  Plan to follow up with Dr. Guadlupe tomorrow.  We will send the urine sample to test for infection.

## 2024-07-14 NOTE — Telephone Encounter (Signed)
 Spoke with [name] and relayed message from [name], NP that on patient's recent CT scan it is showing significant urinary retention. Bladder was significantly distended.   Patient states due to her prolapse she has to use her finger to move it out of the way to allow space for her to empty her bladder. When she does this, she empty's a full amount each time.   Advised patient that Dr. Viktoria would like patient to have an office visit today to have a foley placed to stay in until her evaluation with Dr. Guadlupe with Urogyn tomorrow.   Patient agreed to appointment today and was scheduled for 3:15 pm. Pt states she has to take her son to an appointment for 2 pm first, but then she will be able to make if for 3:15 pm.

## 2024-07-14 NOTE — Progress Notes (Unsigned)
 New Patient Evaluation and Consultation  Referring Provider: Micheline Eleanor BIRCH, NP PCP: Joshua Debby CROME, MD Date of Service: 07/15/2024  SUBJECTIVE Chief Complaint: No chief complaint on file.  History of Present Illness: Bridget Butler is a 78 y.o. Black or African-American female seen in consultation at the request of NP Cross for evaluation of stage III pelvic organ prolapse with endometrial cancer.    Pending RATLH, BSO, omentectomy, sentinel lymph node dissection 07/20/24 by Dr. Viktoria Bladder distention noted on TVUS 01/2023  ***Review of records significant for: ***T2DM with HbA1C 7 in 07/08/24, Stage IIIb CKD with Cr 1.05 in 01/04/24  CT chest, abdomen, pelvis with contrast CLINICAL DATA:  Endometrial cancer staging, high-grade serous endometrial cancer * Tracking Code: BO *   EXAM: CT CHEST, ABDOMEN, AND PELVIS WITH CONTRAST   TECHNIQUE: Multidetector CT imaging of the chest, abdomen and pelvis was performed following the standard protocol during bolus administration of intravenous contrast.   RADIATION DOSE REDUCTION: This exam was performed according to the departmental dose-optimization program which includes automated exposure control, adjustment of the mA and/or kV according to patient size and/or use of iterative reconstruction technique.   CONTRAST:  OMNIPAQUE  IOHEXOL  300 MG/ML  SOLN   COMPARISON:  CT abdomen pelvis, 01/28/2023, CT chest, 01/04/2020   FINDINGS: CT CHEST FINDINGS   Cardiovascular: Aortic atherosclerosis. Normal heart size. Three-vessel coronary artery calcifications. No pericardial effusion.   Mediastinum/Nodes: No enlarged mediastinal, hilar, or axillary lymph nodes. Calcified thyroid  nodules, requiring no specific further follow-up or characterization in the setting of other known primary malignancy. Trachea and esophagus demonstrate no significant findings.   Lungs/Pleura: Lungs are clear. No pleural effusion or pneumothorax.    Musculoskeletal: No chest wall abnormality. No acute osseous findings.   CT ABDOMEN PELVIS FINDINGS   Hepatobiliary: No focal liver abnormality is seen. Status post cholecystectomy. No biliary dilatation.   Pancreas: Unremarkable. No pancreatic ductal dilatation or surrounding inflammatory changes.   Spleen: Normal in size without significant abnormality.   Adrenals/Urinary Tract: Adrenal glands are unremarkable. Kidneys are normal, without renal calculi, solid lesion, or hydronephrosis. Severely distended urinary bladder, maximum coronal span 21.7 cm (series 7, image 120).   Stomach/Bowel: Stomach is within normal limits. Appendix appears normal. No evidence of bowel wall thickening, distention, or inflammatory changes.   Vascular/Lymphatic: Aortic atherosclerosis. No enlarged abdominal or pelvic lymph nodes.   Reproductive: Large, heterogeneous mixed solid and cystic mass occupying the endometrial cavity and endocervical canal, measuring 9.6 x 5.8 x 5.7 cm (series 7, image 116, series 2, image 76).   Other: No abdominal wall hernia or abnormality. No ascites.   Musculoskeletal: No acute osseous findings.   IMPRESSION: 1. Large, heterogeneous mixed solid and cystic mass occupying the endometrial cavity and endocervical canal, measuring 9.6 x 5.8 x 5.7 cm, consistent with primary endometrial malignancy. 2. No evidence of lymphadenopathy or metastatic disease in the chest, abdomen, or pelvis. 3. Severely distended urinary bladder, maximum coronal span 21.7 cm similar to prior examination. Correlate for urinary retention. 4. Coronary artery disease.   Aortic Atherosclerosis (ICD10-I70.0).     Electronically Signed   By: Marolyn BIRCH Jaksch M.D.   On: 07/14/2024 09:38  Urinary Symptoms: {urine leakage?:24754} Leaks *** time(s) per {days/wks/mos/yrs:310907}.  Pad use: {NUMBERS 1-10:18281} {pad option:24752} per day.   Patient {ACTION; IS/IS WNU:78978602} bothered by UI  symptoms.  Day time voids ***.  Nocturia: *** times per night to void. Voiding dysfunction:  {empties:24755} bladder well.  Patient {DOES NOT does:27190::does  not} use a catheter to empty bladder.  When urinating, patient feels {urine symptoms:24756} Drinks: *** per day  UTIs: {NUMBERS 1-10:18281} UTI's in the last year.   {ACTIONS;DENIES/REPORTS:21021675::Denies} history of {urologic concerns:24757} No results found for the last 90 days.   Pelvic Organ Prolapse Symptoms:                  Patient {denies/ admits to:24761} a feeling of a bulge the vaginal area. It has been present for {NUMBER 1-10:22536} {days/wks/mos/yrs:310907}.  Patient {denies/ admits to:24761} seeing a bulge.  This bulge {ACTION; IS/IS WNU:78978602} bothersome.  Bowel Symptom: Bowel movements: *** time(s) per {Time; day/week/month:13537} Stool consistency: {stool consistency:24758} Straining: {yes/no:19897}.  Splinting: {yes/no:19897}.  Incomplete evacuation: {yes/no:19897}.  Patient {denies/ admits to:24761} accidental bowel leakage / fecal incontinence  Occurs: *** time(s) per {Time; day/week/month:13537}  Consistency with leakage: {stool consistency:24758} Bowel regimen: {bowel regimen:24759} Last colonoscopy: Date ***, Results *** [facility] Colonoscopy          Completed or No Longer Recommended     Colonoscopy  Discontinued      Frequency changed to Never automatically (Topic No Longer Applies)   04/10/2016  Outside Claim: PR COLONOSCOPY W/BIOPSY SINGLE/MULTIPLE   Only the first 1 history entries have been loaded, but more history exists.                Sexual Function Sexually active: {yes/no:19897}.  Sexual orientation: {Sexual [phone]} Pain with sex: {pain with sex:24762}  Pelvic Pain {denies/ admits to:24761} pelvic pain Location: *** Pain occurs: *** Prior pain treatment: *** Improved by: *** Worsened by: ***   Past Medical History:  Past Medical History:   Diagnosis Date   Cataract    had surgery   Diabetes mellitus without complication (HCC)    GERD (gastroesophageal reflux disease)    Hyperlipidemia    Hypertension    Multiple thyroid  nodules    Thyroid  disease      Past Surgical History:   Past Surgical History:  Procedure Laterality Date   CHOLECYSTECTOMY     COSMETIC SURGERY Bilateral    eyelids   EYE SURGERY     GALLBLADDER SURGERY     MICROLARYNGOSCOPY WITH CO2 LASER AND EXCISION OF VOCAL CORD LESION N/A 04/02/2020   Procedure: MICROLARYNGOSCOPY WITH DILATION AND CO2 LASER AND EXCISION OF VOCAL CORD LESION w/MITOMYCIN  C;  Surgeon: Carlie Clark, MD;  Location: [facility] OR;  Service: ENT;  Laterality: N/A;   MICROLARYNGOSCOPY WITH DILATION N/A 05/23/2020   Procedure: MICROLARYNGOSCOPY W/ DILATION AND JET VENTILATION;  Surgeon: Carlie Clark, MD;  Location: [facility] OR;  Service: ENT;  Laterality: N/A;   MICROLARYNGOSCOPY WITH DILATION N/A 07/13/2020   Procedure: MICROLARYNGOSCOPY with Jet Ventilation;  Surgeon: Carlie Clark, MD;  Location[facility] OR;  Service: ENT;  Laterality: N/A;   TRACHEOSTOMY TUBE PLACEMENT N/A 04/02/2020   Procedure: Awake TRACHEOSTOMY;  Surgeon: Carlie Clark, MD;  Location: [facility] OR;  Service: ENT;  Laterality: N/A;   TUBAL LIGATION       Past OB/GYN History: OB History  Gravida Para Term Preterm AB Living  3 3 3   3   SAB IAB Ectopic Multiple Live Births      2    # Outcome Date GA Lbr Len/2nd Weight Sex Type Anes PTL Lv  3 Term      Vag-Spont   LIV  2 Term      Vag-Spont   LIV  1 Term      Vag-Spont       Vaginal  deliveries: ***,  Forceps/ Vacuum deliveries: ***, Cesarean section: *** Menopausal: {menopausal:24763} Contraception: ***. Last pap smear was ***.  Any history of abnormal pap smears: {yes/no:19897}.    Component Value Date/Time   DIAGPAP - Benign reactive/reparative changes 02/04/2022 1158   ADEQPAP  02/04/2022 1158    Satisfactory for evaluation. The presence or absence of an   ADEQPAP   02/04/2022 1158    endocervical/transformation zone component cannot be determined because   ADEQPAP of atrophy. 02/04/2022 1158    Medications: Patient has a current medication list which includes the following prescription(s): amlodipine , aspirin , true metrix meter, calcium  carbonate, cinnamon, freestyle libre 3 plus sensor, fiber, insulin  degludec, insulin  pen needle, multivitamin with minerals, olmesartan , omeprazole , potassium chloride , pravastatin , ozempic  (0.25 or 0.5 mg/dose), senna-docusate, and tramadol .   Allergies: Patient is allergic to metformin  and related.   Social History:  Social History   Tobacco Use   Smoking status: Never   Smokeless tobacco: Never  Vaping Use   Vaping status: Never Used  Substance Use Topics   Alcohol use: No   Drug use: No    Relationship status: {relationship status:24764} Patient lives with ***.   Patient {ACTION; IS/IS WNU:78978602} employed ***. Regular exercise: {Yes/No:304960894} History of abuse: {Yes/No:304960894}  Family History:   Family History  Problem Relation Age of Onset   Heart disease Mother    Hypertension Mother    Cancer Sister        Uterine Cancer?   Diabetes Sister    Breast cancer Maternal Aunt    Breast cancer Maternal Aunt    Breast cancer Cousin    Prostate cancer Neg Hx    Ovarian cancer Neg Hx    Pancreatic cancer Neg Hx    Colon cancer Neg Hx      Review of Systems: ROS   OBJECTIVE Physical Exam: There were no vitals filed for this visit.  Physical Exam Constitutional:      General: She is not in acute distress.    Appearance: Normal appearance.  Genitourinary:     Bladder and urethral meatus normal.     No lesions in the vagina.     Right Labia: No rash, tenderness, lesions, skin changes or Bartholin's cyst.    Left Labia: No tenderness, lesions, skin changes, Bartholin's cyst or rash.    No vaginal discharge, erythema, tenderness, bleeding, ulceration or granulation tissue.       Right Adnexa: not tender, not full and no mass present.    Left Adnexa: not tender, not full and no mass present.    No cervical motion tenderness, discharge, friability, lesion, polyp or nabothian cyst.     Uterus is not enlarged, fixed, tender or irregular.     No uterine mass detected.    Urethral meatus caruncle not present.    No urethral prolapse, tenderness, mass, hypermobility or discharge present.     Bladder is not tender, urgency on palpation not present and masses not present.      Levator ani not tender, obturator internus not tender, no asymmetrical contractions present and no pelvic spasms present.    Anal wink present and BC reflex present. Cardiovascular:     Rate and Rhythm: Normal rate.  Pulmonary:     Effort: Pulmonary effort is normal. No respiratory distress.  Abdominal:     General: There is no distension.     Palpations: There is no mass.     Tenderness: There is no abdominal tenderness.     Hernia:  No hernia is present.  Neurological:     Mental Status: She is alert.  Vitals reviewed. Exam conducted with a chaperone present.      POP-Q:   POP-Q                                               Aa                                               Ba                                                 C                                                Gh                                               Pb                                               tvl                                                Ap                                               Bp                                                 D      Rectal Exam:  Normal sphincter tone, {rectocele:24766} distal rectocele, enterocoele {DESC; PRESENT/NOT PRESENT:21021351}, no rectal masses, {sign of:24767} dyssynergia when asking the patient to bear down.  Post-Void Residual (PVR) by Bladder Scan: In order to evaluate bladder emptying, we discussed obtaining a postvoid residual and patient agreed to  this procedure.  Procedure: The ultrasound unit was placed on the patient's abdomen in the suprapubic region after the patient had voided.      Laboratory Results: Lab Results  Component Value Date   COLORU yellow 04/25/2020   CLARITYU cloudy (A) 04/25/2020   GLUCOSEUR >=1,000 (A) 04/25/2020   BILIRUBINUR NEGATIVE 07/28/2023   SPECGRAV 1.020 04/25/2020   RBCUR moderate (A) 04/25/2020   PHUR 6.0 04/25/2020   PROTEINUR =100 (A) 04/25/2020   UROBILINOGEN 0.2  07/28/2023   LEUKOCYTESUR TRACE (A) 07/28/2023    Lab Results  Component Value Date   CREATININE 1.05 01/04/2024   CREATININE 1.14 07/28/2023   CREATININE 1.10 02/18/2023    Lab Results  Component Value Date   HGBA1C 7.0 (H) 07/08/2024    Lab Results  Component Value Date   HGB 11.2 (L) 01/04/2024     ASSESSMENT AND PLAN Ms. Boisselle is a 78 y.o. with: No diagnosis found.  There are no diagnoses linked to this encounter.   Lianne ONEIDA Gillis, MD

## 2024-07-14 NOTE — Telephone Encounter (Signed)
 Per Dr Viktoria appt moved from 10/8 to 10/9. Patient aware

## 2024-07-14 NOTE — Progress Notes (Signed)
 Gynecologic Oncology Symptom Management  Bridget Butler is a 78 year old female with newly diagnosed uterine serous carcinoma. She is scheduled for surgery on 07/20/2024. As part of her preoperative workup/evaluation for metastatic disease, she underwent a CT scan of the abdomen and pelvis on 07/12/2024. Results revealed: 1. Large, heterogeneous mixed solid and cystic mass occupying the endometrial cavity and endocervical canal, measuring 9.6 x 5.8 x 5.7 cm, consistent with primary endometrial malignancy. 2. No evidence of lymphadenopathy or metastatic disease in the chest, abdomen, or pelvis. 3. Severely distended urinary bladder, maximum coronal span 21.7 cm similar to prior examination. Correlate for urinary retention. 4. Coronary artery disease. 5. Aortic Atherosclerosis   Given severely distended urinary bladder on imaging, the patient was contacted to come to the office today for examination and foley catheter placement. Of note, she does have a new patient appointment with Dr. Guadlupe with Urogynecology tomorrow.  Interval: She presents today to the office with her daughter. She reports doing well. No back pain or pelvic pain. Has had issues with voiding minimal amounts since her children were little. No dysuria or hematuria.   Exam:  Today's Vitals   07/14/24 1536 07/14/24 1615  BP: (!) 141/52 (!) 140/64  Pulse: 84   Resp: 20   Temp: 98.6 F (37 C)   TempSrc: Oral   SpO2: 100%   Weight: 179 lb 12.8 oz (81.6 kg)   PainSc: 0-No pain    Body mass index is 31.85 kg/m.  Alert, oriented, in no acute distress. Lungs clear. Heart regular in rate and rhythm. No CVA tenderness.  Abdomen is rounded and distended. Active bowel sounds. Non-tender. With consent and JN, RN as chaperone, foley catheter inserted in the bladder without difficulty. 2175 cc of clear, yellow urine out in bag. Patient/daughter reporting improvement in abdominal distension. No symptoms voiced after catheter placement.  Feels steady with ambulation. Urine sample obtained from catheter to test for infection given prolonged retention.  Assessment/Plan: 78 year old female with newly diagnosed uterine serous carcinoma with foley catheter placed today due to her severely dilated bladder noted on recent CT imaging. Patient tolerated procedure well today with no symptoms. We reviewed signs and symptoms to seek care for. Foley teaching performed. She has an appointment with UroGYN, Dr. Guadlupe tomorrow for further urinary evaluation. Advised to keep this appointment. All questions answered at end of the visit. Discussed further testing/labs if needed would be per Dr. Evy recommendations.

## 2024-07-14 NOTE — H&P (View-Only) (Signed)
 New Patient Evaluation and Consultation  Referring Provider: Micheline Eleanor BIRCH, NP PCP: Joshua Debby CROME, MD Date of Service: 07/15/2024  SUBJECTIVE Chief Complaint: New Patient (Initial Visit) (Bridget Butler is a 78 y.o. female here today for female organ prolapse.)  History of Present Illness: Bridget Butler is a 78 y.o. Black or African-American female seen in consultation at the request of NP Cross for evaluation of stage III pelvic organ prolapse with endometrial cancer.    Difficulty to start urine stream, interrupted stream, and urgency leakage since childhood Reports fluid pills during pregnancy due to water retention followed by 10lb weight loss Vaginal bulge felt like end of her nose started in 10/2023, progressed to pink-brown spotting evaluated by Dr. Dallie. Now cherry tomato size vaginal bulge managed by splinting for voids and bowel movements with relief. CT abdomen pelvis w/ constrast due to abdominal pain and distention 01/28/23, attributed to metformin  use Pending RATLH, BSO, omentectomy, sentinel lymph node dissection 07/20/24 by Dr. Viktoria Bladder distention noted on TVUS 01/2023, foley placed 07/14/24 with 2.2L drainage Pink tinged urine output in foley bag  Review of records significant for: T2DM with HbA1C 7 in 07/08/24, Stage IIIb CKD with Cr 1.05 in 01/04/24  CT chest, abdomen, pelvis with contrast CLINICAL DATA:  Endometrial cancer staging, high-grade serous endometrial cancer * Tracking Code: BO *   EXAM: CT CHEST, ABDOMEN, AND PELVIS WITH CONTRAST   TECHNIQUE: Multidetector CT imaging of the chest, abdomen and pelvis was performed following the standard protocol during bolus administration of intravenous contrast.   RADIATION DOSE REDUCTION: This exam was performed according to the departmental dose-optimization program which includes automated exposure control, adjustment of the mA and/or kV according to patient size and/or use of iterative reconstruction  technique.   CONTRAST:  OMNIPAQUE  IOHEXOL  300 MG/ML  SOLN   COMPARISON:  CT abdomen pelvis, 01/28/2023, CT chest, 01/04/2020   FINDINGS: CT CHEST FINDINGS   Cardiovascular: Aortic atherosclerosis. Normal heart size. Three-vessel coronary artery calcifications. No pericardial effusion.   Mediastinum/Nodes: No enlarged mediastinal, hilar, or axillary lymph nodes. Calcified thyroid  nodules, requiring no specific further follow-up or characterization in the setting of other known primary malignancy. Trachea and esophagus demonstrate no significant findings.   Lungs/Pleura: Lungs are clear. No pleural effusion or pneumothorax.   Musculoskeletal: No chest wall abnormality. No acute osseous findings.   CT ABDOMEN PELVIS FINDINGS   Hepatobiliary: No focal liver abnormality is seen. Status post cholecystectomy. No biliary dilatation.   Pancreas: Unremarkable. No pancreatic ductal dilatation or surrounding inflammatory changes.   Spleen: Normal in size without significant abnormality.   Adrenals/Urinary Tract: Adrenal glands are unremarkable. Kidneys are normal, without renal calculi, solid lesion, or hydronephrosis. Severely distended urinary bladder, maximum coronal span 21.7 cm (series 7, image 120).   Stomach/Bowel: Stomach is within normal limits. Appendix appears normal. No evidence of bowel wall thickening, distention, or inflammatory changes.   Vascular/Lymphatic: Aortic atherosclerosis. No enlarged abdominal or pelvic lymph nodes.   Reproductive: Large, heterogeneous mixed solid and cystic mass occupying the endometrial cavity and endocervical canal, measuring 9.6 x 5.8 x 5.7 cm (series 7, image 116, series 2, image 76).   Other: No abdominal wall hernia or abnormality. No ascites.   Musculoskeletal: No acute osseous findings.   IMPRESSION: 1. Large, heterogeneous mixed solid and cystic mass occupying the endometrial cavity and endocervical canal,  measuring 9.6 x 5.8 x 5.7 cm, consistent with primary endometrial malignancy. 2. No evidence of lymphadenopathy or metastatic disease in  the chest, abdomen, or pelvis. 3. Severely distended urinary bladder, maximum coronal span 21.7 cm similar to prior examination. Correlate for urinary retention. 4. Coronary artery disease.   Aortic Atherosclerosis (ICD10-I70.0).     Electronically Signed   By: Marolyn JONETTA Jaksch M.D.   On: 07/14/2024 09:38  CLINICAL DATA:  Abdominal pain and distension. Bloating, diarrhea and abdominal cramping.   EXAM: CT ABDOMEN AND PELVIS WITH CONTRAST   TECHNIQUE: Multidetector CT imaging of the abdomen and pelvis was performed using the standard protocol following bolus administration of intravenous contrast.   RADIATION DOSE REDUCTION: This exam was performed according to the departmental dose-optimization program which includes automated exposure control, adjustment of the mA and/or kV according to patient size and/or use of iterative reconstruction technique.   CONTRAST:  100mL ISOVUE -300 IOPAMIDOL  (ISOVUE -300) INJECTION 61%   COMPARISON:  None Available.   FINDINGS: Lower chest: Lung bases are clear. Heart is enlarged. No pericardial or pleural effusion. Distal esophagus is grossly unremarkable.   Hepatobiliary: Subcentimeter low-attenuation lesion in the left hepatic lobe, too small to characterize but likely a cyst. Liver is otherwise unremarkable. Cholecystectomy. No biliary ductal dilatation.   Pancreas: Negative.   Spleen: Negative.   Adrenals/Urinary Tract: Adrenal glands and kidneys are unremarkable. Ureters are decompressed. Marked bladder distension.   Stomach/Bowel: Stomach, small bowel, appendix and colon are unremarkable.   Vascular/Lymphatic: Atherosclerotic calcification of the aorta. No pathologically enlarged lymph nodes.   Reproductive: Uterus is visualized.  No adnexal mass.   Other: Small bilateral inguinal hernias  contain fat.  No free fluid.   Musculoskeletal: None.   IMPRESSION: 1. Marked bladder distension.  No hydronephrosis. 2.  Aortic atherosclerosis (ICD10-I70.0).     Electronically Signed   By: Newell Eke M.D.   On: 01/30/2023 13:34  Urinary Symptoms: Leaks urine with going from sitting to standing, with a full bladder, with movement to the bathroom, with urgency, and while asleep Leaks 4 time(s) per days mostly with postural changes and urgency Denies leakage with cough/laughing Pad use: 4 pad/adult diapers per day and 0-1 depend use at night Patient is bothered by UI symptoms.  Day time voids 3.  Nocturia: 1 times per night to void. Stops drinking fluids after medication use at 11pm, sleeps around 11:30pm to midnight Intermittent leg swelling, denies compression socks use Denies snoring Voiding dysfunction:  does not empty bladder well.  Patient does not use a catheter to empty bladder.  When urinating, patient feels a weak stream, difficulty starting urine stream, dribbling after finishing, the need to urinate multiple times in a row, and to push on her belly or vagina to empty bladder Drinks: 16oz water per day, intermittently 8oz of coffee or tea or soda or chocolate milk  UTIs: 0 UTI's in the last year.   Denies history of blood in urine, kidney or bladder stones, pyelonephritis, bladder cancer, and kidney cancer No results found for the last 90 days.   Pelvic Organ Prolapse Symptoms:                  Patient Admits to a feeling of a bulge the vaginal area. It has been present for 9 months.  Patient Admits to seeing a bulge.  This bulge is bothersome.  Bowel Symptom: Bowel movements: 2 time(s) per week Stool consistency: hard, Type IV stool with intermittent I stool. Straining: yes.  Splinting: yes.  Incomplete evacuation: yes.  Patient Admits to accidental bowel leakage / fecal incontinence with smearing after bowel movement  Occurs: 1 time(s) per month and  fecal smearing after bowel movement, improved since stopping meformin  Consistency with leakage: soft  Bowel regimen: fiber capsule Last colonoscopy: Results unavailable for review [facility] Colonoscopy          Completed or No Longer Recommended     Colonoscopy  Discontinued      Frequency changed to Never automatically (Topic No Longer Applies)   04/10/2016  Outside Claim: PR COLONOSCOPY W/BIOPSY SINGLE/MULTIPLE   Only the first 1 history entries have been loaded, but more history exists.                Sexual Function Sexually active: no.  Sexual orientation: Straight Pain with sex: No  Pelvic Pain Denies pelvic pain  Past Medical History:  Past Medical History:  Diagnosis Date   Cataract    had surgery   Diabetes mellitus without complication (HCC)    GERD (gastroesophageal reflux disease)    Hyperlipidemia    Hypertension    Multiple thyroid  nodules    Thyroid  disease      Past Surgical History:   Past Surgical History:  Procedure Laterality Date   CHOLECYSTECTOMY     COSMETIC SURGERY Bilateral    eyelids   EYE SURGERY     GALLBLADDER SURGERY     MICROLARYNGOSCOPY WITH CO2 LASER AND EXCISION OF VOCAL CORD LESION N/A 04/02/2020   Procedure: MICROLARYNGOSCOPY WITH DILATION AND CO2 LASER AND EXCISION OF VOCAL CORD LESION w/MITOMYCIN  C;  Surgeon: Carlie Clark, MD;  Location: [facility] OR;  Service: ENT;  Laterality: N/A;   MICROLARYNGOSCOPY WITH DILATION N/A 05/23/2020   Procedure: MICROLARYNGOSCOPY W/ DILATION AND JET VENTILATION;  Surgeon: Carlie Clark, MD;  Location: [facility] OR;  Service: ENT;  Laterality: N/A;   MICROLARYNGOSCOPY WITH DILATION N/A 07/13/2020   Procedure: MICROLARYNGOSCOPY with Jet Ventilation;  Surgeon: Carlie Clark, MD;  Location: [facility] OR;  Service: ENT;  Laterality: N/A;   TRACHEOSTOMY TUBE PLACEMENT N/A 04/02/2020   Procedure: Awake TRACHEOSTOMY;  Surgeon: Carlie Clark, MD;  Location: [facility] OR;  Service: ENT;  Laterality: N/A;   TUBAL LIGATION        Past OB/GYN History: OB History  Gravida Para Term Preterm AB Living  3 3 3   3   SAB IAB Ectopic Multiple Live Births      3    # Outcome Date GA Lbr Len/2nd Weight Sex Type Anes PTL Lv  3 Term     M Vag-Spont   LIV  2 Term     F Vag-Spont   LIV  1 Term     F Vag-Spont   LIV    Vaginal deliveries: largest infant 7lb,  Forceps/ Vacuum deliveries: 0, Cesarean section: 0 Menopausal: Yes, at age 77, Admits to vaginal bleeding since menopause Contraception: tubal ligation. Last pap smear.  Any history of abnormal pap smears: no.    Component Value Date/Time   DIAGPAP - Benign reactive/reparative changes 02/04/2022 1158   ADEQPAP  02/04/2022 1158    Satisfactory for evaluation. The presence or absence of an   ADEQPAP  02/04/2022 1158    endocervical/transformation zone component cannot be determined because   ADEQPAP of atrophy. 02/04/2022 1158    Medications: Patient has a current medication list which includes the following prescription(s): amlodipine , aspirin , true metrix meter, calcium  carbonate, cinnamon, freestyle libre 3 plus sensor, fiber, insulin  degludec, insulin  pen needle, multivitamin with minerals, nitrofurantoin  (macrocrystal-monohydrate), olmesartan , omeprazole , potassium chloride , pravastatin , ozempic  (0.25 or 0.5 mg/dose), senna-docusate, and tramadol .  Allergies: Patient is allergic to metformin  and related.   Social History:  Social History   Tobacco Use   Smoking status: Never   Smokeless tobacco: Never  Vaping Use   Vaping status: Never Used  Substance Use Topics   Alcohol use: No   Drug use: No    Relationship status: widowed Patient lives with her daughter and son.   Patient is not employed. Regular exercise: Yes: water fitness History of abuse: No  Family History:   Family History  Problem Relation Age of Onset   Heart disease Mother    Hypertension Mother    Uterine cancer Sister    Diabetes Sister    Diabetes Sister    Diabetes  Sister    Diabetes Sister    Diabetes Sister    Diabetes Brother    Breast cancer Maternal Aunt    Breast cancer Maternal Aunt    Breast cancer Cousin    Prostate cancer Neg Hx    Ovarian cancer Neg Hx    Pancreatic cancer Neg Hx    Colon cancer Neg Hx    Renal cancer Neg Hx      Review of Systems: Review of Systems  Constitutional:  Negative for fever, malaise/fatigue and weight loss.  Respiratory:  Negative for cough, shortness of breath and wheezing.   Cardiovascular:  Negative for chest pain, palpitations and leg swelling.  Gastrointestinal:  Positive for constipation. Negative for abdominal pain and blood in stool.  Genitourinary:  Negative for dysuria, frequency, hematuria and urgency.       Leakage, vaginal discharge  Skin:  Negative for rash.  Neurological:  Negative for dizziness, weakness and headaches.  Endo/Heme/Allergies:  Does not bruise/bleed easily.  Psychiatric/Behavioral:  Negative for depression. The patient is not nervous/anxious.      OBJECTIVE Physical Exam: Vitals:   07/15/24 1257  BP: 126/69  Pulse: 85  Weight: 175 lb 9.6 oz (79.7 kg)  Height: 5' 1.42 (1.56 m)    Physical Exam Constitutional:      General: She is not in acute distress.    Appearance: Normal appearance.  Genitourinary:     Bladder and urethral meatus normal.     No lesions in the vagina.     Right Labia: No rash, tenderness, lesions, skin changes or Bartholin's cyst.    Left Labia: No tenderness, lesions, skin changes, Bartholin's cyst or rash.    No vaginal discharge, erythema, tenderness, bleeding, ulceration or granulation tissue.     Posterior and apical vaginal prolapse present.    Moderate vaginal atrophy present.     Right Adnexa: not tender, not full and no mass present.    Left Adnexa: not tender, not full and no mass present.    No cervical motion tenderness, discharge, friability, lesion, polyp or nabothian cyst.     Uterus is enlarged and prolapsed.      Uterus is not fixed, tender or irregular.     Uterine mass present.    Urethral meatus caruncle not present.    No urethral prolapse, tenderness, mass, hypermobility or discharge present.     Urethra exam comments: Foley catheter in place with blood tinged urine.     Bladder is not tender, urgency on palpation not present and masses not present.      Levator ani not tender, obturator internus not tender, no asymmetrical contractions present and no pelvic spasms present.    Symmetrical pelvic sensation, anal wink present and BC reflex present. Cardiovascular:  Rate and Rhythm: Normal rate.  Pulmonary:     Effort: Pulmonary effort is normal. No respiratory distress.  Abdominal:     General: There is no distension.     Palpations: Abdomen is soft. There is no mass.     Tenderness: There is no abdominal tenderness.     Hernia: No hernia is present.   Neurological:     Mental Status: She is alert.  Vitals reviewed. Exam conducted with a chaperone present.      POP-Q:   POP-Q  -3                                            Aa   -3                                           Ba  1                                              C   2                                            Gh  4                                            Pb  7                                            tvl   -3                                            Ap  -3                                            Bp  -3                                              D      Laboratory Results: Lab Results  Component Value Date   COLORU yellow 04/25/2020   CLARITYU cloudy (A) 04/25/2020   GLUCOSEUR >=1,000 (A) 04/25/2020   BILIRUBINUR NEGATIVE 07/14/2024   SPECGRAV 1.020 04/25/2020   RBCUR moderate (A) 04/25/2020   PHUR 6.0 04/25/2020   PROTEINUR NEGATIVE 07/14/2024   UROBILINOGEN 0.2 07/28/2023   LEUKOCYTESUR NEGATIVE 07/14/2024    Lab Results  Component Value Date   CREATININE 1.05 01/04/2024    CREATININE 1.14 07/28/2023   CREATININE  1.10 02/18/2023    Lab Results  Component Value Date   HGBA1C 7.0 (H) 07/08/2024    Lab Results  Component Value Date   HGB 11.2 (L) 01/04/2024     ASSESSMENT AND PLAN Ms. Franca is a 78 y.o. with:  1. Incomplete uterovaginal prolapse   2. Urinary retention   3. Foley catheter in place   4. Constipation, unspecified constipation type     Incomplete uterovaginal prolapse Assessment & Plan: - For treatment of pelvic organ prolapse, we discussed options for management including expectant management, conservative management, and surgical management, such as Kegels, a pessary, pelvic floor physical therapy, and specific surgical procedures. - We discussed laparoscopic repair without mesh at the time of RATLH for endometrial cancer by Dr. Viktoria on 07/20/24 - Pros - safer, no mesh complications - Cons - not as strong as mesh repair, higher risk of recurrence - discussed possible outlet obstruction from prolapse vs. Enlarged endocervix vs. Detrusor overdistention - discussed need to reassess bladder emptying postop and despite risk of SUI will need to reassess postop after recovery if additional treatment is needed  Orders: -     Ambulatory Referral For Surgery Scheduling  Urinary retention Assessment & Plan: - urinary retention incontinence, discussed possible stress urinary incontinence. However will not recommend anti-incontinence procedure until after resolution of urinary retention - discussed possible outlet obstruction from prolapse vs. Enlarged endocervix vs. Detrusor overdistention - marked bladder distention without hydronephrosis on CT in 01/30/23 - discussed need to reassess postop and reviewed possible CIC if prolonged urinary retention despite relief of outlet obstrcution - continue foley catheter management at this time with void trial 1 week postop - discussed possible need for sacral neuromodulation if refractory  symptoms  Orders: -     Ambulatory Referral For Surgery Scheduling -     Nitrofurantoin  Monohyd Macro; Take 1 capsule (100 mg total) by mouth 2 (two) times daily for 5 days.  Dispense: 10 capsule; Refill: 0  Foley catheter in place Assessment & Plan: - foley in place with blood tinged urine - Stat lock in place, discussed need to avoid traction injury on urethra - pending urine culture, start Rx macrobid  for prophylaxis due to pending surgery 07/20/24 - applied foley plug and leg bag for ease of use, pt instructed to reattached to foley bag at night due to night time urinary frequency prior to foley placement - encouraged emptying/unplugging foley every 3 hours or earlier if she experiences bladder fullness.   Constipation, unspecified constipation type Assessment & Plan: - For constipation, we reviewed the importance of a better bowel regimen.  We also discussed the importance of avoiding chronic straining, as it can exacerbate her pelvic floor symptoms; we discussed treating constipation and straining prior to surgery, as postoperative straining can lead to damage to the repair and recurrence of symptoms. We discussed initiating therapy with increasing fluid intake, fiber supplementation, stool softeners, and laxatives such as miralax .  - encouraged titration of miralax  prior to surgery to optimize stool consistency and avoid straining     Plan for surgery: Exam under anesthesia, robotic assisted laparoscopic uterosacral ligament suspension, possible posterior repair, cystourethroscopy  - We reviewed the patient's specific anatomic and functional findings, with the assistance of diagrams, and together finalized the above procedure. The planned surgical procedures were discussed along with the surgical risks outlined below, which were also provided on a detailed handout. Additional treatment options including expectant management, conservative management, medical management were discussed  where appropriate.  We  reviewed the benefits and risks of each treatment option.   General Surgical Risks: For all procedures, there are risks of bleeding, infection, damage to surrounding organs including but not limited to bowel, bladder, blood vessels, ureters and nerves, and need for further surgery if an injury were to occur. These risks are all low with minimally invasive surgery.   There are risks of numbness and weakness at any body site or buttock/rectal pain.  It is possible that baseline pain can be worsened by surgery, either with or without mesh. If surgery is vaginal, there is also a low risk of possible conversion to laparoscopy or open abdominal incision where indicated. Very rare risks include blood transfusion, blood clot, heart attack, pneumonia, or death.   There is also a risk of short-term postoperative urinary retention with need to use a catheter. About half of patients need to go home from surgery with a catheter, which is then later removed in the office. The risk of long-term need for a catheter is very low. There is also a risk of worsening of overactive bladder.   Prolapse (with or without mesh): Risk factors for surgical failure  include things that put pressure on your pelvis and the surgical repair, including obesity, chronic cough, and heavy lifting or straining (including lifting children or adults, straining on the toilet, or lifting heavy objects such as furniture or anything weighing >25 lbs. Risks of recurrence is 20-30% with vaginal native tissue repair and a less than 10% with sacrocolpopexy with mesh.    - For preop Visit:  She is required to have a visit within 30 days of her surgery.   Today we reviewed pre-operative preparation, peri-operative expectations, and post-operative instructions/recovery.  She was provided with instructional handouts. She understands not to take aspirin  (>81mg ) or NSAIDs 7 days prior to surgery.   - Medical clearance: required,  pending with gyn oncology - Anticoagulant use: Yes - Medicaid Hysterectomy form: No - Accepts blood transfusion: No - Expected length of stay: outpatient  Request sent for surgery scheduling.   Time spent: I spent 82 minutes dedicated to the care of this patient on the date of this encounter to include pre-visit review of records, face-to-face time with the patient discussing stage II pelvic organ prolapse, urinary retention, indwelling foley, constipation, and post visit documentation and ordering medication/ testing.     Lianne ONEIDA Gillis, MD

## 2024-07-15 ENCOUNTER — Telehealth: Payer: Self-pay

## 2024-07-15 ENCOUNTER — Ambulatory Visit (INDEPENDENT_AMBULATORY_CARE_PROVIDER_SITE_OTHER): Admitting: Obstetrics

## 2024-07-15 ENCOUNTER — Telehealth: Payer: Self-pay | Admitting: *Deleted

## 2024-07-15 ENCOUNTER — Encounter: Payer: Self-pay | Admitting: Obstetrics

## 2024-07-15 VITALS — BP 126/69 | HR 85 | Ht 61.42 in | Wt 175.6 lb

## 2024-07-15 DIAGNOSIS — N812 Incomplete uterovaginal prolapse: Secondary | ICD-10-CM | POA: Diagnosis not present

## 2024-07-15 DIAGNOSIS — K5900 Constipation, unspecified: Secondary | ICD-10-CM | POA: Diagnosis not present

## 2024-07-15 DIAGNOSIS — R339 Retention of urine, unspecified: Secondary | ICD-10-CM | POA: Diagnosis not present

## 2024-07-15 DIAGNOSIS — Z978 Presence of other specified devices: Secondary | ICD-10-CM

## 2024-07-15 DIAGNOSIS — K5909 Other constipation: Secondary | ICD-10-CM | POA: Insufficient documentation

## 2024-07-15 MED ORDER — NITROFURANTOIN MONOHYD MACRO 100 MG PO CAPS: 100.0000 mg | ORAL_CAPSULE | Freq: Two times a day (BID) | ORAL | 0 refills | Status: AC

## 2024-07-15 NOTE — Patient Instructions (Addendum)
 You have a stage 2 (out of 4) prolapse.  We discussed the fact that it is not life threatening but there are several treatment options. For treatment of pelvic organ prolapse, we discussed options for management including expectant management, conservative management, and surgical management, such as Kegels, a pessary, pelvic floor physical therapy, and specific surgical procedures.     We discussed laparoscopic repair with mesh - Pros - safer, no mesh complications - Cons - not as strong as mesh repair, higher risk of recurrence  Constipation: Our goal is to achieve formed bowel movements daily or every-other-day.  You may need to try different combinations of the following options to find what works best for you - everybody's body works differently so feel free to adjust the dosages as needed.  Some options to help maintain bowel health include:  Dietary changes (more leafy greens, vegetables and fruits; less processed foods) Fiber supplementation (Benefiber, FiberCon, Metamucil or Psyllium). Start slow and increase gradually to full dose. Over-the-counter agents such as: stool softeners (Docusate or Colace) and/or laxatives (Miralax , milk of magnesia)  Power Pudding is a natural mixture that may help your constipation.  To make blend 1 cup applesauce, 1 cup wheat bran, and 3/4 cup prune juice, refrigerate and then take 1 tablespoon daily with a large glass of water as needed.   Women should try to eat at least 21 to 25 grams of fiber a day, while men should aim for 30 to 38 grams a day. You can add fiber to your diet with food or a fiber supplement such as psyllium (metamucil), benefiber, or fibercon.   Here's a look at how much dietary fiber is found in some common foods. When buying packaged foods, check the Nutrition Facts label for fiber content. It can vary among brands.  Fruits Serving size Total fiber (grams)*  Raspberries 1 cup 8.0  Pear 1 medium 5.5  Apple, with skin 1 medium 4.5   Banana 1 medium 3.0  Orange 1 medium 3.0  Strawberries 1 cup 3.0   Vegetables Serving size Total fiber (grams)*  Green peas, boiled 1 cup 9.0  Broccoli, boiled 1 cup chopped 5.0  Turnip greens, boiled 1 cup 5.0  Brussels sprouts, boiled 1 cup 4.0  Potato, with skin, baked 1 medium 4.0  Sweet corn, boiled 1 cup 3.5  Cauliflower, raw 1 cup chopped 2.0  Carrot, raw 1 medium 1.5   Grains Serving size Total fiber (grams)*  Spaghetti, whole-wheat, cooked 1 cup 6.0  Barley, pearled, cooked 1 cup 6.0  Bran flakes 3/4 cup 5.5  Quinoa, cooked 1 cup 5.0  Oat bran muffin 1 medium 5.0  Oatmeal, instant, cooked 1 cup 5.0  Popcorn, air-popped 3 cups 3.5  Brown rice, cooked 1 cup 3.5  Bread, whole-wheat 1 slice 2.0  Bread, rye 1 slice 2.0   Legumes, nuts and seeds Serving size Total fiber (grams)*  Split peas, boiled 1 cup 16.0  Lentils, boiled 1 cup 15.5  Black beans, boiled 1 cup 15.0  Baked beans, canned 1 cup 10.0  Chia seeds 1 ounce 10.0  Almonds 1 ounce (23 nuts) 3.5  Pistachios 1 ounce (49 nuts) 3.0  Sunflower kernels 1 ounce 3.0  *Rounded to nearest 0.5 gram. Source: [employer] for Standard Reference, Legacy Release    We discussed need for foley catheter placement and reassessment of urinary retention needed postop.  You can use a plug or leg bag for ease of use. Please unplug your foley  every 3 hours or whenever your bladder feeling full.   Start macrobid  100mg  twice a day until surgery.

## 2024-07-15 NOTE — Assessment & Plan Note (Addendum)
-   foley in place with blood tinged urine - Stat lock in place, discussed need to avoid traction injury on urethra - pending urine culture, start Rx macrobid  for prophylaxis due to pending surgery 07/20/24 - applied foley plug and leg bag for ease of use, pt instructed to reattached to foley bag at night due to night time urinary frequency prior to foley placement - encouraged emptying/unplugging foley every 3 hours or earlier if she experiences bladder fullness.

## 2024-07-15 NOTE — Assessment & Plan Note (Signed)
-   For constipation, we reviewed the importance of a better bowel regimen.  We also discussed the importance of avoiding chronic straining, as it can exacerbate her pelvic floor symptoms; we discussed treating constipation and straining prior to surgery, as postoperative straining can lead to damage to the repair and recurrence of symptoms. We discussed initiating therapy with increasing fluid intake, fiber supplementation, stool softeners, and laxatives such as miralax .  - encouraged titration of miralax  prior to surgery to optimize stool consistency and avoid straining

## 2024-07-15 NOTE — Assessment & Plan Note (Signed)
-   For treatment of pelvic organ prolapse, we discussed options for management including expectant management, conservative management, and surgical management, such as Kegels, a pessary, pelvic floor physical therapy, and specific surgical procedures. - We discussed laparoscopic repair without mesh at the time of [facility] for endometrial cancer by Dr. Viktoria on 07/20/24 - Pros - safer, no mesh complications - Cons - not as strong as mesh repair, higher risk of recurrence - discussed possible outlet obstruction from prolapse vs. Enlarged endocervix vs. Detrusor overdistention - discussed need to reassess bladder emptying postop and despite risk of SUI will need to reassess postop after recovery if additional treatment is needed

## 2024-07-15 NOTE — Telephone Encounter (Signed)
 FMLA forms received from pt's daughter,Angela Cole, works for [employer]. Intermittent leave request. Requested information provided.  Jon aware forms are ready for pick-up and also emailed to her per her request.

## 2024-07-15 NOTE — Assessment & Plan Note (Addendum)
-   urinary retention incontinence, discussed possible stress urinary incontinence. However will not recommend anti-incontinence procedure until after resolution of urinary retention - discussed possible outlet obstruction from prolapse vs. Enlarged endocervix vs. Detrusor overdistention - marked bladder distention without hydronephrosis on CT in 01/30/23 - discussed need to reassess postop and reviewed possible CIC if prolonged urinary retention despite relief of outlet obstrcution - continue foley catheter management at this time with void trial 1 week postop - discussed possible need for sacral neuromodulation if refractory symptoms

## 2024-07-15 NOTE — Telephone Encounter (Signed)
 Spoke with Bridget Butler this morning to check in. Pt states she is feeling well. She reports emptying the foley catheter last night and the bag was 1/3 full. And she emptied the bag again this morning and it was 1/2 full. Pt also states her urine last night was a bright pink and this morning it's a lighter pink.   Advised patient that [name], NP would like to draw labs to check baseline kidney function. Offered patient an appointment at the cancer center and patient states she prefers to have labs drawn at uro/gyn office with her appointment today. Advised patient her message would be relayed to providers.

## 2024-07-16 LAB — URINE CULTURE: Culture: NO GROWTH

## 2024-07-18 ENCOUNTER — Ambulatory Visit (INDEPENDENT_AMBULATORY_CARE_PROVIDER_SITE_OTHER): Admitting: Family Medicine

## 2024-07-18 ENCOUNTER — Encounter: Payer: Self-pay | Admitting: Family Medicine

## 2024-07-18 ENCOUNTER — Ambulatory Visit: Payer: Self-pay

## 2024-07-18 ENCOUNTER — Ambulatory Visit: Admitting: Internal Medicine

## 2024-07-18 VITALS — BP 142/66 | HR 92 | Temp 98.2°F | Resp 18 | Ht 61.42 in | Wt 175.0 lb

## 2024-07-18 DIAGNOSIS — N1831 Chronic kidney disease, stage 3a: Secondary | ICD-10-CM

## 2024-07-18 DIAGNOSIS — E1122 Type 2 diabetes mellitus with diabetic chronic kidney disease: Secondary | ICD-10-CM

## 2024-07-18 DIAGNOSIS — Z01818 Encounter for other preprocedural examination: Secondary | ICD-10-CM

## 2024-07-18 DIAGNOSIS — I152 Hypertension secondary to endocrine disorders: Secondary | ICD-10-CM | POA: Diagnosis not present

## 2024-07-18 DIAGNOSIS — C541 Malignant neoplasm of endometrium: Secondary | ICD-10-CM | POA: Diagnosis not present

## 2024-07-18 DIAGNOSIS — E1159 Type 2 diabetes mellitus with other circulatory complications: Secondary | ICD-10-CM

## 2024-07-18 DIAGNOSIS — N812 Incomplete uterovaginal prolapse: Secondary | ICD-10-CM | POA: Diagnosis not present

## 2024-07-18 DIAGNOSIS — Z794 Long term (current) use of insulin: Secondary | ICD-10-CM | POA: Diagnosis not present

## 2024-07-18 MED ORDER — OLMESARTAN MEDOXOMIL 20 MG PO TABS: 20.0000 mg | ORAL_TABLET | Freq: Every day | ORAL | 0 refills | Status: DC

## 2024-07-18 MED ORDER — AMLODIPINE BESYLATE 5 MG PO TABS
5.0000 mg | ORAL_TABLET | Freq: Every day | ORAL | 0 refills | Status: DC
Start: 2024-07-18 — End: 2024-08-08

## 2024-07-18 NOTE — Progress Notes (Addendum)
 Assessment & Plan:  1-3. Pre-op evaluation/Endometrial cancer (HCC) (Primary)/Incomplete uterovaginal prolapse I have independently evaluated patient.  She has clinical risk factors including diabetes and chronic kidney disease, which place her at increased risk compared with a patient without comorbidities. However, her function capacity is good, her baseline EKG is reassuring, and her calculated risk of major adverse cardiac event is ~5%. She is reasonably optimized from a cardiovascular standpoint and may proceed with surgery with a moderate but acceptable perioperative cardiac risk. Recommend continuation of guideline-directed medical therapy and standard perioperative monitoring.    4. Type 2 diabetes mellitus with stage 3a chronic kidney disease, with long-term current use of insulin  (HCC) - EKG 12-Lead  5. Stage 3a chronic kidney disease (HCC) - olmesartan  (BENICAR ) 20 MG tablet; Take 1 tablet (20 mg total) by mouth daily.  Dispense: 90 tablet; Refill: 0  6. Hypertension associated with diabetes (HCC) - amLODipine  (NORVASC ) 5 MG tablet; Take 1 tablet (5 mg total) by mouth daily.  Dispense: 90 tablet; Refill: 0 - olmesartan  (BENICAR ) 20 MG tablet; Take 1 tablet (20 mg total) by mouth daily.  Dispense: 90 tablet; Refill: 0   Return for as scheduled with PCP.  Subjective:      HPI: Pt is a 78 y.o. female who is here for preoperative clearance for hysterectomy and pelvic organ prolapse repair. Patient is accompanied by her daughter, who she is okay with being present.  1) High Risk Cardiac Conditions:  1) Recent MI - No.  2) Decompensated Heart Failure - No.  3) Unstable angina - No.  4) Symptomatic arrythmia - No.  5) Sx Valvular Disease - No.  2) Intermediate Risk Factors: DM, CKD, CVA, CHF, CAD - Yes.    2) Functional Status: > 4 mets (Walk, run, climb stairs) Yes.  [name] Hails Activity Status Index: 8.3  3) Surgery Specific Risk: High   4) Further Noninvasive evaluation:    1) EKG - Yes.     Hx of MI, CVA, CAD, DM, CKD  2) Echo - No.   Worsening dyspnea or CHF without an echo in the past year  3) Stress Testing - Active Cardiac Disease - No.  4) CXR No.   If asymptomatic, healthy, no respiratory symptoms it is not needed  5)PFTs No.   OSA, OHS, or significant cardiopulmonary history  5) Need for medical therapy - Beta Blocker, Statins indicated ? No, but should continue pravastatin  as already prescribed.  Social history:  Relevant past medical, surgical, family and social history reviewed and updated as indicated. Interim medical history since our last visit reviewed.  Allergies and medications reviewed and updated.  DATA REVIEWED: CHART IN EPIC  ROS: Negative unless specifically indicated above in HPI.    Current Outpatient Medications:    aspirin  81 MG chewable tablet, Chew 81 mg by mouth daily., Disp: , Rfl:    Blood Glucose Monitoring Suppl (TRUE METRIX METER) DEVI, 1 Device by Does not apply route 2 (two) times daily., Disp: 1 each, Rfl: 2   calcium  carbonate (OSCAL) 1500 (600 Ca) MG TABS tablet, Take 600 mg of elemental calcium  by mouth daily with breakfast., Disp: , Rfl:    Cholecalciferol  (VITAMIN D3) 125 MCG (5000 UT) CAPS, Take 5,000 Units by mouth daily., Disp: , Rfl:    CINNAMON PO, Take 2,000 mg by mouth daily., Disp: , Rfl:    Continuous Glucose Sensor (FREESTYLE LIBRE 3 PLUS SENSOR) MISC, 1 each by Does not apply route every 14 (fourteen) days.,  Disp: 6 each, Rfl: 3   FIBER PO, Take 1 capsule by mouth daily., Disp: , Rfl:    insulin  degludec (TRESIBA) 200 UNIT/ML FlexTouch Pen, Inject 16 Units into the skin daily., Disp: , Rfl:    Insulin  Pen Needle 32G X 6 MM MISC, 1 Act by Does not apply route once a week., Disp: 100 each, Rfl: 1   Multiple Vitamin (MULTIVITAMIN WITH MINERALS) TABS tablet, Take 1 tablet by mouth daily., Disp: , Rfl:    nitrofurantoin , macrocrystal-monohydrate, (MACROBID ) 100 MG capsule, Take 1 capsule (100 mg total)  by mouth 2 (two) times daily for 5 days., Disp: 10 capsule, Rfl: 0   omeprazole  (PRILOSEC) 40 MG capsule, TAKE 1 CAPSULE BY MOUTH TWICE DAILY IN THE MORNING AND AT BEDTIME, Disp: 180 capsule, Rfl: 0   potassium chloride  (KLOR-CON  10) 10 MEQ tablet, Take 1 tablet (10 mEq total) by mouth 2 (two) times daily., Disp: 180 tablet, Rfl: 1   pravastatin  (PRAVACHOL ) 40 MG tablet, Take 1 tablet (40 mg total) by mouth at bedtime., Disp: 90 tablet, Rfl: 1   amLODipine  (NORVASC ) 5 MG tablet, Take 1 tablet (5 mg total) by mouth daily., Disp: 90 tablet, Rfl: 0   olmesartan  (BENICAR ) 20 MG tablet, Take 1 tablet (20 mg total) by mouth daily., Disp: 90 tablet, Rfl: 0   Semaglutide ,0.25 or 0.5MG /DOS, (OZEMPIC , 0.25 OR 0.5 MG/DOSE,) 2 MG/3ML SOPN, Inject 0.5mg  into skin weekly (Patient not taking: Reported on 07/18/2024), Disp: , Rfl:    senna-docusate (SENOKOT-S) 8.6-50 MG tablet, Take 2 tablets by mouth at bedtime. For AFTER surgery, do not take if having diarrhea, Disp: 30 tablet, Rfl: 0   traMADol  (ULTRAM ) 50 MG tablet, Take 1 tablet (50 mg total) by mouth every 6 (six) hours as needed for moderate pain (pain score 4-6). For AFTER surgery only, do not take and drive, Disp: 10 tablet, Rfl: 0      Objective:    BP (!) 142/66   Pulse 92   Temp 98.2 F (36.8 C)   Resp 18   Ht 5' 1.42 (1.56 m)   Wt 175 lb (79.4 kg)   SpO2 99%   BMI 32.62 kg/m   Wt Readings from Last 3 Encounters:  07/18/24 175 lb (79.4 kg)  07/15/24 175 lb 9.6 oz (79.7 kg)  07/14/24 179 lb 12.8 oz (81.6 kg)    Physical Exam Vitals reviewed.  Constitutional:      General: She is not in acute distress.    Appearance: Normal appearance. She is obese. She is not ill-appearing, toxic-appearing or diaphoretic.  HENT:     Head: Normocephalic and atraumatic.     Right Ear: Tympanic membrane, ear canal and external ear normal. There is no impacted cerumen.     Left Ear: Tympanic membrane, ear canal and external ear normal. There is no  impacted cerumen.     Nose: Nose normal. No congestion or rhinorrhea.     Mouth/Throat:     Mouth: Mucous membranes are moist.     Pharynx: Oropharynx is clear. No oropharyngeal exudate or posterior oropharyngeal erythema.  Eyes:     General: No scleral icterus.       Right eye: No discharge.        Left eye: No discharge.     Conjunctiva/sclera: Conjunctivae normal.     Pupils: Pupils are equal, round, and reactive to light.  Cardiovascular:     Rate and Rhythm: Normal rate and regular rhythm.     Heart  sounds: Normal heart sounds. No murmur heard.    No friction rub. No gallop.  Pulmonary:     Effort: Pulmonary effort is normal. No respiratory distress.     Breath sounds: Normal breath sounds. No stridor. No wheezing, rhonchi or rales.  Abdominal:     General: Abdomen is flat. Bowel sounds are normal. There is no distension.     Palpations: Abdomen is soft. There is no hepatomegaly, splenomegaly or mass.     Tenderness: There is no abdominal tenderness. There is no guarding or rebound.     Hernia: No hernia is present.  Musculoskeletal:        General: Normal range of motion.     Cervical back: Normal range of motion and neck supple. No rigidity. No muscular tenderness.  Lymphadenopathy:     Cervical: No cervical adenopathy.  Skin:    General: Skin is warm and dry.     Capillary Refill: Capillary refill takes less than 2 seconds.  Neurological:     General: No focal deficit present.     Mental Status: She is alert and oriented to person, place, and time. Mental status is at baseline.  Psychiatric:        Mood and Affect: Mood normal.        Behavior: Behavior normal.        Thought Content: Thought content normal.        Judgment: Judgment normal.    EKG: normal EKG, normal sinus rhythm; HR 82.

## 2024-07-18 NOTE — Telephone Encounter (Signed)
-----   Message from Bridget Butler sent at 07/18/2024  9:42 AM EDT ----- No evidence of UTI. ----- Message ----- From: Rebecka, [facility] Sent: 07/14/2024   6:07 PM EDT To: Bridget JONETTA Epps, NP

## 2024-07-18 NOTE — Telephone Encounter (Signed)
 Bridget Butler is aware of negative urine results as reported by [name] NP.  She was happy for the call.   Bridget Butler also states she has an appointment today at her PCP's office for surgical clearance. Bridget Butler notified)

## 2024-07-18 NOTE — Patient Instructions (Signed)
 SURGICAL WAITING ROOM VISITATION Patients having surgery or a procedure may have no more than 2 support people in the waiting area - these visitors may rotate in the visitor waiting room.   Due to an increase in RSV and influenza rates and associated hospitalizations, children ages 12 and under may not visit patients in [facility] hospitals. If the patient needs to stay at the hospital during part of their recovery, the visitor guidelines for inpatient rooms apply.  PRE-OP VISITATION  Pre-op nurse will coordinate an appropriate time for 1 support person to accompany the patient in pre-op.  This support person may not rotate.  This visitor will be contacted when the time is appropriate for the visitor to come back in the pre-op area.  Please refer to the [facility] website for the visitor guidelines for Inpatients (after your surgery is over and you are in a regular room).  You are not required to quarantine at this time prior to your surgery. However, you must do this: Hand Hygiene often Do NOT share personal items Notify your provider if you are in close contact with someone who has COVID or you develop fever 100.4 or greater, new onset of sneezing, cough, sore throat, shortness of breath or body aches.  If you test positive for Covid or have been in contact with anyone that has tested positive in the last 10 days please notify you surgeon.    Your procedure is scheduled on:  07/20/24  Report to [facility] Main Entrance: [place] entrance where the [employer] is available.   Report to admitting at:10:45 AM  Call this number if you have any questions or problems the morning of surgery [phone]  FOLLOW ANY ADDITIONAL PRE OP INSTRUCTIONS YOU RECEIVED FROM YOUR SURGEON'S OFFICE!!!  Eat a light diet the day before surgery.  Examples including soups, broths, toast, yogurt, mashed potatoes.  Things to avoid include carbonated beverages (fizzy beverages), raw fruits and raw  vegetables, or beans.   If your bowels are filled with gas, your surgeon will have difficulty visualizing your pelvic organs which increases your surgical risks.  Do not eat food after Midnight the night prior to your surgery/procedure.  After Midnight you may have the following liquids until : 10:00 AM DAY OF SURGERY  Clear Liquid Diet Water Black Coffee (sugar ok, NO MILK/CREAM OR CREAMERS)  Tea (sugar ok, NO MILK/CREAM OR CREAMERS) regular and decaf                             Plain Jell-O  with no fruit (NO RED)                                           Fruit ices (not with fruit pulp, NO RED)                                     Popsicles (NO RED)  Juice: NO CITRUS JUICES: only apple, WHITE grape, WHITE cranberry Sports drinks like Gatorade or Powerade (NO RED)    Oral Hygiene is also important to reduce your risk of infection.        Remember - BRUSH YOUR TEETH THE MORNING OF SURGERY WITH YOUR REGULAR TOOTHPASTE  STOP TAKING all Vitamins, Herbs and supplements 1 week before your surgery.   Take ONLY these medicines the morning of surgery with A SIP OF WATER: amlodipine ,omeprazole . How to Manage Your Diabetes Before and After Surgery  Why is it important to control my blood sugar before and after surgery? Improving blood sugar levels before and after surgery helps healing and can limit problems. A way of improving blood sugar control is eating a healthy diet by:  Eating less sugar and carbohydrates  Increasing activity/exercise  Talking with your doctor about reaching your blood sugar goals High blood sugars (greater than 180 mg/dL) can raise your risk of infections and slow your recovery, so you will need to focus on controlling your diabetes during the weeks before surgery. Make sure that the doctor who takes care of your diabetes knows about your planned surgery including the date and location.  How do I manage  my blood sugar before surgery? Check your blood sugar at least 4 times a day, starting 2 days before surgery, to make sure that the level is not too high or low. Check your blood sugar the morning of your surgery when you wake up and every 2 hours until you get to the Short Stay unit. If your blood sugar is less than 70 mg/dL, you will need to treat for low blood sugar: Do not take insulin . Treat a low blood sugar (less than 70 mg/dL) with  cup of clear juice (cranberry or apple), 4 glucose tablets, OR glucose gel. Recheck blood sugar in 15 minutes after treatment (to make sure it is greater than 70 mg/dL). If your blood sugar is not greater than 70 mg/dL on recheck, call [phone] for further instructions. Report your blood sugar to the short stay nurse when you get to Short Stay.  If you are admitted to the hospital after surgery: Your blood sugar will be checked by the staff and you will probably be given insulin  after surgery (instead of oral diabetes medicines) to make sure you have good blood sugar levels. The goal for blood sugar control after surgery is 80-180 mg/dL.   WHAT DO I DO ABOUT MY DIABETES MEDICATION?  Do not take oral diabetes medicines (pills) the morning of surgery.      THE MORNING OF SURGERY, take ONLY half of tresiba insulin  dose ( 8 units).  DO NOT TAKE THE FOLLOWING 7 DAYS PRIOR TO SURGERY: Ozempic , Wegovy, Rybelsus (Semaglutide ), Byetta (exenatide), Bydureon (exenatide ER), Victoza, Saxenda (liraglutide), or Trulicity  (dulaglutide ) Mounjaro (Tirzepatide) Adlyxin (Lixisenatide), Polyethylene Glycol Loxenatide.  If You have been diagnosed with Sleep Apnea - Bring CPAP mask and tubing day of surgery. We will provide you with a CPAP machine on the day of your surgery.                   You may not have any metal on your body including hair pins, jewelry, and body piercing  Do not wear make-up, lotions, powders, perfumes / cologne, or deodorant  Do not wear nail  polish including gel and S&S, artificial / acrylic nails, or any other type of covering on natural nails including finger and toenails. If you have artificial nails,  gel coating, etc., that needs to be removed by a nail salon, Please have this removed prior to surgery. Not doing so may mean that your surgery could be cancelled or delayed if the Surgeon or anesthesia staff feels like they are unable to monitor you safely.   Do not shave 48 hours prior to surgery to avoid nicks in your skin which may contribute to postoperative infections.   Contacts, Hearing Aids, dentures or bridgework may not be worn into surgery. DENTURES WILL BE REMOVED PRIOR TO SURGERY PLEASE DO NOT APPLY Poly grip OR ADHESIVES!!!  You may bring a small overnight bag with you on the day of surgery, only pack items that are not valuable. [facility] IS NOT RESPONSIBLE   FOR VALUABLES THAT ARE LOST OR STOLEN.   Patients discharged on the day of surgery will not be allowed to drive home.  Someone NEEDS to stay with you for the first 24 hours after anesthesia.  Do not bring your home medications to the hospital. The Pharmacy will dispense medications listed on your medication list to you during your admission in the Hospital.  Special Instructions: Bring a copy of your healthcare power of attorney and living will documents the day of surgery, if you wish to have them scanned into your [facility]- EPIC  Please read over the following fact sheets you were given: IF YOU HAVE QUESTIONS ABOUT YOUR PRE-OP INSTRUCTIONS, PLEASE CALL [phone]   [facility] - Preparing for Surgery Before surgery, you can play an important role.  Because skin is not sterile, your skin needs to be as free of germs as possible.  You can reduce the number of germs on your skin by washing with CHG (chlorahexidine gluconate) soap before surgery.  CHG is an antiseptic cleaner which kills germs and bonds with the skin to continue killing germs  even after washing. Please DO NOT use if you have an allergy to CHG or antibacterial soaps.  If your skin becomes reddened/irritated stop using the CHG and inform your nurse when you arrive at Short Stay. Do not shave (including legs and underarms) for at least 48 hours prior to the first CHG shower.  You may shave your face/neck.  Please follow these instructions carefully:  1.  Shower with CHG Soap the night before surgery and the  morning of surgery.  2.  If you choose to wash your hair, wash your hair first as usual with your normal  shampoo.  3.  After you shampoo, rinse your hair and body thoroughly to remove the shampoo.                             4.  Use CHG as you would any other liquid soap.  You can apply chg directly to the skin and wash.  Gently with a scrungie or clean washcloth.  5.  Apply the CHG Soap to your body ONLY FROM THE NECK DOWN.   Do not use on face/ open                           Wound or open sores. Avoid contact with eyes, ears mouth and genitals (private parts).                       Wash face,  Genitals (private parts) with your normal soap.  6.  Wash thoroughly, paying special attention to the area where your  surgery  will be performed.  7.  Thoroughly rinse your body with warm water from the neck down.  8.  DO NOT shower/wash with your normal soap after using and rinsing off the CHG Soap.            9.  Pat yourself dry with a clean towel.            10.  Wear clean pajamas.            11.  Place clean sheets on your bed the night of your first shower and do not  sleep with pets.  ON THE DAY OF SURGERY : Do not apply any lotions/deodorants the morning of surgery.  Please wear clean clothes to the hospital/surgery center.     FAILURE TO FOLLOW THESE INSTRUCTIONS MAY RESULT IN THE CANCELLATION OF YOUR SURGERY  PATIENT SIGNATURE_________________________________  NURSE  SIGNATURE__________________________________  ________________________________________________________________________

## 2024-07-19 ENCOUNTER — Encounter (HOSPITAL_COMMUNITY): Payer: Self-pay

## 2024-07-19 ENCOUNTER — Other Ambulatory Visit: Payer: Self-pay

## 2024-07-19 ENCOUNTER — Telehealth: Payer: Self-pay | Admitting: *Deleted

## 2024-07-19 ENCOUNTER — Encounter (HOSPITAL_COMMUNITY)
Admission: RE | Admit: 2024-07-19 | Discharge: 2024-07-19 | Disposition: A | Discharge status: Home / Self Care | Source: Ambulatory Visit | Attending: Gynecologic Oncology | Admitting: Gynecologic Oncology

## 2024-07-19 ENCOUNTER — Other Ambulatory Visit: Payer: Self-pay | Admitting: Obstetrics

## 2024-07-19 VITALS — BP 129/60 | HR 91 | Temp 98.3°F | Resp 16 | Ht 63.0 in | Wt 174.0 lb

## 2024-07-19 DIAGNOSIS — C541 Malignant neoplasm of endometrium: Secondary | ICD-10-CM | POA: Diagnosis not present

## 2024-07-19 DIAGNOSIS — Z01812 Encounter for preprocedural laboratory examination: Secondary | ICD-10-CM | POA: Diagnosis not present

## 2024-07-19 DIAGNOSIS — Z794 Long term (current) use of insulin: Secondary | ICD-10-CM | POA: Diagnosis not present

## 2024-07-19 DIAGNOSIS — N1832 Chronic kidney disease, stage 3b: Secondary | ICD-10-CM | POA: Insufficient documentation

## 2024-07-19 DIAGNOSIS — E1122 Type 2 diabetes mellitus with diabetic chronic kidney disease: Secondary | ICD-10-CM | POA: Insufficient documentation

## 2024-07-19 DIAGNOSIS — Z01818 Encounter for other preprocedural examination: Secondary | ICD-10-CM | POA: Diagnosis present

## 2024-07-19 HISTORY — DX: Malignant (primary) neoplasm, unspecified: C80.1

## 2024-07-19 LAB — CBC
HCT: 32.7 % — ABNORMAL LOW (ref 36.0–46.0)
Hemoglobin: 10.1 g/dL — ABNORMAL LOW (ref 12.0–15.0)
MCH: 30.5 pg (ref 26.0–34.0)
MCHC: 30.9 g/dL (ref 30.0–36.0)
MCV: 98.8 fL (ref 80.0–100.0)
Platelets: 334 K/uL (ref 150–400)
RBC: 3.31 MIL/uL — ABNORMAL LOW (ref 3.87–5.11)
RDW: 13 % (ref 11.5–15.5)
WBC: 7.9 K/uL (ref 4.0–10.5)
nRBC: 0 % (ref 0.0–0.2)

## 2024-07-19 LAB — COMPREHENSIVE METABOLIC PANEL WITH GFR
ALT: 17 U/L (ref 0–44)
AST: 25 U/L (ref 15–41)
Albumin: 4 g/dL (ref 3.5–5.0)
Alkaline Phosphatase: 72 U/L (ref 38–126)
Anion gap: 10 (ref 5–15)
BUN: 15 mg/dL (ref 8–23)
CO2: 24 mmol/L (ref 22–32)
Calcium: 9.9 mg/dL (ref 8.9–10.3)
Chloride: 102 mmol/L (ref 98–111)
Creatinine, Ser: 1.18 mg/dL — ABNORMAL HIGH (ref 0.44–1.00)
GFR, Estimated: 47 mL/min — ABNORMAL LOW (ref >60)
Glucose, Bld: 254 mg/dL — ABNORMAL HIGH (ref 70–99)
Potassium: 4.8 mmol/L (ref 3.5–5.1)
Sodium: 136 mmol/L (ref 135–145)
Total Bilirubin: 0.5 mg/dL (ref 0.0–1.2)
Total Protein: 7.1 g/dL (ref 6.5–8.1)

## 2024-07-19 LAB — HEMOGLOBIN A1C
Hgb A1c MFr Bld: 7.1 % — ABNORMAL HIGH (ref 4.8–5.6)
Mean Plasma Glucose: 157.07 mg/dL

## 2024-07-19 LAB — NO BLOOD PRODUCTS

## 2024-07-19 NOTE — Telephone Encounter (Signed)
 Attempted to reach patient for pre-op call. Left voicemail requesting call back.

## 2024-07-19 NOTE — Telephone Encounter (Signed)
Telephone call to check on pre-operative status.  Patient compliant with pre-operative instructions.  Reinforced nothing to eat after midnight. Clear liquids until 0945. Patient to arrive at 1045.  No questions or concerns voiced.  Instructed to call for any needs.

## 2024-07-19 NOTE — Progress Notes (Signed)
 Notification sent to Dr. LOIS Dollar that patient refuses blood products.

## 2024-07-19 NOTE — Telephone Encounter (Signed)
 Received PCP clearance.

## 2024-07-19 NOTE — Progress Notes (Signed)
 COVID Vaccine received:  []  No [x]  Yes Date of any COVID positive Test in last 90 days: no PCP - Dr. Debby Molt in [facility] Cardiologist - no  Chest x-ray -  EKG -   Stress Test -  ECHO -  Cardiac Cath -   Bowel Prep - [x]  No  []   Yes ______  Pacemaker / ICD device [x]  No []  Yes   Spinal Cord Stimulator:[x]  No []  Yes       History of Sleep Apnea? [x]  No []  Yes   CPAP used?- [x]  No []  Yes    Does the patient monitor blood sugar?          []  No [x]  Yes  []  N/A  Patient has: []  NO Hx DM   []  Pre-DM                 []  DM1  [x]   DM2 Does patient have a [employer] or Dexacom? []  No [x]  Yes   Fasting Blood Sugar Ranges- 120's-180's Checks Blood Sugar _____ times a day  GLP1 agonist / usual dose - Ozempic  Last dose 07/04/24 GLP1 instructions:  SGLT-2 inhibitors / usual dose -  SGLT-2 instructions:   Blood Thinner / Instructions:no Aspirin  Instructions:ASA 81 mg  Can continue taking through surgery per MD  Comments:   Activity level: Patient is able  to climb a flight of stairs without difficulty; [x]  No CP  [x]  No SOB,    Patient can perform ADLs without assistance.   Anesthesia review:   Patient denies shortness of breath, fever, cough and chest pain at PAT appointment.  Patient verbalized understanding and agreement to the Pre-Surgical Instructions that were given to them at this PAT appointment. Patient was also educated of the need to review these PAT instructions again prior to his/her surgery.I reviewed the appropriate phone numbers to call if they have any and questions or concerns.

## 2024-07-20 ENCOUNTER — Encounter (HOSPITAL_COMMUNITY): Payer: Self-pay | Admitting: Gynecologic Oncology

## 2024-07-20 ENCOUNTER — Ambulatory Visit (HOSPITAL_BASED_OUTPATIENT_CLINIC_OR_DEPARTMENT_OTHER): Admitting: Anesthesiology

## 2024-07-20 ENCOUNTER — Ambulatory Visit (HOSPITAL_COMMUNITY)
Admission: RE | Admit: 2024-07-20 | Discharge: 2024-07-20 | Disposition: A | Discharge status: Home / Self Care | Attending: Gynecologic Oncology | Admitting: Gynecologic Oncology

## 2024-07-20 ENCOUNTER — Encounter (HOSPITAL_COMMUNITY): Admitting: Medical

## 2024-07-20 ENCOUNTER — Encounter (HOSPITAL_COMMUNITY)
Admission: RE | Disposition: A | Discharge status: Home / Self Care | Payer: Self-pay | Source: Home / Self Care | Attending: Gynecologic Oncology

## 2024-07-20 DIAGNOSIS — K5900 Constipation, unspecified: Secondary | ICD-10-CM | POA: Insufficient documentation

## 2024-07-20 DIAGNOSIS — K219 Gastro-esophageal reflux disease without esophagitis: Secondary | ICD-10-CM | POA: Diagnosis not present

## 2024-07-20 DIAGNOSIS — Z7985 Long-term (current) use of injectable non-insulin antidiabetic drugs: Secondary | ICD-10-CM | POA: Diagnosis not present

## 2024-07-20 DIAGNOSIS — E119 Type 2 diabetes mellitus without complications: Secondary | ICD-10-CM | POA: Diagnosis not present

## 2024-07-20 DIAGNOSIS — I10 Essential (primary) hypertension: Secondary | ICD-10-CM

## 2024-07-20 DIAGNOSIS — N812 Incomplete uterovaginal prolapse: Secondary | ICD-10-CM | POA: Diagnosis not present

## 2024-07-20 DIAGNOSIS — Z794 Long term (current) use of insulin: Secondary | ICD-10-CM | POA: Insufficient documentation

## 2024-07-20 DIAGNOSIS — I129 Hypertensive chronic kidney disease with stage 1 through stage 4 chronic kidney disease, or unspecified chronic kidney disease: Secondary | ICD-10-CM | POA: Diagnosis not present

## 2024-07-20 DIAGNOSIS — C541 Malignant neoplasm of endometrium: Secondary | ICD-10-CM

## 2024-07-20 DIAGNOSIS — N3000 Acute cystitis without hematuria: Secondary | ICD-10-CM | POA: Diagnosis not present

## 2024-07-20 DIAGNOSIS — E669 Obesity, unspecified: Secondary | ICD-10-CM | POA: Insufficient documentation

## 2024-07-20 DIAGNOSIS — N816 Rectocele: Secondary | ICD-10-CM

## 2024-07-20 DIAGNOSIS — N1832 Chronic kidney disease, stage 3b: Secondary | ICD-10-CM | POA: Insufficient documentation

## 2024-07-20 DIAGNOSIS — Z6830 Body mass index (BMI) 30.0-30.9, adult: Secondary | ICD-10-CM | POA: Insufficient documentation

## 2024-07-20 DIAGNOSIS — R339 Retention of urine, unspecified: Secondary | ICD-10-CM | POA: Diagnosis not present

## 2024-07-20 DIAGNOSIS — N329 Bladder disorder, unspecified: Secondary | ICD-10-CM

## 2024-07-20 DIAGNOSIS — Z79899 Other long term (current) drug therapy: Secondary | ICD-10-CM | POA: Insufficient documentation

## 2024-07-20 DIAGNOSIS — E1122 Type 2 diabetes mellitus with diabetic chronic kidney disease: Secondary | ICD-10-CM | POA: Diagnosis not present

## 2024-07-20 HISTORY — PX: ROBOTIC ASSISTED TOTAL HYSTERECTOMY WITH BILATERAL SALPINGO OOPHERECTOMY: SHX6086

## 2024-07-20 HISTORY — PX: ABDOMINAL HYSTERECTOMY: SHX81

## 2024-07-20 HISTORY — PX: CYSTOSCOPY: SHX5120

## 2024-07-20 HISTORY — PX: ANTERIOR AND POSTERIOR REPAIR: SHX5121

## 2024-07-20 HISTORY — PX: INJECTION, FOR SENTINEL LYMPH NODE IDENTIFICATION: SHX7598

## 2024-07-20 HISTORY — PX: LYMPH NODE BIOPSY: SHX201

## 2024-07-20 HISTORY — PX: UTEROSACRAL LIGAMENT SUSPENSION: SHX7698

## 2024-07-20 HISTORY — PX: LAPAROSCOPY, OMENTECTOMY: SHX7695

## 2024-07-20 LAB — GLUCOSE, CAPILLARY
Glucose-Capillary: 168 mg/dL — ABNORMAL HIGH (ref 70–99)
Glucose-Capillary: 208 mg/dL — ABNORMAL HIGH (ref 70–99)

## 2024-07-20 SURGERY — HYSTERECTOMY, TOTAL, ROBOT-ASSISTED, LAPAROSCOPIC, WITH BILATERAL SALPINGO-OOPHORECTOMY
Anesthesia: General | Site: Bladder

## 2024-07-20 MED ORDER — PHENAZOPYRIDINE HCL 200 MG PO TABS: 200.0000 mg | ORAL_TABLET | Freq: Once | ORAL | Status: AC

## 2024-07-20 MED ORDER — ONDANSETRON HCL 4 MG/2ML IJ SOLN
INTRAMUSCULAR | Status: AC
  Filled 2024-07-20: qty 2

## 2024-07-20 MED ORDER — EPHEDRINE SULFATE (PRESSORS) 50 MG/ML IJ SOLN
INTRAMUSCULAR | Status: DC | PRN
  Administered 2024-07-20: 10 mg via INTRAVENOUS

## 2024-07-20 MED ORDER — FENTANYL CITRATE PF 50 MCG/ML IJ SOSY
PREFILLED_SYRINGE | INTRAMUSCULAR | Status: AC
  Filled 2024-07-20: qty 1

## 2024-07-20 MED ORDER — INSULIN ASPART 100 UNIT/ML IJ SOLN: 0.0000 [IU] | INTRAMUSCULAR | Status: DC | PRN

## 2024-07-20 MED ORDER — ORAL CARE MOUTH RINSE
15.0000 mL | Freq: Once | OROMUCOSAL | Status: AC
  Administered 2024-07-20: 15 mL via OROMUCOSAL

## 2024-07-20 MED ORDER — PHENYLEPHRINE HCL-NACL 20-0.9 MG/250ML-% IV SOLN
INTRAVENOUS | Status: AC
  Filled 2024-07-20: qty 250

## 2024-07-20 MED ORDER — AMISULPRIDE (ANTIEMETIC) 5 MG/2ML IV SOLN
10.0000 mg | Freq: Once | INTRAVENOUS | Status: AC
  Administered 2024-07-20: 10 mg via INTRAVENOUS

## 2024-07-20 MED ORDER — OXYCODONE HCL 5 MG PO TABS
5.0000 mg | ORAL_TABLET | Freq: Once | ORAL | Status: AC | PRN
  Administered 2024-07-20: 5 mg via ORAL

## 2024-07-20 MED ORDER — PHENYLEPHRINE HCL-NACL 20-0.9 MG/250ML-% IV SOLN
INTRAVENOUS | Status: DC | PRN
  Administered 2024-07-20: 25 ug/min via INTRAVENOUS

## 2024-07-20 MED ORDER — CEFAZOLIN SODIUM-DEXTROSE 2-4 GM/100ML-% IV SOLN
2.0000 g | INTRAVENOUS | Status: AC
  Administered 2024-07-20: 2 g via INTRAVENOUS
  Filled 2024-07-20: qty 100

## 2024-07-20 MED ORDER — BUPIVACAINE HCL 0.25 % IJ SOLN
INTRAMUSCULAR | Status: DC | PRN
  Administered 2024-07-20: 30 mL

## 2024-07-20 MED ORDER — STERILE WATER FOR INJECTION IJ SOLN
INTRAMUSCULAR | Status: AC
  Filled 2024-07-20: qty 10

## 2024-07-20 MED ORDER — LIDOCAINE HCL (CARDIAC) PF 100 MG/5ML IV SOSY
PREFILLED_SYRINGE | INTRAVENOUS | Status: DC | PRN
  Administered 2024-07-20: 100 mg via INTRAVENOUS

## 2024-07-20 MED ORDER — ONDANSETRON HCL 4 MG/2ML IJ SOLN
4.0000 mg | Freq: Once | INTRAMUSCULAR | Status: AC | PRN
  Administered 2024-07-20: 4 mg via INTRAVENOUS

## 2024-07-20 MED ORDER — HEPARIN SODIUM (PORCINE) 5000 UNIT/ML IJ SOLN
5000.0000 [IU] | INTRAMUSCULAR | Status: AC
  Administered 2024-07-20: 5000 [IU] via SUBCUTANEOUS
  Filled 2024-07-20: qty 1

## 2024-07-20 MED ORDER — CHLORHEXIDINE GLUCONATE 0.12 % MT SOLN: 15.0000 mL | Freq: Once | OROMUCOSAL | Status: AC

## 2024-07-20 MED ORDER — PROPOFOL 10 MG/ML IV BOLUS
INTRAVENOUS | Status: AC
  Filled 2024-07-20: qty 20

## 2024-07-20 MED ORDER — ACETAMINOPHEN 500 MG PO TABS: 1000.0000 mg | ORAL_TABLET | Freq: Once | ORAL | Status: DC

## 2024-07-20 MED ORDER — ROCURONIUM BROMIDE 100 MG/10ML IV SOLN
INTRAVENOUS | Status: DC | PRN
  Administered 2024-07-20: 80 mg via INTRAVENOUS

## 2024-07-20 MED ORDER — ACETAMINOPHEN 500 MG PO TABS
1000.0000 mg | ORAL_TABLET | ORAL | Status: AC
  Administered 2024-07-20: 1000 mg via ORAL
  Filled 2024-07-20: qty 2

## 2024-07-20 MED ORDER — STERILE WATER FOR INJECTION IJ SOLN
INTRAMUSCULAR | Status: DC | PRN
  Administered 2024-07-20: 4 mL via INTRAVENOUS

## 2024-07-20 MED ORDER — OXYCODONE HCL 5 MG PO TABS
ORAL_TABLET | ORAL | Status: AC
  Filled 2024-07-20: qty 1

## 2024-07-20 MED ORDER — FENTANYL CITRATE (PF) 100 MCG/2ML IJ SOLN
INTRAMUSCULAR | Status: DC | PRN
  Administered 2024-07-20 (×5): 50 ug via INTRAVENOUS

## 2024-07-20 MED ORDER — STERILE WATER FOR IRRIGATION IR SOLN
Status: DC | PRN
  Administered 2024-07-20: 3000 mL

## 2024-07-20 MED ORDER — SUGAMMADEX SODIUM 200 MG/2ML IV SOLN
INTRAVENOUS | Status: DC | PRN
Start: 2024-07-20 — End: 2024-07-20
  Administered 2024-07-20: 200 mg via INTRAVENOUS

## 2024-07-20 MED ORDER — FENTANYL CITRATE (PF) 250 MCG/5ML IJ SOLN
INTRAMUSCULAR | Status: AC
  Filled 2024-07-20: qty 5

## 2024-07-20 MED ORDER — LIDOCAINE-EPINEPHRINE 1 %-1:100000 IJ SOLN
INTRAMUSCULAR | Status: AC
  Filled 2024-07-20: qty 1

## 2024-07-20 MED ORDER — ONDANSETRON HCL 4 MG/2ML IJ SOLN
INTRAMUSCULAR | Status: AC
  Filled 2024-07-20: qty 4

## 2024-07-20 MED ORDER — LIDOCAINE-EPINEPHRINE 1 %-1:100000 IJ SOLN
INTRAMUSCULAR | Status: DC | PRN
  Administered 2024-07-20: 10 mL
  Administered 2024-07-20: 20 mL

## 2024-07-20 MED ORDER — DEXAMETHASONE SODIUM PHOSPHATE 10 MG/ML IJ SOLN
4.0000 mg | INTRAMUSCULAR | Status: AC
  Administered 2024-07-20: 4 mg via INTRAVENOUS

## 2024-07-20 MED ORDER — STERILE WATER FOR INJECTION IJ SOLN
INTRAMUSCULAR | Status: DC | PRN
  Administered 2024-07-20: 10 mL

## 2024-07-20 MED ORDER — LACTATED RINGERS IV SOLN: INTRAVENOUS | Status: DC | PRN

## 2024-07-20 MED ORDER — LACTATED RINGERS IV SOLN
INTRAVENOUS | Status: DC | PRN
Start: 2024-07-20 — End: 2024-07-20

## 2024-07-20 MED ORDER — LACTATED RINGERS IV SOLN: INTRAVENOUS | Status: DC

## 2024-07-20 MED ORDER — FENTANYL CITRATE PF 50 MCG/ML IJ SOSY
25.0000 ug | PREFILLED_SYRINGE | INTRAMUSCULAR | Status: DC | PRN
  Administered 2024-07-20 (×2): 50 ug via INTRAVENOUS

## 2024-07-20 MED ORDER — DEXAMETHASONE SODIUM PHOSPHATE 10 MG/ML IJ SOLN
INTRAMUSCULAR | Status: AC
Start: 2024-07-20 — End: 2024-07-20
  Filled 2024-07-20: qty 2

## 2024-07-20 MED ORDER — PHENAZOPYRIDINE HCL 200 MG PO TABS
ORAL_TABLET | ORAL | Status: AC
  Administered 2024-07-20: 200 mg via ORAL
  Filled 2024-07-20: qty 1

## 2024-07-20 MED ORDER — ONDANSETRON HCL 4 MG/2ML IJ SOLN
INTRAMUSCULAR | Status: DC | PRN
  Administered 2024-07-20: 4 mg via INTRAVENOUS

## 2024-07-20 MED ORDER — AMISULPRIDE (ANTIEMETIC) 5 MG/2ML IV SOLN
INTRAVENOUS | Status: AC
  Filled 2024-07-20: qty 4

## 2024-07-20 MED ORDER — LACTATED RINGERS IR SOLN
Status: DC | PRN
  Administered 2024-07-20: 1000 mL

## 2024-07-20 MED ORDER — BUPIVACAINE HCL (PF) 0.25 % IJ SOLN
INTRAMUSCULAR | Status: AC
  Filled 2024-07-20: qty 30

## 2024-07-20 MED ORDER — OXYCODONE HCL 5 MG/5ML PO SOLN: 5.0000 mg | Freq: Once | ORAL | Status: AC | PRN

## 2024-07-20 MED ORDER — PROPOFOL 10 MG/ML IV BOLUS
INTRAVENOUS | Status: DC | PRN
  Administered 2024-07-20: 150 mg via INTRAVENOUS

## 2024-07-20 SURGICAL SUPPLY — 77 items
APPLICATOR SURGIFLO ENDO (HEMOSTASIS) IMPLANT
BAG COUNTER SPONGE SURGICOUNT (BAG) IMPLANT
BAG LAPAROSCOPIC 12 15 PORT 16 (BASKET) IMPLANT
BLADE SURG SZ10 CARB STEEL (BLADE) IMPLANT
COVER BACK TABLE 60X90IN (DRAPES) ×4 IMPLANT
COVER TIP SHEARS 8 DVNC (MISCELLANEOUS) ×4 IMPLANT
DERMABOND ADVANCED .7 DNX12 (GAUZE/BANDAGES/DRESSINGS) ×4 IMPLANT
DRAPE ARM DVNC X/XI (DISPOSABLE) ×16 IMPLANT
DRAPE COLUMN DVNC XI (DISPOSABLE) ×4 IMPLANT
DRAPE SHEET LG 3/4 BI-LAMINATE (DRAPES) ×4 IMPLANT
DRAPE SURG IRRIG POUCH 19X23 (DRAPES) ×4 IMPLANT
DRIVER NDL MEGA SUTCUT DVNCXI (INSTRUMENTS) ×4 IMPLANT
DRIVER NDLE MEGA SUTCUT DVNCXI (INSTRUMENTS) ×4 IMPLANT
DRSG OPSITE POSTOP 4X6 (GAUZE/BANDAGES/DRESSINGS) IMPLANT
DRSG OPSITE POSTOP 4X8 (GAUZE/BANDAGES/DRESSINGS) IMPLANT
ELECT PENCIL ROCKER SW 15FT (MISCELLANEOUS) IMPLANT
ELECT REM PT RETURN 15FT ADLT (MISCELLANEOUS) ×4 IMPLANT
FORCEPS BPLR FENES DVNC XI (FORCEP) ×4 IMPLANT
FORCEPS PROGRASP DVNC XI (FORCEP) ×4 IMPLANT
GAUZE 4X4 16PLY ~~LOC~~+RFID DBL (SPONGE) ×8 IMPLANT
GLOVE BIO SURGEON STRL SZ 6 (GLOVE) ×16 IMPLANT
GLOVE BIO SURGEON STRL SZ 6.5 (GLOVE) ×4 IMPLANT
GOWN STRL REUS W/ TWL LRG LVL3 (GOWN DISPOSABLE) ×16 IMPLANT
GRASPER SUT TROCAR 14GX15 (MISCELLANEOUS) IMPLANT
HOLDER FOLEY CATH W/STRAP (MISCELLANEOUS) IMPLANT
HOOK RETRACTION 12 ELAST STAY (MISCELLANEOUS) IMPLANT
IRRIGATION SUCT STRKRFLW 2 WTP (MISCELLANEOUS) ×4 IMPLANT
KIT PROCEDURE DVNC SI (MISCELLANEOUS) IMPLANT
KIT TURNOVER KIT A (KITS) ×4 IMPLANT
LIGASURE IMPACT 36 18CM CVD LR (INSTRUMENTS) IMPLANT
LUBRICANT JELLY K Y 4OZ (MISCELLANEOUS) IMPLANT
MANIPULATOR ADVINCU DEL 3.0 PL (MISCELLANEOUS) IMPLANT
MANIPULATOR ADVINCU DEL 3.5 PL (MISCELLANEOUS) IMPLANT
MANIPULATOR UTERINE 4.5 ZUMI (MISCELLANEOUS) IMPLANT
NDL HYPO 21X1.5 SAFETY (NEEDLE) ×4 IMPLANT
NDL SPNL 18GX3.5 QUINCKE PK (NEEDLE) IMPLANT
NEEDLE HYPO 21X1.5 SAFETY (NEEDLE) ×8 IMPLANT
NEEDLE SPNL 18GX3.5 QUINCKE PK (NEEDLE) ×4 IMPLANT
OBTURATOR OPTICALSTD 8 DVNC (TROCAR) ×4 IMPLANT
PACK ROBOT GYN CUSTOM WL (TRAY / TRAY PROCEDURE) ×4 IMPLANT
PAD POSITIONING PINK XL (MISCELLANEOUS) ×4 IMPLANT
PORT ACCESS TROCAR AIRSEAL 12 (TROCAR) IMPLANT
RETRACTOR LONE STAR DISPOSABLE (INSTRUMENTS) IMPLANT
SCISSORS LAP 5X45 EPIX DISP (ENDOMECHANICALS) IMPLANT
SCISSORS MNPLR CVD DVNC XI (INSTRUMENTS) ×4 IMPLANT
SCRUB CHG 4% DYNA-HEX 4OZ (MISCELLANEOUS) IMPLANT
SEAL UNIV 5-12 XI (MISCELLANEOUS) ×16 IMPLANT
SET IRRIG Y TYPE TUR BLADDER L (SET/KITS/TRAYS/PACK) IMPLANT
SET TRI-LUMEN FLTR TB AIRSEAL (TUBING) ×4 IMPLANT
SPIKE FLUID TRANSFER (MISCELLANEOUS) ×4 IMPLANT
SPONGE T-LAP 18X18 ~~LOC~~+RFID (SPONGE) IMPLANT
SURGIFLO W/THROMBIN 8M KIT (HEMOSTASIS) IMPLANT
SUT MNCRL AB 4-0 PS2 18 (SUTURE) IMPLANT
SUT PDS AB 1 TP1 96 (SUTURE) IMPLANT
SUT STRATA PDS 0 30 CT-2.5 (SUTURE) IMPLANT
SUT V-LOC 180 0-0 GS22 (SUTURE) IMPLANT
SUT VIC AB 0 CT1 27XBRD ANTBC (SUTURE) IMPLANT
SUT VIC AB 2-0 CT1 TAPERPNT 27 (SUTURE) IMPLANT
SUT VIC AB 2-0 SH 27X BRD (SUTURE) IMPLANT
SUT VIC AB 2-0 SH 27XBRD (SUTURE) IMPLANT
SUT VIC AB 4-0 PS2 18 (SUTURE) ×8 IMPLANT
SUT VICRYL 0 27 CT2 27 ABS (SUTURE) ×4 IMPLANT
SUT VICRYL 0 UR6 27IN ABS (SUTURE) IMPLANT
SUT VLOC 180 0 9IN GS21 (SUTURE) IMPLANT
SUTURE STRATFX 0 PDS+ CT-2 23 (SUTURE) IMPLANT
SYR 10ML LL (SYRINGE) IMPLANT
SYR CONTROL 10ML LL (SYRINGE) IMPLANT
SYSTEM BAG RETRIEVAL 10MM (BASKET) IMPLANT
SYSTEM RETRIEVL 5MM INZII UNIV (BASKET) IMPLANT
SYSTEM WOUND ALEXIS 18CM MED (MISCELLANEOUS) IMPLANT
TRAP SPECIMEN MUCUS 40CC (MISCELLANEOUS) IMPLANT
TRAY FOLEY MTR SLVR 16FR STAT (SET/KITS/TRAYS/PACK) ×4 IMPLANT
TROCAR PORT AIRSEAL 5X120 (TROCAR) IMPLANT
TROCAR XCEL NON-BLD 5MMX100MML (ENDOMECHANICALS) ×4 IMPLANT
UNDERPAD 30X36 HEAVY ABSORB (UNDERPADS AND DIAPERS) ×8 IMPLANT
WATER STERILE IRR 1000ML POUR (IV SOLUTION) ×4 IMPLANT
YANKAUER SUCT BULB TIP 10FT TU (MISCELLANEOUS) IMPLANT

## 2024-07-20 NOTE — Interval H&P Note (Signed)
 History and Physical Interval Note:  07/20/2024 2:09 PM  Bridget Butler  has presented today for surgery, with the diagnosis of Endometrial cancer.  The various methods of treatment have been discussed with the patient and family. After consideration of risks, benefits and other options for treatment, the patient has consented to  Procedure(s) with comments: HYSTERECTOMY, TOTAL, ROBOT-ASSISTED, LAPAROSCOPIC, WITH BILATERAL SALPINGO-OOPHORECTOMY (Bilateral) INJECTION, FOR SENTINEL LYMPH NODE IDENTIFICATION (N/A) LYMPH NODE BIOPSY (N/A) LYMPHADENECTOMY, PELVIS (N/A) LAPAROSCOPY, OMENTECTOMY (N/A) UTEROSACRAL LIGAMENT SUSPENSION (N/A) CYSTOSCOPY (N/A) ANTERIOR (CYSTOCELE) AND POSTERIOR REPAIR (RECTOCELE) (N/A) - possible posterior repair as a surgical intervention.  The patient's history has been reviewed, patient examined, no change in status, stable for surgery.  I have reviewed the patient's chart and labs.  Questions were answered to the patient's satisfaction.     [name]

## 2024-07-20 NOTE — Interval H&P Note (Signed)
 History and Physical Interval Note:  07/20/2024 2:00 PM  Bridget Butler  has presented today for surgery, with the diagnosis of Endometrial cancer.  The various methods of treatment have been discussed with the patient and family. After consideration of risks, benefits and other options for treatment, the patient has consented to  Procedure(s) with comments: HYSTERECTOMY, TOTAL, ROBOT-ASSISTED, LAPAROSCOPIC, WITH BILATERAL SALPINGO-OOPHORECTOMY (Bilateral) INJECTION, FOR SENTINEL LYMPH NODE IDENTIFICATION (N/A) LYMPH NODE BIOPSY (N/A) LYMPHADENECTOMY, PELVIS (N/A) LAPAROSCOPY, OMENTECTOMY (N/A) UTEROSACRAL LIGAMENT SUSPENSION (N/A) CYSTOSCOPY (N/A) ANTERIOR (CYSTOCELE) AND POSTERIOR REPAIR (RECTOCELE) (N/A) - possible posterior repair as a surgical intervention.  The patient's history has been reviewed, patient examined, no change in status, stable for surgery.  I have reviewed the patient's chart and labs.  Questions were answered to the patient's satisfaction.    Offered void trial postop, patient declined. Plan to repeat in the office next week.  Declines blood products.  Discussed possible need for additional treatments due to urinary incontinence or retention.  Encouraged continuous foley drainage for bladder rest due to concerns of overdistention.   Bridget Butler

## 2024-07-20 NOTE — Op Note (Addendum)
 Operative Note  Preoperative Diagnosis: Stage II pelvic organ prolapse, urinary retention  Postoperative Diagnosis: Stage II pelvic organ prolapse, urinary retention, bladder lesion  Procedures performed:   Exam under anesthesia, robotic assisted laparoscopic uterosacral ligament suspension, posterior repair, cystourethroscopy with bladder biopsy  Implants: * No implants in log *  Attending Surgeon: Lianne Leila Gillis MD  Assistant Surgeon: n/a  Assistant: n/a  Anesthesia: General endotracheal  Findings: 1. On vaginal exam, stage II prolapse present  2. On cystoscopy, erythematous bladder mucosa in and above trigone at midline at bladder dome with 2 biopsies taken at the bladder dome. Normal urethral mucosa without injury or lesion. Brisk bilateral ureteral efflux present.     Specimens: * No specimens in log *  Estimated blood loss: 25 mL (total 75mL)   IV fluids: 1500 mL  Urine output: 550 mL  Complications: none  Procedure in Detail: After informed consent was obtained, the patient was taken to the operating room where she was placed under anesthesia.  She was then placed in the dorsal lithotomy position with Allen stirrups and prepped and draped in the usual sterile fashion.  Care was taken to avoid hyperflexion or hyperextension of her lower extremities.  A foley catheter was placed. After laparoscopic ports were placed, 2 2-0 vicryl sutures were placed loosely at the uterosacral ligaments bilaterally for identification. Please refer to Dr. Lewie operative report regarding, robotic-assisted laparoscopic total hysterectomy with bilateral salpingoophorectomy, SLN biopsy bilaterally, and omentectomy. After completion of vaginal cuff closure, the right and left uterosacral ligaments were identified visually. Two stitches of 0 PDS were placed and tied down through each uterosacral ligament towards its insertion site at the sacrum through the vaginal cuff at the apex and midline  bilaterally, while traction was placed on the previously placed sutures.  The foley catheter was removed.  A 70-degree cystoscope was introduced, and 360-degree inspection revealed no trauma in the bladder, with bilateral ureteral efflux with tension on the uterosacral sutures.  Erythematous bladder mucosa was note at midline in and above the trigone. Two bladder biopsies were obtained with endoscopic cup forceps with a 30-degree cystoscopy. A Bugbee electrode was used for hemostasis at biopsy sites. No sutures, lesions, or foreign material noted. The distention fluid was drained and the cystoscope was removed. The Foley catheter was reinserted.     The robot was undocked with all laparoscopic instruments removed. Vaginal exam showed remaining posterior wall prolapse with resolution of anterior wall prolapse and a well suspended apex.    Attention was then turned to the posterior vagina.  A lonestar self-retraining retractor was placed with 4 stay hooks. Two Allis clamps were placed at the introitus approximately 2cm from the urethra meatus. Local anesthetic with 1% lidocaine  with epinephrine  was injected into the vaginal mucosa in the posterior vaginal wall and perineum for hydrodissection and hemostasis. A diamond shape incision was made between these clamps with an 11 blade scalpel and a diamond shaped area of epithelium was cut at the introitus.  The rectovaginal septum was then dissected off the vaginal mucosa bilaterally. The rectovaginal septum was then plicated in a continuous running fashion with 2-0 PDS while one finger was placed in the rectum to prevent rectal penetration.  After placement of the first plication stitch two fingers were inserted into the vaginal to confirm adequate caliber.  The suture incorporated the perineal body in a U stitch fashion and the bulbocavernosus muscles. A 2-0 Vicryl was used in a subcuticular fashion to re-approximate the hymenal  ring. After plication, the excess  vaginal mucosa was trimmed and the vaginal mucosa was reapproximated using 2-0 Vicryl sutures.  The vagina was copiously irrigated.  Hemostasis was noted. A rectal examination was normal and confirmed no sutures within the rectum. Three fingers passed through the vaginal opening without difficulty.   The patient tolerated the procedure well.  She was awakened from anesthesia and transferred to the recovery room in stable condition. All needle and sponge counts were correct x 2.

## 2024-07-20 NOTE — Discharge Instructions (Addendum)
 POST OPERATIVE INSTRUCTIONS  General Instructions Recovery (not bed rest) will last approximately 6 weeks Walking is encouraged, but refrain from strenuous exercise/ housework/ heavy lifting. No lifting >10lbs  Nothing in the vagina- NO intercourse, tampons or douching Bathing:  Do not submerge in water (NO swimming, bath, hot tub, etc) until after your postop visit. You can shower starting the day after surgery.  No driving until you are not taking narcotic pain medicine and until your pain is well enough controlled that you can slam on the breaks or make sudden movements if needed.   Taking your medications Please take your acetaminophen  and ibuprofen on a schedule for the first 48 hours. Take 600mg  ibuprofen, then take 500mg  acetaminophen  3 hours later, then continue to alternate ibuprofen and acetaminophen . That way you are taking each type of medication every 6 hours. Take the prescribed narcotic (oxycodone , tramadol , etc) as needed, with a maximum being every 4 hours.  Take a stool softener daily to keep your stools soft and preventing you from straining. If you have diarrhea, you decrease your stool softener. This is explained more below. We have prescribed you Miralax .  Reasons to Call the Nurse (see last page for phone numbers) Heavy Bleeding (changing your pad every 1-2 hours) Persistent nausea/vomiting Fever (100.4 degrees or more) Incision problems (pus or other fluid coming out, redness, warmth, increased pain)  Things to Expect After Surgery Mild to Moderate pain is normal during the first day or two after surgery. If prescribed, take Ibuprofen or Tylenol  first and use the stronger medicine for "break-through" pain. You can overlap these medicines because they work differently.   Constipation   To Prevent Constipation:  Eat a well-balanced diet including protein, grains, fresh fruit and vegetables.  Drink plenty of fluids. Walk regularly.  Depending on specific instructions  from your physician: take Miralax  daily and additionally you can add a stool softener (colace/ docusate) and fiber supplement. Continue as long as you're on pain medications.   To Treat Constipation:  If you do not have a bowel movement in 2 days after surgery, you can take 2 Tbs of Milk of Magnesia 1-2 times a day until you have a bowel movement. If diarrhea occurs, decrease the amount or stop the laxative. If no results with Milk of Magnesia, you can drink a bottle of magnesium citrate which you can purchase over the counter.  Fatigue:  This is a normal response to surgery and will improve with time.  Plan frequent rest periods throughout the day.  Gas Pain:  This is very common but can also be very painful! Drink warm liquids such as herbal teas, bouillon or soup. Walking will help you pass more gas.  Mylicon or Gas-X can be taken over the counter.  Leaking Urine:  Varying amounts of leakage may occur after surgery.  This should improve with time. Your bladder needs at least 3 months to recover from surgery. If you leak after surgery, be sure to mention this to your doctor at your post-op visit. If you were taking medications for overactive bladder prior to surgery, be sure to restart the medications immediately after surgery.  Incisions: If you have incisions on your abdomen, the skin glue will dissolve on its own over time. It is ok to gently rinse with soap and water over these incisions but do not scrub.  Catheter Approximately 50% of patients are unable to urinate after surgery and need to go home with a catheter. This allows your bladder to  rest so it can return to full function. If you go home with a catheter, the office will call to set up a voiding trial a few days after surgery. For most patients, by this visit, they are able to urinate on their own. Long term catheter use is rare.   Return to Work  As work demands and recovery times vary widely, it is hard to predict when you will want  to return to work. If you have a desk job with no strenuous physical activity, and if you would like to return sooner than generally recommended, discuss this with your provider or call our office.   Post op concerns  For non-emergent issues, please call the Urogynecology Nurse. Please leave a message and someone will contact you within one business day.  You can also send a message through MyChart.   AFTER HOURS (After 5:00 PM and on weekends):  For urgent matters that cannot wait until the next business day. Call our office [phone] and connect to the doctor on call.  Please reserve this for important issues.   **FOR ANY TRUE EMERGENCY ISSUES CALL 911 OR GO TO THE NEAREST EMERGENCY ROOM.** Please inform our office or the doctor on call of any emergency.     APPOINTMENTS: Call [phone]   Medications:  - Take ibuprofen and tylenol  first line for pain control. Take these regularly (every 6 hours) to decrease the build up of pain.  - If necessary, for severe pain not relieved by ibuprofen, take tramadol .  - While taking tramadol  you should take sennakot every night to reduce the likelihood of constipation. If this causes diarrhea, stop its use.  Diet: 1. Low sodium Heart Healthy Diet is recommended.  2. It is safe to use a laxative if you have difficulty moving your bowels.   Wound Care: 1. Keep clean and dry.  Shower daily.  Reasons to call the Doctor:  Fever - Oral temperature greater than 100.4 degrees Fahrenheit Foul-smelling vaginal discharge Difficulty urinating Nausea and vomiting Increased pain at the site of the incision that is unrelieved with pain medicine. Difficulty breathing with or without chest pain New calf pain especially if only on one side Sudden, continuing increased vaginal bleeding with or without clots.   Follow-up: 1. See Comer Dollar in 3 weeks. You will have a phone visit once pathology is back.  Contacts: For questions or concerns  you should contact:  Dr. Comer Dollar at [phone] After hours and on week-ends call [phone] and ask to speak to the physician on call for Gynecologic Oncology

## 2024-07-20 NOTE — Progress Notes (Signed)
 Pt vomited 6ooml emesis. Trigemini and PVC's occasionally. Temp. 96.7 temporal with bair hugger.  Dr. Lucious ordered barhemsys  for nausea, VS stable.

## 2024-07-20 NOTE — Anesthesia Procedure Notes (Signed)
 Procedure Name: Intubation Date/Time: 07/20/2024 3:11 PM  Performed by: Dartha Meckel, CRNAPre-anesthesia Checklist: Patient identified, Emergency Drugs available, Suction available and Patient being monitored Patient Re-evaluated:Patient Re-evaluated prior to induction Oxygen  Delivery Method: Circle system utilized Preoxygenation: Pre-oxygenation with 100% oxygen  Induction Type: IV induction Ventilation: Mask ventilation without difficulty Laryngoscope Size: Glidescope and 3 Grade View: Grade I Tube type: Oral Tube size: 7.0 mm Number of attempts: 1 Airway Equipment and Method: Stylet and Oral airway Placement Confirmation: ETT inserted through vocal cords under direct vision, positive ETCO2 and breath sounds checked- equal and bilateral Secured at: 21 cm Tube secured with: Tape Dental Injury: Teeth and Oropharynx as per pre-operative assessment

## 2024-07-20 NOTE — Op Note (Signed)
 OPERATIVE NOTE  Pre-operative Diagnosis: serous endometrial cancer  Post-operative Diagnosis: same  Operation: Robotic-assisted laparoscopic total hysterectomy with bilateral salpingoophorectomy, SLN biopsy bilaterally, infracolic omentectomy  Surgeon: Viktoria Crank MD  Assistant Surgeon: Olam Leonce Ada MD (an MD assistant was necessary for tissue manipulation, management of robotic instrumentation, retraction and positioning due to the complexity of the case and hospital policies).   Anesthesia: GET  Urine Output: see I&O flowsheet  Operative Findings: On EUA, 12 cm mobile uterus, cervix prolapsed to the introitus. On intra-abdominal entry, minimal adhesive disease from prior cholecystectomy. Normal appearing omentum, small and large bowel. Uterus 10 cm and boggy. Normal appearing bilateral tubes and ovaries. No ascites. Mapping successful to bilateral external iliac vein SLNs. On the right, two channels also noted within the obturator space although terminated at two lymph nodes that were not green (removed as obturator SLNs). No obvious extra-uterine evidence of disease. Bladder wall quite redundant.  Estimated Blood Loss:  50 cc from my portion of the surgery      Total IV Fluids: see I&O flowsheet         Specimens: uterus, cervix, bilateral tubes and ovaries, bilateral external iliac vein SLNs, right obturator SLN, pelvic washings, omentum         Complications:  None apparent; patient tolerated the procedure well.         Disposition: PACU - hemodynamically stable.  Procedure Details  The patient was seen in the Holding Room. The risks, benefits, complications, treatment options, and expected outcomes were discussed with the patient.  The patient concurred with the proposed plan, giving informed consent.  The site of surgery properly noted/marked. The patient was identified as Fish farm manager and the procedure verified as a Robotic-assisted hysterectomy with bilateral salpingo  oophorectomy with SLN biopsy.   After induction of anesthesia, the patient was draped and prepped in the usual sterile manner. Patient was placed in supine position after anesthesia and draped and prepped in the usual sterile manner as follows: Her arms were tucked to her side with all appropriate precautions.  The patient was secured to the bed using padding and tape across her chest.  The patient was placed in the semi-lithotomy position in Gerald stirrups.  The perineum and vagina were prepped with Betadine. The patient's abdomen was prepped with ChloraPrep and she was draped after the prep had been allowed to dry for 3 minutes.  A Time Out was held and the above information confirmed.  The urethra was prepped with Betadine. Foley catheter was placed.  A sterile speculum was placed in the vagina.  The cervix was grasped with a single-tooth tenaculum. 2mg  total of ICG was injected into the cervical stroma at 2 and 9 o'clock with 1cc injected at a 1cm and 2mm depth (concentration 0.5mg /ml) in all locations. The cervix was dilated with Fredirick dilators.  The ZUMI uterine manipulator with a medium colpotomizer ring was placed without difficulty.  A pneum occluder balloon was placed over the manipulator.  OG tube placement was confirmed and to suction.   Next, a 10 mm skin incision was made 1 cm below the subcostal margin in the midclavicular line.  The 5 mm Optiview port and scope was used for direct entry.  Opening pressure was under 10 mm CO2.  The abdomen was insufflated and the findings were noted as above.   At this point and all points during the procedure, the patient's intra-abdominal pressure did not exceed 15 mmHg. Next, an 8 mm skin incision was  made superior to the umbilicus and a right and left port were placed about 8 cm lateral to the robot port on the right and left side.  A fourth arm was placed on the right.  The 5 mm assist trocar was exchanged for a 10-12 mm port. All ports were placed under  direct visualization.  The patient was placed in steep Trendelenburg.  Bowel was folded away into the upper abdomen.  The robot was docked in the normal manner.  Pelvic washings were collected. During initial positioning of the uterus, the Zumi broke just distal to the KOH ring. The Zumi manipulator was removed and a 3.5 Advincula manipulator was placed. The right and left peritoneum were opened parallel to the IP ligament to open the retroperitoneal spaces bilaterally. The round ligaments were transected. The SLN mapping was performed in bilateral pelvic basins. After identifying the ureters, the para rectal and paravesical spaces were opened up entirely with careful dissection below the level of the ureters bilaterally and to the depth of the uterine artery origin in order to skeletonize the uterine web and ensure visualization of all parametrial channels. The para-aortic basins were carefully exposed and evaluated for isolated para-aortic SLN's. Lymphatic channels were identified travelling to the following visualized sentinel lymph node's: bilateral external iliac SLNs, right obturator (channel going to two nodes but neither green, both removed). These SLNs were separated from their surrounding lymphatic tissue, removed and sent for permanent pathology.  The hysterectomy was started.  The ureter was again noted to be on the medial leaf of the broad ligament.  The peritoneum above the ureter was incised and stretched and the infundibulopelvic ligament was skeletonized, cauterized and cut.  The posterior peritoneum was taken down to the level of the KOH ring.  The anterior peritoneum was also taken down.  The bladder flap was created to the level of the KOH ring.  The uterine artery on the right side was skeletonized, cauterized and cut in the normal manner.  A similar procedure was performed on the left.  The colpotomy was made and the uterus, cervix, bilateral ovaries and tubes were amputated and delivered  through the vagina.  Pedicles were inspected and excellent hemostasis was achieved.    The colpotomy at the vaginal cuff was closed with 0 Vicryl with a figure of eight at each apex and 0 V-Loc to close the midportion of the cuff in two layers in a running fashion.  Irrigation was used and excellent hemostasis was achieved.  At this point in the procedure was completed.    The procedure was then turned over to Dr. Guadlupe for cystoscopy and uterosacral ligament suspension. When Dr. Guadlupe turned her attention vaginally again, robotic instruments were removed under direct visulaization.  The robot was undocked. The fascia at the 12 mm port was closed with 0 Vicryl using a PMI fascial closure device under direct visualization.  The subcuticular tissue was closed with 4-0 Vicryl and the skin was closed with 4-0 Monocryl in a subcuticular manner.  Dermabond was applied.    All sponge, lap and needle counts were correct x  3.   The case was turned over to Dr. Guadlupe again.  Comer Dollar, MD

## 2024-07-20 NOTE — Transfer of Care (Signed)
 Immediate Anesthesia Transfer of Care Note  Patient: Bridget Butler  Procedure(s) Performed: HYSTERECTOMY, TOTAL, ROBOT-ASSISTED, LAPAROSCOPIC, WITH BILATERAL SALPINGO-OOPHORECTOMY (Bilateral: Abdomen) INJECTION, FOR SENTINEL LYMPH NODE IDENTIFICATION (Abdomen) LYMPH NODE BIOPSY (Abdomen) LAPAROSCOPY, OMENTECTOMY (Abdomen) UTEROSACRAL LIGAMENT SUSPENSION CYSTOSCOPY (Bladder) POSTERIOR REPAIR (RECTOCELE) (Bladder)  Patient Location: PACU  Anesthesia Type:General  Level of Consciousness: drowsy and patient cooperative  Airway & Oxygen  Therapy: Patient Spontanous Breathing and Patient connected to face mask oxygen   Post-op Assessment: Report given to RN and Post -op Vital signs reviewed and stable  Post vital signs: Reviewed and stable  Last Vitals:  Vitals Value Taken Time  BP 130/56 07/20/24 19:00  Temp    Pulse 73 07/20/24 19:06  Resp 15 07/20/24 19:06  SpO2 100 % 07/20/24 19:06  Vitals shown include unfiled device data.  Last Pain:  Vitals:   07/20/24 1149  TempSrc:   PainSc: 0-No pain         Complications: No notable events documented.

## 2024-07-20 NOTE — Anesthesia Preprocedure Evaluation (Addendum)
 Anesthesia Evaluation  Patient identified by MRN, date of birth, ID band Patient awake    Reviewed: Allergy & Precautions, NPO status , Patient's Chart, lab work & pertinent test results  History of Anesthesia Complications Negative for: history of anesthetic complications  Airway Mallampati: II  TM Distance: >3 FB Neck ROM: Full    Dental  (+) Dental Advisory Given, Upper Dentures   Pulmonary   Hx tracheostomy    Pulmonary exam normal        Cardiovascular hypertension, Pt. on medications Normal cardiovascular exam     Neuro/Psych negative neurological ROS  negative psych ROS   GI/Hepatic Neg liver ROS,GERD  Medicated,,  Endo/Other  diabetes, Type 2, Insulin  Dependent   Obesity On GLP-1a   Renal/GU CRFRenal disease Bladder dysfunction      Musculoskeletal negative musculoskeletal ROS (+)    Abdominal   Peds  Hematology  (+) Blood dyscrasia, anemia , REFUSES BLOOD PRODUCTS  Anesthesia Other Findings   Reproductive/Obstetrics                              Anesthesia Physical Anesthesia Plan  ASA: 3  Anesthesia Plan: General   Post-op Pain Management: Tylenol  PO (pre-op)*   Induction: Intravenous  PONV Risk Score and Plan: 3 and Treatment may vary due to age or medical condition, Ondansetron  and Propofol  infusion  Airway Management Planned: Oral ETT and Video Laryngoscope Planned  Additional Equipment: None  Intra-op Plan:   Post-operative Plan: Extubation in OR  Informed Consent: I have reviewed the patients History and Physical, chart, labs and discussed the procedure including the risks, benefits and alternatives for the proposed anesthesia with the patient or authorized representative who has indicated his/her understanding and acceptance.     Dental advisory given  Plan Discussed with: CRNA and Anesthesiologist  Anesthesia Plan Comments: (Agreeable to  albumin as needed )         Anesthesia Quick Evaluation

## 2024-07-21 ENCOUNTER — Encounter (HOSPITAL_COMMUNITY): Payer: Self-pay | Admitting: Gynecologic Oncology

## 2024-07-21 ENCOUNTER — Telehealth: Payer: Self-pay | Admitting: *Deleted

## 2024-07-21 NOTE — Telephone Encounter (Signed)
 Attempted to reach patient for post op call. Left voicemail requesting call back.   Spoke with Ms. Suchocki who returned call this morning. She states she is eating, drinking and her foley catheter is draining well. Pt states she emptied the bag last night and it was full.  She has not had a BM yet but is passing gas. She is taking senokot as prescribed and encouraged her to drink plenty of water. She denies fever or chills. Incisions are dry and intact. She rates her pain 2/10. Her pain is controlled with tramadol .     Instructed to call office with any fever, chills, purulent drainage, uncontrolled pain or any other questions or concerns. Patient verbalizes understanding.   Pt aware of post op appointments as well as the office number [phone] and after hours number [phone] to call if she has any questions or concerns

## 2024-07-22 LAB — CYTOLOGY - NON PAP

## 2024-07-22 NOTE — Anesthesia Postprocedure Evaluation (Signed)
 Anesthesia Post Note  Patient: Bridget Butler  Procedure(s) Performed: HYSTERECTOMY, TOTAL, ROBOT-ASSISTED, LAPAROSCOPIC, WITH BILATERAL SALPINGO-OOPHORECTOMY (Bilateral: Abdomen) INJECTION, FOR SENTINEL LYMPH NODE IDENTIFICATION (Abdomen) LYMPH NODE BIOPSY (Abdomen) LAPAROSCOPY, OMENTECTOMY (Abdomen) UTEROSACRAL LIGAMENT SUSPENSION CYSTOSCOPY (Bladder) POSTERIOR REPAIR (RECTOCELE) (Bladder)     Patient location during evaluation: PACU Anesthesia Type: General Level of consciousness: awake and alert Pain management: pain level controlled Vital Signs Assessment: post-procedure vital signs reviewed and stable Respiratory status: spontaneous breathing, nonlabored ventilation and respiratory function stable Cardiovascular status: stable and blood pressure returned to baseline Anesthetic complications: no   No notable events documented.  Last Vitals:  Vitals:   07/20/24 2130 07/20/24 2200  BP: (!) 131/58 (!) 131/56  Pulse: 84 84  Resp: 13 13  Temp:  (!) 36.4 C  SpO2:  99%    Last Pain:  Vitals:   07/20/24 2200  TempSrc:   PainSc: 3                  Debby FORBES Like

## 2024-07-25 ENCOUNTER — Ambulatory Visit

## 2024-07-25 ENCOUNTER — Ambulatory Visit: Payer: Self-pay | Admitting: Gynecologic Oncology

## 2024-07-25 LAB — SURGICAL PATHOLOGY

## 2024-07-25 NOTE — Progress Notes (Signed)
 Bridget Butler underwent Hysterectomy, Total, Robot-assisted, Laparoscopic, With Bilateral Salpingo-oophorectomy - Bilateral, Injection, For Sentinel Lymph Node Identification, Lymph Node Biopsy, Laparoscopy, Omentectomy, Uterosacral Ligament Suspension, Cystoscopy, and Posterior Repair (rectocele) on 07/20/2024  She presents for a voiding trial.   Patient was identified with 2 identifiers.  The patient states she does not have any concerns with the foley placed.  500 mL of NS was instilled into the bladder via a catheter.  The catheter was removed and patient was instructed to void into the urinary hat.  She voided 200 mL, patient lost an immeasurable amount when the catheter was removed.  The post void residual measured by PVR was 100 mL.  She did pass the voiding trial.  The patient was not sent home with a catheter.    The patient received aftercare instructions and will follow up as scheduled.     Bridget Butler is a 78 y.o. female  here for a self-cath teaching. I demonstrated on the pt using a mirror how to use a 87fr cath to drain urine from the bladder. Pt was able to demonstrate successfully using the catheter and verbalized understanding. Pt was given a self-cath bag with several catheters, lubricant, a mirror, measuring hat.   Bridget Butler  underwent Hysterectomy, Total, Robot-assisted, Laparoscopic, With Bilateral Salpingo-oophorectomy - Bilateral, Injection, For Sentinel Lymph Node Identification, Lymph Node Biopsy, Laparoscopy, Omentectomy, Uterosacral Ligament Suspension, Cystoscopy, and Posterior Repair (rectocele)  on 07/20/2024  with [] Dr Marilynne [] Dr Guadlupe.  The patient reports that her pain is controlled.  She is taking [] No Medication [] Acetaminophen  500mg  every 6 hours [] Ibuprofen 600mg  every 6 hours or []  Prescribed Narcotic.  Her pain level is 5[] with medication [] Without medication is.   She reports vaginal bleeding.  The patient is tolerating PO fluids and solids. She has had a bowel  movement and is taking Miralax  for a bowel regimen. She is not passing gas.  She was discharged with a catheter.   []  Discharged without a catheter, the patient does not feel as if she is emptying her bladder.  [] Discharged with a catheter, the patient is not having any concerns with her catheter.  She will return for a voiding trial. [] Verified scheduled date and time with patient.  She does not having any additional questions.  Reviewed Post operative instructions as needed to answer additional questions.   CC'd note to patient's provider.

## 2024-07-25 NOTE — Patient Instructions (Signed)
  Please drink lots of water and try to expel as much urine as you are inputting. If you are unable to urinate, give the office a call before 2 pm.     Please keep all future appointments and if you have any questions or concerns please feel free to contact our office at [phone].

## 2024-07-26 ENCOUNTER — Ambulatory Visit

## 2024-07-26 DIAGNOSIS — Z48816 Encounter for surgical aftercare following surgery on the genitourinary system: Secondary | ICD-10-CM

## 2024-07-26 NOTE — Patient Instructions (Signed)
 Please keep all future appointments and if you have any questions or concerns please feel free to contact our office at [phone].

## 2024-07-26 NOTE — Progress Notes (Signed)
 Bridget Butler is a 78 y.o. female arrived today to have 55F foley placed back into her bladder. Drain out of her bladder.

## 2024-07-27 ENCOUNTER — Telehealth: Admitting: Gynecologic Oncology

## 2024-07-28 ENCOUNTER — Other Ambulatory Visit: Payer: Self-pay | Admitting: Internal Medicine

## 2024-07-28 ENCOUNTER — Encounter: Payer: Self-pay | Admitting: Gynecologic Oncology

## 2024-07-28 ENCOUNTER — Inpatient Hospital Stay: Attending: Gynecologic Oncology | Admitting: Gynecologic Oncology

## 2024-07-28 DIAGNOSIS — Z7982 Long term (current) use of aspirin: Secondary | ICD-10-CM | POA: Insufficient documentation

## 2024-07-28 DIAGNOSIS — E785 Hyperlipidemia, unspecified: Secondary | ICD-10-CM | POA: Insufficient documentation

## 2024-07-28 DIAGNOSIS — Z79630 Long term (current) use of alkylating agent: Secondary | ICD-10-CM | POA: Insufficient documentation

## 2024-07-28 DIAGNOSIS — Z90710 Acquired absence of both cervix and uterus: Secondary | ICD-10-CM | POA: Insufficient documentation

## 2024-07-28 DIAGNOSIS — G620 Drug-induced polyneuropathy: Secondary | ICD-10-CM | POA: Insufficient documentation

## 2024-07-28 DIAGNOSIS — K5909 Other constipation: Secondary | ICD-10-CM | POA: Insufficient documentation

## 2024-07-28 DIAGNOSIS — N3946 Mixed incontinence: Secondary | ICD-10-CM | POA: Insufficient documentation

## 2024-07-28 DIAGNOSIS — Z9049 Acquired absence of other specified parts of digestive tract: Secondary | ICD-10-CM | POA: Insufficient documentation

## 2024-07-28 DIAGNOSIS — N950 Postmenopausal bleeding: Secondary | ICD-10-CM | POA: Insufficient documentation

## 2024-07-28 DIAGNOSIS — I2510 Atherosclerotic heart disease of native coronary artery without angina pectoris: Secondary | ICD-10-CM | POA: Insufficient documentation

## 2024-07-28 DIAGNOSIS — N3000 Acute cystitis without hematuria: Secondary | ICD-10-CM | POA: Insufficient documentation

## 2024-07-28 DIAGNOSIS — Z833 Family history of diabetes mellitus: Secondary | ICD-10-CM | POA: Insufficient documentation

## 2024-07-28 DIAGNOSIS — Z79899 Other long term (current) drug therapy: Secondary | ICD-10-CM | POA: Insufficient documentation

## 2024-07-28 DIAGNOSIS — Z9079 Acquired absence of other genital organ(s): Secondary | ICD-10-CM

## 2024-07-28 DIAGNOSIS — Z8049 Family history of malignant neoplasm of other genital organs: Secondary | ICD-10-CM | POA: Insufficient documentation

## 2024-07-28 DIAGNOSIS — N1832 Chronic kidney disease, stage 3b: Secondary | ICD-10-CM | POA: Insufficient documentation

## 2024-07-28 DIAGNOSIS — Z7189 Other specified counseling: Secondary | ICD-10-CM

## 2024-07-28 DIAGNOSIS — Z90722 Acquired absence of ovaries, bilateral: Secondary | ICD-10-CM | POA: Insufficient documentation

## 2024-07-28 DIAGNOSIS — Z8249 Family history of ischemic heart disease and other diseases of the circulatory system: Secondary | ICD-10-CM | POA: Insufficient documentation

## 2024-07-28 DIAGNOSIS — N938 Other specified abnormal uterine and vaginal bleeding: Secondary | ICD-10-CM | POA: Insufficient documentation

## 2024-07-28 DIAGNOSIS — E1122 Type 2 diabetes mellitus with diabetic chronic kidney disease: Secondary | ICD-10-CM | POA: Insufficient documentation

## 2024-07-28 DIAGNOSIS — I129 Hypertensive chronic kidney disease with stage 1 through stage 4 chronic kidney disease, or unspecified chronic kidney disease: Secondary | ICD-10-CM | POA: Insufficient documentation

## 2024-07-28 DIAGNOSIS — Z803 Family history of malignant neoplasm of breast: Secondary | ICD-10-CM | POA: Insufficient documentation

## 2024-07-28 DIAGNOSIS — C541 Malignant neoplasm of endometrium: Secondary | ICD-10-CM | POA: Insufficient documentation

## 2024-07-28 DIAGNOSIS — R339 Retention of urine, unspecified: Secondary | ICD-10-CM | POA: Insufficient documentation

## 2024-07-28 DIAGNOSIS — I700 Atherosclerosis of aorta: Secondary | ICD-10-CM | POA: Insufficient documentation

## 2024-07-28 DIAGNOSIS — Z79633 Long term (current) use of mitotic inhibitor: Secondary | ICD-10-CM | POA: Insufficient documentation

## 2024-07-28 DIAGNOSIS — Z5111 Encounter for antineoplastic chemotherapy: Secondary | ICD-10-CM | POA: Insufficient documentation

## 2024-07-28 NOTE — Progress Notes (Signed)
 Gynecologic Oncology Telehealth Note: Gyn-Onc  I connected with Bridget Butler on 07/28/24 at  6:00 PM EDT by telephone and verified that I am speaking with the correct person using two identifiers.  I discussed the limitations, risks, security and privacy concerns of performing an evaluation and management service by telemedicine and the availability of in-person appointments. I also discussed with the patient that there may be a patient responsible charge related to this service. The patient expressed understanding and agreed to proceed.  Other persons participating in the visit and their role in the encounter: none.  Patient's location: home, [place] Provider's location: [facility], [place]  Reason for Visit: follow-up  Treatment History: The patient was initially seen in April with a history of 1 year of intermittent vaginal bleeding. She was also noted to have mixed urinary incontinence and pelvic prolapse at that visit. Pelvic ultrasound in May 2025 showed a uterus measuring 9.9 x 7.1 x 7.8 cm with an endometrial lining of 57 mm, fluid filled with multiple vascular masses.  Initial plan for hysteroscopy with D&C and polypectomy. Given delay in surgical clearance, EMB was ultimately performed on 06/29/24. Pathology revealed serous carcinoma, p53 abnormal, MMRp.  CT C/A/P on 07/12/24: 1. Large, heterogeneous mixed solid and cystic mass occupying the endometrial cavity and endocervical canal, measuring 9.6 x 5.8 x 5.7 cm, consistent with primary endometrial malignancy. 2. No evidence of lymphadenopathy or metastatic disease in the chest, abdomen, or pelvis. 3. Severely distended urinary bladder, maximum coronal span 21.7 cm similar to prior examination. Correlate for urinary retention. 4. Coronary artery disease.  07/20/24: Robotic-assisted laparoscopic total hysterectomy with bilateral salpingoophorectomy, SLN biopsy bilaterally, infracolic omentectomy   Interval History: Doing well. Using Tylenol  prn  for pain.  Denies vaginal bleeding. Catheter in place. Last large BM Sunday, small bowel function daily. Has been using fiber, took doculax.   Past Medical/Surgical History: Past Medical History:  Diagnosis Date   Cancer (HCC)    Cataract    had surgery   Diabetes mellitus without complication (HCC)    GERD (gastroesophageal reflux disease)    Hyperlipidemia    Hypertension    Multiple thyroid  nodules    Thyroid  disease     Past Surgical History:  Procedure Laterality Date   ANTERIOR AND POSTERIOR REPAIR N/A 07/20/2024   Procedure: POSTERIOR REPAIR (RECTOCELE);  Surgeon: Bridget Lianne DASEN, MD;  Location: [facility];  Service: Gynecology;  Laterality: N/A;  possible posterior repair   CHOLECYSTECTOMY     COSMETIC SURGERY Bilateral    eyelids   CYSTOSCOPY N/A 07/20/2024   Procedure: CYSTOSCOPY;  Surgeon: Bridget Lianne DASEN, MD;  Location: [facility];  Service: Gynecology;  Laterality: N/A;   EYE SURGERY     GALLBLADDER SURGERY     INJECTION, FOR SENTINEL LYMPH NODE IDENTIFICATION N/A 07/20/2024   Procedure: INJECTION, FOR SENTINEL LYMPH NODE IDENTIFICATION;  Surgeon: Bridget Comer SAUNDERS, MD;  Location: [facility];  Service: Gynecology;  Laterality: N/A;   LAPAROSCOPY, OMENTECTOMY N/A 07/20/2024   Procedure: LAPAROSCOPY, OMENTECTOMY;  Surgeon: Bridget Comer SAUNDERS, MD;  Location: [facility];  Service: Gynecology;  Laterality: N/A;   LYMPH NODE BIOPSY N/A 07/20/2024   Procedure: LYMPH NODE BIOPSY;  Surgeon: Bridget Comer SAUNDERS, MD;  Location: [facility];  Service: Gynecology;  Laterality: N/A;   MICROLARYNGOSCOPY WITH CO2 LASER AND EXCISION OF VOCAL CORD LESION N/A 04/02/2020   Procedure: MICROLARYNGOSCOPY WITH DILATION AND CO2 LASER AND EXCISION OF VOCAL CORD LESION w/MITOMYCIN  C;  Surgeon: Bridget Clark, MD;  Location:  [facility];  Service: ENT;  Laterality: N/A;   MICROLARYNGOSCOPY WITH DILATION N/A 05/23/2020   Procedure: MICROLARYNGOSCOPY W/ DILATION AND JET VENTILATION;  Surgeon: Bridget Clark, MD;  Location: [facility];   Service: ENT;  Laterality: N/A;   MICROLARYNGOSCOPY WITH DILATION N/A 07/13/2020   Procedure: MICROLARYNGOSCOPY with Jet Ventilation;  Surgeon: Bridget Clark, MD;  Location: [facility];  Service: ENT;  Laterality: N/A;   ROBOTIC ASSISTED TOTAL HYSTERECTOMY WITH BILATERAL SALPINGO OOPHERECTOMY Bilateral 07/20/2024   Procedure: HYSTERECTOMY, TOTAL, ROBOT-ASSISTED, LAPAROSCOPIC, WITH BILATERAL SALPINGO-OOPHORECTOMY;  Surgeon: Bridget Comer SAUNDERS, MD;  Location: [facility];  Service: Gynecology;  Laterality: Bilateral;   TRACHEOSTOMY TUBE PLACEMENT N/A 04/02/2020   Procedure: Awake TRACHEOSTOMY;  Surgeon: Bridget Clark, MD;  Location: [facility] OR;  Service: ENT;  Laterality: N/A;   TUBAL LIGATION     UTEROSACRAL LIGAMENT SUSPENSION N/A 07/20/2024   Procedure: UTEROSACRAL LIGAMENT SUSPENSION;  Surgeon: Bridget Lianne DASEN, MD;  Location: [facility];  Service: Gynecology;  Laterality: N/A;    Family History  Problem Relation Age of Onset   Heart disease Mother    Hypertension Mother    Uterine cancer Sister    Diabetes Sister    Diabetes Sister    Diabetes Sister    Diabetes Sister    Diabetes Sister    Diabetes Brother    Breast cancer Maternal Aunt    Breast cancer Maternal Aunt    Breast cancer Cousin    Prostate cancer Neg Hx    Ovarian cancer Neg Hx    Pancreatic cancer Neg Hx    Colon cancer Neg Hx    Renal cancer Neg Hx     Social History   Socioeconomic History   Marital status: Widowed    Spouse name: Not on file   Number of children: 3   Years of education: 67   Highest education level: Not on file  Occupational History   Occupation: Retired  Tobacco Use   Smoking status: Never   Smokeless tobacco: Never  Vaping Use   Vaping status: Never Used  Substance and Sexual Activity   Alcohol use: No   Drug use: No   Sexual activity: Not Currently    Partners: Male    Birth control/protection: None  Other Topics Concern   Not on file  Social History Narrative   Bridget Butler is a 78 year old Jehovah  witness patient    She receives support from her daughter primarily who also assists with transportation to medical appointments.    She is presently independent with her ADLs and iADLs      Lives with son and daughter      Social Drivers of Health   Financial Resource Strain: Low Risk  (09/04/2023)   Overall Financial Resource Strain (CARDIA)    Difficulty of Paying Living Expenses: Not hard at all  Food Insecurity: No Food Insecurity (09/04/2023)   Hunger Vital Sign    Worried About Running Out of Food in the Last Year: Never true    Ran Out of Food in the Last Year: Never true  Transportation Needs: No Transportation Needs (09/04/2023)   PRAPARE - Administrator, Civil Service (Medical): No    Lack of Transportation (Non-Medical): No  Physical Activity: Insufficiently Active (09/04/2023)   Exercise Vital Sign    Days of Exercise per Week: 5 days    Minutes of Exercise per Session: 20 min  Stress: No Stress Concern Present (09/04/2023)   [employer] of Occupational  Health - Occupational Stress Questionnaire    Feeling of Stress : Not at all  Social Connections: Moderately Integrated (09/04/2023)   Social Connection and Isolation Panel    Frequency of Communication with Friends and Family: More than three times a week    Frequency of Social Gatherings with Friends and Family: More than three times a week    Attends Religious Services: More than 4 times per year    Active Member of [employer] or Organizations: Yes    Attends Banker Meetings: 1 to 4 times per year    Marital Status: Widowed    Current Medications:  Current Outpatient Medications:    amLODipine  (NORVASC ) 5 MG tablet, Take 1 tablet (5 mg total) by mouth daily., Disp: 90 tablet, Rfl: 0   aspirin  81 MG chewable tablet, Chew 81 mg by mouth daily., Disp: , Rfl:    Blood Glucose Monitoring Suppl (TRUE METRIX METER) DEVI, 1 Device by Does not apply route 2 (two) times daily., Disp: 1 each,  Rfl: 2   calcium  carbonate (OSCAL) 1500 (600 Ca) MG TABS tablet, Take 600 mg of elemental calcium  by mouth daily with breakfast., Disp: , Rfl:    Cholecalciferol  (VITAMIN D3) 125 MCG (5000 UT) CAPS, Take 5,000 Units by mouth daily., Disp: , Rfl:    CINNAMON PO, Take 2,000 mg by mouth daily., Disp: , Rfl:    Continuous Glucose Sensor (FREESTYLE LIBRE 3 PLUS SENSOR) MISC, 1 each by Does not apply route every 14 (fourteen) days., Disp: 6 each, Rfl: 3   FIBER PO, Take 1 capsule by mouth daily., Disp: , Rfl:    insulin  degludec (TRESIBA) 200 UNIT/ML FlexTouch Pen, Inject 16 Units into the skin daily., Disp: , Rfl:    Insulin  Pen Needle 32G X 6 MM MISC, 1 Act by Does not apply route once a week., Disp: 100 each, Rfl: 1   Multiple Vitamin (MULTIVITAMIN WITH MINERALS) TABS tablet, Take 1 tablet by mouth daily., Disp: , Rfl:    olmesartan  (BENICAR ) 20 MG tablet, Take 1 tablet (20 mg total) by mouth daily., Disp: 90 tablet, Rfl: 0   omeprazole  (PRILOSEC) 40 MG capsule, TAKE 1 CAPSULE BY MOUTH TWICE DAILY IN THE MORNING AND AT BEDTIME, Disp: 180 capsule, Rfl: 0   potassium chloride  (KLOR-CON  10) 10 MEQ tablet, Take 1 tablet (10 mEq total) by mouth 2 (two) times daily., Disp: 180 tablet, Rfl: 1   pravastatin  (PRAVACHOL ) 40 MG tablet, Take 1 tablet (40 mg total) by mouth at bedtime., Disp: 90 tablet, Rfl: 1   Semaglutide ,0.25 or 0.5MG /DOS, (OZEMPIC , 0.25 OR 0.5 MG/DOSE,) 2 MG/3ML SOPN, Inject 0.5mg  into skin weekly (Patient not taking: Reported on 07/18/2024), Disp: , Rfl:    senna-docusate (SENOKOT-S) 8.6-50 MG tablet, Take 2 tablets by mouth at bedtime. For AFTER surgery, do not take if having diarrhea, Disp: 30 tablet, Rfl: 0   traMADol  (ULTRAM ) 50 MG tablet, Take 1 tablet (50 mg total) by mouth every 6 (six) hours as needed for moderate pain (pain score 4-6). For AFTER surgery only, do not take and drive, Disp: 10 tablet, Rfl: 0  Review of Symptoms: Pertinent positives as per HPI.  Physical Exam: Deferred  given limitations of phone visit.  Laboratory & Radiologic Studies: A. RIGHT OBTURATOR SENTINEL LYMPH NODE BIOPSY: - Lymph node, negative for carcinoma (0/1)  B. LEFT EXTERNAL ILIAC VEIN SENTINEL LYMPH NODE BIOPSY: - Lymph node, negative for carcinoma (0/1)  C. UTERUS, CERVIX, BILATERAL FALLOPIAN TUBES AND OVARIES: - High-grade  serous carcinoma, involving anterior and posterior endometrium - Carcinoma invades more than 50% of the myometrium - The cervical surface is involved but definite stromal involvement is not seen - No evidence of lymphovascular invasion - Benign unremarkable bilateral ovaries and fallopian tubes - See oncology table - See comment  D. OMENTUM: - Portion of omentum, negative for carcinoma  E. RIGHT EXTERNAL ILIAC VEIN SENTINEL LYMPH NODE BIOPSY: - Lymph node, negative for carcinoma (0/1)  F. BLADDER BIOPSY: - Bladder mucosa with mild acute inflammation and reactive changes - Negative for dysplasia or carcinoma  COMMENT: C.  The predominant invasion into myometrium is seen in the posterior lower uterine segment.  ONCOLOGY TABLE:  UTERUS, CARCINOMA OR CARCINOSARCOMA: Resection  Procedure: Total hysterectomy and bilateral salpingo-oophorectomy Histologic Type: Serous carcinoma Histologic Grade: High-grade Myometrial Invasion:      Depth of Myometrial Invasion (mm): Approximately 10 mm (in the posterior lower uterine segment)      Myometrial Thickness (mm): Approximately 14 mm      Percentage of Myometrial Invasion: 70% Uterine Serosa Involvement: Not identified Cervical stromal Involvement: Not identified Extent of involvement of other tissue/organs: Not identified Peritoneal/Ascitic Fluid: Not involved Lymphovascular Invasion: Not identified Regional Lymph Nodes:      Pelvic Lymph Nodes Examined:                                  3 Sentinel                                  0 Non-sentinel                                  3 Total      Pelvic  Lymph Nodes with Metastasis: 0                          Macrometastasis: (>2.0 mm): 0                          Micrometastasis: (>0.2 mm and < 2.0 mm): 0                          Isolated Tumor Cells (<0.2 mm): 0                          Laterality of Lymph Node with Tumor: Not applicable                          Extracapsular Extension: Not applicable      Para-aortic Lymph Nodes Examined:                                   0 Sentinel                                   0 non-sentinel  0 total Distant Metastasis:      Distant Site(s) Involved: Not applicable Pathologic Stage Classification (pTNM, AJCC 8th Edition): pT1b, pN0 Ancillary Studies: MMR immunostains on the previous specimen showed retained nuclear expression. Representative Tumor Block: C8 Comment(s): Pancytokeratin was performed on the lymph nodes and is negative.   Cytology FINAL MICROSCOPIC DIAGNOSIS:  - Reactive mesothelial cells present   Assessment & Plan: Bridget Butler is a 78 y.o. woman with Stage IIC high grade endometrial cancer (serous on original biopsy but no specified on hysterectomy) who presents for phone follow-up.  Doing well post-op. Discussed continued expectations and restrictions. Reviewed pathology from surgery. Discussed plan most likely for adjuvant chemotherapy with VBT (but would like to confirm that final pathology agrees with serous histology).  I discussed the assessment and treatment plan with the patient. The patient was provided with an opportunity to ask questions and all were answered. The patient agreed with the plan and demonstrated an understanding of the instructions.   The patient was advised to call back or see an in-person evaluation if the symptoms worsen or if the condition fails to improve as anticipated.   12 minutes of total time was spent for this patient encounter, including preparation, phone counseling with the patient and coordination of  care, and documentation of the encounter.   Comer Dollar, MD  Division of Gynecologic Oncology  Department of Obstetrics and Gynecology  [facility]

## 2024-07-28 NOTE — Telephone Encounter (Unsigned)
 Copied from CRM [id number]. Topic: Clinical - Medication Refill >> Jul 28, 2024  4:00 PM Thersia C wrote: Medication: pravastatin  (PRAVACHOL ) 40 MG tablet  Has the patient contacted their pharmacy? Yes (Agent: If no, request that the patient contact the pharmacy for the refill. If patient does not wish to contact the pharmacy document the reason why and proceed with request.) (Agent: If yes, when and what did the pharmacy advise?)  This is the patient's preferred pharmacy:  [facility] [address] ([address]), [place] - [address] [place] ([place]) [place] [address] Phone: [phone] Fax: [phone]   Is this the correct pharmacy for this prescription? Yes If no, delete pharmacy and type the correct one.   Has the prescription been filled recently? No  Is the patient out of the medication? Yes  Has the patient been seen for an appointment in the last year OR does the patient have an upcoming appointment? Yes  Can we respond through MyChart? Yes  Agent: Please be advised that Rx refills may take up to 3 business days. We ask that you follow-up with your pharmacy.

## 2024-07-29 ENCOUNTER — Telehealth: Payer: Self-pay | Admitting: Oncology

## 2024-07-29 ENCOUNTER — Encounter: Payer: Self-pay | Admitting: Oncology

## 2024-07-29 DIAGNOSIS — C541 Malignant neoplasm of endometrium: Secondary | ICD-10-CM

## 2024-07-29 NOTE — Progress Notes (Signed)
 Referral placed to radiation oncology per Dr. Pricilla Holm.

## 2024-07-29 NOTE — Telephone Encounter (Signed)
 Bridget Butler and scheduled a new patient appointment with Dr. Lonn on 08/02/24 at 2:00 with 1:30 arrival to the cancer center to check in.

## 2024-07-29 NOTE — Progress Notes (Signed)
 Requested Her2 testing on accession [id number] with [facility] per Dr. Viktoria.

## 2024-08-01 ENCOUNTER — Other Ambulatory Visit: Payer: Self-pay | Admitting: Oncology

## 2024-08-01 NOTE — Progress Notes (Signed)
 Gynecologic Oncology Multi-Disciplinary Disposition Conference Note  Date of the Conference: 08/01/2024  Patient Name: Bridget Butler  Referring Provider: Dr. Nikki Primary GYN Oncologist: Dr. Viktoria   Stage/Disposition:  Stage IIC serous uterine carcinoma. Disposition is to adjuvant chemotherapy with vaginal brachytherapy.   This Multidisciplinary conference took place involving physicians from Gynecologic Oncology, Medical Oncology, Radiation Oncology, Pathology, Radiology along with the Gynecologic Oncology Nurse Practitioner and Gynecologic Oncology Nurse Navigator.  Comprehensive assessment of the patient's malignancy, staging, need for surgery, chemotherapy, radiation therapy, and need for further testing were reviewed. Supportive measures, both inpatient and following discharge were also discussed. The recommended plan of care is documented. Greater than 35 minutes were spent correlating and coordinating this patient's care.

## 2024-08-02 ENCOUNTER — Inpatient Hospital Stay (HOSPITAL_BASED_OUTPATIENT_CLINIC_OR_DEPARTMENT_OTHER): Admitting: Hematology and Oncology

## 2024-08-02 ENCOUNTER — Encounter: Payer: Self-pay | Admitting: Hematology and Oncology

## 2024-08-02 VITALS — BP 139/58 | HR 87 | Temp 98.9°F | Resp 16 | Wt 173.8 lb

## 2024-08-02 DIAGNOSIS — N3946 Mixed incontinence: Secondary | ICD-10-CM | POA: Diagnosis not present

## 2024-08-02 DIAGNOSIS — Z79899 Other long term (current) drug therapy: Secondary | ICD-10-CM | POA: Diagnosis not present

## 2024-08-02 DIAGNOSIS — N3000 Acute cystitis without hematuria: Secondary | ICD-10-CM | POA: Diagnosis not present

## 2024-08-02 DIAGNOSIS — E1122 Type 2 diabetes mellitus with diabetic chronic kidney disease: Secondary | ICD-10-CM

## 2024-08-02 DIAGNOSIS — Z79630 Long term (current) use of alkylating agent: Secondary | ICD-10-CM | POA: Diagnosis not present

## 2024-08-02 DIAGNOSIS — G620 Drug-induced polyneuropathy: Secondary | ICD-10-CM | POA: Diagnosis not present

## 2024-08-02 DIAGNOSIS — Z794 Long term (current) use of insulin: Secondary | ICD-10-CM | POA: Diagnosis not present

## 2024-08-02 DIAGNOSIS — C541 Malignant neoplasm of endometrium: Secondary | ICD-10-CM | POA: Diagnosis present

## 2024-08-02 DIAGNOSIS — Z833 Family history of diabetes mellitus: Secondary | ICD-10-CM | POA: Diagnosis not present

## 2024-08-02 DIAGNOSIS — Z7982 Long term (current) use of aspirin: Secondary | ICD-10-CM | POA: Diagnosis not present

## 2024-08-02 DIAGNOSIS — N1832 Chronic kidney disease, stage 3b: Secondary | ICD-10-CM

## 2024-08-02 DIAGNOSIS — Z90710 Acquired absence of both cervix and uterus: Secondary | ICD-10-CM | POA: Diagnosis not present

## 2024-08-02 DIAGNOSIS — K5909 Other constipation: Secondary | ICD-10-CM | POA: Diagnosis not present

## 2024-08-02 DIAGNOSIS — E785 Hyperlipidemia, unspecified: Secondary | ICD-10-CM | POA: Diagnosis not present

## 2024-08-02 DIAGNOSIS — R339 Retention of urine, unspecified: Secondary | ICD-10-CM | POA: Diagnosis not present

## 2024-08-02 DIAGNOSIS — Z90722 Acquired absence of ovaries, bilateral: Secondary | ICD-10-CM | POA: Diagnosis not present

## 2024-08-02 DIAGNOSIS — N938 Other specified abnormal uterine and vaginal bleeding: Secondary | ICD-10-CM | POA: Diagnosis not present

## 2024-08-02 DIAGNOSIS — N950 Postmenopausal bleeding: Secondary | ICD-10-CM | POA: Diagnosis not present

## 2024-08-02 DIAGNOSIS — Z79633 Long term (current) use of mitotic inhibitor: Secondary | ICD-10-CM | POA: Diagnosis not present

## 2024-08-02 DIAGNOSIS — Z5111 Encounter for antineoplastic chemotherapy: Secondary | ICD-10-CM | POA: Diagnosis present

## 2024-08-02 DIAGNOSIS — Z8249 Family history of ischemic heart disease and other diseases of the circulatory system: Secondary | ICD-10-CM | POA: Diagnosis not present

## 2024-08-02 DIAGNOSIS — I129 Hypertensive chronic kidney disease with stage 1 through stage 4 chronic kidney disease, or unspecified chronic kidney disease: Secondary | ICD-10-CM | POA: Diagnosis not present

## 2024-08-02 DIAGNOSIS — I2510 Atherosclerotic heart disease of native coronary artery without angina pectoris: Secondary | ICD-10-CM | POA: Diagnosis not present

## 2024-08-02 DIAGNOSIS — I700 Atherosclerosis of aorta: Secondary | ICD-10-CM | POA: Diagnosis not present

## 2024-08-02 DIAGNOSIS — Z9049 Acquired absence of other specified parts of digestive tract: Secondary | ICD-10-CM | POA: Diagnosis not present

## 2024-08-02 LAB — SURGICAL PATHOLOGY

## 2024-08-02 MED ORDER — ONDANSETRON HCL 8 MG PO TABS: 8.0000 mg | ORAL_TABLET | Freq: Three times a day (TID) | ORAL | 1 refills | Status: DC | PRN

## 2024-08-02 MED ORDER — DEXAMETHASONE 4 MG PO TABS: ORAL_TABLET | ORAL | 6 refills | Status: AC

## 2024-08-02 MED ORDER — LIDOCAINE-PRILOCAINE 2.5-2.5 % EX CREA: TOPICAL_CREAM | CUTANEOUS | 3 refills | Status: DC

## 2024-08-02 MED ORDER — PROCHLORPERAZINE MALEATE 10 MG PO TABS: 10.0000 mg | ORAL_TABLET | Freq: Four times a day (QID) | ORAL | 1 refills | Status: DC | PRN

## 2024-08-02 MED ORDER — SENNOSIDES-DOCUSATE SODIUM 8.6-50 MG PO TABS: 2.0000 | ORAL_TABLET | Freq: Every day | ORAL | Status: DC

## 2024-08-02 MED ORDER — POLYETHYLENE GLYCOL 3350 17 G PO PACK: 17.0000 g | PACK | Freq: Every day | ORAL | Status: AC

## 2024-08-02 NOTE — Progress Notes (Signed)
 [facility] CONSULT NOTE  Patient Care Team: Joshua Debby CROME, MD as PCP - General (Internal Medicine) Jarold Mayo, MD as Consulting Physician (Ophthalmology) Trixie File, MD as Consulting Physician (Internal Medicine) Dallie Vera GAILS, MD as Consulting Physician (Obstetrics and Gynecology)  ASSESSMENT & PLAN:  Endometrial cancer [facility]) The patient was diagnosed with uterine cancer after presentation of postmenopausal bleeding CT imaging showed large uterine mass and severely distended urinary bladder She had hysterectomy performed on July 20, 2024 and did well postoperatively without complications Final pathology: 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 The intent is of curative intent.  We discussed some of the risks, benefits, side-effects of carboplatin & Taxol. Treatment is intravenous, every 3 weeks x 6 cycles  Some of the short term side-effects included, though not limited to, including weight loss, life threatening infections, risk of allergic reactions, need for transfusions of blood products, nausea, vomiting, change in bowel habits, loss of hair, admission to hospital for various reasons, and risks of death.   Long term side-effects are also discussed including risks of infertility, permanent damage to nerve function, hearing loss, chronic fatigue, kidney damage with possibility needing hemodialysis, and rare secondary malignancy including bone marrow disorders.  The patient is aware that the response rates discussed earlier is not guaranteed.  After a long discussion, patient made an informed decision to proceed with the prescribed plan of care.   Patient education material was dispensed. We discussed premedication with dexamethasone  before chemotherapy. I recommend port placement and chemo education class She will start treatment at the end of  the month I will reduce premed dexamethasone  due to diabetes and prescribed slightly reduced dose of paclitaxel due to pre-existing peripheral neuropathy  Type 2 diabetes mellitus with stage 3b chronic kidney disease, with long-term current use of insulin  (HCC) We have extensive discussions about the importance of dietary modification and exercise while on treatment I recommend high-protein diet while on treatment I gave her flowsheet to track her food intake for the next 2 weeks  Urinary retention Apparently, the patient has chronic urinary retention for a long time I will defer to urologist for management  Chronic constipation I recommend aggressive laxative therapy  Orders Placed This Encounter  Procedures   IR IMAGING GUIDED PORT INSERTION    Standing Status:   Future    Expected Date:   08/09/2024    Expiration Date:   08/02/2025    Reason for Exam (SYMPTOM  OR DIAGNOSIS REQUIRED):   need port for chemo to start 10/31    Preferred Imaging Location?:   [facility]   CBC with Differential (Cancer Center Only)    Standing Status:   Future    Expected Date:   08/19/2024    Expiration Date:   08/19/2025   CMP (Cancer Center only)    Standing Status:   Future    Expected Date:   08/19/2024    Expiration Date:   08/19/2025   CBC with Differential (Cancer Center Only)    Standing Status:   Future    Expected Date:   09/09/2024    Expiration Date:   09/09/2025   CMP (Cancer Center only)    Standing Status:   Future    Expected Date:   09/09/2024    Expiration Date:   09/09/2025   CBC with Differential (Cancer Center Only)    Standing Status:  Future    Expected Date:   09/30/2024    Expiration Date:   09/30/2025   CMP (Cancer Center only)    Standing Status:   Future    Expected Date:   09/30/2024    Expiration Date:   09/30/2025   CBC with Differential (Cancer Center Only)    Standing Status:   Future    Expected Date:   10/21/2024    Expiration Date:   10/21/2025    CMP (Cancer Center only)    Standing Status:   Future    Expected Date:   10/21/2024    Expiration Date:   10/21/2025   CBC with Differential (Cancer Center Only)    Standing Status:   Future    Expected Date:   11/11/2024    Expiration Date:   11/11/2025   CMP (Cancer Center only)    Standing Status:   Future    Expected Date:   11/11/2024    Expiration Date:   11/11/2025   CBC with Differential (Cancer Center Only)    Standing Status:   Future    Expected Date:   12/02/2024    Expiration Date:   12/02/2025   CMP (Cancer Center only)    Standing Status:   Future    Expected Date:   12/02/2024    Expiration Date:   12/02/2025    The total time spent in the appointment was 80 minutes encounter with patients including review of chart and various tests results, discussions about plan of care and coordination of care plan   All questions were answered. The patient knows to call the clinic with any problems, questions or concerns. No barriers to learning was detected.  [name], MD 10/14/20253:26 PM  CHIEF COMPLAINTS/PURPOSE OF CONSULTATION:  Urine cancer, for adjuvant treatment  HISTORY OF PRESENTING ILLNESS:  Bridget Butler 78 y.o. female is here because of recent diagnosis of uterine cancer She is here accompanied by her oldest daughter She is a retired Haematologist The patient has diagnosis of type 2 diabetes for over 30 years.  She has mild pre-existing peripheral neuropathy in her feet  I have reviewed her chart and materials related to her cancer extensively and collaborated history with the patient. Summary of oncologic history is as follows: Oncology History Overview Note  High grade serous, MMR normal   Endometrial cancer (HCC)  01/2024 Initial Diagnosis   The patient was initially seen in April with a history of 1 year of intermittent vaginal bleeding. She was also noted to have mixed urinary incontinence and pelvic prolapse at that visit. Pelvic ultrasound in May 2025 showed  a uterus measuring 9.9 x 7.1 x 7.8 cm with an endometrial lining of 57 mm, fluid filled with multiple vascular masses.    06/29/2024 Pathology Results   SURGICAL PATHOLOGY  CASE: [id number]  PATIENT: Bridget Butler  Surgical Pathology Report   Clinical History: PMB; large amount of yellow fluid followed by endometrial tissue (crm)   FINAL MICROSCOPIC DIAGNOSIS:   A. ENDOMETRIUM, CURETTAGE:       Serous carcinoma of endometrium.       See comment.   COMMENT:   Immunohistochemical stain for p53 shows mutant type of staining / null. ER is patchy positive. The findings are consistent with serous carcinoma of endometrium.   Mismatch Repair Protein (IHC)   SUMMARY INTERPRETATION: NORMAL    07/12/2024 Imaging   1. Large, heterogeneous mixed solid and cystic mass occupying the endometrial cavity and endocervical  canal, measuring 9.6 x 5.8 x 5.7 cm, consistent with primary endometrial malignancy. 2. No evidence of lymphadenopathy or metastatic disease in the chest, abdomen, or pelvis. 3. Severely distended urinary bladder, maximum coronal span 21.7 cm similar to prior examination. Correlate for urinary retention. 4. Coronary artery disease.   Aortic Atherosclerosis (ICD10-I70.0).   07/20/2024 Initial Diagnosis   Endometrial cancer (HCC)   07/20/2024 Surgery   Operation: Robotic-assisted laparoscopic total hysterectomy with bilateral salpingoophorectomy, SLN biopsy bilaterally, infracolic omentectomy   Surgeon: Viktoria Crank MD     Operative Findings: On EUA, 12 cm mobile uterus, cervix prolapsed to the introitus. On intra-abdominal entry, minimal adhesive disease from prior cholecystectomy. Normal appearing omentum, small and large bowel. Uterus 10 cm and boggy. Normal appearing bilateral tubes and ovaries. No ascites. Mapping successful to bilateral external iliac vein SLNs. On the right, two channels also noted within the obturator space although terminated at two lymph nodes that  were not green (removed as obturator SLNs). No obvious extra-uterine evidence of disease. Bladder wall quite redundant.   07/20/2024 Surgery   Exam under anesthesia, robotic assisted laparoscopic uterosacral ligament suspension, possible posterior repair, cystourethroscopy with bladder biopsy    Attending Surgeon: Lianne Leila Gillis MD    Findings: 1. On vaginal exam, stage II prolapse present                2. On cystoscopy, erythematous bladder mucosa in and above trigone at midline at bladder dome with 2 biopsies taken at the bladder dome. Normal urethral mucosa without injury or lesion. Brisk bilateral ureteral efflux present.     07/20/2024 Pathology Results   SURGICAL PATHOLOGY  CASE: [id number]  PATIENT: Bridget Butler  Surgical Pathology Report   Clinical History: endometrial cancer   FINAL MICROSCOPIC DIAGNOSIS:   A. RIGHT OBTURATOR SENTINEL LYMPH NODE BIOPSY:  - Lymph node, negative for carcinoma (0/1)   B. LEFT EXTERNAL ILIAC VEIN SENTINEL LYMPH NODE BIOPSY:  - Lymph node, negative for carcinoma (0/1)   C. UTERUS, CERVIX, BILATERAL FALLOPIAN TUBES AND OVARIES:  - High-grade serous carcinoma, involving anterior and posterior endometrium  - Carcinoma invades more than 50% of the myometrium  - The cervical surface is involved but definite stromal involvement is not seen  - No evidence of lymphovascular invasion  - Benign unremarkable bilateral ovaries and fallopian tubes  - See oncology table  - See comment   D. OMENTUM: - Portion of omentum, negative for carcinoma  E. RIGHT EXTERNAL ILIAC VEIN SENTINEL LYMPH NODE BIOPSY: - Lymph node, negative for carcinoma (0/1)  F. BLADDER BIOPSY: - Bladder mucosa with mild acute inflammation and reactive changes - Negative for dysplasia or carcinoma The predominant invasion into myometrium is seen in the posterior lower uterine segment.  ONCOLOGY TABLE:  UTERUS, CARCINOMA OR CARCINOSARCOMA: Resection  Procedure: Total  hysterectomy and bilateral salpingo-oophorectomy Histologic Type: Serous carcinoma Histologic Grade: High-grade Myometrial Invasion:      Depth of Myometrial Invasion (mm): Approximately 10 mm (in the posterior lower uterine segment)      Myometrial Thickness (mm): Approximately 14 mm      Percentage of Myometrial Invasion: 70% Uterine Serosa Involvement: Not identified Cervical stromal Involvement: Not identified Extent of involvement of other tissue/organs: Not identified Peritoneal/Ascitic Fluid: Not involved Lymphovascular Invasion: Not identified Regional Lymph Nodes:      Pelvic Lymph Nodes Examined:  3 Sentinel                                  0 Non-sentinel                                  3 Total      Pelvic Lymph Nodes with Metastasis: 0                          Macrometastasis: (>2.0 mm): 0                          Micrometastasis: (>0.2 mm and < 2.0 mm): 0                          Isolated Tumor Cells (<0.2 mm): 0                          Laterality of Lymph Node with Tumor: Not applicable                          Extracapsular Extension: Not applicable      Para-aortic Lymph Nodes Examined:                                   0 Sentinel                                   0 non-sentinel                                   0 total Distant Metastasis:      Distant Site(s) Involved: Not applicable      07/29/2024 Cancer Staging   Staging form: Corpus Uteri - Carcinoma and Carcinosarcoma, AJCC 8th Edition and FIGO 2023 - Pathologic stage from 07/29/2024: FIGO Stage II, calculated as Stage I (pT1, pN0, cM0, MMRd-) - Signed by Lonn Hicks, MD on 07/29/2024 Stage prefix: Initial diagnosis   08/19/2024 -  Chemotherapy   Patient is on Treatment Plan : UTERINE Carboplatin + Paclitaxel (5/135) q21d       MEDICAL HISTORY:  Past Medical History:  Diagnosis Date   Cancer (HCC)    Cataract    had surgery   Diabetes mellitus without complication  (HCC)    GERD (gastroesophageal reflux disease)    Hyperlipidemia    Hypertension    Multiple thyroid  nodules    Thyroid  disease     SURGICAL HISTORY: Past Surgical History:  Procedure Laterality Date   ANTERIOR AND POSTERIOR REPAIR N/A 07/20/2024   Procedure: POSTERIOR REPAIR (RECTOCELE);  Surgeon: Guadlupe Lianne DASEN, MD;  Location: [facility];  Service: Gynecology;  Laterality: N/A;  possible posterior repair   CHOLECYSTECTOMY     COSMETIC SURGERY Bilateral    eyelids   CYSTOSCOPY N/A 07/20/2024   Procedure: CYSTOSCOPY;  Surgeon: Guadlupe Lianne DASEN, MD;  Location: [facility];  Service: Gynecology;  Laterality: N/A;   EYE SURGERY     GALLBLADDER SURGERY  INJECTION, FOR SENTINEL LYMPH NODE IDENTIFICATION N/A 07/20/2024   Procedure: INJECTION, FOR SENTINEL LYMPH NODE IDENTIFICATION;  Surgeon: Viktoria Comer SAUNDERS, MD;  Location: [facility];  Service: Gynecology;  Laterality: N/A;   LAPAROSCOPY, OMENTECTOMY N/A 07/20/2024   Procedure: LAPAROSCOPY, OMENTECTOMY;  Surgeon: Viktoria Comer SAUNDERS, MD;  Location: [facility];  Service: Gynecology;  Laterality: N/A;   LYMPH NODE BIOPSY N/A 07/20/2024   Procedure: LYMPH NODE BIOPSY;  Surgeon: Viktoria Comer SAUNDERS, MD;  Location: [facility];  Service: Gynecology;  Laterality: N/A;   MICROLARYNGOSCOPY WITH CO2 LASER AND EXCISION OF VOCAL CORD LESION N/A 04/02/2020   Procedure: MICROLARYNGOSCOPY WITH DILATION AND CO2 LASER AND EXCISION OF VOCAL CORD LESION w/MITOMYCIN  C;  Surgeon: Carlie Clark, MD;  Location: [facility] OR;  Service: ENT;  Laterality: N/A;   MICROLARYNGOSCOPY WITH DILATION N/A 05/23/2020   Procedure: MICROLARYNGOSCOPY W/ DILATION AND JET VENTILATION;  Surgeon: Carlie Clark, MD;  Location: [facility] OR;  Service: ENT;  Laterality: N/A;   MICROLARYNGOSCOPY WITH DILATION N/A 07/13/2020   Procedure: MICROLARYNGOSCOPY with Jet Ventilation;  Surgeon: Carlie Clark, MD;  Location: [facility] OR;  Service: ENT;  Laterality: N/A;   ROBOTIC ASSISTED TOTAL HYSTERECTOMY WITH BILATERAL SALPINGO OOPHERECTOMY  Bilateral 07/20/2024   Procedure: HYSTERECTOMY, TOTAL, ROBOT-ASSISTED, LAPAROSCOPIC, WITH BILATERAL SALPINGO-OOPHORECTOMY;  Surgeon: Viktoria Comer SAUNDERS, MD;  Location: [facility];  Service: Gynecology;  Laterality: Bilateral;   TRACHEOSTOMY TUBE PLACEMENT N/A 04/02/2020   Procedure: Awake TRACHEOSTOMY;  Surgeon: Carlie Clark, MD;  Location: [facility] OR;  Service: ENT;  Laterality: N/A;   TUBAL LIGATION     UTEROSACRAL LIGAMENT SUSPENSION N/A 07/20/2024   Procedure: UTEROSACRAL LIGAMENT SUSPENSION;  Surgeon: Guadlupe Lianne DASEN, MD;  Location: [facility];  Service: Gynecology;  Laterality: N/A;    SOCIAL HISTORY: Social History   Socioeconomic History   Marital status: Widowed    Spouse name: Not on file   Number of children: 3   Years of education: 64   Highest education level: Not on file  Occupational History   Occupation: Retired  Tobacco Use   Smoking status: Never   Smokeless tobacco: Never  Vaping Use   Vaping status: Never Used  Substance and Sexual Activity   Alcohol use: No   Drug use: No   Sexual activity: Not Currently    Partners: Male    Birth control/protection: None  Other Topics Concern   Not on file  Social History Narrative   Mrs Rockers is a 78 year old Jehovah witness patient    She receives support from her daughter primarily who also assists with transportation to medical appointments.    She is presently independent with her ADLs and iADLs      Lives with son and daughter      Social Drivers of Health   Financial Resource Strain: Low Risk  (09/04/2023)   Overall Financial Resource Strain (CARDIA)    Difficulty of Paying Living Expenses: Not hard at all  Food Insecurity: No Food Insecurity (08/02/2024)   Hunger Vital Sign    Worried About Running Out of Food in the Last Year: Never true    Ran Out of Food in the Last Year: Never true  Transportation Needs: No Transportation Needs (08/02/2024)   PRAPARE - Administrator, Civil Service (Medical): No    Lack  of Transportation (Non-Medical): No  Physical Activity: Insufficiently Active (09/04/2023)   Exercise Vital Sign    Days of Exercise per Week: 5 days    Minutes of Exercise per  Session: 20 min  Stress: No Stress Concern Present (09/04/2023)   [employer] of Occupational Health - Occupational Stress Questionnaire    Feeling of Stress : Not at all  Social Connections: Moderately Integrated (09/04/2023)   Social Connection and Isolation Panel    Frequency of Communication with Friends and Family: More than three times a week    Frequency of Social Gatherings with Friends and Family: More than three times a week    Attends Religious Services: More than 4 times per year    Active Member of [employer] or Organizations: Yes    Attends Banker Meetings: 1 to 4 times per year    Marital Status: Widowed  Intimate Partner Violence: Not At Risk (08/02/2024)   Humiliation, Afraid, Rape, and Kick questionnaire    Fear of Current or Ex-Partner: No    Emotionally Abused: No    Physically Abused: No    Sexually Abused: No    FAMILY HISTORY: Family History  Problem Relation Age of Onset   Heart disease Mother    Hypertension Mother    Uterine cancer Sister    Diabetes Sister    Diabetes Sister    Diabetes Sister    Diabetes Sister    Diabetes Sister    Diabetes Brother    Breast cancer Maternal Aunt    Breast cancer Maternal Aunt    Breast cancer Cousin    Prostate cancer Neg Hx    Ovarian cancer Neg Hx    Pancreatic cancer Neg Hx    Colon cancer Neg Hx    Renal cancer Neg Hx     ALLERGIES:  is allergic to metformin  and related and other.  MEDICATIONS:  Current Outpatient Medications  Medication Sig Dispense Refill   dexamethasone  (DECADRON ) 4 MG tablet 2 tab the morning of chemotherapy, every 3 weeks, by mouth x 6 cycles 12 tablet 6   polyethylene glycol (MIRALAX ) 17 g packet Take 17 g by mouth daily.     amLODipine  (NORVASC ) 5 MG tablet Take 1 tablet (5 mg total) by  mouth daily. 90 tablet 0   aspirin  81 MG chewable tablet Chew 81 mg by mouth daily.     Blood Glucose Monitoring Suppl (TRUE METRIX METER) DEVI 1 Device by Does not apply route 2 (two) times daily. 1 each 2   calcium  carbonate (OSCAL) 1500 (600 Ca) MG TABS tablet Take 600 mg of elemental calcium  by mouth daily with breakfast.     Cholecalciferol  (VITAMIN D3) 125 MCG (5000 UT) CAPS Take 5,000 Units by mouth daily.     CINNAMON PO Take 2,000 mg by mouth daily.     Continuous Glucose Sensor (FREESTYLE LIBRE 3 PLUS SENSOR) MISC 1 each by Does not apply route every 14 (fourteen) days. 6 each 3   insulin  degludec (TRESIBA) 200 UNIT/ML FlexTouch Pen Inject 20 Units into the skin daily.     Insulin  Pen Needle 32G X 6 MM MISC 1 Act by Does not apply route once a week. 100 each 1   lidocaine -prilocaine (EMLA) cream Apply to affected area once 30 g 3   Multiple Vitamin (MULTIVITAMIN WITH MINERALS) TABS tablet Take 1 tablet by mouth daily.     olmesartan  (BENICAR ) 20 MG tablet Take 1 tablet (20 mg total) by mouth daily. 90 tablet 0   omeprazole  (PRILOSEC) 40 MG capsule TAKE 1 CAPSULE BY MOUTH TWICE DAILY IN THE MORNING AND AT BEDTIME 180 capsule 0   ondansetron  (ZOFRAN ) 8 MG tablet Take  1 tablet (8 mg total) by mouth every 8 (eight) hours as needed for nausea or vomiting. Start on the third day after chemotherapy. 30 tablet 1   potassium chloride  (KLOR-CON  10) 10 MEQ tablet Take 1 tablet (10 mEq total) by mouth 2 (two) times daily. 180 tablet 1   pravastatin  (PRAVACHOL ) 40 MG tablet Take 1 tablet (40 mg total) by mouth at bedtime. 90 tablet 1   prochlorperazine (COMPAZINE) 10 MG tablet Take 1 tablet (10 mg total) by mouth every 6 (six) hours as needed for nausea or vomiting. 30 tablet 1   Semaglutide ,0.25 or 0.5MG /DOS, (OZEMPIC , 0.25 OR 0.5 MG/DOSE,) 2 MG/3ML SOPN Inject 0.5mg  into skin weekly (Patient not taking: Reported on 07/18/2024)     senna-docusate (SENOKOT-S) 8.6-50 MG tablet Take 2 tablets by mouth at  bedtime.     No current facility-administered medications for this visit.    REVIEW OF SYSTEMS: She has chronic constipation, typically only goes to the bathroom once a week.  She is not taking laxative consistently.  She has reduced oral fluid intake.  Her diabetes control is poor Constitutional: Denies fevers, chills or abnormal night sweats Eyes: Denies blurriness of vision, double vision or watery eyes Ears, nose, mouth, throat, and face: Denies mucositis or sore throat Respiratory: Denies cough, dyspnea or wheezes Cardiovascular: Denies palpitation, chest discomfort or lower extremity swelling Gastrointestinal:  Denies nausea, heartburn or change in bowel habits Skin: Denies abnormal skin rashes Lymphatics: Denies new lymphadenopathy or easy bruising Behavioral/Psych: Mood is stable, no new changes  All other systems were reviewed with the patient and are negative.  PHYSICAL EXAMINATION: ECOG PERFORMANCE STATUS: 1 - Symptomatic but completely ambulatory  Vitals:   08/02/24 1349  BP: (!) 139/58  Pulse: 87  Resp: 16  Temp: 98.9 F (37.2 C)  SpO2: 99%   Filed Weights   08/02/24 1349  Weight: 173 lb 12.8 oz (78.8 kg)    GENERAL:alert, no distress and comfortable SKIN: skin color, texture, turgor are normal, no rashes or significant lesions EYES: normal, conjunctiva are pink and non-injected, sclera clear OROPHARYNX:no exudate, no erythema and lips, buccal mucosa, and tongue normal  NECK: supple, thyroid  normal size, non-tender, without nodularity LYMPH:  no palpable lymphadenopathy in the cervical, axillary or inguinal LUNGS: clear to auscultation and percussion with normal breathing effort HEART: regular rate & rhythm and no murmurs and no lower extremity edema ABDOMEN:abdomen soft, non-tender and normal bowel sounds.  Noted well-healed surgical scar Musculoskeletal:no cyanosis of digits and no clubbing  PSYCH: alert & oriented x 3 with fluent speech NEURO: no focal  motor/sensory deficits  LABORATORY DATA:  I have reviewed the data as listed Lab Results  Component Value Date   WBC 7.9 07/19/2024   HGB 10.1 (L) 07/19/2024   HCT 32.7 (L) 07/19/2024   MCV 98.8 07/19/2024   PLT 334 07/19/2024   Recent Labs    01/04/24 1434 07/19/24 1333  NA 136 136  K 4.1 4.8  CL 101 102  CO2 28 24  GLUCOSE 207* 254*  BUN 12 15  CREATININE 1.05 1.18*  CALCIUM  10.0 9.9  GFRNONAA  --  47*  PROT  --  7.1  ALBUMIN  --  4.0  AST  --  25  ALT  --  17  ALKPHOS  --  72  BILITOT  --  0.5    RADIOGRAPHIC STUDIES: I have personally reviewed the radiological images as listed and agreed with the findings in the report. CT CHEST ABDOMEN  PELVIS W CONTRAST Result Date: 07/14/2024 CLINICAL DATA:  Endometrial cancer staging, high-grade serous endometrial cancer * Tracking Code: BO * EXAM: CT CHEST, ABDOMEN, AND PELVIS WITH CONTRAST TECHNIQUE: Multidetector CT imaging of the chest, abdomen and pelvis was performed following the standard protocol during bolus administration of intravenous contrast. RADIATION DOSE REDUCTION: This exam was performed according to the departmental dose-optimization program which includes automated exposure control, adjustment of the mA and/or kV according to patient size and/or use of iterative reconstruction technique. CONTRAST:  OMNIPAQUE  IOHEXOL  300 MG/ML  SOLN COMPARISON:  CT abdomen pelvis, 01/28/2023, CT chest, 01/04/2020 FINDINGS: CT CHEST FINDINGS Cardiovascular: Aortic atherosclerosis. Normal heart size. Three-vessel coronary artery calcifications. No pericardial effusion. Mediastinum/Nodes: No enlarged mediastinal, hilar, or axillary lymph nodes. Calcified thyroid  nodules, requiring no specific further follow-up or characterization in the setting of other known primary malignancy. Trachea and esophagus demonstrate no significant findings. Lungs/Pleura: Lungs are clear. No pleural effusion or pneumothorax. Musculoskeletal: No chest wall  abnormality. No acute osseous findings. CT ABDOMEN PELVIS FINDINGS Hepatobiliary: No focal liver abnormality is seen. Status post cholecystectomy. No biliary dilatation. Pancreas: Unremarkable. No pancreatic ductal dilatation or surrounding inflammatory changes. Spleen: Normal in size without significant abnormality. Adrenals/Urinary Tract: Adrenal glands are unremarkable. Kidneys are normal, without renal calculi, solid lesion, or hydronephrosis. Severely distended urinary bladder, maximum coronal span 21.7 cm (series 7, image 120). Stomach/Bowel: Stomach is within normal limits. Appendix appears normal. No evidence of bowel wall thickening, distention, or inflammatory changes. Vascular/Lymphatic: Aortic atherosclerosis. No enlarged abdominal or pelvic lymph nodes. Reproductive: Large, heterogeneous mixed solid and cystic mass occupying the endometrial cavity and endocervical canal, measuring 9.6 x 5.8 x 5.7 cm (series 7, image 116, series 2, image 76). Other: No abdominal wall hernia or abnormality. No ascites. Musculoskeletal: No acute osseous findings. IMPRESSION: 1. Large, heterogeneous mixed solid and cystic mass occupying the endometrial cavity and endocervical canal, measuring 9.6 x 5.8 x 5.7 cm, consistent with primary endometrial malignancy. 2. No evidence of lymphadenopathy or metastatic disease in the chest, abdomen, or pelvis. 3. Severely distended urinary bladder, maximum coronal span 21.7 cm similar to prior examination. Correlate for urinary retention. 4. Coronary artery disease. Aortic Atherosclerosis (ICD10-I70.0). Electronically Signed   By: Marolyn JONETTA Jaksch M.D.   On: 07/14/2024 09:38

## 2024-08-02 NOTE — Assessment & Plan Note (Signed)
I recommend aggressive laxative therapy

## 2024-08-02 NOTE — Assessment & Plan Note (Signed)
 Apparently, the patient has chronic urinary retention for a long time I will defer to urologist for management

## 2024-08-02 NOTE — Assessment & Plan Note (Signed)
 We have extensive discussions about the importance of dietary modification and exercise while on treatment I recommend high-protein diet while on treatment I gave her flowsheet to track her food intake for the next 2 weeks

## 2024-08-02 NOTE — Assessment & Plan Note (Signed)
 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 The intent is of curative intent.  We discussed some of the risks, benefits, side-effects of carboplatin & Taxol. Treatment is intravenous, every 3 weeks x 6 cycles  Some of the short term side-effects included, though not limited to, including weight loss, life threatening infections, risk of allergic reactions, need for transfusions of blood products, nausea, vomiting, change in bowel habits, loss of hair, admission to hospital for various reasons, and risks of death.   Long term side-effects are also discussed including risks of infertility, permanent damage to nerve function, hearing loss, chronic fatigue, kidney damage with possibility needing hemodialysis, and rare secondary malignancy including bone marrow disorders.  The patient is aware that the response rates discussed earlier is not guaranteed.  After a long discussion, patient made an informed decision to proceed with the prescribed plan of care.   Patient education material was dispensed. We discussed premedication with dexamethasone  before chemotherapy. I recommend port placement and chemo education class She will start treatment at the end of the month I will reduce premed dexamethasone  due to diabetes and prescribed slightly reduced dose of paclitaxel due to pre-existing peripheral neuropathy

## 2024-08-03 ENCOUNTER — Other Ambulatory Visit: Payer: Self-pay

## 2024-08-03 ENCOUNTER — Telehealth: Payer: Self-pay | Admitting: Internal Medicine

## 2024-08-03 NOTE — Telephone Encounter (Signed)
 Copied from CRM [id number]. Topic: Clinical - Prescription Issue >> Aug 03, 2024  2:35 PM Lauren C wrote: Reason for CRM: Pt had to schedule last appt with Niki Rung because she said Dr. Joshua had to cancel on her and she was told by miss Rung that her amlodipine  and pravastatin  would be filled. Patient states she requested the refill as soon as she found out they weren't filled yet (10/9) and no one responded to her refill request. She states she has now been out of both of these medications for weeks and her refill request was denied because he hasn't seen her recently. She is very upset about that, because he was the one that needed her to r/s her appt with someone else. She states that if she cannot get her meds now, she requests that he can at least bridge them until she can establish with a new provider.

## 2024-08-03 NOTE — Telephone Encounter (Signed)
 This has been sent in by [facility] on 9/25

## 2024-08-03 NOTE — Telephone Encounter (Unsigned)
 Copied from CRM [id number]. Topic: Clinical - Medication Refill >> Aug 03, 2024  1:49 PM Alfonso HERO wrote: Medication: amLODipine  (NORVASC ) 5 MG tablet  pravastatin  (PRAVACHOL ) 40 MG tablet   Has the patient contacted their pharmacy? No (Agent: If no, request that the patient contact the pharmacy for the refill. If patient does not wish to contact the pharmacy document the reason why and proceed with request.) (Agent: If yes, when and what did the pharmacy advise?)  This is the patient's preferred pharmacy:  [facility] [address] ([address]), [place] - [address] [place] ([place]) [place] [address] Phone: [phone] Fax: [phone]   Is this the correct pharmacy for this prescription? Yes If no, delete pharmacy and type the correct one.   Has the prescription been filled recently? Yes  Is the patient out of the medication? Yes  Has the patient been seen for an appointment in the last year OR does the patient have an upcoming appointment? Yes  Can we respond through MyChart? Yes  Agent: Please be advised that Rx refills may take up to 3 business days. We ask that you follow-up with your pharmacy.  Please give patient a call to give reason Pravastatin  was refused...SABRASABRA

## 2024-08-04 ENCOUNTER — Other Ambulatory Visit: Payer: Self-pay | Admitting: Family Medicine

## 2024-08-04 DIAGNOSIS — I152 Hypertension secondary to endocrine disorders: Secondary | ICD-10-CM

## 2024-08-04 NOTE — Telephone Encounter (Unsigned)
 Copied from CRM #[id number]. Topic: Clinical - Prescription Issue >> Aug 04, 2024 12:20 PM Tinnie BROCKS wrote: Reason for CRM: Pravastatin  refill requested multiple times since 10/9. Please reach out to pt if there is a reason she cannot get her Pravastatin . She has been out of it for weeks. States she was told by NP Merlynn she would refill her pravastatin . Refill was not placed and then refill refused by Dr. Joshua when she called to request it.

## 2024-08-05 ENCOUNTER — Other Ambulatory Visit: Payer: Self-pay

## 2024-08-05 ENCOUNTER — Telehealth: Payer: Self-pay

## 2024-08-05 DIAGNOSIS — E785 Hyperlipidemia, unspecified: Secondary | ICD-10-CM

## 2024-08-05 MED ORDER — PRAVASTATIN SODIUM 40 MG PO TABS: 40.0000 mg | ORAL_TABLET | Freq: Every day | ORAL | 0 refills | Status: DC

## 2024-08-05 NOTE — Telephone Encounter (Signed)
 Patient has been scheduled for an appointment and I sent her a temporary supply of medication. Patient was very irate on the phone about not being able to get an appointment.

## 2024-08-05 NOTE — Progress Notes (Signed)
 GYN Location of Tumor / Histology: Endometrial  Bridget Butler presented with symptoms of: {symptoms; gyn cyclic:13153::bleeding}  Biopsies revealed:    Past/Anticipated interventions by Gyn/Onc surgery, if any:   HYSTERECTOMY, TOTAL, ROBOT-ASSISTED, LAPAROSCOPIC, WITH BILATERAL SALPINGO-OOPHORECTOMY (Bilateral: Abdomen) INJECTION, FOR SENTINEL LYMPH NODE IDENTIFICATION (Abdomen) LYMPH NODE BIOPSY (Abdomen) LAPAROSCOPY, OMENTECTOMY (Abdomen) UTEROSACRAL LIGAMENT SUSPENSION CYSTOSCOPY (Bladder) POSTERIOR REPAIR (RECTOCELE) (Bladder)    Past/Anticipated interventions by medical oncology, if any:    Weight changes, if any: {:18581}  Bowel/Bladder complaints, if any: {yes no:314532}, {Blank single:19197::diarrhea,constipation,urinary frequency,burning,trouble emptying bladder, }  Nausea/Vomiting, if any: {:18581}  Pain issues, if any:  {:18581}  SAFETY ISSUES: Prior radiation? {:18581} Pacemaker/ICD? {:18581} Possible current pregnancy? {:18581} Is the patient on methotrexate? {:18581}  Current Complaints / other details:  ***

## 2024-08-05 NOTE — Telephone Encounter (Signed)
 Copied from CRM #[id number]. Topic: Appointments - Transfer of Care >> Aug 05, 2024 12:12 PM Joesph B wrote: Pt is requesting to transfer FROM: Dr.Thomas Jones Pt is requesting to transfer TO: Gaines Ada Reason for requested transfer: Patient says she never can see her provider, only other providers. It is the responsibility of the team the patient would like to transfer to (Dr. Gaines Ada) to reach out to the patient if for any reason this transfer is not acceptable. >> Aug 05, 2024 12:55 PM Lauren C wrote: Note: patient was scheduled with Bruna for TOC, not Gaines

## 2024-08-08 ENCOUNTER — Telehealth: Payer: Self-pay

## 2024-08-08 NOTE — Telephone Encounter (Signed)
 You okay with the transfer

## 2024-08-08 NOTE — Telephone Encounter (Signed)
 Patient Assistance  Medication:Tresiba  Dosage:U 200 Quantity:2 boxes Date received:08/08/24

## 2024-08-08 NOTE — Telephone Encounter (Signed)
 Yes I am ok with this

## 2024-08-09 DIAGNOSIS — Z1231 Encounter for screening mammogram for malignant neoplasm of breast: Secondary | ICD-10-CM | POA: Diagnosis not present

## 2024-08-09 LAB — HM MAMMOGRAPHY

## 2024-08-09 NOTE — Telephone Encounter (Signed)
 Please advise that Dr Joshua is okay with the TOC.

## 2024-08-09 NOTE — Progress Notes (Signed)
 Radiation Oncology         [phone] ________________________________  Initial Outpatient Consultation  Name: Bridget Butler MRN: [id number]  Date: 08/10/2024  DOB: 1946/10/06  RR:Gnwzd, Debby CROME, MD  Viktoria Comer SAUNDERS, MD   REFERRING PHYSICIAN: Viktoria Comer SAUNDERS, MD  DIAGNOSIS: There were no encounter diagnoses.  Endometrial cancer (HCC)   HISTORY OF PRESENT ILLNESS::Bridget Butler is a 78 y.o. female who is accompanied by ***. she is seen as a courtesy of Dr. Viktoria for an opinion concerning radiation therapy as part of management for her recently diagnosed endometrial cancer.  The patient presented to her O[facility] in April with complains of postmenstrual bleeding. She underwent a pelvic ultrasound on 02/23/24 showing a uterus measuring 9.9 x 7.1 x 7.8 cm with an endometrial lining of 57 mm, fluid filled with multiple vascular masses. In light of findings, she then underwent an endometrium biopsy on 06/29/24 with pathology showing serous carcinoma of endometrium. Immunohistochemical stain for p53 shows mutant type of staining / null. ER is patchy positive.    Subsequently, she was referred to Dr. Viktoria on 9/19 to discuss further plans. She recommended undergoing a CT CAP which she underwent on 07/12/24 showing a large, heterogeneous mixed solid and cystic mass occupying the endometrial cavity and endocervical canal, measuring 9.6 x 5.8 x 5.7 cm, consistent with primary endometrial malignancy with no evidence of lymphadenopathy or metastatic disease in the chest, abdomen, or pelvis.     Patient underwent a robotic assisted total hysterectomy, bilateral salpingo -oophorectomy on 07/20/24 under the care of Dr. Viktoria. Surgical pathology high-grade serous carcinoma, involving anterior and posterior endometrium with carcinoma invades more than 50% of the myometrium.      Pt case was presented to the tumor board on 08/01/24 with disposition to adjuvant chemotherapy with vaginal brachytherapy. She  was seen in consultation with Dr. Lonn on 08/02/24 where the opted to proceed with 6 cycles of carboplatin & Taxol.    PREVIOUS RADIATION THERAPY: No  PAST MEDICAL HISTORY:  Past Medical History:  Diagnosis Date   Cancer (HCC)    Cataract    had surgery   Diabetes mellitus without complication (HCC)    GERD (gastroesophageal reflux disease)    Hyperlipidemia    Hypertension    Multiple thyroid  nodules    Thyroid  disease     PAST SURGICAL HISTORY: Past Surgical History:  Procedure Laterality Date   ANTERIOR AND POSTERIOR REPAIR N/A 07/20/2024   Procedure: POSTERIOR REPAIR (RECTOCELE);  Surgeon: Guadlupe Lianne DASEN, MD;  Location: [facility];  Service: Gynecology;  Laterality: N/A;  possible posterior repair   CHOLECYSTECTOMY     COSMETIC SURGERY Bilateral    eyelids   CYSTOSCOPY N/A 07/20/2024   Procedure: CYSTOSCOPY;  Surgeon: Guadlupe Lianne DASEN, MD;  Location: [facility];  Service: Gynecology;  Laterality: N/A;   EYE SURGERY     GALLBLADDER SURGERY     INJECTION, FOR SENTINEL LYMPH NODE IDENTIFICATION N/A 07/20/2024   Procedure: INJECTION, FOR SENTINEL LYMPH NODE IDENTIFICATION;  Surgeon: Viktoria Comer SAUNDERS, MD;  Location: [facility];  Service: Gynecology;  Laterality: N/A;   LAPAROSCOPY, OMENTECTOMY N/A 07/20/2024   Procedure: LAPAROSCOPY, OMENTECTOMY;  Surgeon: Viktoria Comer SAUNDERS, MD;  Location: [facility];  Service: Gynecology;  Laterality: N/A;   LYMPH NODE BIOPSY N/A 07/20/2024   Procedure: LYMPH NODE BIOPSY;  Surgeon: Viktoria Comer SAUNDERS, MD;  Location: [facility];  Service: Gynecology;  Laterality: N/A;   MICROLARYNGOSCOPY WITH CO2 LASER AND EXCISION OF VOCAL  CORD LESION N/A 04/02/2020   Procedure: MICROLARYNGOSCOPY WITH DILATION AND CO2 LASER AND EXCISION OF VOCAL CORD LESION w/MITOMYCIN  C;  Surgeon: Carlie Clark, MD;  Location: [facility];  Service: ENT;  Laterality: N/A;   MICROLARYNGOSCOPY WITH DILATION N/A 05/23/2020   Procedure: MICROLARYNGOSCOPY W/ DILATION AND JET VENTILATION;  Surgeon: Carlie Clark,  MD;  Location: [facility] OR;  Service: ENT;  Laterality: N/A;   MICROLARYNGOSCOPY WITH DILATION N/A 07/13/2020   Procedure: MICROLARYNGOSCOPY with Jet Ventilation;  Surgeon: Carlie Clark, MD;  Location: [facility] OR;  Service: ENT;  Laterality: N/A;   ROBOTIC ASSISTED TOTAL HYSTERECTOMY WITH BILATERAL SALPINGO OOPHERECTOMY Bilateral 07/20/2024   Procedure: HYSTERECTOMY, TOTAL, ROBOT-ASSISTED, LAPAROSCOPIC, WITH BILATERAL SALPINGO-OOPHORECTOMY;  Surgeon: Viktoria Comer SAUNDERS, MD;  Location: [facility];  Service: Gynecology;  Laterality: Bilateral;   TRACHEOSTOMY TUBE PLACEMENT N/A 04/02/2020   Procedure: Awake TRACHEOSTOMY;  Surgeon: Carlie Clark, MD;  Location: [facility] OR;  Service: ENT;  Laterality: N/A;   TUBAL LIGATION     UTEROSACRAL LIGAMENT SUSPENSION N/A 07/20/2024   Procedure: UTEROSACRAL LIGAMENT SUSPENSION;  Surgeon: Guadlupe Lianne DASEN, MD;  Location: [facility];  Service: Gynecology;  Laterality: N/A;    FAMILY HISTORY:  Family History  Problem Relation Age of Onset   Heart disease Mother    Hypertension Mother    Uterine cancer Sister    Diabetes Sister    Diabetes Sister    Diabetes Sister    Diabetes Sister    Diabetes Sister    Diabetes Brother    Breast cancer Maternal Aunt    Breast cancer Maternal Aunt    Breast cancer Cousin    Prostate cancer Neg Hx    Ovarian cancer Neg Hx    Pancreatic cancer Neg Hx    Colon cancer Neg Hx    Renal cancer Neg Hx     SOCIAL HISTORY:  Social History   Tobacco Use   Smoking status: Never   Smokeless tobacco: Never  Vaping Use   Vaping status: Never Used  Substance Use Topics   Alcohol use: No   Drug use: No    ALLERGIES:  Allergies  Allergen Reactions   Metformin  And Related Diarrhea   Other     Refuses blood products.    MEDICATIONS:  Current Outpatient Medications  Medication Sig Dispense Refill   amLODipine  (NORVASC ) 5 MG tablet Take 1 tablet by mouth once daily 90 tablet 0   aspirin  81 MG chewable tablet Chew 81 mg by mouth daily.      Blood Glucose Monitoring Suppl (TRUE METRIX METER) DEVI 1 Device by Does not apply route 2 (two) times daily. 1 each 2   calcium  carbonate (OSCAL) 1500 (600 Ca) MG TABS tablet Take 600 mg of elemental calcium  by mouth daily with breakfast.     Cholecalciferol  (VITAMIN D3) 125 MCG (5000 UT) CAPS Take 5,000 Units by mouth daily.     CINNAMON PO Take 2,000 mg by mouth daily.     Continuous Glucose Sensor (FREESTYLE LIBRE 3 PLUS SENSOR) MISC 1 each by Does not apply route every 14 (fourteen) days. 6 each 3   dexamethasone  (DECADRON ) 4 MG tablet 2 tab the morning of chemotherapy, every 3 weeks, by mouth x 6 cycles 12 tablet 6   insulin  degludec (TRESIBA) 200 UNIT/ML FlexTouch Pen Inject 20 Units into the skin daily.     Insulin  Pen Needle 32G X 6 MM MISC 1 Act by Does not apply route once a week. 100 each 1   lidocaine -prilocaine (  EMLA) cream Apply to affected area once 30 g 3   Multiple Vitamin (MULTIVITAMIN WITH MINERALS) TABS tablet Take 1 tablet by mouth daily.     olmesartan  (BENICAR ) 20 MG tablet Take 1 tablet (20 mg total) by mouth daily. 90 tablet 0   omeprazole  (PRILOSEC) 40 MG capsule TAKE 1 CAPSULE BY MOUTH TWICE DAILY IN THE MORNING AND AT BEDTIME 180 capsule 0   ondansetron  (ZOFRAN ) 8 MG tablet Take 1 tablet (8 mg total) by mouth every 8 (eight) hours as needed for nausea or vomiting. Start on the third day after chemotherapy. 30 tablet 1   polyethylene glycol (MIRALAX ) 17 g packet Take 17 g by mouth daily.     potassium chloride  (KLOR-CON  10) 10 MEQ tablet Take 1 tablet (10 mEq total) by mouth 2 (two) times daily. 180 tablet 1   pravastatin  (PRAVACHOL ) 40 MG tablet Take 1 tablet (40 mg total) by mouth at bedtime. 31 tablet 0   prochlorperazine (COMPAZINE) 10 MG tablet Take 1 tablet (10 mg total) by mouth every 6 (six) hours as needed for nausea or vomiting. 30 tablet 1   Semaglutide ,0.25 or 0.5MG /DOS, (OZEMPIC , 0.25 OR 0.5 MG/DOSE,) 2 MG/3ML SOPN Inject 0.5mg  into skin weekly (Patient not  taking: Reported on 07/18/2024)     senna-docusate (SENOKOT-S) 8.6-50 MG tablet Take 2 tablets by mouth at bedtime.     No current facility-administered medications for this visit.    REVIEW OF SYSTEMS:  A 10+ POINT REVIEW OF SYSTEMS WAS OBTAINED including neurology, dermatology, psychiatry, cardiac, respiratory, lymph, extremities, GI, GU, musculoskeletal, constitutional, reproductive, HEENT. ***   PHYSICAL EXAM:  vitals were not taken for this visit.   General: Alert and oriented, in no acute distress HEENT: Head is normocephalic. Extraocular movements are intact. Oropharynx is clear. Neck: Neck is supple, no palpable cervical or supraclavicular lymphadenopathy. Heart: Regular in rate and rhythm with no murmurs, rubs, or gallops. Chest: Clear to auscultation bilaterally, with no rhonchi, wheezes, or rales. Abdomen: Soft, nontender, nondistended, with no rigidity or guarding. Extremities: No cyanosis or edema. Lymphatics: see Neck Exam Skin: No concerning lesions. Musculoskeletal: symmetric strength and muscle tone throughout. Neurologic: Cranial nerves II through XII are grossly intact. No obvious focalities. Speech is fluent. Coordination is intact. Psychiatric: Judgment and insight are intact. Affect is appropriate. ***  ECOG = ***  0 - Asymptomatic (Fully active, able to carry on all predisease activities without restriction)  1 - Symptomatic but completely ambulatory (Restricted in physically strenuous activity but ambulatory and able to carry out work of a light or sedentary nature. For example, light housework, office work)  2 - Symptomatic, <50% in bed during the day (Ambulatory and capable of all self care but unable to carry out any work activities. Up and about more than 50% of waking hours)  3 - Symptomatic, >50% in bed, but not bedbound (Capable of only limited self-care, confined to bed or chair 50% or more of waking hours)  4 - Bedbound (Completely disabled. Cannot  carry on any self-care. Totally confined to bed or chair)  5 - Death   Raylene MM, Creech RH, Tormey DC, et al. [phone]). Toxicity and response criteria of the [facility]. Am. DOROTHA Bridges. Oncol. 5 (6): 649-55  LABORATORY DATA:  Lab Results  Component Value Date   WBC 7.9 07/19/2024   HGB 10.1 (L) 07/19/2024   HCT 32.7 (L) 07/19/2024   MCV 98.8 07/19/2024   PLT 334 07/19/2024   NEUTROABS 3.0 01/04/2024  Lab Results  Component Value Date   NA 136 07/19/2024   K 4.8 07/19/2024   CL 102 07/19/2024   CO2 24 07/19/2024   GLUCOSE 254 (H) 07/19/2024   BUN 15 07/19/2024   CREATININE 1.18 (H) 07/19/2024   CALCIUM  9.9 07/19/2024      RADIOGRAPHY: CT CHEST ABDOMEN PELVIS W CONTRAST Result Date: 07/14/2024 CLINICAL DATA:  Endometrial cancer staging, high-grade serous endometrial cancer * Tracking Code: BO * EXAM: CT CHEST, ABDOMEN, AND PELVIS WITH CONTRAST TECHNIQUE: Multidetector CT imaging of the chest, abdomen and pelvis was performed following the standard protocol during bolus administration of intravenous contrast. RADIATION DOSE REDUCTION: This exam was performed according to the departmental dose-optimization program which includes automated exposure control, adjustment of the mA and/or kV according to patient size and/or use of iterative reconstruction technique. CONTRAST:  OMNIPAQUE  IOHEXOL  300 MG/ML  SOLN COMPARISON:  CT abdomen pelvis, 01/28/2023, CT chest, 01/04/2020 FINDINGS: CT CHEST FINDINGS Cardiovascular: Aortic atherosclerosis. Normal heart size. Three-vessel coronary artery calcifications. No pericardial effusion. Mediastinum/Nodes: No enlarged mediastinal, hilar, or axillary lymph nodes. Calcified thyroid  nodules, requiring no specific further follow-up or characterization in the setting of other known primary malignancy. Trachea and esophagus demonstrate no significant findings. Lungs/Pleura: Lungs are clear. No pleural effusion or pneumothorax.  Musculoskeletal: No chest wall abnormality. No acute osseous findings. CT ABDOMEN PELVIS FINDINGS Hepatobiliary: No focal liver abnormality is seen. Status post cholecystectomy. No biliary dilatation. Pancreas: Unremarkable. No pancreatic ductal dilatation or surrounding inflammatory changes. Spleen: Normal in size without significant abnormality. Adrenals/Urinary Tract: Adrenal glands are unremarkable. Kidneys are normal, without renal calculi, solid lesion, or hydronephrosis. Severely distended urinary bladder, maximum coronal span 21.7 cm (series 7, image 120). Stomach/Bowel: Stomach is within normal limits. Appendix appears normal. No evidence of bowel wall thickening, distention, or inflammatory changes. Vascular/Lymphatic: Aortic atherosclerosis. No enlarged abdominal or pelvic lymph nodes. Reproductive: Large, heterogeneous mixed solid and cystic mass occupying the endometrial cavity and endocervical canal, measuring 9.6 x 5.8 x 5.7 cm (series 7, image 116, series 2, image 76). Other: No abdominal wall hernia or abnormality. No ascites. Musculoskeletal: No acute osseous findings. IMPRESSION: 1. Large, heterogeneous mixed solid and cystic mass occupying the endometrial cavity and endocervical canal, measuring 9.6 x 5.8 x 5.7 cm, consistent with primary endometrial malignancy. 2. No evidence of lymphadenopathy or metastatic disease in the chest, abdomen, or pelvis. 3. Severely distended urinary bladder, maximum coronal span 21.7 cm similar to prior examination. Correlate for urinary retention. 4. Coronary artery disease. Aortic Atherosclerosis (ICD10-I70.0). Electronically Signed   By: Marolyn JONETTA Jaksch M.D.   On: 07/14/2024 09:38      IMPRESSION: Endometrial cancer (HCC)   ***  Today, I talked to the patient and family about the findings and work-up thus far.  We discussed the natural history of *** and general treatment, highlighting the role of radiotherapy in the management.  We discussed the available  radiation techniques, and focused on the details of logistics and delivery.  We reviewed the anticipated acute and late sequelae associated with radiation in this setting.  The patient was encouraged to ask questions that I answered to the best of my ability. *** A patient consent form was discussed and signed.  We retained a copy for our records.  The patient would like to proceed with radiation and will be scheduled for CT simulation.  PLAN: ***    *** minutes of total time was spent for this patient encounter, including preparation, face-to-face counseling with the patient  and coordination of care, physical exam, and documentation of the encounter.   ------------------------------------------------  Lynwood CHARM Nasuti, PhD, MD  This document serves as a record of services personally performed by Lynwood Nasuti, MD. It was created on his behalf by Reymundo Cartwright, a trained medical scribe. The creation of this record is based on the scribe's personal observations and the provider's statements to them. This document has been checked and approved by the attending provider.

## 2024-08-10 ENCOUNTER — Telehealth: Payer: Self-pay

## 2024-08-10 ENCOUNTER — Ambulatory Visit
Admission: RE | Admit: 2024-08-10 | Discharge: 2024-08-10 | Disposition: A | Discharge status: Home / Self Care | Source: Ambulatory Visit | Attending: Radiation Oncology | Admitting: Radiation Oncology

## 2024-08-10 ENCOUNTER — Encounter: Payer: Self-pay | Admitting: Radiation Oncology

## 2024-08-10 ENCOUNTER — Other Ambulatory Visit: Payer: Self-pay | Admitting: Radiology

## 2024-08-10 ENCOUNTER — Other Ambulatory Visit: Payer: Self-pay

## 2024-08-10 DIAGNOSIS — Z90710 Acquired absence of both cervix and uterus: Secondary | ICD-10-CM | POA: Insufficient documentation

## 2024-08-10 DIAGNOSIS — Z803 Family history of malignant neoplasm of breast: Secondary | ICD-10-CM | POA: Diagnosis not present

## 2024-08-10 DIAGNOSIS — I700 Atherosclerosis of aorta: Secondary | ICD-10-CM | POA: Diagnosis not present

## 2024-08-10 DIAGNOSIS — I10 Essential (primary) hypertension: Secondary | ICD-10-CM | POA: Insufficient documentation

## 2024-08-10 DIAGNOSIS — E119 Type 2 diabetes mellitus without complications: Secondary | ICD-10-CM | POA: Diagnosis not present

## 2024-08-10 DIAGNOSIS — C541 Malignant neoplasm of endometrium: Secondary | ICD-10-CM | POA: Diagnosis present

## 2024-08-10 DIAGNOSIS — E785 Hyperlipidemia, unspecified: Secondary | ICD-10-CM | POA: Insufficient documentation

## 2024-08-10 DIAGNOSIS — Z794 Long term (current) use of insulin: Secondary | ICD-10-CM | POA: Insufficient documentation

## 2024-08-10 DIAGNOSIS — R339 Retention of urine, unspecified: Secondary | ICD-10-CM | POA: Insufficient documentation

## 2024-08-10 DIAGNOSIS — I2510 Atherosclerotic heart disease of native coronary artery without angina pectoris: Secondary | ICD-10-CM | POA: Insufficient documentation

## 2024-08-10 DIAGNOSIS — K219 Gastro-esophageal reflux disease without esophagitis: Secondary | ICD-10-CM | POA: Insufficient documentation

## 2024-08-10 DIAGNOSIS — Z7982 Long term (current) use of aspirin: Secondary | ICD-10-CM | POA: Insufficient documentation

## 2024-08-10 DIAGNOSIS — Z79899 Other long term (current) drug therapy: Secondary | ICD-10-CM | POA: Diagnosis not present

## 2024-08-10 DIAGNOSIS — Z90722 Acquired absence of ovaries, bilateral: Secondary | ICD-10-CM | POA: Insufficient documentation

## 2024-08-10 DIAGNOSIS — E042 Nontoxic multinodular goiter: Secondary | ICD-10-CM | POA: Insufficient documentation

## 2024-08-10 DIAGNOSIS — Z7985 Long-term (current) use of injectable non-insulin antidiabetic drugs: Secondary | ICD-10-CM | POA: Insufficient documentation

## 2024-08-10 NOTE — H&P (Signed)
 Chief Complaint: Endometrial cancer; referred for Port-A-Cath placement to assist with treatment  Referring Provider(s): Gorsuch,N  Supervising Physician: Luverne Aran  Patient Status: Bridget Butler - Out-pt  History of Present Illness: Bridget Butler is a 78 y.o. female, Jehovah's witness, with past medical history significant for diabetes, GERD, hypertension, hyperlipidemia, prior tracheostomy for subglottic stenosis in 2021, thyroid  nodules who presents now with recently diagnosed endometrial/uterine cancer, s/p robotic-assisted laparoscopic total hysterectomy with bilateral salpingoophorectomy, SLN biopsy bilaterally, infracolic omentectomy on 07/20/24 .  She is scheduled today for Port-A-Cath placement to assist with treatment.  *** Patient is Full Code  Past Medical History:  Diagnosis Date   Cancer (HCC)    Cataract    had surgery   Diabetes mellitus without complication (HCC)    GERD (gastroesophageal reflux disease)    Hyperlipidemia    Hypertension    Multiple thyroid  nodules    Thyroid  disease     Past Surgical History:  Procedure Laterality Date   ANTERIOR AND POSTERIOR REPAIR N/A 07/20/2024   Procedure: POSTERIOR REPAIR (RECTOCELE);  Surgeon: Guadlupe Lianne DASEN, MD;  Location: [facility];  Service: Gynecology;  Laterality: N/A;  possible posterior repair   CHOLECYSTECTOMY     COSMETIC SURGERY Bilateral    eyelids   CYSTOSCOPY N/A 07/20/2024   Procedure: CYSTOSCOPY;  Surgeon: Guadlupe Lianne DASEN, MD;  Location: [facility];  Service: Gynecology;  Laterality: N/A;   EYE SURGERY     GALLBLADDER SURGERY     INJECTION, FOR SENTINEL LYMPH NODE IDENTIFICATION N/A 07/20/2024   Procedure: INJECTION, FOR SENTINEL LYMPH NODE IDENTIFICATION;  Surgeon: Viktoria Comer SAUNDERS, MD;  Location: [facility];  Service: Gynecology;  Laterality: N/A;   LAPAROSCOPY, OMENTECTOMY N/A 07/20/2024   Procedure: LAPAROSCOPY, OMENTECTOMY;  Surgeon: Viktoria Comer SAUNDERS, MD;  Location: [facility];  Service: Gynecology;  Laterality: N/A;    LYMPH NODE BIOPSY N/A 07/20/2024   Procedure: LYMPH NODE BIOPSY;  Surgeon: Viktoria Comer SAUNDERS, MD;  Location: [facility];  Service: Gynecology;  Laterality: N/A;   MICROLARYNGOSCOPY WITH CO2 LASER AND EXCISION OF VOCAL CORD LESION N/A 04/02/2020   Procedure: MICROLARYNGOSCOPY WITH DILATION AND CO2 LASER AND EXCISION OF VOCAL CORD LESION w/MITOMYCIN  C;  Surgeon: Carlie Clark, MD;  Location: [facility];  Service: ENT;  Laterality: N/A;   MICROLARYNGOSCOPY WITH DILATION N/A 05/23/2020   Procedure: MICROLARYNGOSCOPY W/ DILATION AND JET VENTILATION;  Surgeon: Carlie Clark, MD;  Location: [facility] OR;  Service: ENT;  Laterality: N/A;   MICROLARYNGOSCOPY WITH DILATION N/A 07/13/2020   Procedure: MICROLARYNGOSCOPY with Jet Ventilation;  Surgeon: Carlie Clark, MD;  Location: [facility];  Service: ENT;  Laterality: N/A;   ROBOTIC ASSISTED TOTAL HYSTERECTOMY WITH BILATERAL SALPINGO OOPHERECTOMY Bilateral 07/20/2024   Procedure: HYSTERECTOMY, TOTAL, ROBOT-ASSISTED, LAPAROSCOPIC, WITH BILATERAL SALPINGO-OOPHORECTOMY;  Surgeon: Viktoria Comer SAUNDERS, MD;  Location: [facility];  Service: Gynecology;  Laterality: Bilateral;   TRACHEOSTOMY TUBE PLACEMENT N/A 04/02/2020   Procedure: Awake TRACHEOSTOMY;  Surgeon: Carlie Clark, MD;  Location: [facility] OR;  Service: ENT;  Laterality: N/A;   TUBAL LIGATION     UTEROSACRAL LIGAMENT SUSPENSION N/A 07/20/2024   Procedure: UTEROSACRAL LIGAMENT SUSPENSION;  Surgeon: Guadlupe Lianne DASEN, MD;  Location: [facility];  Service: Gynecology;  Laterality: N/A;    Allergies: Metformin  and related and Other  Medications: Prior to Admission medications   Medication Sig Start Date End Date Taking? Authorizing Provider  amLODipine  (NORVASC ) 5 MG tablet Take 1 tablet by mouth once daily 08/08/24   Joshua Debby CROME, MD  aspirin  81  MG chewable tablet Chew 81 mg by mouth daily.    [provider]  Blood Glucose Monitoring Suppl (TRUE METRIX METER) DEVI 1 Device by Does not apply route 2 (two) times daily. 01/20/23    Joshua Debby CROME, MD  calcium  carbonate (OSCAL) 1500 (600 Ca) MG TABS tablet Take 600 mg of elemental calcium  by mouth daily with breakfast.    [provider]  Cholecalciferol  (VITAMIN D3) 125 MCG (5000 UT) CAPS Take 5,000 Units by mouth daily.    [provider]  CINNAMON PO Take 2,000 mg by mouth daily.    [provider]  Continuous Glucose Sensor (FREESTYLE LIBRE 3 PLUS SENSOR) MISC 1 each by Does not apply route every 14 (fourteen) days. 08/20/23   Trixie File, MD  dexamethasone  (DECADRON ) 4 MG tablet 2 tab the morning of chemotherapy, every 3 weeks, by mouth x 6 cycles 08/02/24   Lonn Hicks, MD  insulin  degludec (TRESIBA) 200 UNIT/ML FlexTouch Pen Inject 20 Units into the skin daily.    [provider]  Insulin  Pen Needle 32G X 6 MM MISC 1 Act by Does not apply route once a week. 07/28/23   Joshua Debby CROME, MD  lidocaine -prilocaine (EMLA) cream Apply to affected area once 08/02/24   Gorsuch, Ni, MD  Multiple Vitamin (MULTIVITAMIN WITH MINERALS) TABS tablet Take 1 tablet by mouth daily.    [provider]  olmesartan  (BENICAR ) 20 MG tablet Take 1 tablet (20 mg total) by mouth daily. 07/18/24   Merlynn Niki FALCON, FNP  omeprazole  (PRILOSEC) 40 MG capsule TAKE 1 CAPSULE BY MOUTH TWICE DAILY IN THE MORNING AND AT BEDTIME 03/10/24   Joshua Debby CROME, MD  ondansetron  (ZOFRAN ) 8 MG tablet Take 1 tablet (8 mg total) by mouth every 8 (eight) hours as needed for nausea or vomiting. Start on the third day after chemotherapy. 08/02/24   Lonn Hicks, MD  polyethylene glycol (MIRALAX ) 17 g packet Take 17 g by mouth daily. 08/02/24   Lonn Hicks, MD  potassium chloride  (KLOR-CON  10) 10 MEQ tablet Take 1 tablet (10 mEq total) by mouth 2 (two) times daily. 03/25/24   Joshua Debby CROME, MD  pravastatin  (PRAVACHOL ) 40 MG tablet Take 1 tablet (40 mg total) by mouth at bedtime. 08/05/24   Joshua Debby CROME, MD  prochlorperazine (COMPAZINE) 10 MG tablet Take 1 tablet (10 mg  total) by mouth every 6 (six) hours as needed for nausea or vomiting. 08/02/24   Lonn Hicks, MD  Semaglutide ,0.25 or 0.5MG /DOS, (OZEMPIC , 0.25 OR 0.5 MG/DOSE,) 2 MG/3ML SOPN Inject 0.5mg  into skin weekly Patient not taking: Reported on 07/18/2024 04/27/24   Trixie File, MD  senna-docusate (SENOKOT-S) 8.6-50 MG tablet Take 2 tablets by mouth at bedtime. 08/02/24   Lonn Hicks, MD     Family History  Problem Relation Age of Onset   Heart disease Mother    Hypertension Mother    Cancer Sister    Uterine cancer Sister    Diabetes Sister    Diabetes Sister    Diabetes Sister    Diabetes Sister    Diabetes Sister    Diabetes Brother    Cancer Maternal Aunt    Breast cancer Maternal Aunt    Breast cancer Maternal Aunt    Breast cancer Cousin    Prostate cancer Neg Hx    Ovarian cancer Neg Hx    Pancreatic cancer Neg Hx    Colon cancer Neg Hx    Renal cancer Neg Hx  Social History   Socioeconomic History   Marital status: Widowed    Spouse name: Not on file   Number of children: 3   Years of education: 34   Highest education level: Not on file  Occupational History   Occupation: Retired  Tobacco Use   Smoking status: Never   Smokeless tobacco: Never  Vaping Use   Vaping status: Never Used  Substance and Sexual Activity   Alcohol use: No   Drug use: No   Sexual activity: Not Currently    Partners: Male    Birth control/protection: None  Other Topics Concern   Not on file  Social History Narrative   Mrs Dutta is a 78 year old Jehovah witness patient    She receives support from her daughter primarily who also assists with transportation to medical appointments.    She is presently independent with her ADLs and iADLs      Lives with son and daughter      Social Drivers of Health   Financial Resource Strain: Low Risk  (09/04/2023)   Overall Financial Resource Strain (CARDIA)    Difficulty of Paying Living Expenses: Not hard at all  Food Insecurity: No Food  Insecurity (08/02/2024)   Hunger Vital Sign    Worried About Running Out of Food in the Last Year: Never true    Ran Out of Food in the Last Year: Never true  Transportation Needs: No Transportation Needs (08/02/2024)   PRAPARE - Administrator, Civil Service (Medical): No    Lack of Transportation (Non-Medical): No  Physical Activity: Insufficiently Active (09/04/2023)   Exercise Vital Sign    Days of Exercise per Week: 5 days    Minutes of Exercise per Session: 20 min  Stress: No Stress Concern Present (09/04/2023)   [employer] of Occupational Health - Occupational Stress Questionnaire    Feeling of Stress : Not at all  Social Connections: Moderately Integrated (09/04/2023)   Social Connection and Isolation Panel    Frequency of Communication with Friends and Family: More than three times a week    Frequency of Social Gatherings with Friends and Family: More than three times a week    Attends Religious Services: More than 4 times per year    Active Member of [employer] or Organizations: Yes    Attends Banker Meetings: 1 to 4 times per year    Marital Status: Widowed       Review of Systems  Vital Signs:   Advance Care Plan: No documents on file  Physical Exam  Imaging: CT CHEST ABDOMEN PELVIS W CONTRAST Result Date: 07/14/2024 CLINICAL DATA:  Endometrial cancer staging, high-grade serous endometrial cancer * Tracking Code: BO * EXAM: CT CHEST, ABDOMEN, AND PELVIS WITH CONTRAST TECHNIQUE: Multidetector CT imaging of the chest, abdomen and pelvis was performed following the standard protocol during bolus administration of intravenous contrast. RADIATION DOSE REDUCTION: This exam was performed according to the departmental dose-optimization program which includes automated exposure control, adjustment of the mA and/or kV according to patient size and/or use of iterative reconstruction technique. CONTRAST:  OMNIPAQUE  IOHEXOL  300 MG/ML  SOLN  COMPARISON:  CT abdomen pelvis, 01/28/2023, CT chest, 01/04/2020 FINDINGS: CT CHEST FINDINGS Cardiovascular: Aortic atherosclerosis. Normal heart size. Three-vessel coronary artery calcifications. No pericardial effusion. Mediastinum/Nodes: No enlarged mediastinal, hilar, or axillary lymph nodes. Calcified thyroid  nodules, requiring no specific further follow-up or characterization in the setting of other known primary malignancy. Trachea and esophagus demonstrate no  significant findings. Lungs/Pleura: Lungs are clear. No pleural effusion or pneumothorax. Musculoskeletal: No chest wall abnormality. No acute osseous findings. CT ABDOMEN PELVIS FINDINGS Hepatobiliary: No focal liver abnormality is seen. Status post cholecystectomy. No biliary dilatation. Pancreas: Unremarkable. No pancreatic ductal dilatation or surrounding inflammatory changes. Spleen: Normal in size without significant abnormality. Adrenals/Urinary Tract: Adrenal glands are unremarkable. Kidneys are normal, without renal calculi, solid lesion, or hydronephrosis. Severely distended urinary bladder, maximum coronal span 21.7 cm (series 7, image 120). Stomach/Bowel: Stomach is within normal limits. Appendix appears normal. No evidence of bowel wall thickening, distention, or inflammatory changes. Vascular/Lymphatic: Aortic atherosclerosis. No enlarged abdominal or pelvic lymph nodes. Reproductive: Large, heterogeneous mixed solid and cystic mass occupying the endometrial cavity and endocervical canal, measuring 9.6 x 5.8 x 5.7 cm (series 7, image 116, series 2, image 76). Other: No abdominal wall hernia or abnormality. No ascites. Musculoskeletal: No acute osseous findings. IMPRESSION: 1. Large, heterogeneous mixed solid and cystic mass occupying the endometrial cavity and endocervical canal, measuring 9.6 x 5.8 x 5.7 cm, consistent with primary endometrial malignancy. 2. No evidence of lymphadenopathy or metastatic disease in the chest, abdomen, or  pelvis. 3. Severely distended urinary bladder, maximum coronal span 21.7 cm similar to prior examination. Correlate for urinary retention. 4. Coronary artery disease. Aortic Atherosclerosis (ICD10-I70.0). Electronically Signed   By: Marolyn JONETTA Jaksch M.D.   On: 07/14/2024 09:38    Labs:  CBC: Recent Labs    01/04/24 1434 07/19/24 1333  WBC 5.9 7.9  HGB 11.2* 10.1*  HCT 34.9* 32.7*  PLT 326.0 334    COAGS: No results for input(s): INR, APTT in the last 8760 hours.  BMP: Recent Labs    01/04/24 1434 07/19/24 1333  NA 136 136  K 4.1 4.8  CL 101 102  CO2 28 24  GLUCOSE 207* 254*  BUN 12 15  CALCIUM  10.0 9.9  CREATININE 1.05 1.18*  GFRNONAA  --  47*    LIVER FUNCTION TESTS: Recent Labs    07/19/24 1333  BILITOT 0.5  AST 25  ALT 17  ALKPHOS 72  PROT 7.1  ALBUMIN 4.0    TUMOR MARKERS: No results for input(s): AFPTM, CEA, CA199, CHROMGRNA in the last 8760 hours.  Assessment and Plan: 78 y.o. female, Kalvin witness, with past medical history significant for diabetes, GERD, hypertension, hyperlipidemia, prior tracheostomy for subglottic stenosis in 2021, thyroid  nodules who presents now with recently diagnosed endometrial/uterine cancer, s/p robotic-assisted laparoscopic total hysterectomy with bilateral salpingoophorectomy, SLN biopsy bilaterally, infracolic omentectomy on 07/20/24 .  She is scheduled today for Port-A-Cath placement to assist with treatment.Risks and benefits of image guided port-a-catheter placement was discussed with the patient including, but not limited to bleeding, infection, pneumothorax, or fibrin sheath development and need for additional procedures.  All of the patient's questions were answered, patient is agreeable to proceed. Consent signed and in chart.    Thank you for allowing our service to participate in Frankye Schwegel Taney 's care.  Electronically Signed: D. Franky Rakers, PA-C   08/10/2024, 4:19 PM      I spent a total  of  20 minutes   in face to face in clinical consultation, greater than 50% of which was counseling/coordinating care for port a cath placement

## 2024-08-10 NOTE — Telephone Encounter (Signed)
 Patient Assistance  Medication:Ozempic  Dosage:1 mg Quantity:1  Hazem Kenner,RMA

## 2024-08-11 ENCOUNTER — Ambulatory Visit (HOSPITAL_COMMUNITY)
Admission: RE | Admit: 2024-08-11 | Discharge: 2024-08-11 | Disposition: A | Discharge status: Home / Self Care | Source: Ambulatory Visit | Attending: Hematology and Oncology | Admitting: Hematology and Oncology

## 2024-08-11 ENCOUNTER — Other Ambulatory Visit: Payer: Self-pay

## 2024-08-11 ENCOUNTER — Encounter (HOSPITAL_COMMUNITY): Payer: Self-pay

## 2024-08-11 DIAGNOSIS — K219 Gastro-esophageal reflux disease without esophagitis: Secondary | ICD-10-CM | POA: Insufficient documentation

## 2024-08-11 DIAGNOSIS — E119 Type 2 diabetes mellitus without complications: Secondary | ICD-10-CM | POA: Insufficient documentation

## 2024-08-11 DIAGNOSIS — Z794 Long term (current) use of insulin: Secondary | ICD-10-CM | POA: Insufficient documentation

## 2024-08-11 DIAGNOSIS — Z90722 Acquired absence of ovaries, bilateral: Secondary | ICD-10-CM | POA: Diagnosis not present

## 2024-08-11 DIAGNOSIS — E785 Hyperlipidemia, unspecified: Secondary | ICD-10-CM | POA: Diagnosis not present

## 2024-08-11 DIAGNOSIS — Z7985 Long-term (current) use of injectable non-insulin antidiabetic drugs: Secondary | ICD-10-CM | POA: Insufficient documentation

## 2024-08-11 DIAGNOSIS — Z90710 Acquired absence of both cervix and uterus: Secondary | ICD-10-CM | POA: Diagnosis not present

## 2024-08-11 DIAGNOSIS — I10 Essential (primary) hypertension: Secondary | ICD-10-CM | POA: Diagnosis not present

## 2024-08-11 DIAGNOSIS — Z79899 Other long term (current) drug therapy: Secondary | ICD-10-CM | POA: Insufficient documentation

## 2024-08-11 DIAGNOSIS — C541 Malignant neoplasm of endometrium: Secondary | ICD-10-CM | POA: Diagnosis present

## 2024-08-11 DIAGNOSIS — E042 Nontoxic multinodular goiter: Secondary | ICD-10-CM | POA: Diagnosis not present

## 2024-08-11 HISTORY — PX: IR IMAGING GUIDED PORT INSERTION: IMG5740

## 2024-08-11 LAB — GLUCOSE, CAPILLARY: Glucose-Capillary: 153 mg/dL — ABNORMAL HIGH (ref 70–99)

## 2024-08-11 MED ORDER — MIDAZOLAM HCL (PF) 2 MG/2ML IJ SOLN
INTRAMUSCULAR | Status: AC | PRN
  Administered 2024-08-11: 1 mg via INTRAVENOUS

## 2024-08-11 MED ORDER — HEPARIN SOD (PORK) LOCK FLUSH 100 UNIT/ML IV SOLN
500.0000 [IU] | Freq: Once | INTRAVENOUS | Status: AC
  Administered 2024-08-11: 500 [IU] via INTRAVENOUS

## 2024-08-11 MED ORDER — MIDAZOLAM HCL 2 MG/2ML IJ SOLN
INTRAMUSCULAR | Status: AC
  Filled 2024-08-11: qty 2

## 2024-08-11 MED ORDER — HEPARIN SOD (PORK) LOCK FLUSH 100 UNIT/ML IV SOLN
INTRAVENOUS | Status: AC
  Filled 2024-08-11: qty 5

## 2024-08-11 MED ORDER — FENTANYL CITRATE (PF) 100 MCG/2ML IJ SOLN
INTRAMUSCULAR | Status: AC | PRN
  Administered 2024-08-11: 50 ug via INTRAVENOUS

## 2024-08-11 MED ORDER — LIDOCAINE HCL 1 % IJ SOLN
INTRAMUSCULAR | Status: AC
Start: 2024-08-11 — End: 2024-08-11
  Filled 2024-08-11: qty 20

## 2024-08-11 MED ORDER — LIDOCAINE HCL 1 % IJ SOLN
20.0000 mL | Freq: Once | INTRAMUSCULAR | Status: AC
  Administered 2024-08-11: 20 mL via INTRADERMAL

## 2024-08-11 MED ORDER — SODIUM CHLORIDE 0.9 % IV SOLN: INTRAVENOUS | Status: DC

## 2024-08-11 MED ORDER — FENTANYL CITRATE (PF) 100 MCG/2ML IJ SOLN
INTRAMUSCULAR | Status: AC
  Filled 2024-08-11: qty 2

## 2024-08-11 NOTE — Discharge Instructions (Signed)
 Please call Interventional Radiology clinic [phone] with any questions or concerns.  You may remove your dressing and shower tomorrow.  After the procedure, it is common to have: Discomfort at the port insertion site. Bruising on the skin over the port. This should improve over 3-4 days  Follow these instructions at home:  Medication: Do not use Aspirin or ibuprofen products, such as Advil or Motrin, as it may increase bleeding.  You may resume your usual medications as ordered by your doctor. If your doctor prescribed antibiotics, take them as directed. Do not stop taking them just because you feel better. You need to take the full course of antibiotics.  Eating and drinking: Drink plenty of liquids to keep your urine pale yellow You can resume your regular diet as directed by your doctor   Care of the procedure site Follow instructions from your health care provider about how to take care of your port insertion site. Make sure you: After your port is placed, you will get a manufacturer's information card. The card has information about your port. Keep this card with you at all times Make sure to remember what type of port you have Take care of the port as told by your health care provider DO NOT use EMLA cream for 2 weeks after port placement -the cream will remove the surgical glue on your incision DO NOT use any lotions, creams, or ointments on incision for 2 weeks. This will remove the surgical glue on your incision Wash your hands with soap and water before and after you change your bandage (dressing). If soap and water are not available, use hand sanitizer Change your dressing as told by your health care provider Leave skin glue, or adhesive strips in place. These skin closures may need to stay in place for 2 weeks or longer Check your port insertion site every day for signs of infection. Check for: Redness, swelling, or pain Fluid or blood Warmth Pus or a bad  smell  Activity Return to your normal activities as told by your health care provider. Ask your health care provider what activities are safe for you Do not lift anything that is heavier than 10 lb (4.5 kg), or the limit that you are told, until your health care provider says that it is safe Do not take baths, swim, or use a hot tub until your health care provider approves. Take showers only. Keep all follow-up visits as told by your doctor  Contact a health care provider if: You cannot flush your port with saline as directed, or you cannot draw blood from the port You have a fever or chills You have redness, swelling, or pain around your port insertion site You have fluid or blood coming from your port insertion site Your port insertion site feels warm to the touch You have pus or a bad smell coming from the port insertion site  Get help right away if: You have chest pain or shortness of breath You have bleeding from your port that you cannot control  Moderate Conscious Sedation-Care After  This sheet gives you information about how to care for yourself after your procedure. Your health care provider may also give you more specific instructions. If you have problems or questions, contact your health care provider.  After the procedure, it is common to have: Sleepiness for several hours. Impaired judgment for several hours. Difficulty with balance. Vomiting if you eat too soon.  Follow these instructions at home:  Rest. Do not  participate in activities where you could fall or become injured. Do not drive or use machinery. Do not drink alcohol. Do not take sleeping pills or medicines that cause drowsiness. Do not make important decisions or sign legal documents. Do not take care of children on your own.  Eating and drinking Follow the diet recommended by your health care provider. Drink enough fluid to keep your urine pale yellow. If you vomit: Drink water, juice, or soup  when you can drink without vomiting. Make sure you have little or no nausea before eating solid foods.  General instructions Take over-the-counter and prescription medicines only as told by your health care provider. Have a responsible adult stay with you for the time you are told. It is important to have someone help care for you until you are awake and alert. Do not smoke. Keep all follow-up visits as told by your health care provider. This is important.  Contact a health care provider if: You are still sleepy or having trouble with balance after 24 hours. You feel light-headed. You keep feeling nauseous or you keep vomiting. You develop a rash. You have a fever. You have redness or swelling around the IV site.  Get help right away if: You have trouble breathing. You have new-onset confusion at home.  This information is not intended to replace advice given to you by your health care provider. Make sure you discuss any questions you have with your healthcare provider.

## 2024-08-11 NOTE — Procedures (Signed)
 Interventional Radiology Procedure Note  Procedure: Single Lumen Power Port Placement    Access:  Right IJ vein.  Findings: Catheter tip positioned at SVC/RA junction. Port is ready for immediate use.   Complications: None  EBL: < 10 mL  Recommendations:  - Ok to shower in 24 hours - Do not submerge for 7 days - Routine line care   [name] T. Fredia Sorrow, M.D Pager:  [phone]

## 2024-08-11 NOTE — Progress Notes (Signed)
 1800 Ice pack given to use to right upper neck and right upper chest as instructed.

## 2024-08-11 NOTE — Sedation Documentation (Signed)
 RN Adyan Palau pulled 2mg  Versed  and 100mcg Fentanyl  in IR room pysix.Pt. Received 2mg  Versed  and 100 mcg Fentanyl  throughout the procedure.

## 2024-08-12 ENCOUNTER — Inpatient Hospital Stay

## 2024-08-12 ENCOUNTER — Encounter: Payer: Self-pay | Admitting: Internal Medicine

## 2024-08-12 NOTE — Progress Notes (Signed)
 Pharmacist Chemotherapy Monitoring - Initial Assessment    Anticipated start date: 08/19/2024   The following has been reviewed per standard work regarding the patient's treatment regimen: The patient's diagnosis, treatment plan and drug doses, and organ/hematologic function Lab orders and baseline tests specific to treatment regimen  The treatment plan start date, drug sequencing, and pre-medications Prior authorization status  Patient's documented medication list, including drug-drug interaction screen and prescriptions for anti-emetics and supportive care specific to the treatment regimen The drug concentrations, fluid compatibility, administration routes, and timing of the medications to be used The patient's access for treatment and lifetime cumulative dose history, if applicable  The patient's medication allergies and previous infusion related reactions, if applicable   Changes made to treatment plan:  Provider removed Day 3 pegfilgrastim from treatment plan   Follow up needed:  Pending authorization for treatment   Bridget Butler, PharmD Pharmacy Resident  08/12/2024 8:20 AM

## 2024-08-12 NOTE — Progress Notes (Signed)
 [facility] Clinical Social Work  Initial Assessment   Bridget Butler is a 78 y.o. year old female contacted by phone. Clinical Social Work was referred by new patient protocol for assessment of psychosocial needs.   SDOH (Social Determinants of Health) assessments performed: No SDOH Interventions    Flowsheet Row Clinical Support from 09/04/2023 in [facility] Clinical Support from 09/15/2022 in [facility] Clinical Support from 09/11/2021 in [facility] [place]  SDOH Interventions     Food Insecurity Interventions Intervention Not Indicated Intervention Not Indicated Intervention Not Indicated  Housing Interventions Intervention Not Indicated Intervention Not Indicated Intervention Not Indicated  Transportation Interventions Intervention Not Indicated Intervention Not Indicated Intervention Not Indicated  Utilities Interventions Intervention Not Indicated Intervention Not Indicated --  Alcohol Usage Interventions Intervention Not Indicated (Score <7) Intervention Not Indicated (Score <7) --  Depression Interventions/Treatment  PHQ2-9 Score <4 Follow-up Not Indicated -- --  Financial Strain Interventions Intervention Not Indicated -- Intervention Not Indicated  Physical Activity Interventions Intervention Not Indicated Intervention Not Indicated Intervention Not Indicated  Stress Interventions Intervention Not Indicated Intervention Not Indicated --  Social Connections Interventions Intervention Not Indicated Intervention Not Indicated Intervention Not Indicated  Health Literacy Interventions Intervention Not Indicated -- --    SDOH Screenings   Food Insecurity: No Food Insecurity (08/02/2024)  Housing: Unknown (08/02/2024)  Transportation Needs: No Transportation Needs (08/02/2024)  Utilities: Not At Risk (08/02/2024)  Alcohol Screen: Low Risk  (09/04/2023)  Depression (PHQ2-9): Low Risk   (08/02/2024)  Financial Resource Strain: Low Risk  (09/04/2023)  Physical Activity: Insufficiently Active (09/04/2023)  Social Connections: Moderately Integrated (09/04/2023)  Stress: No Stress Concern Present (09/04/2023)  Tobacco Use: Low Risk  (08/11/2024)  Health Literacy: Adequate Health Literacy (09/04/2023)    PHQ 2/9:    08/02/2024    1:53 PM 07/18/2024    2:53 PM 01/04/2024    1:50 PM  Depression screen PHQ 2/9  Decreased Interest 0 0 0  Down, Depressed, Hopeless 0 0 0  PHQ - 2 Score 0 0 0     Distress Screen completed: No     No data to display            Family/Social Information:  Housing Arrangement: patient lives with adult sonand daughter. Daughter from [place]  has FMLA, travels back and forth to accompany pt to appointments. Family members/support persons in your life? Family, Friends, and [employer] Financial concerns: no  Employment: Retired .  Income source: Actor concerns: No Type of concern: None Food access concerns: no Religious or spiritual practice: Yes-Jehovas Witness Advanced directives: Scientist, research (physical sciences) Currently in place:  [employer]  Coping/ Adjustment to diagnosis: Patient understands treatment plan and what happens next? yes Concerns about diagnosis and/or treatment: I'm not especially worried about anything and looking forward to chemo ed next week to have questions answered. Patient reported stressors: Adjusting to my illness Patient enjoys Reading the bible, playing games on her phone Current coping skills/ strengths: Average or above average intelligence , Motivation for treatment/growth , Religious Affiliation , and Supportive family/friends     SUMMARY: Current SDOH Barriers:  None identified at this time.  Clinical Social Work Clinical Goal(s):  No clinical social work goals at this time  Interventions: Discussed common feeling and emotions when being diagnosed with  cancer, and the importance of support during treatment Informed patient of the support team roles and support services  at [facility] Provided CSW contact information and encouraged patient to call with any questions or concerns   Follow Up Plan: CSW will follow-up with patient by phone  Patient verbalizes understanding of plan: Yes    Thersia KATHEE Daring Clinical Social Worker [facility]

## 2024-08-18 ENCOUNTER — Inpatient Hospital Stay

## 2024-08-18 ENCOUNTER — Encounter: Payer: Self-pay | Admitting: Gynecologic Oncology

## 2024-08-18 ENCOUNTER — Encounter: Payer: Self-pay | Admitting: Hematology and Oncology

## 2024-08-18 ENCOUNTER — Inpatient Hospital Stay (HOSPITAL_BASED_OUTPATIENT_CLINIC_OR_DEPARTMENT_OTHER): Admitting: Gynecologic Oncology

## 2024-08-18 ENCOUNTER — Inpatient Hospital Stay: Admitting: Hematology and Oncology

## 2024-08-18 VITALS — BP 136/58 | HR 95 | Temp 98.3°F | Resp 18 | Wt 171.6 lb

## 2024-08-18 VITALS — BP 147/55 | HR 104 | Resp 18 | Ht 63.0 in | Wt 176.6 lb

## 2024-08-18 DIAGNOSIS — N1832 Chronic kidney disease, stage 3b: Secondary | ICD-10-CM | POA: Diagnosis not present

## 2024-08-18 DIAGNOSIS — K5909 Other constipation: Secondary | ICD-10-CM

## 2024-08-18 DIAGNOSIS — C541 Malignant neoplasm of endometrium: Secondary | ICD-10-CM

## 2024-08-18 DIAGNOSIS — Z9079 Acquired absence of other genital organ(s): Secondary | ICD-10-CM

## 2024-08-18 DIAGNOSIS — Z794 Long term (current) use of insulin: Secondary | ICD-10-CM

## 2024-08-18 DIAGNOSIS — Z5111 Encounter for antineoplastic chemotherapy: Secondary | ICD-10-CM | POA: Diagnosis not present

## 2024-08-18 DIAGNOSIS — Z7189 Other specified counseling: Secondary | ICD-10-CM

## 2024-08-18 DIAGNOSIS — Z90722 Acquired absence of ovaries, bilateral: Secondary | ICD-10-CM

## 2024-08-18 DIAGNOSIS — Z90710 Acquired absence of both cervix and uterus: Secondary | ICD-10-CM

## 2024-08-18 DIAGNOSIS — E1122 Type 2 diabetes mellitus with diabetic chronic kidney disease: Secondary | ICD-10-CM

## 2024-08-18 LAB — CBC WITH DIFFERENTIAL (CANCER CENTER ONLY)
Abs Immature Granulocytes: 0.01 K/uL (ref 0.00–0.07)
Basophils Absolute: 0 K/uL (ref 0.0–0.1)
Basophils Relative: 1 %
Eosinophils Absolute: 0.1 K/uL (ref 0.0–0.5)
Eosinophils Relative: 2 %
HCT: 29.9 % — ABNORMAL LOW (ref 36.0–46.0)
Hemoglobin: 10 g/dL — ABNORMAL LOW (ref 12.0–15.0)
Immature Granulocytes: 0 %
Lymphocytes Relative: 33 %
Lymphs Abs: 2.1 K/uL (ref 0.7–4.0)
MCH: 31.5 pg (ref 26.0–34.0)
MCHC: 33.4 g/dL (ref 30.0–36.0)
MCV: 94.3 fL (ref 80.0–100.0)
Monocytes Absolute: 0.5 K/uL (ref 0.1–1.0)
Monocytes Relative: 7 %
Neutro Abs: 3.7 K/uL (ref 1.7–7.7)
Neutrophils Relative %: 57 %
Platelet Count: 284 K/uL (ref 150–400)
RBC: 3.17 MIL/uL — ABNORMAL LOW (ref 3.87–5.11)
RDW: 13.2 % (ref 11.5–15.5)
WBC Count: 6.5 K/uL (ref 4.0–10.5)
nRBC: 0 % (ref 0.0–0.2)

## 2024-08-18 LAB — CMP (CANCER CENTER ONLY)
ALT: 19 U/L (ref 0–44)
AST: 20 U/L (ref 15–41)
Albumin: 3.9 g/dL (ref 3.5–5.0)
Alkaline Phosphatase: 76 U/L (ref 38–126)
Anion gap: 6 (ref 5–15)
BUN: 24 mg/dL — ABNORMAL HIGH (ref 8–23)
CO2: 28 mmol/L (ref 22–32)
Calcium: 10.1 mg/dL (ref 8.9–10.3)
Chloride: 102 mmol/L (ref 98–111)
Creatinine: 1.01 mg/dL — ABNORMAL HIGH (ref 0.44–1.00)
GFR, Estimated: 57 mL/min — ABNORMAL LOW (ref >60)
Glucose, Bld: 357 mg/dL — ABNORMAL HIGH (ref 70–99)
Potassium: 4.2 mmol/L (ref 3.5–5.1)
Sodium: 136 mmol/L (ref 135–145)
Total Bilirubin: 0.4 mg/dL (ref 0.0–1.2)
Total Protein: 7.5 g/dL (ref 6.5–8.1)

## 2024-08-18 MED FILL — Fosaprepitant Dimeglumine For IV Infusion 150 MG (Base Eq): INTRAVENOUS | Qty: 5 | Status: AC

## 2024-08-18 NOTE — Patient Instructions (Addendum)
 It was good to see you today.  Please remember, no heavy lifting for 6 weeks and nothing in the vagina for 12 weeks.  You are healing very well from surgery!  Plan on follow up in six months or sooner if needed.  You have been scheduled to meet with a genetics counselor as well.   Monitor your blood glucose. Today it was 357 which is significantly high.  Remember we talked about how this can impair your healing from surgery and potentially be very dangerous.

## 2024-08-18 NOTE — Assessment & Plan Note (Addendum)
We discussed importance of dietary modification while on treatment 

## 2024-08-18 NOTE — Telephone Encounter (Signed)
 Patient picked up patient assistance - Log Noted [place] and Ozempic  on 08/18/24

## 2024-08-18 NOTE — Progress Notes (Signed)
 [facility] OFFICE PROGRESS NOTE  Patient Care Team: Joshua Debby CROME, MD as PCP - General (Internal Medicine) Jarold Mayo, MD as Consulting Physician (Ophthalmology) Trixie File, MD as Consulting Physician (Internal Medicine) Dallie Vera GAILS, MD as Consulting Physician (Obstetrics and Gynecology)  Assessment & Plan Endometrial cancer [facility]) The patient was diagnosed with uterine cancer after presentation of postmenopausal bleeding CT imaging showed large uterine mass and severely distended urinary bladder She had hysterectomy performed on July 20, 2024 and did well postoperatively without complications Final pathology: 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    [name], MD  INTERVAL HISTORY: she returns for surveillance follow-up prior to cycle 1 of treatment She had completed port placement without major difficulties I reviewed her documented flowsheet She did not write down the type of food she eats Her blood sugar control has improved somewhat with low-carb diet She denies recent constipation  PHYSICAL EXAMINATION: ECOG PERFORMANCE STATUS: 1 - Symptomatic but completely ambulatory  Vitals:   08/18/24 1301  BP: (!) 147/55  Pulse: (!) 104  Resp: 18  SpO2: 100%   Filed Weights   08/18/24 1301  Weight: 176 lb 9.6 oz (80.1 kg)    Relevant data reviewed during this  visit included CBC and CMP

## 2024-08-18 NOTE — Progress Notes (Signed)
 Gynecologic Oncology Return Clinic Visit  08/18/24  Reason for Visit: treatment planning, follow-up  Treatment History: Oncology History Overview Note  High grade serous, MMR normal, Her2 (0+) negative   Endometrial cancer (HCC)  01/2024 Initial Diagnosis   The patient was initially seen in April with a history of 1 year of intermittent vaginal bleeding. She was also noted to have mixed urinary incontinence and pelvic prolapse at that visit. Pelvic ultrasound in May 2025 showed a uterus measuring 9.9 x 7.1 x 7.8 cm with an endometrial lining of 57 mm, fluid filled with multiple vascular masses.    06/29/2024 Pathology Results   SURGICAL PATHOLOGY  CASE: [id number]  PATIENT: Bridget Butler  Surgical Pathology Report   Clinical History: PMB; large amount of yellow fluid followed by endometrial tissue (crm)   FINAL MICROSCOPIC DIAGNOSIS:   A. ENDOMETRIUM, CURETTAGE:       Serous carcinoma of endometrium.       See comment.   COMMENT:   Immunohistochemical stain for p53 shows mutant type of staining / null. ER is patchy positive. The findings are consistent with serous carcinoma of endometrium.   Mismatch Repair Protein (IHC)   SUMMARY INTERPRETATION: NORMAL    07/12/2024 Imaging   1. Large, heterogeneous mixed solid and cystic mass occupying the endometrial cavity and endocervical canal, measuring 9.6 x 5.8 x 5.7 cm, consistent with primary endometrial malignancy. 2. No evidence of lymphadenopathy or metastatic disease in the chest, abdomen, or pelvis. 3. Severely distended urinary bladder, maximum coronal span 21.7 cm similar to prior examination. Correlate for urinary retention. 4. Coronary artery disease.   Aortic Atherosclerosis (ICD10-I70.0).   07/20/2024 Initial Diagnosis   Endometrial cancer (HCC)   07/20/2024 Surgery   Operation: Robotic-assisted laparoscopic total hysterectomy with bilateral salpingoophorectomy, SLN biopsy bilaterally, infracolic omentectomy    Surgeon: Viktoria Crank MD     Operative Findings: On EUA, 12 cm mobile uterus, cervix prolapsed to the introitus. On intra-abdominal entry, minimal adhesive disease from prior cholecystectomy. Normal appearing omentum, small and large bowel. Uterus 10 cm and boggy. Normal appearing bilateral tubes and ovaries. No ascites. Mapping successful to bilateral external iliac vein SLNs. On the right, two channels also noted within the obturator space although terminated at two lymph nodes that were not green (removed as obturator SLNs). No obvious extra-uterine evidence of disease. Bladder wall quite redundant.   07/20/2024 Surgery   Exam under anesthesia, robotic assisted laparoscopic uterosacral ligament suspension, possible posterior repair, cystourethroscopy with bladder biopsy    Attending Surgeon: Lianne Leila Gillis MD    Findings: 1. On vaginal exam, stage II prolapse present                2. On cystoscopy, erythematous bladder mucosa in and above trigone at midline at bladder dome with 2 biopsies taken at the bladder dome. Normal urethral mucosa without injury or lesion. Brisk bilateral ureteral efflux present.     07/20/2024 Pathology Results   SURGICAL PATHOLOGY  CASE: [id number]  PATIENT: Bridget Butler  Surgical Pathology Report   Clinical History: endometrial cancer   FINAL MICROSCOPIC DIAGNOSIS:   A. RIGHT OBTURATOR SENTINEL LYMPH NODE BIOPSY:  - Lymph node, negative for carcinoma (0/1)   B. LEFT EXTERNAL ILIAC VEIN SENTINEL LYMPH NODE BIOPSY:  - Lymph node, negative for carcinoma (0/1)   C. UTERUS, CERVIX, BILATERAL FALLOPIAN TUBES AND OVARIES:  - High-grade serous carcinoma, involving anterior and posterior endometrium  - Carcinoma invades more than 50% of the myometrium  - The  cervical surface is involved but definite stromal involvement is not seen  - No evidence of lymphovascular invasion  - Benign unremarkable bilateral ovaries and fallopian tubes  - See oncology table   - See comment   D. OMENTUM: - Portion of omentum, negative for carcinoma  E. RIGHT EXTERNAL ILIAC VEIN SENTINEL LYMPH NODE BIOPSY: - Lymph node, negative for carcinoma (0/1)  F. BLADDER BIOPSY: - Bladder mucosa with mild acute inflammation and reactive changes - Negative for dysplasia or carcinoma The predominant invasion into myometrium is seen in the posterior lower uterine segment.  ONCOLOGY TABLE:  UTERUS, CARCINOMA OR CARCINOSARCOMA: Resection  Procedure: Total hysterectomy and bilateral salpingo-oophorectomy Histologic Type: Serous carcinoma Histologic Grade: High-grade Myometrial Invasion:      Depth of Myometrial Invasion (mm): Approximately 10 mm (in the posterior lower uterine segment)      Myometrial Thickness (mm): Approximately 14 mm      Percentage of Myometrial Invasion: 70% Uterine Serosa Involvement: Not identified Cervical stromal Involvement: Not identified Extent of involvement of other tissue/organs: Not identified Peritoneal/Ascitic Fluid: Not involved Lymphovascular Invasion: Not identified Regional Lymph Nodes:      Pelvic Lymph Nodes Examined:                                  3 Sentinel                                  0 Non-sentinel                                  3 Total      Pelvic Lymph Nodes with Metastasis: 0                          Macrometastasis: (>2.0 mm): 0                          Micrometastasis: (>0.2 mm and < 2.0 mm): 0                          Isolated Tumor Cells (<0.2 mm): 0                          Laterality of Lymph Node with Tumor: Not applicable                          Extracapsular Extension: Not applicable      Para-aortic Lymph Nodes Examined:                                   0 Sentinel                                   0 non-sentinel                                   0 total Distant Metastasis:      Distant Site(s) Involved: Not  applicable      07/29/2024 Cancer Staging   Staging form: Corpus Uteri -  Carcinoma and Carcinosarcoma, AJCC 8th Edition and FIGO 2023 - Pathologic stage from 07/29/2024: FIGO Stage II, calculated as Stage I (pT1, pN0, cM0, MMRd-) - Signed by Lonn Hicks, MD on 07/29/2024 Stage prefix: Initial diagnosis   08/12/2024 Procedure   Placement of single lumen port a cath via right internal jugular vein. The catheter tip lies at the cavo-atrial junction. A power injectable port a cath was placed and is ready for immediate use.   08/19/2024 -  Chemotherapy   Patient is on Treatment Plan : UTERINE Carboplatin + Paclitaxel (5/135) q21d       Interval History: Doing well.  Using Catheter during the day with voids every 3-4 hours and attaches the catheter to the bag at night.  Reports occasional brown spotting.  Endorses regular bowel movements, using MiraLAX  and Senokot.  Denies any significant abdominal pain.  Past Medical/Surgical History: Past Medical History:  Diagnosis Date   Cancer (HCC)    Cataract    had surgery   Diabetes mellitus without complication (HCC)    GERD (gastroesophageal reflux disease)    Hyperlipidemia    Hypertension    Multiple thyroid  nodules    Thyroid  disease     Past Surgical History:  Procedure Laterality Date   ABDOMINAL HYSTERECTOMY  07/20/2024   ANTERIOR AND POSTERIOR REPAIR N/A 07/20/2024   Procedure: POSTERIOR REPAIR (RECTOCELE);  Surgeon: Guadlupe Lianne DASEN, MD;  Location: [facility];  Service: Gynecology;  Laterality: N/A;  possible posterior repair   CHOLECYSTECTOMY     COSMETIC SURGERY Bilateral    eyelids   CYSTOSCOPY N/A 07/20/2024   Procedure: CYSTOSCOPY;  Surgeon: Guadlupe Lianne DASEN, MD;  Location: [facility];  Service: Gynecology;  Laterality: N/A;   EYE SURGERY     GALLBLADDER SURGERY     INJECTION, FOR SENTINEL LYMPH NODE IDENTIFICATION N/A 07/20/2024   Procedure: INJECTION, FOR SENTINEL LYMPH NODE IDENTIFICATION;  Surgeon: Viktoria Comer SAUNDERS, MD;  Location: [facility];  Service: Gynecology;  Laterality: N/A;   IR IMAGING GUIDED PORT  INSERTION  08/11/2024   LAPAROSCOPY, OMENTECTOMY N/A 07/20/2024   Procedure: LAPAROSCOPY, OMENTECTOMY;  Surgeon: Viktoria Comer SAUNDERS, MD;  Location: [facility];  Service: Gynecology;  Laterality: N/A;   LYMPH NODE BIOPSY N/A 07/20/2024   Procedure: LYMPH NODE BIOPSY;  Surgeon: Viktoria Comer SAUNDERS, MD;  Location: [facility];  Service: Gynecology;  Laterality: N/A;   MICROLARYNGOSCOPY WITH CO2 LASER AND EXCISION OF VOCAL CORD LESION N/A 04/02/2020   Procedure: MICROLARYNGOSCOPY WITH DILATION AND CO2 LASER AND EXCISION OF VOCAL CORD LESION w/MITOMYCIN  C;  Surgeon: Carlie Clark, MD;  Location: [facility] OR;  Service: ENT;  Laterality: N/A;   MICROLARYNGOSCOPY WITH DILATION N/A 05/23/2020   Procedure: MICROLARYNGOSCOPY W/ DILATION AND JET VENTILATION;  Surgeon: Carlie Clark, MD;  Location: [facility] OR;  Service: ENT;  Laterality: N/A;   MICROLARYNGOSCOPY WITH DILATION N/A 07/13/2020   Procedure: MICROLARYNGOSCOPY with Jet Ventilation;  Surgeon: Carlie Clark, MD;  Location: [facility] OR;  Service: ENT;  Laterality: N/A;   ROBOTIC ASSISTED TOTAL HYSTERECTOMY WITH BILATERAL SALPINGO OOPHERECTOMY Bilateral 07/20/2024   Procedure: HYSTERECTOMY, TOTAL, ROBOT-ASSISTED, LAPAROSCOPIC, WITH BILATERAL SALPINGO-OOPHORECTOMY;  Surgeon: Viktoria Comer SAUNDERS, MD;  Location: [facility];  Service: Gynecology;  Laterality: Bilateral;   TRACHEOSTOMY TUBE PLACEMENT N/A 04/02/2020   Procedure: Awake TRACHEOSTOMY;  Surgeon: Carlie Clark, MD;  Location: [facility] OR;  Service: ENT;  Laterality: N/A;   TUBAL LIGATION  UTEROSACRAL LIGAMENT SUSPENSION N/A 07/20/2024   Procedure: UTEROSACRAL LIGAMENT SUSPENSION;  Surgeon: Guadlupe Lianne DASEN, MD;  Location: [facility];  Service: Gynecology;  Laterality: N/A;    Family History  Problem Relation Age of Onset   Heart disease Mother    Hypertension Mother    Cancer Sister    Uterine cancer Sister    Diabetes Sister    Diabetes Sister    Diabetes Sister    Diabetes Sister    Diabetes Sister    Diabetes Brother     Cancer Maternal Aunt    Breast cancer Maternal Aunt    Breast cancer Maternal Aunt    Breast cancer Cousin    Prostate cancer Neg Hx    Ovarian cancer Neg Hx    Pancreatic cancer Neg Hx    Colon cancer Neg Hx    Renal cancer Neg Hx     Social History   Socioeconomic History   Marital status: Widowed    Spouse name: Not on file   Number of children: 3   Years of education: 47   Highest education level: Not on file  Occupational History   Occupation: Retired  Tobacco Use   Smoking status: Never    Passive exposure: Never   Smokeless tobacco: Never  Vaping Use   Vaping status: Never Used  Substance and Sexual Activity   Alcohol use: No   Drug use: No   Sexual activity: Not Currently    Partners: Male    Birth control/protection: None  Other Topics Concern   Not on file  Social History Narrative   Bridget Butler is a 78 year old Jehovah witness patient    She receives support from her daughter primarily who also assists with transportation to medical appointments.    She is presently independent with her ADLs and iADLs      Lives with son and daughter      Social Drivers of Health   Financial Resource Strain: Low Risk  (09/04/2023)   Overall Financial Resource Strain (CARDIA)    Difficulty of Paying Living Expenses: Not hard at all  Food Insecurity: No Food Insecurity (08/02/2024)   Hunger Vital Sign    Worried About Running Out of Food in the Last Year: Never true    Ran Out of Food in the Last Year: Never true  Transportation Needs: No Transportation Needs (08/02/2024)   PRAPARE - Administrator, Civil Service (Medical): No    Lack of Transportation (Non-Medical): No  Physical Activity: Insufficiently Active (09/04/2023)   Exercise Vital Sign    Days of Exercise per Week: 5 days    Minutes of Exercise per Session: 20 min  Stress: No Stress Concern Present (09/04/2023)   [employer] of Occupational Health - Occupational Stress Questionnaire     Feeling of Stress : Not at all  Social Connections: Moderately Integrated (09/04/2023)   Social Connection and Isolation Panel    Frequency of Communication with Friends and Family: More than three times a week    Frequency of Social Gatherings with Friends and Family: More than three times a week    Attends Religious Services: More than 4 times per year    Active Member of [employer] or Organizations: Yes    Attends Banker Meetings: 1 to 4 times per year    Marital Status: Widowed    Current Medications:  Current Outpatient Medications:    amLODipine  (NORVASC ) 5 MG tablet, Take 1 tablet by  mouth once daily, Disp: 90 tablet, Rfl: 0   aspirin  81 MG chewable tablet, Chew 81 mg by mouth daily., Disp: , Rfl:    Blood Glucose Monitoring Suppl (TRUE METRIX METER) DEVI, 1 Device by Does not apply route 2 (two) times daily., Disp: 1 each, Rfl: 2   calcium  carbonate (OSCAL) 1500 (600 Ca) MG TABS tablet, Take 600 mg of elemental calcium  by mouth daily with breakfast., Disp: , Rfl:    Cholecalciferol  (VITAMIN D3) 125 MCG (5000 UT) CAPS, Take 5,000 Units by mouth daily., Disp: , Rfl:    CINNAMON PO, Take 2,000 mg by mouth daily., Disp: , Rfl:    Continuous Glucose Sensor (FREESTYLE LIBRE 3 PLUS SENSOR) MISC, 1 each by Does not apply route every 14 (fourteen) days., Disp: 6 each, Rfl: 3   dexamethasone  (DECADRON ) 4 MG tablet, 2 tab the morning of chemotherapy, every 3 weeks, by mouth x 6 cycles, Disp: 12 tablet, Rfl: 6   insulin  degludec (TRESIBA) 200 UNIT/ML FlexTouch Pen, Inject 20 Units into the skin daily., Disp: , Rfl:    Insulin  Pen Needle 32G X 6 MM MISC, 1 Act by Does not apply route once a week., Disp: 100 each, Rfl: 1   lidocaine -prilocaine (EMLA) cream, Apply to affected area once, Disp: 30 g, Rfl: 3   Multiple Vitamin (MULTIVITAMIN WITH MINERALS) TABS tablet, Take 1 tablet by mouth daily., Disp: , Rfl:    olmesartan  (BENICAR ) 20 MG tablet, Take 1 tablet (20 mg total) by mouth  daily., Disp: 90 tablet, Rfl: 0   omeprazole  (PRILOSEC) 40 MG capsule, TAKE 1 CAPSULE BY MOUTH TWICE DAILY IN THE MORNING AND AT BEDTIME, Disp: 180 capsule, Rfl: 0   ondansetron  (ZOFRAN ) 8 MG tablet, Take 1 tablet (8 mg total) by mouth every 8 (eight) hours as needed for nausea or vomiting. Start on the third day after chemotherapy., Disp: 30 tablet, Rfl: 1   polyethylene glycol (MIRALAX ) 17 g packet, Take 17 g by mouth daily., Disp: , Rfl:    potassium chloride  (KLOR-CON  10) 10 MEQ tablet, Take 1 tablet (10 mEq total) by mouth 2 (two) times daily., Disp: 180 tablet, Rfl: 1   pravastatin  (PRAVACHOL ) 40 MG tablet, Take 1 tablet (40 mg total) by mouth at bedtime., Disp: 31 tablet, Rfl: 0   prochlorperazine (COMPAZINE) 10 MG tablet, Take 1 tablet (10 mg total) by mouth every 6 (six) hours as needed for nausea or vomiting., Disp: 30 tablet, Rfl: 1   Semaglutide ,0.25 or 0.5MG /DOS, (OZEMPIC , 0.25 OR 0.5 MG/DOSE,) 2 MG/3ML SOPN, Inject 0.5mg  into skin weekly (Patient not taking: No sig reported), Disp: , Rfl:    senna-docusate (SENOKOT-S) 8.6-50 MG tablet, Take 2 tablets by mouth at bedtime., Disp: , Rfl:   Review of Systems: Denies appetite changes, fevers, chills, fatigue, unexplained weight changes. Denies hearing loss, neck lumps or masses, mouth sores, ringing in ears or voice changes. Denies cough or wheezing.  Denies shortness of breath. Denies chest pain or palpitations. Denies leg swelling. Denies abdominal distention, pain, blood in stools, constipation, diarrhea, nausea, vomiting, or early satiety. Denies pain with intercourse, dysuria, frequency, hematuria or incontinence. Denies hot flashes, pelvic pain.   Denies joint pain, back pain or muscle pain/cramps. Denies itching, rash, or wounds. Denies dizziness, headaches, numbness or seizures. Denies swollen lymph nodes or glands, denies easy bruising or bleeding. Denies anxiety, depression, confusion, or decreased concentration.  Physical  Exam: BP (!) 160/68 (BP Location: Left Arm, Patient Position: Sitting)   Pulse 95  Temp 98.3 F (36.8 C) (Oral)   Resp 18   Wt 171 lb 9.6 oz (77.8 kg)   SpO2 100%   BMI 30.40 kg/m  General: Alert, oriented, no acute distress. HEENT: Posterior oropharynx clear, sclera anicteric. Chest: Unlabored breathing on room air. Abdomen: soft, nontender.  Normoactive bowel sounds.  No masses or hepatosplenomegaly appreciated.  Well-healed scar. Extremities: Grossly normal range of motion.  Warm, well perfused.  No edema bilaterally. Skin: No rashes or lesions noted. GU: Normal appearing external genitalia without erythema, excoriation, or lesions.  Speculum exam reveals cuff intact, suture visible, no blood or discharge.  Posteriorly, suture visible, no exudate or active bleeding. On bimanual exam, cuff is intact without fluctuance or tenderness.  Laboratory & Radiologic Studies: None new  Assessment & Plan: Moani Weipert [place] is a 78 y.o. woman with Stage IIC high grade endometrial cancer (serous on original biopsy but no specified on hysterectomy) who presents for follow-up. HER2 0+. MMRp. P53 mutated.   Doing well, meeting postoperative milestones.  Discussed continued expectations and limitations.  Given her history and family history, discussed recommendation for her to meet with our genetic counselors to discuss genetic testing.  Blood sugar was quite elevated today in the mid 300s.  Stressed the importance of continued good glycemic control so that she does not have postoperative complications.  Reviewed pathology with her from surgery again.  Patient was given a copy of her pathology report.  Given high-grade, high risk early stage endometrial cancer, discussed recommendation for adjuvant therapy with chemotherapy and vaginal brachytherapy.  The patient has seen Drs. Kinard and Seaboard, post placed last week.   I will plan to see her back for follow-up in 6 months.  22 minutes of total  time was spent for this patient encounter, including preparation, face-to-face counseling with the patient and coordination of care, and documentation of the encounter.  Comer Dollar, MD  Division of Gynecologic Oncology  Department of Obstetrics and Gynecology  [facility] [place] [facility]

## 2024-08-18 NOTE — Assessment & Plan Note (Addendum)
 She is doing well with regular laxatives

## 2024-08-18 NOTE — Assessment & Plan Note (Addendum)
 The patient was diagnosed with uterine cancer after presentation of postmenopausal bleeding CT imaging showed large uterine mass and severely distended urinary bladder She had hysterectomy performed on July 20, 2024 and did well postoperatively without complications Final pathology: High-grade serous carcinoma without lymph node involvement, stage II disease  I reviewed side effects of treatment with the patient again She is scheduled to start treatment tomorrow I will reduce premed dexamethasone  due to diabetes and prescribed slightly reduced dose of paclitaxel due to pre-existing peripheral neuropathy

## 2024-08-19 ENCOUNTER — Other Ambulatory Visit: Payer: Self-pay

## 2024-08-19 ENCOUNTER — Inpatient Hospital Stay

## 2024-08-19 VITALS — BP 152/67 | HR 89 | Temp 97.9°F | Resp 18

## 2024-08-19 DIAGNOSIS — Z5111 Encounter for antineoplastic chemotherapy: Secondary | ICD-10-CM | POA: Diagnosis not present

## 2024-08-19 DIAGNOSIS — C541 Malignant neoplasm of endometrium: Secondary | ICD-10-CM

## 2024-08-19 MED ORDER — DIPHENHYDRAMINE HCL 50 MG/ML IJ SOLN
12.5000 mg | Freq: Once | INTRAMUSCULAR | Status: AC
  Administered 2024-08-19: 12.5 mg via INTRAVENOUS
  Filled 2024-08-19: qty 1

## 2024-08-19 MED ORDER — PALONOSETRON HCL INJECTION 0.25 MG/5ML
0.2500 mg | Freq: Once | INTRAVENOUS | Status: AC
  Administered 2024-08-19: 0.25 mg via INTRAVENOUS
  Filled 2024-08-19: qty 5

## 2024-08-19 MED ORDER — SODIUM CHLORIDE 0.9 % IV SOLN
410.5000 mg | Freq: Once | INTRAVENOUS | Status: AC
  Administered 2024-08-19: 410 mg via INTRAVENOUS
  Filled 2024-08-19: qty 41

## 2024-08-19 MED ORDER — SODIUM CHLORIDE 0.9 % IV SOLN
150.0000 mg | Freq: Once | INTRAVENOUS | Status: AC
  Administered 2024-08-19: 150 mg via INTRAVENOUS
  Filled 2024-08-19: qty 150

## 2024-08-19 MED ORDER — SODIUM CHLORIDE 0.9 % IV SOLN
135.0000 mg/m2 | Freq: Once | INTRAVENOUS | Status: AC
  Administered 2024-08-19: 252 mg via INTRAVENOUS
  Filled 2024-08-19: qty 42

## 2024-08-19 MED ORDER — DEXAMETHASONE SOD PHOSPHATE PF 10 MG/ML IJ SOLN
5.0000 mg | Freq: Once | INTRAMUSCULAR | Status: AC
  Administered 2024-08-19: 5 mg via INTRAVENOUS

## 2024-08-19 MED ORDER — SODIUM CHLORIDE 0.9 % IV SOLN: INTRAVENOUS | Status: DC

## 2024-08-19 MED ORDER — FAMOTIDINE IN NACL 20-0.9 MG/50ML-% IV SOLN
20.0000 mg | Freq: Once | INTRAVENOUS | Status: AC
  Administered 2024-08-19: 20 mg via INTRAVENOUS
  Filled 2024-08-19: qty 50

## 2024-08-19 NOTE — Patient Instructions (Signed)
 [facility] - A DEPT OF [facility]  Discharge Instructions: Thank you for choosing [facility] to provide your oncology and hematology care.   If you have a lab appointment with the Cancer Center, please go directly to the Cancer Center and check in at the registration area.   Wear comfortable clothing and clothing appropriate for easy access to any Portacath or PICC line.   We strive to give you quality time with your provider. You may need to reschedule your appointment if you arrive late (15 or more minutes).  Arriving late affects you and other patients whose appointments are after yours.  Also, if you miss three or more appointments without notifying the office, you may be dismissed from the clinic at the provider's discretion.      For prescription refill requests, have your pharmacy contact our office and allow 72 hours for refills to be completed.    Today you received the following chemotherapy and/or immunotherapy agents : Paclitaxel (Taxol) and Carboplatin    To help prevent nausea and vomiting after your treatment, we encourage you to take your nausea medication as directed.  BELOW ARE SYMPTOMS THAT SHOULD BE REPORTED IMMEDIATELY: *FEVER GREATER THAN 100.4 F (38 C) OR HIGHER *CHILLS OR SWEATING *NAUSEA AND VOMITING THAT IS NOT CONTROLLED WITH YOUR NAUSEA MEDICATION *UNUSUAL SHORTNESS OF BREATH *UNUSUAL BRUISING OR BLEEDING *URINARY PROBLEMS (pain or burning when urinating, or frequent urination) *BOWEL PROBLEMS (unusual diarrhea, constipation, pain near the anus) TENDERNESS IN MOUTH AND THROAT WITH OR WITHOUT PRESENCE OF ULCERS (sore throat, sores in mouth, or a toothache) UNUSUAL RASH, SWELLING OR PAIN  UNUSUAL VAGINAL DISCHARGE OR ITCHING   Items with * indicate a potential emergency and should be followed up as soon as possible or go to the Emergency Department if any problems should occur.  Please show the CHEMOTHERAPY  ALERT CARD or IMMUNOTHERAPY ALERT CARD at check-in to the Emergency Department and triage nurse.  Should you have questions after your visit or need to cancel or reschedule your appointment, please contact [facility] - A DEPT OF [facility]  Dept: [phone]  and follow the prompts.  Office hours are 8:00 a.m. to 4:30 p.m. Monday - Friday. Please note that voicemails left after 4:00 p.m. may not be returned until the following business day.  We are closed weekends and major holidays. You have access to a nurse at all times for urgent questions. Please call the main number to the clinic Dept: [phone] and follow the prompts.   For any non-urgent questions, you may also contact your provider using MyChart. We now offer e-Visits for anyone 55 and older to request care online for non-urgent symptoms. For details visit [email or web address].   Also download the MyChart app! Go to the app store, search "MyChart", open the app, select [place], and log in with your MyChart username and password.  Paclitaxel Injection What is this medication? PACLITAXEL (PAK li TAX el) treats some types of cancer. It works by slowing down the growth of cancer cells. This medicine may be used for other purposes; ask your health care provider or pharmacist if you have questions. COMMON BRAND NAME(S): Onxol, Taxol What should I tell my care team before I take this medication? They need to know if you have any of these conditions: Heart disease Liver disease Low white blood cell levels An unusual or allergic reaction to paclitaxel, other  medications, foods, dyes, or preservatives If you or your partner are pregnant or trying to get pregnant Breast-feeding How should I use this medication? This medication is injected into a vein. It is given by your care team in a hospital or clinic setting. Talk to your care team about the use of this medication in children. While it may be  given to children for selected conditions, precautions do apply. Overdosage: If you think you have taken too much of this medicine contact a poison control center or emergency room at once. NOTE: This medicine is only for you. Do not share this medicine with others. What if I miss a dose? Keep appointments for follow-up doses. It is important not to miss your dose. Call your care team if you are unable to keep an appointment. What may interact with this medication? Do not take this medication with any of the following: Live virus vaccines Other medications may affect the way this medication works. Talk with your care team about all of the medications you take. They may suggest changes to your treatment plan to lower the risk of side effects and to make sure your medications work as intended. This list may not describe all possible interactions. Give your health care provider a list of all the medicines, herbs, non-prescription drugs, or dietary supplements you use. Also tell them if you smoke, drink alcohol, or use illegal drugs. Some items may interact with your medicine. What should I watch for while using this medication? Your condition will be monitored carefully while you are receiving this medication. You may need blood work while taking this medication. This medication may make you feel generally unwell. This is not uncommon as chemotherapy can affect healthy cells as well as cancer cells. Report any side effects. Continue your course of treatment even though you feel ill unless your care team tells you to stop. This medication can cause serious allergic reactions. To reduce the risk, your care team may give you other medications to take before receiving this one. Be sure to follow the directions from your care team. This medication may increase your risk of getting an infection. Call your care team for advice if you get a fever, chills, sore throat, or other symptoms of a cold or flu. Do not  treat yourself. Try to avoid being around people who are sick. This medication may increase your risk to bruise or bleed. Call your care team if you notice any unusual bleeding. Be careful brushing or flossing your teeth or using a toothpick because you may get an infection or bleed more easily. If you have any dental work done, tell your dentist you are receiving this medication. Talk to your care team if you may be pregnant. Serious birth defects can occur if you take this medication during pregnancy. Talk to your care team before breastfeeding. Changes to your treatment plan may be needed. What side effects may I notice from receiving this medication? Side effects that you should report to your care team as soon as possible: Allergic reactions--skin rash, itching, hives, swelling of the face, lips, tongue, or throat Heart rhythm changes--fast or irregular heartbeat, dizziness, feeling faint or lightheaded, chest pain, trouble breathing Increase in blood pressure Infection--fever, chills, cough, sore throat, wounds that don't heal, pain or trouble when passing urine, general feeling of discomfort or being unwell Low blood pressure--dizziness, feeling faint or lightheaded, blurry vision Low red blood cell level--unusual weakness or fatigue, dizziness, headache, trouble breathing Painful swelling, warmth, or  redness of the skin, blisters or sores at the infusion site Pain, tingling, or numbness in the hands or feet Slow heartbeat--dizziness, feeling faint or lightheaded, confusion, trouble breathing, unusual weakness or fatigue Unusual bruising or bleeding Side effects that usually do not require medical attention (report to your care team if they continue or are bothersome): Diarrhea Hair loss Joint pain Loss of appetite Muscle pain Nausea Vomiting This list may not describe all possible side effects. Call your doctor for medical advice about side effects. You may report side effects to FDA  at 1-800-FDA-1088. Where should I keep my medication? This medication is given in a hospital or clinic. It will not be stored at home. NOTE: This sheet is a summary. It may not cover all possible information. If you have questions about this medicine, talk to your doctor, pharmacist, or health care provider.  2024 Elsevier/Gold Standard (2022-02-25 00:00:00)  Carboplatin Injection What is this medication? CARBOPLATIN (KAR boe pla tin) treats some types of cancer. It works by slowing down the growth of cancer cells. This medicine may be used for other purposes; ask your health care provider or pharmacist if you have questions. COMMON BRAND NAME(S): Paraplatin What should I tell my care team before I take this medication? They need to know if you have any of these conditions: Blood disorders Hearing problems Kidney disease Recent or ongoing radiation therapy An unusual or allergic reaction to carboplatin, cisplatin, other medications, foods, dyes, or preservatives Pregnant or trying to get pregnant Breast-feeding How should I use this medication? This medication is injected into a vein. It is given by your care team in a hospital or clinic setting. Talk to your care team about the use of this medication in children. Special care may be needed. Overdosage: If you think you have taken too much of this medicine contact a poison control center or emergency room at once. NOTE: This medicine is only for you. Do not share this medicine with others. What if I miss a dose? Keep appointments for follow-up doses. It is important not to miss your dose. Call your care team if you are unable to keep an appointment. What may interact with this medication? Medications for seizures Some antibiotics, such as amikacin, gentamicin, neomycin, streptomycin, tobramycin Vaccines This list may not describe all possible interactions. Give your health care provider a list of all the medicines, herbs,  non-prescription drugs, or dietary supplements you use. Also tell them if you smoke, drink alcohol, or use illegal drugs. Some items may interact with your medicine. What should I watch for while using this medication? Your condition will be monitored carefully while you are receiving this medication. You may need blood work while taking this medication. This medication may make you feel generally unwell. This is not uncommon, as chemotherapy can affect healthy cells as well as cancer cells. Report any side effects. Continue your course of treatment even though you feel ill unless your care team tells you to stop. In some cases, you may be given additional medications to help with side effects. Follow all directions for their use. This medication may increase your risk of getting an infection. Call your care team for advice if you get a fever, chills, sore throat, or other symptoms of a cold or flu. Do not treat yourself. Try to avoid being around people who are sick. Avoid taking medications that contain aspirin, acetaminophen, ibuprofen, naproxen, or ketoprofen unless instructed by your care team. These medications may hide a fever. Be  careful brushing or flossing your teeth or using a toothpick because you may get an infection or bleed more easily. If you have any dental work done, tell your dentist you are receiving this medication. Talk to your care team if you wish to become pregnant or think you might be pregnant. This medication can cause serious birth defects. Talk to your care team about effective forms of contraception. Do not breast-feed while taking this medication. What side effects may I notice from receiving this medication? Side effects that you should report to your care team as soon as possible: Allergic reactions--skin rash, itching, hives, swelling of the face, lips, tongue, or throat Infection--fever, chills, cough, sore throat, wounds that don't heal, pain or trouble when passing  urine, general feeling of discomfort or being unwell Low red blood cell level--unusual weakness or fatigue, dizziness, headache, trouble breathing Pain, tingling, or numbness in the hands or feet, muscle weakness, change in vision, confusion or trouble speaking, loss of balance or coordination, trouble walking, seizures Unusual bruising or bleeding Side effects that usually do not require medical attention (report to your care team if they continue or are bothersome): Hair loss Nausea Unusual weakness or fatigue Vomiting This list may not describe all possible side effects. Call your doctor for medical advice about side effects. You may report side effects to FDA at 1-800-FDA-1088. Where should I keep my medication? This medication is given in a hospital or clinic. It will not be stored at home. NOTE: This sheet is a summary. It may not cover all possible information. If you have questions about this medicine, talk to your doctor, pharmacist, or health care provider.  2024 Elsevier/Gold Standard (2022-01-28 00:00:00)

## 2024-08-22 ENCOUNTER — Telehealth: Payer: Self-pay

## 2024-08-22 NOTE — Telephone Encounter (Signed)
-----   Message from Nurse Dawna HERO sent at 08/19/2024  4:39 PM EDT ----- Regarding: Dr. Georgeana 1st tx f/u call Dr. Lonn 1st tx f/u call - 3 hour taxol/carboplatin - tolerated well

## 2024-08-30 ENCOUNTER — Other Ambulatory Visit

## 2024-08-30 ENCOUNTER — Encounter: Payer: Self-pay | Admitting: Internal Medicine

## 2024-08-30 ENCOUNTER — Ambulatory Visit: Admitting: Internal Medicine

## 2024-08-30 VITALS — BP 148/70 | HR 110 | Resp 20 | Ht 63.0 in | Wt 171.8 lb

## 2024-08-30 DIAGNOSIS — N1831 Chronic kidney disease, stage 3a: Secondary | ICD-10-CM | POA: Diagnosis not present

## 2024-08-30 DIAGNOSIS — E0590 Thyrotoxicosis, unspecified without thyrotoxic crisis or storm: Secondary | ICD-10-CM

## 2024-08-30 DIAGNOSIS — E042 Nontoxic multinodular goiter: Secondary | ICD-10-CM

## 2024-08-30 DIAGNOSIS — E1122 Type 2 diabetes mellitus with diabetic chronic kidney disease: Secondary | ICD-10-CM

## 2024-08-30 DIAGNOSIS — Z794 Long term (current) use of insulin: Secondary | ICD-10-CM

## 2024-08-30 DIAGNOSIS — Z7985 Long-term (current) use of injectable non-insulin antidiabetic drugs: Secondary | ICD-10-CM

## 2024-08-30 MED ORDER — LYUMJEV KWIKPEN 100 UNIT/ML ~~LOC~~ SOPN
4.0000 [IU] | PEN_INJECTOR | Freq: Three times a day (TID) | SUBCUTANEOUS | 2 refills | Status: AC
Start: 2024-08-30 — End: ?

## 2024-08-30 NOTE — Progress Notes (Signed)
 Patient ID: Bridget Butler, female   DOB: [birth year], 78 y.o.   MRN: [id number]   HPI  Bridget Butler is a 78 y.o.-year-old female, initially referred by her PCP, Dr. Loreli, returning for follow-up for toxic thyroid  adenomas with subclinical thyrotoxicosis and now also uncontrolled DM2, insulin -dependent, with complications (CKD, DR, PN).  Last visit 4 months ago.  Interim history: No increased urination and thirst, chest pain.  Since last visit, she was diagnosed with endometrial cancer and had TAH and now undergoing chemotherapy - had 1 session, has to have 5 more. On Dexamethasone  and Zofran  with each treatment.  Toxic thyroid  nodules:  Reviewed history: She was diagnosed with hyperthyroidism approximately 2010. She was seeing Dr. Bonnie Gaskins - he retired. She was started on medication (cannot remember name) >> felt off >> stopped in ~2010. I recommended to add methimazole  2.5 mg in 08/2022 but taking it inconsistently. In 01/2023, I advised her to take 5 mg every other day consistently. She took 2.5 mg daily as she had them already cut, but ran out sometime ago, cannot remember how long... Since then, we restarted methimazole , latest recommendation being to increase the dose to 5 mg daily (04/2023). At the visit with PCP in 07/2023, she reported not taking the methimazole  and she was not aware of her thyroid  diagnosis...  Reviewed her TFTs: Lab Results  Component Value Date   TSH 0.36 01/04/2024   TSH 0.44 12/24/2023   TSH 0.36 (L) 07/28/2023   TSH 0.20 (L) 05/04/2023   TSH 0.31 (L) 02/12/2023   TSH 0.23 (L) 12/30/2022   TSH 0.20 (L) 09/01/2022   TSH 0.36 12/31/2021   TSH 0.38 01/14/2021   TSH 0.57 07/18/2020   FREET4 0.81 01/04/2024   FREET4 1.0 12/24/2023   FREET4 0.72 05/04/2023   FREET4 0.72 02/12/2023   FREET4 0.90 09/01/2022   FREET4 0.90 01/14/2021   FREET4 0.66 07/18/2020   FREET4 0.93 06/08/2020   FREET4 0.84 04/11/2019   FREET4 0.97 10/29/2018   T3FREE 3.4  01/04/2024   T3FREE 3.4 12/24/2023   T3FREE 3.5 05/04/2023   T3FREE 3.0 02/12/2023   T3FREE 3.3 09/01/2022   T3FREE 3.0 01/14/2021   T3FREE 3.1 07/18/2020   T3FREE 3.0 04/11/2019   T3FREE 3.8 10/29/2018   T3FREE 3.3 07/08/2018   Her TSI antibodies were not elevated: Lab Results  Component Value Date   TSI <89 10/29/2018    Thyroid  uptake (08/28/2007):  There is heterogeneous uptake within a normal sized gland. There are regions of photopenia within the bilateral upper poles and in the left lower pole. The most intense uptake is in the mid pole of the right lobe of the thyroid  gland. Decreased uptake in the inferior pole of the right lobe of the thyroid  gland additionally.   Calculated 24-hour I-131 uptake is equal to 25.0%, which is normal (normal 10-30%).   IMPRESSION:   1. Heterogeneous uptake within the thyroid  gland could represent resolving subacute thyroiditis in patient with a normal uptake.  2. Cannot exclude a multinodular goiter if TSH continues to remain depressed  We obtained a thyroid  uptake and scan (11/30/2018): Normal uptake but multinodular goiter with warm nodules at inferior thyroid  poles:  4 hour I-123 uptake = 8.6% (normal 5-20%) 24 hour I-123 uptake = 25.3% (normal 10-30%)   IMPRESSION: Normal 4 hour and 24 hour radio iodine uptakes.   Multinodular thyroid  gland with warm nodules at the inferior poles of both thyroid  lobes, corresponding to dominant masses identified on  a remote thyroid  ultrasound consistent with adenomas.  No FH of thyroid  disease or thyroid  cancer. No h/o radiation tx to head or neck. No herbal supplements. No Biotin use.  No steroids.  Multinodular goiter per ultrasound from 06/28/2013.   Biopsies were benign on 07/13/2013.  Pt denies: - feeling nodules in neck - hoarseness - dysphagia - choking  DM2: -Diagnosed: 9092 - insulin  started: ~2006  Reviewed history: Her diabetes became uncontrolled after her first laryngeal surgery  in 03/2020.  She was on steroids at the time of her surgery.  Reviewed HbA1c levels: Lab Results  Component Value Date   HGBA1C 7.1 (H) 07/19/2024   HGBA1C 7.0 (H) 07/08/2024   HGBA1C 7.9 (A) 04/27/2024   HGBA1C 8.7 (A) 12/24/2023   HGBA1C 9.6 (H) 07/28/2023   HGBA1C 8.2 (H) 12/30/2022   HGBA1C 7.7 (A) 09/01/2022   HGBA1C 7.2 (A) 05/01/2022   HGBA1C 7.7 (H) 12/31/2021   HGBA1C 7.5 (A) 09/09/2021   She is on: - Ozempic  0.5 mg weekly >> off >> 0.25 >> 0.5 >> 1 mg weekly - Basaglar  >> Lantus  >> Tresiba 20 units in am She had GI intolerance with regular Metformin .  She was then on metformin  ER 500 mg - but stopped 05/2023 due to abdominal pain and diarrhea. She was also on Prandin  1 to 2 mg before dinner, added 08/2022, but she was not taking this at our visit from 07/2023. Tried Farxiga  and Jardiance  >> $$$. She was previously on Janumet . She was previously on Trulicity  through patient assistance.  She checks her blood sugars with a CGM:  Previously:  Previously: - am:  102-161 >> 140-200 >> 105-193, 203, 245 (steak) - 2h after b'fast: n/c - before lunch: n/c >> 154 >> n/c - 2h after lunch: 154, 159 >> 83-216, 229 >> n/c >> 154 >> n/c - before dinner: n/c >> 211 >> n/c - 2h after dinner: n/c - bedtime:  133-229, 237 >> n/c >> 128-282, 305 >> n/c - nighttime: n/c>> 79, 102 Lowest sugar was 63 >> .[name]..  102 >> 140 >> 79 >> 60; she has hypoglycemia awareness at 70.  Highest sugar was 400s >> .[name].305 >> ... 200s >> 245 >> 282.  Glucometer: True Metrix  Pt's meals are: - Breakfast: eggs, turkey sausage or oatmeal >> coffee  - Lunch: salad + eggs + turkey meat (no pork) >> sandwich - Dinner: veggies + chicken/turkey and fish - Snacks: not after sx  -+ Mild CKD, last BUN/creatinine:  Lab Results  Component Value Date   BUN 24 (H) 08/18/2024   BUN 15 07/19/2024   CREATININE 1.01 (H) 08/18/2024   CREATININE 1.18 (H) 07/19/2024   Lab Results  Component Value Date    MICRALBCREAT 351 (H) 04/27/2024   MICRALBCREAT 7 06/03/2019   MICRALBCREAT 14.1 03/29/2018  On losartan  50.  Kerendia  was suggested by PCP in 07/2023, but she mentions that this was too expensive and did not start.  -+ HL; last set of lipids: Lab Results  Component Value Date   CHOL 207 (H) 07/28/2023   HDL 72.30 07/28/2023   LDLCALC 99 07/28/2023   TRIG 178.0 (H) 07/28/2023   CHOLHDL 3 07/28/2023  On pravastatin  40.  - last eye exam was on 04/05/2024: + DR: Linked Diagnosis  Type 2 diabetes mellitus with stable proliferative diabetic retinopathy, bilateral (HCC)  Sees retina specialist. Dr. [name] Slocumb.  -+ Improved numbness and tingling in her feet.  Last foot exam 04/27/2024.  On ASA 81  Pt has FH of DM in mother.  She also has a history of GERD, osteopenia, microlaryngoscopy for subglottic stenosis 06 and 06/2020.  ROS:  + see HPI  I reviewed pt's medications, allergies, PMH, social hx, family hx, and changes were documented in the history of present illness. Otherwise, unchanged from my initial visit note.  Past Medical History:  Diagnosis Date   Cancer [facility])    Cataract    had surgery   Diabetes mellitus without complication (HCC)    GERD (gastroesophageal reflux disease)    Hyperlipidemia    Hypertension    Multiple thyroid  nodules    Thyroid  disease    Past Surgical History:  Procedure Laterality Date   ABDOMINAL HYSTERECTOMY  07/20/2024   ANTERIOR AND POSTERIOR REPAIR N/A 07/20/2024   Procedure: POSTERIOR REPAIR (RECTOCELE);  Surgeon: Guadlupe Lianne DASEN, MD;  Location: [facility];  Service: Gynecology;  Laterality: N/A;  possible posterior repair   CHOLECYSTECTOMY     COSMETIC SURGERY Bilateral    eyelids   CYSTOSCOPY N/A 07/20/2024   Procedure: CYSTOSCOPY;  Surgeon: Guadlupe Lianne DASEN, MD;  Location: [facility];  Service: Gynecology;  Laterality: N/A;   EYE SURGERY     GALLBLADDER SURGERY     INJECTION, FOR SENTINEL LYMPH NODE IDENTIFICATION N/A 07/20/2024   Procedure:  INJECTION, FOR SENTINEL LYMPH NODE IDENTIFICATION;  Surgeon: Viktoria Comer SAUNDERS, MD;  Location: [facility];  Service: Gynecology;  Laterality: N/A;   IR IMAGING GUIDED PORT INSERTION  08/11/2024   LAPAROSCOPY, OMENTECTOMY N/A 07/20/2024   Procedure: LAPAROSCOPY, OMENTECTOMY;  Surgeon: Viktoria Comer SAUNDERS, MD;  Location: [facility];  Service: Gynecology;  Laterality: N/A;   LYMPH NODE BIOPSY N/A 07/20/2024   Procedure: LYMPH NODE BIOPSY;  Surgeon: Viktoria Comer SAUNDERS, MD;  Location: [facility];  Service: Gynecology;  Laterality: N/A;   MICROLARYNGOSCOPY WITH CO2 LASER AND EXCISION OF VOCAL CORD LESION N/A 04/02/2020   Procedure: MICROLARYNGOSCOPY WITH DILATION AND CO2 LASER AND EXCISION OF VOCAL CORD LESION w/MITOMYCIN  C;  Surgeon: Carlie Clark, MD;  Location[facility];  Service: ENT;  Laterality: N/A;   MICROLARYNGOSCOPY WITH DILATION N/A 05/23/2020   Procedure: MICROLARYNGOSCOPY W/ DILATION AND JET VENTILATION;  Surgeon: Carlie Clark, MD;  Location: [facility] OR;  Service: ENT;  Laterality: N/A;   MICROLARYNGOSCOPY WITH DILATION N/A 07/13/2020   Procedure: MICROLARYNGOSCOPY with Jet Ventilation;  Surgeon: Carlie Clark, MD;  Location: [facility] OR;  Service: ENT;  Laterality: N/A;   ROBOTIC ASSISTED TOTAL HYSTERECTOMY WITH BILATERAL SALPINGO OOPHERECTOMY Bilateral 07/20/2024   Procedure: HYSTERECTOMY, TOTAL, ROBOT-ASSISTED, LAPAROSCOPIC, WITH BILATERAL SALPINGO-OOPHORECTOMY;  Surgeon: Viktoria Comer SAUNDERS, MD;  Location: [facility];  Service: Gynecology;  Laterality: Bilateral;   TRACHEOSTOMY TUBE PLACEMENT N/A 04/02/2020   Procedure: Awake TRACHEOSTOMY;  Surgeon: Carlie Clark, MD;  Location: [facility] OR;  Service: ENT;  Laterality: N/A;   TUBAL LIGATION     UTEROSACRAL LIGAMENT SUSPENSION N/A 07/20/2024   Procedure: UTEROSACRAL LIGAMENT SUSPENSION;  Surgeon: Guadlupe Lianne DASEN, MD;  Location: [facility];  Service: Gynecology;  Laterality: N/A;   Social History   Socioeconomic History   Marital status: Widowed    Spouse name: Not on file    Number of children: 3   Years of education: 45   Highest education level: Not on file  Occupational History   Occupation: Retired  Tobacco Use   Smoking status: Never    Passive exposure: Never   Smokeless tobacco: Never  Vaping Use   Vaping status: Never Used  Substance and Sexual Activity  Alcohol use: No   Drug use: No   Sexual activity: Not Currently    Partners: Male    Birth control/protection: None  Other Topics Concern   Not on file  Social History Narrative   Mrs Vanderpool is a 78 year old Jehovah witness patient    She receives support from her daughter primarily who also assists with transportation to medical appointments.    She is presently independent with her ADLs and iADLs      Lives with son and daughter      Social Drivers of Health   Financial Resource Strain: Low Risk  (09/04/2023)   Overall Financial Resource Strain (CARDIA)    Difficulty of Paying Living Expenses: Not hard at all  Food Insecurity: No Food Insecurity (08/02/2024)   Hunger Vital Sign    Worried About Running Out of Food in the Last Year: Never true    Ran Out of Food in the Last Year: Never true  Transportation Needs: No Transportation Needs (08/02/2024)   PRAPARE - Administrator, Civil Service (Medical): No    Lack of Transportation (Non-Medical): No  Physical Activity: Insufficiently Active (09/04/2023)   Exercise Vital Sign    Days of Exercise per Week: 5 days    Minutes of Exercise per Session: 20 min  Stress: No Stress Concern Present (09/04/2023)   [employer] of Occupational Health - Occupational Stress Questionnaire    Feeling of Stress : Not at all  Social Connections: Moderately Integrated (09/04/2023)   Social Connection and Isolation Panel    Frequency of Communication with Friends and Family: More than three times a week    Frequency of Social Gatherings with Friends and Family: More than three times a week    Attends Religious Services: More than 4  times per year    Active Member of [employer] or Organizations: Yes    Attends Banker Meetings: 1 to 4 times per year    Marital Status: Widowed  Intimate Partner Violence: Not At Risk (08/02/2024)   Humiliation, Afraid, Rape, and Kick questionnaire    Fear of Current or Ex-Partner: No    Emotionally Abused: No    Physically Abused: No    Sexually Abused: No   Current Outpatient Medications on File Prior to Visit  Medication Sig Dispense Refill   amLODipine  (NORVASC ) 5 MG tablet Take 1 tablet by mouth once daily 90 tablet 0   aspirin  81 MG chewable tablet Chew 81 mg by mouth daily.     Blood Glucose Monitoring Suppl (TRUE METRIX METER) DEVI 1 Device by Does not apply route 2 (two) times daily. 1 each 2   calcium  carbonate (OSCAL) 1500 (600 Ca) MG TABS tablet Take 600 mg of elemental calcium  by mouth daily with breakfast.     Cholecalciferol  (VITAMIN D3) 125 MCG (5000 UT) CAPS Take 5,000 Units by mouth daily.     CINNAMON PO Take 2,000 mg by mouth daily.     Continuous Glucose Sensor (FREESTYLE LIBRE 3 PLUS SENSOR) MISC 1 each by Does not apply route every 14 (fourteen) days. 6 each 3   dexamethasone  (DECADRON ) 4 MG tablet 2 tab the morning of chemotherapy, every 3 weeks, by mouth x 6 cycles 12 tablet 6   insulin  degludec (TRESIBA) 200 UNIT/ML FlexTouch Pen Inject 20 Units into the skin daily.     Insulin  Pen Needle 32G X 6 MM MISC 1 Act by Does not apply route once a week. 100 each  1   lidocaine -prilocaine (EMLA) cream Apply to affected area once 30 g 3   Multiple Vitamin (MULTIVITAMIN WITH MINERALS) TABS tablet Take 1 tablet by mouth daily.     olmesartan  (BENICAR ) 20 MG tablet Take 1 tablet (20 mg total) by mouth daily. 90 tablet 0   omeprazole  (PRILOSEC) 40 MG capsule TAKE 1 CAPSULE BY MOUTH TWICE DAILY IN THE MORNING AND AT BEDTIME 180 capsule 0   ondansetron  (ZOFRAN ) 8 MG tablet Take 1 tablet (8 mg total) by mouth every 8 (eight) hours as needed for nausea or vomiting. Start  on the third day after chemotherapy. 30 tablet 1   polyethylene glycol (MIRALAX ) 17 g packet Take 17 g by mouth daily.     potassium chloride  (KLOR-CON  10) 10 MEQ tablet Take 1 tablet (10 mEq total) by mouth 2 (two) times daily. 180 tablet 1   pravastatin  (PRAVACHOL ) 40 MG tablet Take 1 tablet (40 mg total) by mouth at bedtime. 31 tablet 0   prochlorperazine (COMPAZINE) 10 MG tablet Take 1 tablet (10 mg total) by mouth every 6 (six) hours as needed for nausea or vomiting. 30 tablet 1   Semaglutide ,0.25 or 0.5MG /DOS, (OZEMPIC , 0.25 OR 0.5 MG/DOSE,) 2 MG/3ML SOPN Inject 0.5mg  into skin weekly (Patient not taking: No sig reported)     senna-docusate (SENOKOT-S) 8.6-50 MG tablet Take 2 tablets by mouth at bedtime.     No current facility-administered medications on file prior to visit.   Allergies  Allergen Reactions   Metformin  And Related Diarrhea   Other     Refuses blood products.   Family History  Problem Relation Age of Onset   Heart disease Mother    Hypertension Mother    Cancer Sister    Uterine cancer Sister    Diabetes Sister    Diabetes Sister    Diabetes Sister    Diabetes Sister    Diabetes Sister    Diabetes Brother    Cancer Maternal Aunt    Breast cancer Maternal Aunt    Breast cancer Maternal Aunt    Breast cancer Cousin    Prostate cancer Neg Hx    Ovarian cancer Neg Hx    Pancreatic cancer Neg Hx    Colon cancer Neg Hx    Renal cancer Neg Hx    PE: BP (!) 148/70   Pulse (!) 110   Resp 20   Ht 5' 3 (1.6 m)   Wt 171 lb 12.8 oz (77.9 kg)   SpO2 99%   BMI 30.43 kg/m   Wt Readings from Last 30 Encounters:  08/30/24 171 lb 12.8 oz (77.9 kg)  08/18/24 171 lb 9.6 oz (77.8 kg)  08/18/24 176 lb 9.6 oz (80.1 kg)  08/11/24 168 lb (76.2 kg)  08/10/24 (P) 170 lb 6 oz (77.3 kg)  08/02/24 173 lb 12.8 oz (78.8 kg)  07/19/24 174 lb (78.9 kg)  07/18/24 175 lb (79.4 kg)  07/15/24 175 lb 9.6 oz (79.7 kg)  07/14/24 179 lb 12.8 oz (81.6 kg)  07/08/24 183 lb 9.6 oz  (83.3 kg)  06/29/24 187 lb (84.8 kg)  06/09/24 183 lb (83 kg)  04/27/24 183 lb 9.6 oz (83.3 kg)  02/23/24 187 lb (84.8 kg)  02/03/24 189 lb (85.7 kg)  01/04/24 190 lb (86.2 kg)  12/24/23 195 lb 6.4 oz (88.6 kg)  09/04/23 187 lb (84.8 kg)  08/20/23 187 lb 6.4 oz (85 kg)  07/28/23 188 lb (85.3 kg)  07/01/23 191 lb (86.6 kg)  05/04/23 190 lb (86.2 kg)  02/18/23 186 lb (84.4 kg)  01/01/23 189 lb (85.7 kg)  12/30/22 186 lb (84.4 kg)  09/01/22 186 lb 9.6 oz (84.6 kg)  05/01/22 191 lb 3.2 oz (86.7 kg)  02/04/22 191 lb (86.6 kg)  01/09/22 188 lb 9.6 oz (85.5 kg)   Constitutional: overweight, in NAD Eyes: EOMI, no exophthalmos ENT: no thyromegaly, no thyroid  masses palpated, no cervical lymphadenopathy Cardiovascular: Tachycardia, RR, No MRG, +B LE edema Respiratory: CTA B Musculoskeletal: no deformities Skin: no rashes Neurological: no tremor with outstretched hands  ASSESSMENT: 1. DM2-insulin  dependent, uncontrolled, with complications - CKD - DR - PN  2. Thyrotoxic adenomas  3. Multiple thyroid  nodules  PLAN:  1. DM2 -Patient with longstanding, uncontrolled, type 2 diabetes, on injectable antidiabetic regimen with weekly GLP-1 receptor agonist and daily long-acting insulin , with improving control.  At last visit, HbA1c was above target, at 7.9%, but she had another HbA1c on 07/19/2024 which returned at 7.1%, improved.  At that time, sugars appear to have gradually improved in the previous 2 months and they were mostly fluctuating within the target range with occasional peaks after her snack in the afternoon and more consistent hyperglycemia after dinner.  She was doing a better job adjusting her meals based on her CGM trends.  Lantus  was expensive so we switched her to Tresiba but did not change the rest of the regimen. CGM interpretation: -At today's visit, we reviewed her CGM downloads: It appears that 63% of values are in target range (goal >70%), while 37% are higher than 180  (goal <25%), and 0% are lower than 70 (goal <4%).  The calculated average blood sugar is 174.  The projected HbA1c for the next 3 months (GMI) is 7.5%. -Reviewing the CGM trends, sugars have increased.  At her last visit.  They are improving overnight but then increasing significantly after each meal, particularly after lunch.  Upon questioning, she started chemotherapy and she is on dexamethasone  with her chemotherapy treatments and we discussed that this will raise her blood sugars.  I did advise her to start using mealtime insulin  before meals during her chemotherapy sessions.  She has 5 more sessions left.  She may also need the mealtime insulin  before larger meals, since her sugars increase significantly after dietary indiscretions with lunch particularly even outside of chemotherapy sessions.  Will continue the rest of the regimen.  She was increasing the dose of Tresiba due to the high blood sugars lately and I advised her to try not to do so due to the risk of hypoglycemia overnight. - I advised the pt to: Patient Instructions  Please continue: - Ozempic  1 mg weekly - Tresiba 20 units daily  Please use the following insulin  when you get the Chemotherapy infusions: - Lyumjev 4-6 units right before your main meals (may need to increase the dose further if the sugars remain high)   You can continue to stay off methimazole .    Please return in 2 months.  - advised to check sugars at different times of the day - 4x a day, rotating check times - advised for yearly eye exams >> she is UTD - her ACR was elevated at last check, at 351, most likely in the setting of her uterine cancer.  Kerendia  and SGLT2 inhibitors were not affordable.  We will recheck this today. - return to clinic in 2 months  2.  Patient with a history of low TSH without thyrotoxic signs or symptoms: She denies  weight loss, heat intolerance, hyperdefecation, palpitations, anxiety - Her thyroid  uptake and scan showed toxic  adenomas in the inferior thyroid  lobes - Had a mildly suppressed TSH with normal free thyroid  hormones.  She previously had a very low TSH at 0.03 and we discussed about RAI treatment, however, TFTs improved afterwards.  In 2023, TSH was still suppressed, I suggested to add a low-dose methimazole , 2.5 mg daily.  She was taking the medication inconsistently in the past and she did not Being off but TFTs were normal afterwards and we did not have to restart this - TSH was normal at last check: Lab Results  Component Value Date   TSH 0.36 01/04/2024  - Will continue to keep an eye on her TFTs-will check now - No thyrotoxic signs or symptoms but she is usually tachycardic at the time of the appointment  3. Multiple thyroid  nodules - No neck compression symptoms and has no masses on palpation of her neck today - 2 of the nodules were biopsied in 2014 with benign results - She had a thyroid  uptake and scan in 2020 which showed 4 nodules in the lower thyroid  pole, but without indication for biopsy - We are following her conservatively  Orders Placed This Encounter  Procedures   Microalbumin / creatinine urine ratio   TSH   T4, free   T3, free   Lela Fendt, MD PhD [facility] Endocr[facility]

## 2024-08-30 NOTE — Patient Instructions (Addendum)
 Please continue: - Ozempic  1 mg weekly - Tresiba 20 units daily  Please use the following insulin  when you get the Chemotherapy infusions: - Lyumjev 4-6 units right before your main meals (may need to increase the dose further if the sugars remain high)   You can continue to stay off methimazole .    Please return in 2 months.

## 2024-08-31 ENCOUNTER — Ambulatory Visit: Payer: Self-pay | Admitting: Internal Medicine

## 2024-08-31 LAB — T3, FREE: T3, Free: 3.6 pg/mL (ref 2.3–4.2)

## 2024-08-31 LAB — T4, FREE: Free T4: 1.2 ng/dL (ref 0.8–1.8)

## 2024-08-31 LAB — TSH: TSH: 0.17 m[IU]/L — ABNORMAL LOW (ref 0.40–4.50)

## 2024-08-31 LAB — MICROALBUMIN / CREATININE URINE RATIO
Creatinine, Urine: 51 mg/dL (ref 20–275)
Microalb Creat Ratio: 82 mg/g{creat} — ABNORMAL HIGH (ref <30)
Microalb, Ur: 4.2 mg/dL

## 2024-08-31 MED ORDER — METHIMAZOLE 5 MG PO TABS: 5.0000 mg | ORAL_TABLET | ORAL | 3 refills | Status: DC

## 2024-08-31 NOTE — Addendum Note (Signed)
 Addended by: TRIXIE FILE on: 08/31/2024 09:46 AM   Modules accepted: Orders

## 2024-09-01 ENCOUNTER — Encounter: Payer: Self-pay | Admitting: Family Medicine

## 2024-09-01 ENCOUNTER — Encounter: Payer: Self-pay | Admitting: Obstetrics

## 2024-09-01 ENCOUNTER — Ambulatory Visit: Admitting: Obstetrics

## 2024-09-01 VITALS — BP 126/69 | HR 65

## 2024-09-01 DIAGNOSIS — R339 Retention of urine, unspecified: Secondary | ICD-10-CM

## 2024-09-01 DIAGNOSIS — Z48816 Encounter for surgical aftercare following surgery on the genitourinary system: Secondary | ICD-10-CM

## 2024-09-01 NOTE — Assessment & Plan Note (Addendum)
-   urinary retention incontinence, discussed possible stress urinary incontinence. However did not recommend anti-incontinence procedure until after resolution of urinary retention - not outlet obstruction from prolapse  - failed repeated void trial today, advised to start diary to record diary to avoid detrusor overdistention with catheter plug use. Foley catheter replaced, return in 4-6 weeks for repeat void trial. Encouraged to increase frequency of bladder drainage if volume > - marked bladder distention without hydronephrosis on CT in 01/30/23 - reviewed management options for urinary retention includes CIC, indwelling foley, SPT, SNM, or reconstructive surgery. Pt and her daughter declined repeat CIC teaching due to prior difficulty - through joint decision making, pt desires to continue foley catheter management at this time until completion of chemotherapy treatment and pending radiation. Declines additional surgical procedures at this time. Reassess at follow-up.

## 2024-09-01 NOTE — Patient Instructions (Signed)
 Monitor your bladder emptying and record your urine volume when you empty your bladder.   Unplug more frequently if you notice output > .   Please call if you experience any change in urinary or vaginal symptoms.

## 2024-09-01 NOTE — Progress Notes (Signed)
 [facility]  Date of Visit: 09/01/2024  History of Present Illness: Bridget Butler is a 78 y.o. female scheduled today for a post-operative visit.   Surgery: s/p Exam under anesthesia, robotic assisted laparoscopic uterosacral ligament suspension, posterior repair, cystourethroscopy with bladder biopsy  on 07/20/24  Her daughter underwent CIC teaching POD#5.  Foley catheter replaced POD#6.  Postoperative course was uncomplicated.   Today she reports doing well, underwent chemotherapy.  Denies leakage, pain or discomfort from foley  UTI in the last 6 weeks? No  Pain? No  She has returned to her normal activity (except for postop restrictions) Vaginal bulge? No  Bowel issues: No , bowel movements every 2 days  Subjective Success: Do you usually have a bulge or something falling out that you can see or feel in the vaginal area? No  Retreatment Success: Any retreatment with surgery or pessary for any compartment? No   Pathology results:  A. RIGHT OBTURATOR SENTINEL LYMPH NODE BIOPSY: - Lymph node, negative for carcinoma (0/1)  B. LEFT EXTERNAL ILIAC VEIN SENTINEL LYMPH NODE BIOPSY: - Lymph node, negative for carcinoma (0/1)  C. UTERUS, CERVIX, BILATERAL FALLOPIAN TUBES AND OVARIES: - High-grade serous carcinoma, involving anterior and posterior endometrium - Carcinoma invades more than 50% of the myometrium - The cervical surface is involved but definite stromal involvement is not seen - No evidence of lymphovascular invasion - Benign unremarkable bilateral ovaries and fallopian tubes - See oncology table - See comment  D. OMENTUM: - Portion of omentum, negative for carcinoma  E. RIGHT EXTERNAL ILIAC VEIN SENTINEL LYMPH NODE BIOPSY: - Lymph node, negative for carcinoma (0/1)  F. BLADDER BIOPSY: - Bladder mucosa with mild acute inflammation and reactive changes - Negative for dysplasia or carcinoma   Medications: She has a current medication list which includes  the following prescription(s): amlodipine , aspirin , true metrix meter, calcium  carbonate, vitamin d3, cinnamon, freestyle libre 3 plus sensor, dexamethasone , insulin  degludec, lyumjev kwikpen, insulin  pen needle, lidocaine -prilocaine, methimazole , multivitamin with minerals, olmesartan , omeprazole , ondansetron , polyethylene glycol, potassium chloride , pravastatin , prochlorperazine, ozempic  (0.25 or 0.5 mg/dose), and senna-docusate.   Allergies: Patient is allergic to metformin  and related and other.   Physical Exam: BP 126/69   Pulse 65   Abdomen: soft, non-tender, without masses or organomegaly Laparoscopic Incisions: healing well, no significant drainage, no dehiscence.  Pelvic Examination: Vagina: Incisions healing well. Sutures are present at incision line and there is not granulation tissue. No tenderness along the anterior or posterior vagina. No apical tenderness. No pelvic masses.  Foley and stat lock in place with plug.   POP-Q: POP-Q  -3                                            Aa   -3                                           Ba  -7                                              C   2  Gh  4                                            Pb  7                                            tvl   -3                                            Ap  -3                                            Bp                                                 D   Verbal consent was obtained to perform office void trial:   Urethra was visualized with a 45F catheter was placed and bladder was drained completely. The bladder was then backfilled with sterile water by gravity to with minimal sensation of pressure noted.   She was was allowed to void on her own.  A 45Fr foley catheter was replaced with balloon inflated, noted in bag. Stat lock moved to right medial thigh per patient request. Plug placed on foley tubing and secured to stat  lock.  ---------------------------------------------------------  Assessment and Plan:  1. Urinary retention     Urinary retention Assessment & Plan: - urinary retention incontinence, discussed possible stress urinary incontinence. However did not recommend anti-incontinence procedure until after resolution of urinary retention - not outlet obstruction from prolapse  - failed repeated void trial today, advised to start diary to record diary to avoid detrusor overdistention with catheter plug use. Foley catheter replaced, return in 4-6 weeks for repeat void trial. Encouraged to increase frequency of bladder drainage if volume > - marked bladder distention without hydronephrosis on CT in 01/30/23 - reviewed management options for urinary retention includes CIC, indwelling foley, SPT, SNM, or reconstructive surgery. Pt and her daughter declined repeat CIC teaching due to prior difficulty - through joint decision making, pt desires to continue foley catheter management at this time until completion of chemotherapy treatment and pending radiation. Declines additional surgical procedures at this time. Reassess at follow-up.    - Bladder biopsy pathology results were reviewed with the patient today and she verbalized understanding that the results were benign.  - Can resume regular activity including exercise and intercourse,  if desired.  - Discussed avoidance of heavy lifting and straining long term to reduce the risk of recurrence.   All questions answered.   Return in about 6 weeks (around 10/13/2024).

## 2024-09-05 ENCOUNTER — Ambulatory Visit

## 2024-09-05 ENCOUNTER — Encounter: Admitting: Internal Medicine

## 2024-09-07 ENCOUNTER — Ambulatory Visit: Payer: Self-pay | Admitting: Family Medicine

## 2024-09-07 ENCOUNTER — Encounter: Payer: Self-pay | Admitting: Family Medicine

## 2024-09-07 VITALS — BP 140/60 | HR 100 | Temp 98.4°F | Ht 63.0 in | Wt 170.0 lb

## 2024-09-07 DIAGNOSIS — E876 Hypokalemia: Secondary | ICD-10-CM

## 2024-09-07 DIAGNOSIS — E1169 Type 2 diabetes mellitus with other specified complication: Secondary | ICD-10-CM | POA: Diagnosis not present

## 2024-09-07 DIAGNOSIS — E1122 Type 2 diabetes mellitus with diabetic chronic kidney disease: Secondary | ICD-10-CM

## 2024-09-07 DIAGNOSIS — Z7689 Persons encountering health services in other specified circumstances: Secondary | ICD-10-CM

## 2024-09-07 DIAGNOSIS — E785 Hyperlipidemia, unspecified: Secondary | ICD-10-CM

## 2024-09-07 DIAGNOSIS — I152 Hypertension secondary to endocrine disorders: Secondary | ICD-10-CM

## 2024-09-07 DIAGNOSIS — E1159 Type 2 diabetes mellitus with other circulatory complications: Secondary | ICD-10-CM

## 2024-09-07 DIAGNOSIS — C541 Malignant neoplasm of endometrium: Secondary | ICD-10-CM

## 2024-09-07 DIAGNOSIS — Z6830 Body mass index (BMI) 30.0-30.9, adult: Secondary | ICD-10-CM

## 2024-09-07 DIAGNOSIS — E6609 Other obesity due to excess calories: Secondary | ICD-10-CM

## 2024-09-07 DIAGNOSIS — Z794 Long term (current) use of insulin: Secondary | ICD-10-CM

## 2024-09-07 DIAGNOSIS — Z930 Tracheostomy status: Secondary | ICD-10-CM | POA: Insufficient documentation

## 2024-09-07 DIAGNOSIS — R339 Retention of urine, unspecified: Secondary | ICD-10-CM

## 2024-09-07 DIAGNOSIS — K219 Gastro-esophageal reflux disease without esophagitis: Secondary | ICD-10-CM

## 2024-09-07 DIAGNOSIS — N1831 Chronic kidney disease, stage 3a: Secondary | ICD-10-CM

## 2024-09-07 DIAGNOSIS — E039 Hypothyroidism, unspecified: Secondary | ICD-10-CM

## 2024-09-07 NOTE — Progress Notes (Signed)
 I,Jameka J Llittleton, CMA,acting as a neurosurgeon for [employer], NP.,have documented all relevant documentation on the behalf of Bruna Creighton, NP,as directed by  Bruna Creighton, NP while in the presence of Bruna Creighton, NP.  Subjective:  Patient ID: Bridget Butler , female    DOB: 14-Jun-1946 , 78 y.o.   MRN: [id number]  Chief Complaint  Patient presents with   Establish Care    Patient presents today to establish care. Patient last seen her pcp in October. Patient reports she was dissatisfied with her care there.   Hypertension    Patient reports compliance with her meds. Patient denies having any chest pain,sob or headaches at this time. She reports her bp is ranging around 140s due to her having chemo.   Diabetes    Patient reports compliance with her meds. Patient reports her blood sugars have been running pretty good. She does report she does have a freestyle libre.     HPI Discussed the use of AI scribe software for clinical note transcription with the patient, who gave verbal consent to proceed.  History of Present Illness    Bridget Butler is a 78 year old female who presents to establish care. She is accompanied by her daughter, Bridget Butler.  She is transferring care to establish a new primary care provider. She has a history of hypertension, diabetes, urinary retention with an indwelling catheter, and endometrial cancer. She is currently undergoing chemotherapy, having started her treatment on October 31st, with her second session scheduled for this Friday. She anticipates completing chemotherapy around Valentine's Day, followed by radiation therapy two weeks after her second chemotherapy session.  She was diagnosed with endometrial cancer in September following a biopsy that was performed due to complications from a prolapsed uterus. She underwent a full hysterectomy and prolapse repair shortly after diagnosis.  She has a history of hypertension and is currently taking Benicar  and amlodipine  for  blood pressure management. She also takes Tresiba daily (20 units) and Ozempic  weekly for diabetes management. She has a history of hypothyroidism but has not been taking her prescribed medication, Tapazole , due to personal choice. She experiences symptoms of tiredness and cold intolerance.  She has a history of urinary retention and uses an indwelling catheter that is changed every thirty days. She also has a history of a tracheostomy, which was removed in 2021, and she wears hearing aids.  She experiences constipation and manages it with Senokot and Miralax  as needed. She takes pravastatin  for cholesterol management, one tablet daily at bedtime. She also takes medication for acid reflux, which she takes twice daily. She reports irregular bowel movements, managed with Senokot and Miralax .      Past Medical History:  Diagnosis Date   Cancer [facility])    endometrial stage 2   Cataract    had surgery   Diabetes mellitus without complication (HCC)    GERD (gastroesophageal reflux disease)    Hyperlipidemia    Hypertension    Multiple thyroid  nodules    Thyroid  disease      Family History  Problem Relation Age of Onset   Heart disease Mother    Hypertension Mother    Cancer Sister    Uterine cancer Sister    Diabetes Sister    Diabetes Sister    Diabetes Sister    Diabetes Sister    Diabetes Sister    Diabetes Brother    Cancer Maternal Aunt    Breast cancer Maternal Aunt    Breast cancer  Maternal Aunt    Hypertension Maternal Grandmother    Breast cancer Cousin    Prostate cancer Neg Hx    Ovarian cancer Neg Hx    Pancreatic cancer Neg Hx    Colon cancer Neg Hx    Renal cancer Neg Hx      Current Outpatient Medications:    aspirin  81 MG chewable tablet, Chew 81 mg by mouth daily., Disp: , Rfl:    Blood Glucose Monitoring Suppl (TRUE METRIX METER) DEVI, 1 Device by Does not apply route 2 (two) times daily., Disp: 1 each, Rfl: 2   calcium  carbonate (OSCAL) 1500 (600 Ca) MG  TABS tablet, Take 600 mg of elemental calcium  by mouth daily with breakfast., Disp: , Rfl:    Cholecalciferol  (VITAMIN D3) 125 MCG (5000 UT) CAPS, Take 5,000 Units by mouth daily., Disp: , Rfl:    Continuous Glucose Sensor (FREESTYLE LIBRE 3 PLUS SENSOR) MISC, 1 each by Does not apply route every 14 (fourteen) days., Disp: 6 each, Rfl: 3   dexamethasone  (DECADRON ) 4 MG tablet, 2 tab the morning of chemotherapy, every 3 weeks, by mouth x 6 cycles, Disp: 12 tablet, Rfl: 6   insulin  degludec (TRESIBA) 200 UNIT/ML FlexTouch Pen, Inject 20 Units into the skin daily., Disp: , Rfl:    Insulin  Lispro-aabc (LYUMJEV  KWIKPEN) 100 UNIT/ML KwikPen, Inject 4-6 Units into the skin 3 (three) times daily before meals., Disp: 15 mL, Rfl: 2   Insulin  Pen Needle 32G X 6 MM MISC, 1 Act by Does not apply route once a week., Disp: 100 each, Rfl: 1   lidocaine -prilocaine  (EMLA ) cream, Apply to affected area once, Disp: 30 g, Rfl: 3   methimazole  (TAPAZOLE ) 5 MG tablet, Take 1 tablet (5 mg total) by mouth every other day., Disp: 45 tablet, Rfl: 3   Multiple Vitamin (MULTIVITAMIN WITH MINERALS) TABS tablet, Take 1 tablet by mouth daily., Disp: , Rfl:    ondansetron  (ZOFRAN ) 8 MG tablet, Take 1 tablet (8 mg total) by mouth every 8 (eight) hours as needed for nausea or vomiting. Start on the third day after chemotherapy., Disp: 30 tablet, Rfl: 1   polyethylene glycol (MIRALAX ) 17 g packet, Take 17 g by mouth daily., Disp: , Rfl:    prochlorperazine  (COMPAZINE ) 10 MG tablet, Take 1 tablet (10 mg total) by mouth every 6 (six) hours as needed for nausea or vomiting., Disp: 30 tablet, Rfl: 1   Semaglutide ,0.25 or 0.5MG /DOS, (OZEMPIC , 0.25 OR 0.5 MG/DOSE,) 2 MG/3ML SOPN, Inject 0.5mg  into skin weekly, Disp: , Rfl:    senna-docusate (SENOKOT-S) 8.6-50 MG tablet, Take 2 tablets by mouth at bedtime., Disp: , Rfl:    amLODipine  (NORVASC ) 5 MG tablet, Take 1 tablet (5 mg total) by mouth daily., Disp: 90 tablet, Rfl: 1   olmesartan   (BENICAR ) 20 MG tablet, Take 1 tablet (20 mg total) by mouth daily., Disp: 90 tablet, Rfl: 1   omeprazole  (PRILOSEC) 40 MG capsule, TAKE 1 CAPSULE BY MOUTH TWICE DAILY IN THE MORNING AND AT BEDTIME, Disp: 180 capsule, Rfl: 1   potassium chloride  (KLOR-CON  10) 10 MEQ tablet, Take 1 tablet (10 mEq total) by mouth 2 (two) times daily., Disp: 180 tablet, Rfl: 1   pravastatin  (PRAVACHOL ) 40 MG tablet, Take 1 tablet (40 mg total) by mouth at bedtime., Disp: 90 tablet, Rfl: 1   Allergies  Allergen Reactions   Metformin  And Related Diarrhea   Other     Refuses blood products.     Review of Systems  Constitutional: Negative.   Eyes: Negative.   Respiratory: Negative.    Cardiovascular: Negative.   Gastrointestinal:  Positive for abdominal pain.  Musculoskeletal: Negative.   Skin: Negative.   Hematological: Negative.   Psychiatric/Behavioral:  Negative for suicidal ideas.      Today's Vitals   09/07/24 1420  BP: (!) 140/60  Pulse: 100  Temp: 98.4 F (36.9 C)  TempSrc: Oral  Weight: 170 lb (77.1 kg)  Height: 5' 3 (1.6 m)  PainSc: 0-No pain   Body mass index is 30.11 kg/m.  Wt Readings from Last 3 Encounters:  09/07/24 170 lb (77.1 kg)  08/30/24 171 lb 12.8 oz (77.9 kg)  08/18/24 171 lb 9.6 oz (77.8 kg)    The 10-year ASCVD risk score (Arnett DK, et al., 2019) is: 44.1%   Values used to calculate the score:     Age: 82 years     Clincally relevant sex: Female     Is Non-Hispanic African American: Yes     Diabetic: Yes     Tobacco smoker: No     Systolic Blood Pressure: 132 mmHg     Is BP treated: Yes     HDL Cholesterol: 72.3 mg/dL     Total Cholesterol: 207 mg/dL  Objective:  Physical Exam Constitutional:      Appearance: Normal appearance.  Cardiovascular:     Rate and Rhythm: Regular rhythm. Tachycardia present.     Pulses: Normal pulses.     Heart sounds: Normal heart sounds.  Pulmonary:     Effort: Pulmonary effort is normal.     Breath sounds: Normal breath  sounds.  Abdominal:     General: Bowel sounds are normal.  Neurological:     Mental Status: She is alert.         Assessment And Plan:   Assessment & Plan Establishing care with new doctor, encounter for  Hypertension associated with diabetes (HCC) Managed with amlodipine . Recent BP 148/70 mmHg. - Refilled amlodipine  prescription. Type 2 diabetes mellitus with stage 3a chronic kidney disease, with long-term current use of insulin  (HCC) Managed with insulin  and GLP-1 agonist under endocrinologist care. Endometrial cancer (HCC) Post-hysterectomy, on chemotherapy, radiation planned. Cancer contained, no spread. - Continue chemotherapy as scheduled. - Plan for radiation therapy to start two weeks after the second chemotherapy session. Gastroesophageal reflux disease without esophagitis Managed with Omeprazole  twice daily.  Hyperlipidemia associated with type 2 diabetes mellitus (HCC)  Continue pravastatin  once daily at bedtime.  Urinary retention Managed with indwelling catheter under urologist care. - Continue current catheter management with urologist. Acquired hypothyroidism Hypothyroidism, nonadherent to medication Nonadherent to levothyroxine, reports fatigue and cold intolerance. - Encouraged adherence to levothyroxine as prescribed. Class 1 obesity due to excess calories with serious comorbidity and body mass index (BMI) of 30.0 to 30.9 in adult Maintain BMI of 30 or less to avoid cardiovascular risk       Return for 4 blood pressure, AMV in DEC 2025.  Patient was given opportunity to ask questions. Patient verbalized understanding of the plan and was able to repeat key elements of the plan. All questions were answered to their satisfaction.    I, Bruna Creighton, NP, have reviewed all documentation for this visit. The documentation on 09/20/2023 for the exam, diagnosis, procedures, and orders are all accurate and complete.   IF YOU HAVE BEEN REFERRED TO A SPECIALIST, IT  MAY TAKE 1-2 WEEKS TO SCHEDULE/PROCESS THE REFERRAL. IF YOU HAVE NOT HEARD FROM US /SPECIALIST IN TWO WEEKS, PLEASE  GIVE US  A CALL AT [phone] X [phone].

## 2024-09-07 NOTE — Patient Instructions (Signed)

## 2024-09-07 NOTE — Assessment & Plan Note (Signed)
 Resolved-Out since 2021

## 2024-09-08 MED FILL — Fosaprepitant Dimeglumine For IV Infusion 150 MG (Base Eq): INTRAVENOUS | Qty: 5 | Status: AC

## 2024-09-09 ENCOUNTER — Inpatient Hospital Stay

## 2024-09-09 ENCOUNTER — Inpatient Hospital Stay (HOSPITAL_BASED_OUTPATIENT_CLINIC_OR_DEPARTMENT_OTHER): Admitting: Hematology and Oncology

## 2024-09-09 ENCOUNTER — Inpatient Hospital Stay: Attending: Gynecologic Oncology

## 2024-09-09 ENCOUNTER — Encounter: Payer: Self-pay | Admitting: Hematology and Oncology

## 2024-09-09 VITALS — BP 132/63 | HR 97 | Temp 98.2°F

## 2024-09-09 DIAGNOSIS — Z79899 Other long term (current) drug therapy: Secondary | ICD-10-CM | POA: Insufficient documentation

## 2024-09-09 DIAGNOSIS — T451X5A Adverse effect of antineoplastic and immunosuppressive drugs, initial encounter: Secondary | ICD-10-CM | POA: Insufficient documentation

## 2024-09-09 DIAGNOSIS — N950 Postmenopausal bleeding: Secondary | ICD-10-CM | POA: Diagnosis not present

## 2024-09-09 DIAGNOSIS — D6481 Anemia due to antineoplastic chemotherapy: Secondary | ICD-10-CM | POA: Insufficient documentation

## 2024-09-09 DIAGNOSIS — C541 Malignant neoplasm of endometrium: Secondary | ICD-10-CM | POA: Insufficient documentation

## 2024-09-09 DIAGNOSIS — K5900 Constipation, unspecified: Secondary | ICD-10-CM | POA: Diagnosis not present

## 2024-09-09 DIAGNOSIS — Z5111 Encounter for antineoplastic chemotherapy: Secondary | ICD-10-CM | POA: Diagnosis present

## 2024-09-09 DIAGNOSIS — Z90710 Acquired absence of both cervix and uterus: Secondary | ICD-10-CM | POA: Insufficient documentation

## 2024-09-09 DIAGNOSIS — R112 Nausea with vomiting, unspecified: Secondary | ICD-10-CM | POA: Insufficient documentation

## 2024-09-09 LAB — CMP (CANCER CENTER ONLY)
ALT: 22 U/L (ref 0–44)
AST: 24 U/L (ref 15–41)
Albumin: 4 g/dL (ref 3.5–5.0)
Alkaline Phosphatase: 73 U/L (ref 38–126)
Anion gap: 8 (ref 5–15)
BUN: 24 mg/dL — ABNORMAL HIGH (ref 8–23)
CO2: 25 mmol/L (ref 22–32)
Calcium: 9.7 mg/dL (ref 8.9–10.3)
Chloride: 105 mmol/L (ref 98–111)
Creatinine: 0.98 mg/dL (ref 0.44–1.00)
GFR, Estimated: 59 mL/min — ABNORMAL LOW (ref >60)
Glucose, Bld: 178 mg/dL — ABNORMAL HIGH (ref 70–99)
Potassium: 4.4 mmol/L (ref 3.5–5.1)
Sodium: 137 mmol/L (ref 135–145)
Total Bilirubin: 0.2 mg/dL (ref 0.0–1.2)
Total Protein: 7.1 g/dL (ref 6.5–8.1)

## 2024-09-09 LAB — CBC WITH DIFFERENTIAL (CANCER CENTER ONLY)
Abs Immature Granulocytes: 0.04 K/uL (ref 0.00–0.07)
Basophils Absolute: 0 K/uL (ref 0.0–0.1)
Basophils Relative: 0 %
Eosinophils Absolute: 0 K/uL (ref 0.0–0.5)
Eosinophils Relative: 0 %
HCT: 27.4 % — ABNORMAL LOW (ref 36.0–46.0)
Hemoglobin: 9.1 g/dL — ABNORMAL LOW (ref 12.0–15.0)
Immature Granulocytes: 0 %
Lymphocytes Relative: 18 %
Lymphs Abs: 1.7 K/uL (ref 0.7–4.0)
MCH: 31.4 pg (ref 26.0–34.0)
MCHC: 33.2 g/dL (ref 30.0–36.0)
MCV: 94.5 fL (ref 80.0–100.0)
Monocytes Absolute: 0.6 K/uL (ref 0.1–1.0)
Monocytes Relative: 6 %
Neutro Abs: 6.9 K/uL (ref 1.7–7.7)
Neutrophils Relative %: 76 %
Platelet Count: 228 K/uL (ref 150–400)
RBC: 2.9 MIL/uL — ABNORMAL LOW (ref 3.87–5.11)
RDW: 13.7 % (ref 11.5–15.5)
WBC Count: 9.2 K/uL (ref 4.0–10.5)
nRBC: 0 % (ref 0.0–0.2)

## 2024-09-09 MED ORDER — SODIUM CHLORIDE 0.9 % IV SOLN
150.0000 mg | Freq: Once | INTRAVENOUS | Status: AC
  Administered 2024-09-09: 150 mg via INTRAVENOUS
  Filled 2024-09-09: qty 150

## 2024-09-09 MED ORDER — SODIUM CHLORIDE 0.9 % IV SOLN
135.0000 mg/m2 | Freq: Once | INTRAVENOUS | Status: AC
  Administered 2024-09-09: 252 mg via INTRAVENOUS
  Filled 2024-09-09: qty 42

## 2024-09-09 MED ORDER — DEXAMETHASONE SOD PHOSPHATE PF 10 MG/ML IJ SOLN
5.0000 mg | Freq: Once | INTRAMUSCULAR | Status: AC
  Administered 2024-09-09: 5 mg via INTRAVENOUS

## 2024-09-09 MED ORDER — SODIUM CHLORIDE 0.9 % IV SOLN: INTRAVENOUS | Status: DC

## 2024-09-09 MED ORDER — FAMOTIDINE IN NACL 20-0.9 MG/50ML-% IV SOLN
20.0000 mg | Freq: Once | INTRAVENOUS | Status: AC
  Administered 2024-09-09: 20 mg via INTRAVENOUS
  Filled 2024-09-09: qty 50

## 2024-09-09 MED ORDER — PALONOSETRON HCL INJECTION 0.25 MG/5ML
0.2500 mg | Freq: Once | INTRAVENOUS | Status: AC
  Administered 2024-09-09: 0.25 mg via INTRAVENOUS
  Filled 2024-09-09: qty 5

## 2024-09-09 MED ORDER — DIPHENHYDRAMINE HCL 50 MG/ML IJ SOLN
12.5000 mg | Freq: Once | INTRAMUSCULAR | Status: AC
  Administered 2024-09-09: 12.5 mg via INTRAVENOUS
  Filled 2024-09-09: qty 1

## 2024-09-09 MED ORDER — SODIUM CHLORIDE 0.9 % IV SOLN
413.5000 mg | Freq: Once | INTRAVENOUS | Status: AC
  Administered 2024-09-09: 410 mg via INTRAVENOUS
  Filled 2024-09-09: qty 41

## 2024-09-09 NOTE — Assessment & Plan Note (Addendum)
 This is likely due to recent treatment. The patient denies recent history of bleeding such as epistaxis, hematuria or hematochezia. She is asymptomatic from the anemia. I will observe for now.  She does not require transfusion now. I will continue the chemotherapy at current dose without dosage adjustment.  If the anemia gets progressive worse in the future, I might have to delay her treatment or adjust the chemotherapy dose.

## 2024-09-09 NOTE — Progress Notes (Signed)
 [facility] OFFICE PROGRESS NOTE  Patient Care Team: Petrina Pries, NP as PCP - General (Family Medicine) Jarold Mayo, MD as Consulting Physician (Ophthalmology) Trixie File, MD as Consulting Physician (Internal Medicine) Dallie Vera GAILS, MD as Consulting Physician (Obstetrics and Gynecology)  Assessment & Plan Endometrial cancer [facility]) The patient was diagnosed with uterine cancer after presentation of postmenopausal bleeding CT imaging showed large uterine mass and severely distended urinary bladder She had hysterectomy performed on July 20, 2024 and did well postoperatively without complications Final pathology: High-grade serous carcinoma without lymph node involvement, stage II disease  She tolerated cycle 1 of treatment well except for hyperglycemia, progressive anemia and 1 episode of nausea and vomiting We will proceed with cycle 2 without delay Anemia due to antineoplastic chemotherapy This is likely due to recent treatment. The patient denies recent history of bleeding such as epistaxis, hematuria or hematochezia. She is asymptomatic from the anemia. I will observe for now.  She does not require transfusion now. I will continue the chemotherapy at current dose without dosage adjustment.  If the anemia gets progressive worse in the future, I might have to delay her treatment or adjust the chemotherapy dose.  Nausea and vomiting, unspecified vomiting type She had nausea and vomiting the day after treatment This is a little unusual I recommend the patient to consider chewing Tums in case this is caused by reflux She has antiemetics to take as needed  No orders of the defined types were placed in this encounter.    [name], MD  INTERVAL HISTORY: she returns for treatment follow-up Complications related to previous cycle of chemotherapy included anemia,, nausea with/without vomiting,, and elevated blood sugar Her episode of nausea and vomiting  happened the day after treatment She also have chronic intermittent constipation No worsening peripheral neuropathy  PHYSICAL EXAMINATION: ECOG PERFORMANCE STATUS: 1 - Symptomatic but completely ambulatory  Lab Results  Component Value Date   CAN125 81.6 (H) 07/08/2024      Latest Ref Rng & Units 09/09/2024    9:07 AM 08/18/2024   12:32 PM 07/19/2024    1:33 PM  CBC  WBC 4.0 - 10.5 K/uL 9.2  6.5  7.9   Hemoglobin 12.0 - 15.0 g/dL 9.1  89.9  89.8   Hematocrit 36.0 - 46.0 % 27.4  29.9  32.7   Platelets 150 - 400 K/uL 228  284  334       Chemistry      Component Value Date/Time   NA 137 09/09/2024 0907   NA 137 06/04/2020 1535   K 4.4 09/09/2024 0907   CL 105 09/09/2024 0907   CO2 25 09/09/2024 0907   BUN 24 (H) 09/09/2024 0907   BUN 13 06/04/2020 1535   CREATININE 0.98 09/09/2024 0907      Component Value Date/Time   CALCIUM  9.7 09/09/2024 0907   ALKPHOS 73 09/09/2024 0907   AST 24 09/09/2024 0907   ALT 22 09/09/2024 0907   BILITOT 0.2 09/09/2024 0907       There were no vitals filed for this visit. There were no vitals filed for this visit. Other relevant data reviewed during this visit included CBC and CMP

## 2024-09-09 NOTE — Assessment & Plan Note (Addendum)
 The patient was diagnosed with uterine cancer after presentation of postmenopausal bleeding CT imaging showed large uterine mass and severely distended urinary bladder She had hysterectomy performed on July 20, 2024 and did well postoperatively without complications Final pathology: High-grade serous carcinoma without lymph node involvement, stage II disease  She tolerated cycle 1 of treatment well except for hyperglycemia, progressive anemia and 1 episode of nausea and vomiting We will proceed with cycle 2 without delay

## 2024-09-09 NOTE — Patient Instructions (Signed)
 [facility] - A DEPT OF [facility]  Discharge Instructions: Thank you for choosing [facility] to provide your oncology and hematology care.   If you have a lab appointment with the Cancer Center, please go directly to the Cancer Center and check in at the registration area.   Wear comfortable clothing and clothing appropriate for easy access to any Portacath or PICC line.   We strive to give you quality time with your provider. You may need to reschedule your appointment if you arrive late (15 or more minutes).  Arriving late affects you and other patients whose appointments are after yours.  Also, if you miss three or more appointments without notifying the office, you may be dismissed from the clinic at the provider's discretion.      For prescription refill requests, have your pharmacy contact our office and allow 72 hours for refills to be completed.    Today you received the following chemotherapy and/or immunotherapy agents: Paclitaxel  (Taxol ) & Carboplatin  (Paraplatin )      To help prevent nausea and vomiting after your treatment, we encourage you to take your nausea medication as directed.  BELOW ARE SYMPTOMS THAT SHOULD BE REPORTED IMMEDIATELY: *FEVER GREATER THAN 100.4 F (38 C) OR HIGHER *CHILLS OR SWEATING *NAUSEA AND VOMITING THAT IS NOT CONTROLLED WITH YOUR NAUSEA MEDICATION *UNUSUAL SHORTNESS OF BREATH *UNUSUAL BRUISING OR BLEEDING *URINARY PROBLEMS (pain or burning when urinating, or frequent urination) *BOWEL PROBLEMS (unusual diarrhea, constipation, pain near the anus) TENDERNESS IN MOUTH AND THROAT WITH OR WITHOUT PRESENCE OF ULCERS (sore throat, sores in mouth, or a toothache) UNUSUAL RASH, SWELLING OR PAIN  UNUSUAL VAGINAL DISCHARGE OR ITCHING   Items with * indicate a potential emergency and should be followed up as soon as possible or go to the Emergency Department if any problems should occur.  Please show the  CHEMOTHERAPY ALERT CARD or IMMUNOTHERAPY ALERT CARD at check-in to the Emergency Department and triage nurse.  Should you have questions after your visit or need to cancel or reschedule your appointment, please contact [facility] - A DEPT OF [facility]  Dept: [phone]  and follow the prompts.  Office hours are 8:00 a.m. to 4:30 p.m. Monday - Friday. Please note that voicemails left after 4:00 p.m. may not be returned until the following business day.  We are closed weekends and major holidays. You have access to a nurse at all times for urgent questions. Please call the main number to the clinic Dept: [phone] and follow the prompts.   For any non-urgent questions, you may also contact your provider using MyChart. We now offer e-Visits for anyone 41 and older to request care online for non-urgent symptoms. For details visit [email or web address].   Also download the MyChart app! Go to the app store, search MyChart, open the app, select [facility], and log in with your MyChart username and password.

## 2024-09-09 NOTE — Patient Instructions (Signed)

## 2024-09-09 NOTE — Assessment & Plan Note (Addendum)
 She had nausea and vomiting the day after treatment This is a little unusual I recommend the patient to consider chewing Tums in case this is caused by reflux She has antiemetics to take as needed

## 2024-09-19 DIAGNOSIS — E1169 Type 2 diabetes mellitus with other specified complication: Secondary | ICD-10-CM | POA: Insufficient documentation

## 2024-09-19 DIAGNOSIS — E6609 Other obesity due to excess calories: Secondary | ICD-10-CM | POA: Insufficient documentation

## 2024-09-19 DIAGNOSIS — Z7689 Persons encountering health services in other specified circumstances: Secondary | ICD-10-CM | POA: Insufficient documentation

## 2024-09-19 DIAGNOSIS — Z794 Long term (current) use of insulin: Secondary | ICD-10-CM | POA: Insufficient documentation

## 2024-09-19 MED ORDER — OMEPRAZOLE 40 MG PO CPDR: DELAYED_RELEASE_CAPSULE | ORAL | 1 refills | Status: AC

## 2024-09-19 MED ORDER — OLMESARTAN MEDOXOMIL 20 MG PO TABS: 20.0000 mg | ORAL_TABLET | Freq: Every day | ORAL | 1 refills | Status: AC

## 2024-09-19 MED ORDER — AMLODIPINE BESYLATE 5 MG PO TABS: 5.0000 mg | ORAL_TABLET | Freq: Every day | ORAL | 1 refills | Status: AC

## 2024-09-19 MED ORDER — POTASSIUM CHLORIDE ER 10 MEQ PO TBCR: 10.0000 meq | EXTENDED_RELEASE_TABLET | Freq: Two times a day (BID) | ORAL | 1 refills | Status: AC

## 2024-09-19 MED ORDER — PRAVASTATIN SODIUM 40 MG PO TABS: 40.0000 mg | ORAL_TABLET | Freq: Every day | ORAL | 1 refills | Status: AC

## 2024-09-19 NOTE — Assessment & Plan Note (Signed)
 Maintain BMI of 30 or less to avoid cardiovascular risk

## 2024-09-19 NOTE — Assessment & Plan Note (Signed)
 Managed with indwelling catheter under urologist care. - Continue current catheter management with urologist.

## 2024-09-19 NOTE — Assessment & Plan Note (Signed)
 Post-hysterectomy, on chemotherapy, radiation planned. Cancer contained, no spread. - Continue chemotherapy as scheduled. - Plan for radiation therapy to start two weeks after the second chemotherapy session.

## 2024-09-19 NOTE — Assessment & Plan Note (Signed)
 Managed with Omeprazole  twice daily.

## 2024-09-19 NOTE — Assessment & Plan Note (Signed)
 Continue pravastatin  once daily at bedtime.

## 2024-09-19 NOTE — Assessment & Plan Note (Signed)
 Managed with amlodipine . Recent BP 148/70 mmHg. - Refilled amlodipine  prescription.

## 2024-09-19 NOTE — Assessment & Plan Note (Signed)
 Managed with insulin  and GLP-1 agonist under endocrinologist care.

## 2024-09-21 NOTE — Progress Notes (Signed)
 Bridget Butler is here today for follow up new vaginal cylinder fitting.     Does the patient complain of any of the following:  Pain: None Abdominal bloating: No Diarrhea/Constipation: No Nausea/Vomiting: No Vaginal Discharge: No Blood in Urine or Stool: No Urinary Issues (dysuria/incomplete emptying/ incontinence/ increased frequency/urgency): No, permanent catheter, changed every month.    Additional comments if applicable:

## 2024-09-22 ENCOUNTER — Other Ambulatory Visit: Payer: Self-pay | Admitting: Family Medicine

## 2024-09-22 DIAGNOSIS — K219 Gastro-esophageal reflux disease without esophagitis: Secondary | ICD-10-CM

## 2024-09-22 NOTE — Telephone Encounter (Unsigned)
 Copied from CRM #[id number]. Topic: Clinical - Medication Refill >> Sep 22, 2024 10:20 AM Thersia BROCKS wrote: Medication: omeprazole  (PRILOSEC) 40 MG capsule  Has the patient contacted their pharmacy? Yes (Agent: If no, request that the patient contact the pharmacy for the refill. If patient does not wish to contact the pharmacy document the reason why and proceed with request.) (Agent: If yes, when and what did the pharmacy advise?)  This is the patient's preferred pharmacy:  [facility] [address] ([address] [place] - [address] [place] ([place]) [place] [address] Phone: [phone] Fax: [phone]  Is this the correct pharmacy for this prescription? Yes If no, delete pharmacy and type the correct one.   Has the prescription been filled recently? No  Is the patient out of the medication? Yes  Has the patient been seen for an appointment in the last year OR does the patient have an upcoming appointment? Yes  Can we respond through MyChart? Yes  Agent: Please be advised that Rx refills may take up to 3 business days. We ask that you follow-up with your pharmacy.

## 2024-09-23 ENCOUNTER — Telehealth: Payer: Self-pay | Admitting: *Deleted

## 2024-09-23 NOTE — Telephone Encounter (Signed)
 CALLED PATIENT TO REMIND OF NEW HDR V[facility] FOR 09-26-24- ARRIVAL TIME- 7:20 AM, SPOKE WITH PATIENT AND SHE IS AWARE OF THESE APPTS.

## 2024-09-24 NOTE — Progress Notes (Signed)
 Radiation Oncology         [phone] ________________________________  Name: Bridget Butler MRN: [id number]  Date: 09/26/2024  DOB: Feb 19, 1946  Vaginal Brachytherapy Procedure Note  CC: Petrina Pries, NP Viktoria Comer SAUNDERS, MD  No diagnosis found.  Diagnosis: FIGO Stage II, calculated as Stage I (pT1, pN0, cM0) high-grade serous carcinoma of the endometrium   Cancer Staging  Endometrial cancer (HCC) Staging form: Corpus Uteri - Carcinoma and Carcinosarcoma, AJCC 8th Edition and FIGO 2023 - Pathologic stage from 07/29/2024: FIGO Stage II, calculated as Stage I (pT1, pN0, cM0, MMRd-) - Signed by Lonn Hicks, MD on 07/29/2024  Radiation Treatment Dates: ***  Narrative: She returns today for vaginal cylinder fitting. ***  Since her consultation date of 08/10/24, she received her first cycle of adjuvant chemotherapy consisting of Paclitaxel  (Taxol ) and Carboplatin  on 08/19/24 under the care of Dr. Lonn. She tolerated her first cycle of chemotherapy relatively well other than hyperglycemia, progressive anemia and 1 episode of nausea and vomiting.   She was also seen by uro-gyn on 09/01/24 for further management of her urinary retention and incontinence issues. Management options for urinary retention discussed at that time included CIC, indwelling foley, SPT, SNM, or reconstructive surgery. The patient and her daughter declined repeat CIC teaching due to prior difficulty. The patient has ultimately decided to continue foley catheter management at this time until she completes chemotherapy and pending radiation therapy.   Her foley cath was also replaced at that time.  ALLERGIES: is allergic to metformin  and related and other.  Meds: Current Outpatient Medications  Medication Sig Dispense Refill   amLODipine  (NORVASC ) 5 MG tablet Take 1 tablet (5 mg total) by mouth daily. 90 tablet 1   aspirin  81 MG chewable tablet Chew 81 mg by mouth daily.     Blood Glucose Monitoring Suppl (TRUE  METRIX METER) DEVI 1 Device by Does not apply route 2 (two) times daily. 1 each 2   calcium  carbonate (OSCAL) 1500 (600 Ca) MG TABS tablet Take 600 mg of elemental calcium  by mouth daily with breakfast.     Cholecalciferol  (VITAMIN D3) 125 MCG (5000 UT) CAPS Take 5,000 Units by mouth daily.     Continuous Glucose Sensor (FREESTYLE LIBRE 3 PLUS SENSOR) MISC 1 each by Does not apply route every 14 (fourteen) days. 6 each 3   dexamethasone  (DECADRON ) 4 MG tablet 2 tab the morning of chemotherapy, every 3 weeks, by mouth x 6 cycles 12 tablet 6   insulin  degludec (TRESIBA) 200 UNIT/ML FlexTouch Pen Inject 20 Units into the skin daily.     Insulin  Lispro-aabc (LYUMJEV  KWIKPEN) 100 UNIT/ML KwikPen Inject 4-6 Units into the skin 3 (three) times daily before meals. 15 mL 2   Insulin  Pen Needle 32G X 6 MM MISC 1 Act by Does not apply route once a week. 100 each 1   lidocaine -prilocaine  (EMLA ) cream Apply to affected area once 30 g 3   methimazole  (TAPAZOLE ) 5 MG tablet Take 1 tablet (5 mg total) by mouth every other day. 45 tablet 3   Multiple Vitamin (MULTIVITAMIN WITH MINERALS) TABS tablet Take 1 tablet by mouth daily.     olmesartan  (BENICAR ) 20 MG tablet Take 1 tablet (20 mg total) by mouth daily. 90 tablet 1   omeprazole  (PRILOSEC) 40 MG capsule TAKE 1 CAPSULE BY MOUTH TWICE DAILY IN THE MORNING AND AT BEDTIME 180 capsule 1   ondansetron  (ZOFRAN ) 8 MG tablet Take 1 tablet (8 mg total) by mouth every  8 (eight) hours as needed for nausea or vomiting. Start on the third day after chemotherapy. 30 tablet 1   polyethylene glycol (MIRALAX ) 17 g packet Take 17 g by mouth daily.     potassium chloride  (KLOR-CON  10) 10 MEQ tablet Take 1 tablet (10 mEq total) by mouth 2 (two) times daily. 180 tablet 1   pravastatin  (PRAVACHOL ) 40 MG tablet Take 1 tablet (40 mg total) by mouth at bedtime. 90 tablet 1   prochlorperazine  (COMPAZINE ) 10 MG tablet Take 1 tablet (10 mg total) by mouth every 6 (six) hours as needed for  nausea or vomiting. 30 tablet 1   Semaglutide ,0.25 or 0.5MG /DOS, (OZEMPIC , 0.25 OR 0.5 MG/DOSE,) 2 MG/3ML SOPN Inject 0.5mg  into skin weekly     senna-docusate (SENOKOT-S) 8.6-50 MG tablet Take 2 tablets by mouth at bedtime.     No current facility-administered medications for this encounter.    Physical Findings: The patient is in no acute distress. Patient is alert and oriented.  vitals were not taken for this visit.   No palpable cervical, supraclavicular or axillary lymphoadenopathy. The heart has a regular rate and rhythm. The lungs are clear to auscultation. Abdomen soft and non-tender.  On pelvic examination the external genitalia were unremarkable. A speculum exam was performed. Vaginal cuff intact, no mucosal lesions. On bimanual exam there were no pelvic masses appreciated.  Lab Findings: Lab Results  Component Value Date   WBC 9.2 09/09/2024   HGB 9.1 (L) 09/09/2024   HCT 27.4 (L) 09/09/2024   MCV 94.5 09/09/2024   PLT 228 09/09/2024    Radiographic Findings: No results found.  Impression: FIGO Stage II, calculated as Stage I (pT1, pN0, cM0) high-grade serous carcinoma of the endometrium  Patient was fitted for a vaginal cylinder. The patient will be treated with a *** cm diameter cylinder with a treatment length of *** cm. This distended the vaginal vault without undue discomfort. The patient tolerated the procedure well.  The patient was successfully fitted for a vaginal cylinder. The patient is appropriate to begin vaginal brachytherapy.   Plan: The patient will proceed with CT simulation and vaginal brachytherapy today.    _______________________________   Lynwood CHARM Nasuti, PhD, MD  This document serves as a record of services personally performed by Lynwood Nasuti, MD. It was created on his behalf by Dorthy Fuse, a trained medical scribe. The creation of this record is based on the scribe's personal observations and the provider's statements to them. This  document has been checked and approved by the attending provider.

## 2024-09-25 NOTE — Progress Notes (Signed)
  Radiation Oncology         [phone]) [phone] ________________________________  Name: Bridget Butler MRN: [id number]  Date: 09/26/2024  DOB: [birth year]  CC: Petrina Pries, NP  Viktoria Comer SAUNDERS, Bridget Butler  HDR BRACHYTHERAPY NOTE  DIAGNOSIS: FIGO Stage II, calculated as Stage I (pT1, pN0, cM0) high-grade serous carcinoma of the endometrium    Cancer Staging  Endometrial cancer [facility]) Staging form: Corpus Uteri - Carcinoma and Carcinosarcoma, AJCC 8th Edition and FIGO 2023 - Pathologic stage from 07/29/2024: FIGO Stage II, calculated as Stage I (pT1, pN0, cM0, MMRd-) - Signed by Lonn Hicks, Bridget Butler on 07/29/2024  Simple treatment device note: Patient had construction of her custom vaginal cylinder. She will be treated with a 3.0 cm diameter segmented cylinder. This conforms to her anatomy without undue discomfort.  Vaginal brachytherapy procedure node: The patient was brought to the HDR suite. Identity was confirmed. All relevant records and images related to the planned course of therapy were reviewed. The patient freely provided informed written consent to proceed with treatment after reviewing the details related to the planned course of therapy. The consent form was witnessed and verified by the simulation staff. Then, the patient was set-up in a stable reproducible supine position for radiation therapy. Pelvic exam revealed the vaginal cuff to be intact . The patient's custom vaginal cylinder was placed in the proximal vagina. This was affixed to the CT/MR stabilization plate to prevent slippage. Patient tolerated the placement well.  Verification simulation note:  A fiducial marker was placed within the vaginal cylinder. An AP and lateral film was then obtained through the pelvis area. This documented accurate position of the vaginal cylinder for treatment.  HDR BRACHYTHERAPY TREATMENT  The remote afterloading device was affixed to the vaginal cylinder by catheter. Patient then proceeded to undergo her  first high-dose-rate treatment directed at the proximal vagina. The patient was prescribed a dose of 6.0 gray to be delivered to the mucosal surface. Treatment length was 3.0 cm. Patient was treated with 1 channel using 7 dwell positions. Treatment time was 309.1 seconds. Iridium 192 was the high-dose-rate source for treatment. The patient tolerated the treatment well. After completion of her therapy, a radiation survey was performed documenting return of the iridium source into the [employer] safe.   PLAN: The patient will return next week for her second high-dose-rate treatment.  ________________________________  -----------------------------------  Bridget CHARM Nasuti, PhD, Bridget Butler  This document serves as a record of services personally performed by Bridget Nasuti, Bridget Butler. It was created on his behalf by Dorthy Fuse, a trained medical scribe. The creation of this record is based on the scribe's personal observations and the provider's statements to them. This document has been checked and approved by the attending provider.

## 2024-09-26 ENCOUNTER — Ambulatory Visit
Admission: RE | Admit: 2024-09-26 | Discharge: 2024-09-26 | Disposition: A | Discharge status: Home / Self Care | Source: Ambulatory Visit | Attending: Radiation Oncology | Admitting: Radiation Oncology

## 2024-09-26 ENCOUNTER — Ambulatory Visit
Admission: RE | Admit: 2024-09-26 | Discharge: 2024-09-26 | Disposition: A | Source: Ambulatory Visit | Attending: Radiation Oncology | Admitting: Radiation Oncology

## 2024-09-26 ENCOUNTER — Other Ambulatory Visit: Payer: Self-pay

## 2024-09-26 ENCOUNTER — Encounter: Payer: Self-pay | Admitting: Radiation Oncology

## 2024-09-26 ENCOUNTER — Ambulatory Visit
Admission: RE | Admit: 2024-09-26 | Discharge: 2024-09-26 | Attending: Radiation Oncology | Admitting: Radiation Oncology

## 2024-09-26 VITALS — BP 154/61 | HR 99 | Temp 97.1°F | Resp 18 | Ht 63.0 in | Wt 169.2 lb

## 2024-09-26 DIAGNOSIS — C541 Malignant neoplasm of endometrium: Secondary | ICD-10-CM

## 2024-09-26 LAB — RAD ONC ARIA SESSION SUMMARY
Course Elapsed Days: 0
Plan Fractions Treated to Date: 1
Plan Prescribed Dose Per Fraction: 6 Gy
Plan Total Fractions Prescribed: 5
Plan Total Prescribed Dose: 30 Gy
Reference Point Dosage Given to Date: 6 Gy
Reference Point Session Dosage Given: 6 Gy
Session Number: 1

## 2024-09-30 ENCOUNTER — Inpatient Hospital Stay: Admitting: Hematology and Oncology

## 2024-09-30 ENCOUNTER — Encounter: Payer: Self-pay | Admitting: Hematology and Oncology

## 2024-09-30 ENCOUNTER — Inpatient Hospital Stay

## 2024-09-30 ENCOUNTER — Inpatient Hospital Stay: Attending: Gynecologic Oncology

## 2024-09-30 VITALS — BP 156/66 | HR 94 | Temp 98.0°F | Resp 16 | Wt 168.5 lb

## 2024-09-30 DIAGNOSIS — Z5111 Encounter for antineoplastic chemotherapy: Secondary | ICD-10-CM | POA: Diagnosis present

## 2024-09-30 DIAGNOSIS — C541 Malignant neoplasm of endometrium: Secondary | ICD-10-CM | POA: Insufficient documentation

## 2024-09-30 DIAGNOSIS — R112 Nausea with vomiting, unspecified: Secondary | ICD-10-CM | POA: Diagnosis not present

## 2024-09-30 DIAGNOSIS — T451X5A Adverse effect of antineoplastic and immunosuppressive drugs, initial encounter: Secondary | ICD-10-CM | POA: Diagnosis not present

## 2024-09-30 DIAGNOSIS — Z79899 Other long term (current) drug therapy: Secondary | ICD-10-CM | POA: Diagnosis not present

## 2024-09-30 DIAGNOSIS — D6481 Anemia due to antineoplastic chemotherapy: Secondary | ICD-10-CM | POA: Diagnosis not present

## 2024-09-30 DIAGNOSIS — N950 Postmenopausal bleeding: Secondary | ICD-10-CM | POA: Insufficient documentation

## 2024-09-30 DIAGNOSIS — Z79633 Long term (current) use of mitotic inhibitor: Secondary | ICD-10-CM | POA: Diagnosis not present

## 2024-09-30 DIAGNOSIS — Z79630 Long term (current) use of alkylating agent: Secondary | ICD-10-CM | POA: Insufficient documentation

## 2024-09-30 LAB — CMP (CANCER CENTER ONLY)
ALT: 31 U/L (ref 0–44)
AST: 29 U/L (ref 15–41)
Albumin: 4 g/dL (ref 3.5–5.0)
Alkaline Phosphatase: 78 U/L (ref 38–126)
Anion gap: 9 (ref 5–15)
BUN: 17 mg/dL (ref 8–23)
CO2: 26 mmol/L (ref 22–32)
Calcium: 9.8 mg/dL (ref 8.9–10.3)
Chloride: 103 mmol/L (ref 98–111)
Creatinine: 1.07 mg/dL — ABNORMAL HIGH (ref 0.44–1.00)
GFR, Estimated: 53 mL/min — ABNORMAL LOW (ref >60)
Glucose, Bld: 187 mg/dL — ABNORMAL HIGH (ref 70–99)
Potassium: 4.3 mmol/L (ref 3.5–5.1)
Sodium: 138 mmol/L (ref 135–145)
Total Bilirubin: 0.2 mg/dL (ref 0.0–1.2)
Total Protein: 7.3 g/dL (ref 6.5–8.1)

## 2024-09-30 LAB — CBC WITH DIFFERENTIAL (CANCER CENTER ONLY)
Abs Immature Granulocytes: 0.02 K/uL (ref 0.00–0.07)
Basophils Absolute: 0 K/uL (ref 0.0–0.1)
Basophils Relative: 1 %
Eosinophils Absolute: 0 K/uL (ref 0.0–0.5)
Eosinophils Relative: 0 %
HCT: 25.9 % — ABNORMAL LOW (ref 36.0–46.0)
Hemoglobin: 8.8 g/dL — ABNORMAL LOW (ref 12.0–15.0)
Immature Granulocytes: 0 %
Lymphocytes Relative: 30 %
Lymphs Abs: 1.6 K/uL (ref 0.7–4.0)
MCH: 32.7 pg (ref 26.0–34.0)
MCHC: 34 g/dL (ref 30.0–36.0)
MCV: 96.3 fL (ref 80.0–100.0)
Monocytes Absolute: 0.6 K/uL (ref 0.1–1.0)
Monocytes Relative: 12 %
Neutro Abs: 3.2 K/uL (ref 1.7–7.7)
Neutrophils Relative %: 57 %
Platelet Count: 224 K/uL (ref 150–400)
RBC: 2.69 MIL/uL — ABNORMAL LOW (ref 3.87–5.11)
RDW: 15.5 % (ref 11.5–15.5)
WBC Count: 5.5 K/uL (ref 4.0–10.5)
nRBC: 0 % (ref 0.0–0.2)

## 2024-09-30 MED ORDER — DIPHENHYDRAMINE HCL 50 MG/ML IJ SOLN
12.5000 mg | Freq: Once | INTRAMUSCULAR | Status: AC
  Administered 2024-09-30: 12.5 mg via INTRAVENOUS
  Filled 2024-09-30: qty 1

## 2024-09-30 MED ORDER — SODIUM CHLORIDE 0.9 % IV SOLN
394.5000 mg | Freq: Once | INTRAVENOUS | Status: AC
  Administered 2024-09-30: 390 mg via INTRAVENOUS
  Filled 2024-09-30: qty 39

## 2024-09-30 MED ORDER — SODIUM CHLORIDE 0.9 % IV SOLN: INTRAVENOUS | Status: DC

## 2024-09-30 MED ORDER — DEXAMETHASONE SOD PHOSPHATE PF 10 MG/ML IJ SOLN
5.0000 mg | Freq: Once | INTRAMUSCULAR | Status: AC
  Administered 2024-09-30: 5 mg via INTRAVENOUS

## 2024-09-30 MED ORDER — FAMOTIDINE IN NACL 20-0.9 MG/50ML-% IV SOLN
20.0000 mg | Freq: Once | INTRAVENOUS | Status: AC
  Administered 2024-09-30: 20 mg via INTRAVENOUS
  Filled 2024-09-30: qty 50

## 2024-09-30 MED ORDER — PALONOSETRON HCL INJECTION 0.25 MG/5ML
0.2500 mg | Freq: Once | INTRAVENOUS | Status: AC
  Administered 2024-09-30: 0.25 mg via INTRAVENOUS
  Filled 2024-09-30: qty 5

## 2024-09-30 MED ORDER — SODIUM CHLORIDE 0.9 % IV SOLN
150.0000 mg | Freq: Once | INTRAVENOUS | Status: AC
  Administered 2024-09-30: 150 mg via INTRAVENOUS
  Filled 2024-09-30: qty 150

## 2024-09-30 MED ORDER — SODIUM CHLORIDE 0.9 % IV SOLN
135.0000 mg/m2 | Freq: Once | INTRAVENOUS | Status: AC
  Administered 2024-09-30: 252 mg via INTRAVENOUS
  Filled 2024-09-30: qty 42

## 2024-09-30 NOTE — Progress Notes (Signed)
 [facility] OFFICE PROGRESS NOTE  Patient Care Team: Petrina Pries, NP as PCP - General (Family Medicine) Jarold Mayo, MD as Consulting Physician (Ophthalmology) Trixie File, MD as Consulting Physician (Internal Medicine) Dallie Vera GAILS, MD as Consulting Physician (Obstetrics and Gynecology)  Assessment & Plan Endometrial cancer [facility]) The patient was diagnosed with uterine cancer after presentation of postmenopausal bleeding CT imaging showed large uterine mass and severely distended urinary bladder She had hysterectomy performed on July 20, 2024 and did well postoperatively without complications Final pathology: High-grade serous carcinoma without lymph node involvement, stage II disease  She tolerated cycle 1 of treatment well except for hyperglycemia, progressive anemia and 1 episode of nausea and vomiting Cycle 2 of chemotherapy was complicated by progressive anemia, vertigo and nausea She is noted to have progressive anemia likely secondary to side effects of treatment We will proceed without delay but I plan to add an extra week of break before she returns for her next cycle of treatment to allow bone marrow recovery  Anemia due to antineoplastic chemotherapy This is likely due to recent treatment. The patient denies recent history of bleeding such as epistaxis, hematuria or hematochezia. She is asymptomatic from the anemia. I will observe for now.  She does not require transfusion now. I will continue the chemotherapy at current dose without dosage adjustment.  If the anemia gets progressive worse in the future, I might have to delay her treatment or adjust the chemotherapy dose.  Nausea and vomiting, unspecified vomiting type She had nausea due to vertigo after treatment She has antiemetics to take as needed  No orders of the defined types were placed in this encounter.    [name], MD  INTERVAL HISTORY: she returns for treatment  follow-up Complications related to previous cycle of chemotherapy included anemia, and nausea with/without vomiting,  PHYSICAL EXAMINATION: ECOG PERFORMANCE STATUS: 2 - Symptomatic, <50% confined to bed  Lab Results  Component Value Date   CAN125 81.6 (H) 07/08/2024      Latest Ref Rng & Units 09/30/2024    9:14 AM 09/09/2024    9:07 AM 08/18/2024   12:32 PM  CBC  WBC 4.0 - 10.5 K/uL 5.5  9.2  6.5   Hemoglobin 12.0 - 15.0 g/dL 8.8  9.1  89.9   Hematocrit 36.0 - 46.0 % 25.9  27.4  29.9   Platelets 150 - 400 K/uL 224  228  284       Chemistry      Component Value Date/Time   NA 138 09/30/2024 0914   NA 137 06/04/2020 1535   K 4.3 09/30/2024 0914   CL 103 09/30/2024 0914   CO2 26 09/30/2024 0914   BUN 17 09/30/2024 0914   BUN 13 06/04/2020 1535   CREATININE 1.07 (H) 09/30/2024 0914      Component Value Date/Time   CALCIUM  9.8 09/30/2024 0914   ALKPHOS 78 09/30/2024 0914   AST 29 09/30/2024 0914   ALT 31 09/30/2024 0914   BILITOT 0.2 09/30/2024 0914       There were no vitals filed for this visit. There were no vitals filed for this visit. Other relevant data reviewed during this visit included CBC and CMP

## 2024-09-30 NOTE — Assessment & Plan Note (Addendum)
 This is likely due to recent treatment. The patient denies recent history of bleeding such as epistaxis, hematuria or hematochezia. She is asymptomatic from the anemia. I will observe for now.  She does not require transfusion now. I will continue the chemotherapy at current dose without dosage adjustment.  If the anemia gets progressive worse in the future, I might have to delay her treatment or adjust the chemotherapy dose.

## 2024-09-30 NOTE — Progress Notes (Signed)
 Taxol  diluted in NS Excel bag (B Braun),  Exp:06/19/2026, Lot:[id number]  Bridgett Leotis Helling, RPH, BCPS, BCOP 09/30/2024 10:46 AM

## 2024-09-30 NOTE — Assessment & Plan Note (Addendum)
 The patient was diagnosed with uterine cancer after presentation of postmenopausal bleeding CT imaging showed large uterine mass and severely distended urinary bladder She had hysterectomy performed on July 20, 2024 and did well postoperatively without complications Final pathology: High-grade serous carcinoma without lymph node involvement, stage II disease  She tolerated cycle 1 of treatment well except for hyperglycemia, progressive anemia and 1 episode of nausea and vomiting Cycle 2 of chemotherapy was complicated by progressive anemia, vertigo and nausea She is noted to have progressive anemia likely secondary to side effects of treatment We will proceed without delay but I plan to add an extra week of break before she returns for her next cycle of treatment to allow bone marrow recovery

## 2024-09-30 NOTE — Patient Instructions (Signed)
 [facility] - A DEPT OF [facility]  Discharge Instructions: Thank you for choosing [facility] to provide your oncology and hematology care.   If you have a lab appointment with the Cancer Center, please go directly to the Cancer Center and check in at the registration area.   Wear comfortable clothing and clothing appropriate for easy access to any Portacath or PICC line.   We strive to give you quality time with your provider. You may need to reschedule your appointment if you arrive late (15 or more minutes).  Arriving late affects you and other patients whose appointments are after yours.  Also, if you miss three or more appointments without notifying the office, you may be dismissed from the clinic at the provider's discretion.      For prescription refill requests, have your pharmacy contact our office and allow 72 hours for refills to be completed.    Today you received the following chemotherapy and/or immunotherapy agents: Paclitaxel  (Taxol ) & Carboplatin  (Paraplatin )      To help prevent nausea and vomiting after your treatment, we encourage you to take your nausea medication as directed.  BELOW ARE SYMPTOMS THAT SHOULD BE REPORTED IMMEDIATELY: *FEVER GREATER THAN 100.4 F (38 C) OR HIGHER *CHILLS OR SWEATING *NAUSEA AND VOMITING THAT IS NOT CONTROLLED WITH YOUR NAUSEA MEDICATION *UNUSUAL SHORTNESS OF BREATH *UNUSUAL BRUISING OR BLEEDING *URINARY PROBLEMS (pain or burning when urinating, or frequent urination) *BOWEL PROBLEMS (unusual diarrhea, constipation, pain near the anus) TENDERNESS IN MOUTH AND THROAT WITH OR WITHOUT PRESENCE OF ULCERS (sore throat, sores in mouth, or a toothache) UNUSUAL RASH, SWELLING OR PAIN  UNUSUAL VAGINAL DISCHARGE OR ITCHING   Items with * indicate a potential emergency and should be followed up as soon as possible or go to the Emergency Department if any problems should occur.  Please show the  CHEMOTHERAPY ALERT CARD or IMMUNOTHERAPY ALERT CARD at check-in to the Emergency Department and triage nurse.  Should you have questions after your visit or need to cancel or reschedule your appointment, please contact [facility] - A DEPT OF [facility]  Dept: [phone]  and follow the prompts.  Office hours are 8:00 a.m. to 4:30 p.m. Monday - Friday. Please note that voicemails left after 4:00 p.m. may not be returned until the following business day.  We are closed weekends and major holidays. You have access to a nurse at all times for urgent questions. Please call the main number to the clinic Dept: [phone] and follow the prompts.   For any non-urgent questions, you may also contact your provider using MyChart. We now offer e-Visits for anyone 41 and older to request care online for non-urgent symptoms. For details visit [email or web address].   Also download the MyChart app! Go to the app store, search MyChart, open the app, select [facility], and log in with your MyChart username and password.

## 2024-09-30 NOTE — Assessment & Plan Note (Addendum)
 She had nausea due to vertigo after treatment She has antiemetics to take as needed

## 2024-10-04 ENCOUNTER — Telehealth: Payer: Self-pay | Admitting: *Deleted

## 2024-10-04 NOTE — Telephone Encounter (Signed)
 CALLED PATIENT TO REMIND OF HDR TX. FOR 10-05-24 @ 3 PM, SPOKE WITH PATIENT AND SHE IS AWARE OF THIS TX.

## 2024-10-05 ENCOUNTER — Ambulatory Visit
Admission: RE | Admit: 2024-10-05 | Discharge: 2024-10-05 | Attending: Radiation Oncology | Admitting: Radiation Oncology

## 2024-10-05 ENCOUNTER — Other Ambulatory Visit: Payer: Self-pay

## 2024-10-05 DIAGNOSIS — C541 Malignant neoplasm of endometrium: Secondary | ICD-10-CM

## 2024-10-05 LAB — RAD ONC ARIA SESSION SUMMARY
Course Elapsed Days: 9
Plan Fractions Treated to Date: 2
Plan Prescribed Dose Per Fraction: 6 Gy
Plan Total Fractions Prescribed: 5
Plan Total Prescribed Dose: 30 Gy
Reference Point Dosage Given to Date: 12 Gy
Reference Point Session Dosage Given: 6 Gy
Session Number: 2

## 2024-10-05 NOTE — Progress Notes (Signed)
°  Radiation Oncology         [phone] ________________________________  Name: Bridget Butler MRN: [id number]  Date: 10/05/2024  DOB: 18-Jun-1946  CC: Petrina Pries, NP  Viktoria Comer SAUNDERS, MD  HDR BRACHYTHERAPY NOTE  DIAGNOSIS: FIGO Stage II, calculated as Stage I (pT1, pN0, cM0) high-grade serous carcinoma of the endometrium    Simple treatment device note: Patient had construction of her custom vaginal cylinder. She will be treated with a 3.0 cm diameter segmented cylinder. This conforms to her anatomy without undue discomfort.  Vaginal brachytherapy procedure node: The patient was brought to the HDR suite. Identity was confirmed. All relevant records and images related to the planned course of therapy were reviewed. The patient freely provided informed written consent to proceed with treatment after reviewing the details related to the planned course of therapy. The consent form was witnessed and verified by the simulation staff. Then, the patient was set-up in a stable reproducible supine position for radiation therapy. Pelvic exam revealed the vaginal cuff to be intact . The patient's custom vaginal cylinder was placed in the proximal vagina. This was affixed to the CT/MR stabilization plate to prevent slippage. Patient tolerated the placement well.  Verification simulation note:  A fiducial marker was placed within the vaginal cylinder. An AP and lateral film was then obtained through the pelvis area. This documented accurate position of the vaginal cylinder for treatment.  HDR BRACHYTHERAPY TREATMENT  The remote afterloading device was affixed to the vaginal cylinder by catheter. Patient then proceeded to undergo her 2nd high-dose-rate treatment directed at the proximal vagina. The patient was prescribed a dose of 6.0 gray to be delivered to the mucosal surface. Treatment length was 3.0 cm. Patient was treated with 1 channel using 7 dwell positions. Treatment time was 336.6 seconds.  Iridium 192 was the high-dose-rate source for treatment. The patient tolerated the treatment well. After completion of her therapy, a radiation survey was performed documenting return of the iridium source into the GammaMed safe.   PLAN: She will return next week for her third high-dose-rate brachytherapy treatment. ________________________________  Lynwood CHARM Nasuti, PhD, MD

## 2024-10-07 ENCOUNTER — Ambulatory Visit (INDEPENDENT_AMBULATORY_CARE_PROVIDER_SITE_OTHER)

## 2024-10-07 DIAGNOSIS — I152 Hypertension secondary to endocrine disorders: Secondary | ICD-10-CM

## 2024-10-07 DIAGNOSIS — E1159 Type 2 diabetes mellitus with other circulatory complications: Secondary | ICD-10-CM | POA: Diagnosis not present

## 2024-10-07 DIAGNOSIS — E1122 Type 2 diabetes mellitus with diabetic chronic kidney disease: Secondary | ICD-10-CM

## 2024-10-07 DIAGNOSIS — Z0001 Encounter for general adult medical examination with abnormal findings: Secondary | ICD-10-CM | POA: Diagnosis not present

## 2024-10-07 DIAGNOSIS — E1169 Type 2 diabetes mellitus with other specified complication: Secondary | ICD-10-CM | POA: Diagnosis not present

## 2024-10-07 DIAGNOSIS — E785 Hyperlipidemia, unspecified: Secondary | ICD-10-CM | POA: Diagnosis not present

## 2024-10-07 DIAGNOSIS — Z794 Long term (current) use of insulin: Secondary | ICD-10-CM

## 2024-10-07 DIAGNOSIS — E039 Hypothyroidism, unspecified: Secondary | ICD-10-CM

## 2024-10-07 DIAGNOSIS — N1831 Chronic kidney disease, stage 3a: Secondary | ICD-10-CM

## 2024-10-07 DIAGNOSIS — Z0000 Encounter for general adult medical examination without abnormal findings: Secondary | ICD-10-CM

## 2024-10-07 NOTE — Progress Notes (Cosign Needed)
 "  Chief Complaint  Patient presents with   Medicare Wellness     Subjective:   Bridget Butler is a 78 y.o. female who presents for a Medicare Annual Wellness Visit.  Visit info / Clinical Intake: Medicare Wellness Visit Type:: Subsequent Annual Wellness Visit Persons participating in visit and providing information:: patient Medicare Wellness Visit Mode:: Telephone If telephone:: video error If Telephone or Video please confirm:: I connected with patient using audio/video enable telemedicine. I verified patient identity with two identifiers, discussed telehealth limitations, and patient agreed to proceed. Patient Location:: home Provider Location:: office/clinic Interpreter Needed?: No Pre-visit prep was completed: yes AWV questionnaire completed by patient prior to visit?: yes Date:: 10/05/24 Living arrangements:: with family/others Patient's Overall Health Status Rating: good Typical amount of pain: none Does pain affect daily life?: no Are you currently prescribed opioids?: no  Dietary Habits and Nutritional Risks How many meals a day?: 3 Eats fruit and vegetables daily?: yes Most meals are obtained by: preparing own meals In the last 2 weeks, have you had any of the following?: none  Functional Status Activities of Daily Living (to include ambulation/medication): (Patient-Rptd) Independent Ambulation: (Patient-Rptd) Independent Medication Administration: (Patient-Rptd) Independent Home Management (perform basic housework or laundry): (Patient-Rptd) Independent Manage your own finances?: (Patient-Rptd) yes Primary transportation is: (Patient-Rptd) driving; family / friends  Fall Screening Falls in the past year?: (Patient-Rptd) 0 Number of falls in past year: 0 Was there an injury with Fall?: 0 Fall Risk Category Calculator: 0 Patient Fall Risk Level: Low Fall Risk  Fall Risk Patient at Risk for Falls Due to: No Fall Risks Fall risk Follow up: Falls evaluation  completed  Home and Transportation Safety: All rugs have non-skid backing?: (Patient-Rptd) yes All stairs or steps have railings?: (Patient-Rptd) yes Grab bars in the bathtub or shower?: (!) (Patient-Rptd) no Have non-skid surface in bathtub or shower?: (Patient-Rptd) yes Good home lighting?: (Patient-Rptd) yes Regular seat belt use?: (Patient-Rptd) yes Hospital stays in the last year:: (Patient-Rptd) no  Cognitive Assessment Difficulty concentrating, remembering, or making decisions? : (Patient-Rptd) no  Advance Directives (For Healthcare) Does Patient Have a Medical Advance Directive?: Yes Does patient want to make changes to medical advance directive?: No - Patient declined Type of Advance Directive: Healthcare Power of [place]; Living will Copy of Healthcare Power of Attorney in Chart?: Yes - validated most recent copy scanned in chart (See row information) Copy of Living Will in Chart?: Yes - validated most recent copy scanned in chart (See row information)    Allergies (verified) Metformin  and related and Other   Current Medications (verified) Outpatient Encounter Medications as of 10/07/2024  Medication Sig   amLODipine  (NORVASC ) 5 MG tablet Take 1 tablet (5 mg total) by mouth daily.   aspirin  81 MG chewable tablet Chew 81 mg by mouth daily.   Blood Glucose Monitoring Suppl (TRUE METRIX METER) DEVI 1 Device by Does not apply route 2 (two) times daily.   calcium  carbonate (OSCAL) 1500 (600 Ca) MG TABS tablet Take 600 mg of elemental calcium  by mouth daily with breakfast.   Cholecalciferol  (VITAMIN D3) 125 MCG (5000 UT) CAPS Take 5,000 Units by mouth daily.   Continuous Glucose Sensor (FREESTYLE LIBRE 3 PLUS SENSOR) MISC 1 each by Does not apply route every 14 (fourteen) days.   dexamethasone  (DECADRON ) 4 MG tablet 2 tab the morning of chemotherapy, every 3 weeks, by mouth x 6 cycles   insulin  degludec (TRESIBA) 200 UNIT/ML FlexTouch Pen Inject 20 Units into the skin  daily.    Insulin  Lispro-aabc (LYUMJEV  KWIKPEN) 100 UNIT/ML KwikPen Inject 4-6 Units into the skin 3 (three) times daily before meals.   Insulin  Pen Needle 32G X 6 MM MISC 1 Act by Does not apply route once a week.   lidocaine -prilocaine  (EMLA ) cream Apply to affected area once   methimazole  (TAPAZOLE ) 5 MG tablet Take 1 tablet (5 mg total) by mouth every other day.   Multiple Vitamin (MULTIVITAMIN WITH MINERALS) TABS tablet Take 1 tablet by mouth daily.   olmesartan  (BENICAR ) 20 MG tablet Take 1 tablet (20 mg total) by mouth daily.   omeprazole  (PRILOSEC) 40 MG capsule TAKE 1 CAPSULE BY MOUTH TWICE DAILY IN THE MORNING AND AT BEDTIME   ondansetron  (ZOFRAN ) 8 MG tablet Take 1 tablet (8 mg total) by mouth every 8 (eight) hours as needed for nausea or vomiting. Start on the third day after chemotherapy.   polyethylene glycol (MIRALAX ) 17 g packet Take 17 g by mouth daily.   potassium chloride  (KLOR-CON  10) 10 MEQ tablet Take 1 tablet (10 mEq total) by mouth 2 (two) times daily.   pravastatin  (PRAVACHOL ) 40 MG tablet Take 1 tablet (40 mg total) by mouth at bedtime.   prochlorperazine  (COMPAZINE ) 10 MG tablet Take 1 tablet (10 mg total) by mouth every 6 (six) hours as needed for nausea or vomiting.   Semaglutide ,0.25 or 0.5MG /DOS, (OZEMPIC , 0.25 OR 0.5 MG/DOSE,) 2 MG/3ML SOPN Inject 0.5mg  into skin weekly   senna-docusate (SENOKOT-S) 8.6-50 MG tablet Take 2 tablets by mouth at bedtime.   No facility-administered encounter medications on file as of 10/07/2024.    History: Past Medical History:  Diagnosis Date   Cancer [facility])    endometrial stage 2   Cataract    had surgery   Diabetes mellitus without complication (HCC)    GERD (gastroesophageal reflux disease)    Hyperlipidemia    Hypertension    Multiple thyroid  nodules    Thyroid  disease    Past Surgical History:  Procedure Laterality Date   ABDOMINAL HYSTERECTOMY  07/20/2024   ANTERIOR AND POSTERIOR REPAIR N/A 07/20/2024   Procedure:  POSTERIOR REPAIR (RECTOCELE);  Surgeon: Guadlupe Lianne DASEN, MD;  Location: [facility];  Service: Gynecology;  Laterality: N/A;  possible posterior repair   CHOLECYSTECTOMY     COSMETIC SURGERY Bilateral    eyelids   CYSTOSCOPY N/A 07/20/2024   Procedure: CYSTOSCOPY;  Surgeon: Guadlupe Lianne DASEN, MD;  Location: [facility];  Service: Gynecology;  Laterality: N/A;   EYE SURGERY     GALLBLADDER SURGERY     INJECTION, FOR SENTINEL LYMPH NODE IDENTIFICATION N/A 07/20/2024   Procedure: INJECTION, FOR SENTINEL LYMPH NODE IDENTIFICATION;  Surgeon: Viktoria Comer SAUNDERS, MD;  Location: [facility];  Service: Gynecology;  Laterality: N/A;   IR IMAGING GUIDED PORT INSERTION  08/11/2024   LAPAROSCOPY, OMENTECTOMY N/A 07/20/2024   Procedure: LAPAROSCOPY, OMENTECTOMY;  Surgeon: Viktoria Comer SAUNDERS, MD;  Location: [facility];  Service: Gynecology;  Laterality: N/A;   LYMPH NODE BIOPSY N/A 07/20/2024   Procedure: LYMPH NODE BIOPSY;  Surgeon: Viktoria Comer SAUNDERS, MD;  Location: [facility];  Service: Gynecology;  Laterality: N/A;   MICROLARYNGOSCOPY WITH CO2 LASER AND EXCISION OF VOCAL CORD LESION N/A 04/02/2020   Procedure: MICROLARYNGOSCOPY WITH DILATION AND CO2 LASER AND EXCISION OF VOCAL CORD LESION w/MITOMYCIN  C;  Surgeon: Carlie Clark, MD;  Location: [facility] OR;  Service: ENT;  Laterality: N/A;   MICROLARYNGOSCOPY WITH DILATION N/A 05/23/2020   Procedure: MICROLARYNGOSCOPY W/ DILATION AND JET VENTILATION;  Surgeon: Carlie Clark, MD;  Location: [facility] OR;  Service: ENT;  Laterality: N/A;   MICROLARYNGOSCOPY WITH DILATION N/A 07/13/2020   Procedure: MICROLARYNGOSCOPY with Jet Ventilation;  Surgeon: Carlie Clark, MD;  Location: [facility] OR;  Service: ENT;  Laterality: N/A;   ROBOTIC ASSISTED TOTAL HYSTERECTOMY WITH BILATERAL SALPINGO OOPHERECTOMY Bilateral 07/20/2024   Procedure: HYSTERECTOMY, TOTAL, ROBOT-ASSISTED, LAPAROSCOPIC, WITH BILATERAL SALPINGO-OOPHORECTOMY;  Surgeon: Viktoria Comer SAUNDERS, MD;  Location: [facility];  Service: Gynecology;  Laterality:  Bilateral;   TRACHEOSTOMY TUBE PLACEMENT N/A 04/02/2020   Procedure: Awake TRACHEOSTOMY;  Surgeon: Carlie Clark, MD;  Location: [facility] OR;  Service: ENT;  Laterality: N/A;   TUBAL LIGATION     UTEROSACRAL LIGAMENT SUSPENSION N/A 07/20/2024   Procedure: UTEROSACRAL LIGAMENT SUSPENSION;  Surgeon: Guadlupe Lianne DASEN, MD;  Location: [facility];  Service: Gynecology;  Laterality: N/A;   Family History  Problem Relation Age of Onset   Heart disease Mother    Hypertension Mother    Cancer Sister    Uterine cancer Sister    Diabetes Sister    Diabetes Sister    Diabetes Sister    Diabetes Sister    Diabetes Sister    Diabetes Brother    Cancer Maternal Aunt    Breast cancer Maternal Aunt    Breast cancer Maternal Aunt    Hypertension Maternal Grandmother    Breast cancer Cousin    Prostate cancer Neg Hx    Ovarian cancer Neg Hx    Pancreatic cancer Neg Hx    Colon cancer Neg Hx    Renal cancer Neg Hx    Social History   Occupational History   Occupation: Retired  Tobacco Use   Smoking status: Never    Passive exposure: Never   Smokeless tobacco: Never  Vaping Use   Vaping status: Never Used  Substance and Sexual Activity   Alcohol use: No   Drug use: No   Sexual activity: Not Currently    Partners: Male    Birth control/protection: None   Tobacco Counseling Counseling given: Yes  SDOH Screenings   Food Insecurity: No Food Insecurity (09/05/2024)  Housing: Low Risk (09/05/2024)  Transportation Needs: No Transportation Needs (09/05/2024)  Utilities: Not At Risk (08/02/2024)  Alcohol Screen: Low Risk (09/04/2023)  Depression (PHQ2-9): Low Risk (09/07/2024)  Financial Resource Strain: Low Risk (09/05/2024)  Physical Activity: Insufficiently Active (09/05/2024)  Social Connections: Moderately Integrated (09/05/2024)  Stress: No Stress Concern Present (09/05/2024)  Tobacco Use: Low Risk (10/07/2024)  Health Literacy: Adequate Health Literacy (09/04/2023)   See flowsheets for full  screening details  Depression Screen PHQ 2 & 9 Depression Scale- Over the past 2 weeks, how often have you been bothered by any of the following problems? Little interest or pleasure in doing things: 0 Feeling down, depressed, or hopeless (PHQ Adolescent also includes...irritable): 0 PHQ-2 Total Score: 0 Trouble falling or staying asleep, or sleeping too much: 0 Feeling tired or having little energy: 0 Poor appetite or overeating (PHQ Adolescent also includes...weight loss): 0 Feeling bad about yourself - or that you are a failure or have let yourself or your family down: 0 Trouble concentrating on things, such as reading the newspaper or watching television (PHQ Adolescent also includes...like school work): 0 Moving or speaking so slowly that other people could have noticed. Or the opposite - being so fidgety or restless that you have been moving around a lot more than usual: 0 Thoughts that you would be better off dead, or of hurting yourself in  some way: 0 PHQ-9 Total Score: 0 If you checked off any problems, how difficult have these problems made it for you to do your work, take care of things at home, or get along with other people?: Not difficult at all     Goals Addressed   None          Objective:    Today's Vitals   There is no height or weight on file to calculate BMI.  Hearing/Vision screen No results found. Immunizations and Health Maintenance Health Maintenance  Topic Date Due   DTaP/Tdap/Td (1 - Tdap) Never done   Zoster Vaccines- Shingrix  (1 of 2) Never done   COVID-19 Vaccine (4 - 2025-26 season) 06/20/2024   Influenza Vaccine  01/17/2025 (Originally 05/20/2024)   HEMOGLOBIN A1C  01/16/2025   OPHTHALMOLOGY EXAM  04/05/2025   FOOT EXAM  04/27/2025   Diabetic kidney evaluation - Urine ACR  08/30/2025   Diabetic kidney evaluation - eGFR measurement  09/30/2025   Medicare Annual Wellness (AWV)  10/07/2025   Pneumococcal Vaccine: 50+ Years  Completed   Bone  Density Scan  Completed   Hepatitis C Screening  Completed   Meningococcal B Vaccine  Aged Out   Mammogram  Discontinued   Colonoscopy  Discontinued        Assessment/Plan:  This is a routine wellness examination for Janeisha.  Patient Care Team: Petrina Pries, NP as PCP - General (Family Medicine) Jarold Mayo, MD as Consulting Physician (Ophthalmology) Trixie File, MD as Consulting Physician (Internal Medicine) Dallie Vera GAILS, MD as Consulting Physician (Obstetrics and Gynecology)  I have personally reviewed and noted the following in the patients chart:   Medical and social history Use of alcohol, tobacco or illicit drugs  Current medications and supplements including opioid prescriptions. Functional ability and status Nutritional status Physical activity Advanced directives List of other physicians Hospitalizations, surgeries, and ER visits in previous 12 months Vitals Screenings to include cognitive, depression, and falls Referrals and appointments  No orders of the defined types were placed in this encounter.  In addition, I have reviewed and discussed with patient certain preventive protocols, quality metrics, and best practice recommendations. A written personalized care plan for preventive services as well as general preventive health recommendations were provided to patient.   Richerd ONEIDA Lemmings, CMA   10/07/2024   Return in 1 year (on 10/07/2025).  After Visit Summary: (MyChart) Due to this being a telephonic visit, the after visit summary with patients personalized plan was offered to patient via MyChart   Nurse Notes: Richerd Lemmings, CMA  "

## 2024-10-07 NOTE — Patient Instructions (Signed)

## 2024-10-10 ENCOUNTER — Telehealth: Payer: Self-pay | Admitting: *Deleted

## 2024-10-10 NOTE — Telephone Encounter (Signed)
 Called patient to remind of HDR Tx. for 10-11-24, spoke with patient and she is aware of this tx.

## 2024-10-10 NOTE — Progress Notes (Signed)
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 She will be treated with a 3.0 cm diameter segmented cylinder. This conforms to her anatomy without undue discomfort.  Vaginal brachytherapy procedure node: The patient was brought to the HDR suite. Identity was confirmed. All relevant records and images related to the planned course of therapy were reviewed. The patient freely provided informed written consent to proceed with treatment after reviewing the details related to the planned course of therapy. The consent form was witnessed and verified by the simulation staff. Then, the patient was set-up in a stable reproducible supine position for radiation therapy. Pelvic exam revealed the vaginal cuff to be intact . The patient's custom vaginal cylinder was placed in the proximal vagina. This was affixed to the CT/MR stabilization plate to prevent slippage. Patient tolerated the placement well.  Verification simulation note:  A fiducial marker was placed within the vaginal cylinder. An AP and lateral film was then obtained through the pelvis area. This documented accurate position of the vaginal cylinder for treatment.  HDR BRACHYTHERAPY TREATMENT  The remote afterloading device was affixed to the vaginal cylinder by catheter. Patient then proceeded to undergo her third high-dose-rate treatment directed at the proximal vagina. The patient was prescribed a dose of 6.0 gray to be delivered to the mucosal surface. Treatment length was 3.0 cm. Patient was treated with 1 channel using 7 dwell positions. Treatment time was 356.1 seconds.  Iridium 192 was the high-dose-rate source for treatment. The patient tolerated the treatment well. After completion of her therapy, a radiation survey was performed documenting return of the iridium source into the [employer] safe.   PLAN: She will return next week for her fourth high-dose-rate brachytherapy treatment. ________________________________  Lynwood CHARM Nasuti, PhD, MD    This document serves as a record of services personally performed by Lynwood Nasuti, MD. It was created on his behalf by Reymundo Cartwright, a trained medical scribe. The creation of this record is based on the scribe's personal observations and the provider's statements to them. This document has been checked and approved by the attending provider.  "

## 2024-10-11 ENCOUNTER — Inpatient Hospital Stay

## 2024-10-11 ENCOUNTER — Ambulatory Visit
Admission: RE | Admit: 2024-10-11 | Discharge: 2024-10-11 | Disposition: A | Discharge status: Home / Self Care | Source: Ambulatory Visit | Attending: Radiation Oncology | Admitting: Radiation Oncology

## 2024-10-11 ENCOUNTER — Other Ambulatory Visit: Payer: Self-pay

## 2024-10-11 DIAGNOSIS — C541 Malignant neoplasm of endometrium: Secondary | ICD-10-CM | POA: Diagnosis not present

## 2024-10-11 LAB — RAD ONC ARIA SESSION SUMMARY
Course Elapsed Days: 15
Plan Fractions Treated to Date: 3
Plan Prescribed Dose Per Fraction: 6 Gy
Plan Total Fractions Prescribed: 5
Plan Total Prescribed Dose: 30 Gy
Reference Point Dosage Given to Date: 18 Gy
Reference Point Session Dosage Given: 6 Gy
Session Number: 3

## 2024-10-14 ENCOUNTER — Encounter: Payer: Self-pay | Admitting: Obstetrics

## 2024-10-14 ENCOUNTER — Ambulatory Visit: Admitting: Obstetrics

## 2024-10-14 VITALS — BP 138/78 | HR 114

## 2024-10-14 DIAGNOSIS — R339 Retention of urine, unspecified: Secondary | ICD-10-CM

## 2024-10-14 NOTE — Progress Notes (Signed)
 Bridget Butler  Date of Visit: 10/14/2024  History of Present Illness: Bridget Butler is a 78 y.o. female scheduled today for a post-operative visit.   Surgery: s/p Exam under anesthesia, robotic assisted laparoscopic uterosacral ligament suspension, posterior repair, cystourethroscopy with bladder biopsy  on 07/20/24  Her daughter underwent CIC teaching POD#5.  Foley catheter replaced POD#6.  Postoperative course was uncomplicated.   Today she reports doing well, reports fatigue and feeling sick day 3 after chemotherapy and sometimes does not empty her bladder regularly.  On bladder and blood sugar log: - glucose ranging from 74-283 - unplugs foley 1-3x/day, draining volume 300-1073mL Daughter reports unplugging around 2x/day. - fluid intake 20-60oz/day Denies leakage, pain or discomfort from foley  UTI in the last 6 weeks? No  Pain? No  She has returned to her normal activity (except for postop restrictions) Vaginal bulge? No  Bowel issues: No , bowel movements every 2 days  Subjective Success: Do you usually have a bulge or something falling out that you can see or feel in the vaginal area? No  Retreatment Success: Any retreatment with surgery or pessary for any compartment? No   Pathology results:  A. RIGHT OBTURATOR SENTINEL LYMPH NODE BIOPSY: - Lymph node, negative for carcinoma (0/1)  B. LEFT EXTERNAL ILIAC VEIN SENTINEL LYMPH NODE BIOPSY: - Lymph node, negative for carcinoma (0/1)  C. UTERUS, CERVIX, BILATERAL FALLOPIAN TUBES AND OVARIES: - High-grade serous carcinoma, involving anterior and posterior endometrium - Carcinoma invades more than 50% of the myometrium - The cervical surface is involved but definite stromal involvement is not seen - No evidence of lymphovascular invasion - Benign unremarkable bilateral ovaries and fallopian tubes - See oncology table - See comment  D. OMENTUM: - Portion of omentum, negative for carcinoma  E. RIGHT EXTERNAL  ILIAC VEIN SENTINEL LYMPH NODE BIOPSY: - Lymph node, negative for carcinoma (0/1)  F. BLADDER BIOPSY: - Bladder mucosa with mild acute inflammation and reactive changes - Negative for dysplasia or carcinoma   Medications: She has a current medication list which includes the following prescription(s): amlodipine , aspirin , true metrix meter, calcium  carbonate, vitamin d3, freestyle libre 3 plus sensor, dexamethasone , insulin  degludec, lyumjev  kwikpen, insulin  pen needle, lidocaine -prilocaine , methimazole , multivitamin with minerals, olmesartan , omeprazole , ondansetron , polyethylene glycol, potassium chloride , pravastatin , prochlorperazine , ozempic  (0.25 or 0.5 mg/dose), and senna-docusate.   Allergies: Patient is allergic to metformin  and related and other.   Physical Exam: BP 138/78   Pulse (!) 114   Abdomen: soft, non-tender, without masses or organomegaly Laparoscopic Incisions: healing well, no significant drainage, no dehiscence.  Pelvic Examination: deferred, no prolapse noted  Foley and stat lock in place with plug.   Verbal consent was obtained to perform office void trial:   Urethra was visualized with a 43F catheter was placed and bladder was drained completely. The bladder was then backfilled with sterile water  by gravity to with minimal sensation of pressure noted.  Foley removed with leakage noted bladder spasm upon foley removal. She was allowed to void a few droplets on her own.  A 43Fr foley catheter was replaced with balloon inflated, noted in bag. Stat lock applied to left medial thigh. Leg bag placed on foley tubing and secured to stat lock.  ---------------------------------------------------------  Assessment and Plan:  1. Urinary retention      Urinary retention Assessment & Plan: - urinary retention incontinence, discussed possible stress urinary incontinence. However did not recommend anti-incontinence procedure until after resolution of urinary  retention - no signs  of outlet obstruction with resolution of prolapse since robotic assisted laparoscopic uterosacral ligament suspension, posterior repair 07/20/24 - failed repeated void trial x 2, unplugs 1-3x/day. Discussed need to increase unplugging foley to prevent overdistention of bladder. Continue diary to record UOP and glucose to avoid detrusor overdistention with catheter plug use. Foley catheter replaced, return in 4-6 weeks for repeat void trial. Encouraged to increase frequency of bladder drainage if volume > . Discussed risk of prolonged catheterization such as UTI, urethral trauma, fistula, urinary leakage.  - marked bladder distention without hydronephrosis on CT in 01/30/23 - reviewed management options for urinary retention includes CIC, indwelling foley, SPT, SNM, or reconstructive surgery. Pt and her daughter declined repeat CIC teaching today due to prior difficulty, desires to return in 4 weeks to reassess after increased frequency of unplugging foley - through joint decision making, pt desires to continue foley catheter management at this time until completion of chemotherapy treatment and radiation. Declines additional surgical procedures at this time. Reassess at follow-up.     - Bladder biopsy pathology benign.  - Can resume regular activity including exercise and intercourse,  if desired.  - Discussed avoidance of heavy lifting and straining long term to reduce the risk of recurrence.   All questions answered.   Return in about 4 weeks (around 11/11/2024) for repeat void trial and CIC teaching.

## 2024-10-14 NOTE — Patient Instructions (Addendum)
 Increase unplugging foley to every 3 hours, increase if volume > .   Place foley bag for night time urinary drainage.

## 2024-10-14 NOTE — Assessment & Plan Note (Addendum)
-   urinary retention incontinence, discussed possible stress urinary incontinence. However did not recommend anti-incontinence procedure until after resolution of urinary retention - no signs of outlet obstruction with resolution of prolapse since robotic assisted laparoscopic uterosacral ligament suspension, posterior repair 07/20/24 - failed repeated void trial x 2, unplugs 1-3x/day. Discussed need to increase unplugging foley to prevent overdistention of bladder. Continue diary to record UOP and glucose to avoid detrusor overdistention with catheter plug use. Foley catheter replaced, return in 4-6 weeks for repeat void trial. Encouraged to increase frequency of bladder drainage if volume > . Discussed risk of prolonged catheterization such as UTI, urethral trauma, fistula, urinary leakage.  - marked bladder distention without hydronephrosis on CT in 01/30/23 - reviewed management options for urinary retention includes CIC, indwelling foley, SPT, SNM, or reconstructive surgery. Pt and her daughter declined repeat CIC teaching today due to prior difficulty, desires to return in 4 weeks to reassess after increased frequency of unplugging foley - through joint decision making, pt desires to continue foley catheter management at this time until completion of chemotherapy treatment and radiation. Declines additional surgical procedures at this time. Reassess at follow-up.

## 2024-10-18 ENCOUNTER — Telehealth: Payer: Self-pay | Admitting: *Deleted

## 2024-10-18 NOTE — Telephone Encounter (Signed)
 Called patient to remind of HDR Tx. for 10-19-24 @ 2 pm, spoke with patient and she is aware of this tx.

## 2024-10-18 NOTE — Progress Notes (Signed)
" °  Radiation Oncology         [phone] ________________________________  Name: Bridget Butler MRN: [id number]  Date: 10/19/2024  DOB: [birth year]  CC: Petrina Pries, NP  Viktoria Comer SAUNDERS, MD  HDR BRACHYTHERAPY NOTE  DIAGNOSIS: FIGO Stage II, calculated as Stage I (pT1, pN0, cM0) high-grade serous carcinoma of the endometrium    Simple treatment device note: Patient had construction of her custom vaginal cylinder. She will be treated with a 3.0 cm diameter segmented cylinder. This conforms to her anatomy without undue discomfort.  Vaginal brachytherapy procedure node: The patient was brought to the HDR suite. Identity was confirmed. All relevant records and images related to the planned course of therapy were reviewed. The patient freely provided informed written consent to proceed with treatment after reviewing the details related to the planned course of therapy. The consent form was witnessed and verified by the simulation staff. Then, the patient was set-up in a stable reproducible supine position for radiation therapy. Pelvic exam revealed the vaginal cuff to be intact . The patient's custom vaginal cylinder was placed in the proximal vagina. This was affixed to the CT/MR stabilization plate to prevent slippage. Patient tolerated the placement well.  Verification simulation note:  A fiducial marker was placed within the vaginal cylinder. An AP and lateral film was then obtained through the pelvis area. This documented accurate position of the vaginal cylinder for treatment.  HDR BRACHYTHERAPY TREATMENT  The remote afterloading device was affixed to the vaginal cylinder by catheter. Patient then proceeded to undergo her fourth high-dose-rate treatment directed at the proximal vagina. The patient was prescribed a dose of 6.0 gray to be delivered to the mucosal surface. Treatment length was 3.0 cm. Patient was treated with 1 channel using 7 dwell positions. Treatment time was 383.8 seconds.  Iridium 192 was the high-dose-rate source for treatment. The patient tolerated the treatment well. After completion of her therapy, a radiation survey was performed documenting return of the iridium source into the Gamma[employer] safe.   PLAN: She will return next week for her fifth high-dose-rate brachytherapy treatment. ________________________________  Lynwood CHARM Nasuti, PhD, MD    This document serves as a record of services personally performed by Lynwood Nasuti, MD. It was created on his behalf by Reymundo Cartwright, a trained medical scribe. The creation of this record is based on the scribe's personal observations and the provider's statements to them. This document has been checked and approved by the attending provider.  "

## 2024-10-19 ENCOUNTER — Ambulatory Visit
Admission: RE | Admit: 2024-10-19 | Discharge: 2024-10-19 | Disposition: A | Discharge status: Home / Self Care | Source: Ambulatory Visit | Attending: Radiation Oncology | Admitting: Radiation Oncology

## 2024-10-19 ENCOUNTER — Other Ambulatory Visit: Payer: Self-pay

## 2024-10-19 DIAGNOSIS — C541 Malignant neoplasm of endometrium: Secondary | ICD-10-CM

## 2024-10-19 LAB — RAD ONC ARIA SESSION SUMMARY
Course Elapsed Days: 23
Plan Fractions Treated to Date: 4
Plan Prescribed Dose Per Fraction: 6 Gy
Plan Total Fractions Prescribed: 5
Plan Total Prescribed Dose: 30 Gy
Reference Point Dosage Given to Date: 24 Gy
Reference Point Session Dosage Given: 6 Gy
Session Number: 4

## 2024-10-21 ENCOUNTER — Other Ambulatory Visit: Payer: Self-pay | Admitting: Internal Medicine

## 2024-10-25 ENCOUNTER — Ambulatory Visit: Admitting: Radiation Oncology

## 2024-10-26 ENCOUNTER — Telehealth: Payer: Self-pay | Admitting: *Deleted

## 2024-10-26 ENCOUNTER — Encounter: Payer: Self-pay | Admitting: Family Medicine

## 2024-10-26 NOTE — Telephone Encounter (Signed)
 Called patient to remind of HDR Tx. for 10-27-24 @  3 pm, lvm for a return call

## 2024-10-26 NOTE — Progress Notes (Signed)
" °  Radiation Oncology         [phone]) [phone] ________________________________  Name: Bridget Butler MRN: [id number]  Date: 10/27/2024  DOB: 03-Oct-1946  CC: Petrina Pries, NP  Viktoria Comer SAUNDERS, MD  HDR BRACHYTHERAPY NOTE  DIAGNOSIS: FIGO Stage II, calculated as Stage I (pT1, pN0, cM0) high-grade serous carcinoma of the endometrium    Simple treatment device note: Patient had construction of her custom vaginal cylinder. She will be treated with a 3.0 cm diameter segmented cylinder. This conforms to her anatomy without undue discomfort.  Vaginal brachytherapy procedure node: The patient was brought to the HDR suite. Identity was confirmed. All relevant records and images related to the planned course of therapy were reviewed. The patient freely provided informed written consent to proceed with treatment after reviewing the details related to the planned course of therapy. The consent form was witnessed and verified by the simulation staff. Then, the patient was set-up in a stable reproducible supine position for radiation therapy. Pelvic exam revealed the vaginal cuff to be intact . The patient's custom vaginal cylinder was placed in the proximal vagina. This was affixed to the CT/MR stabilization plate to prevent slippage. Patient tolerated the placement well.  Verification simulation note:  A fiducial marker was placed within the vaginal cylinder. An AP and lateral film was then obtained through the pelvis area. This documented accurate position of the vaginal cylinder for treatment.  HDR BRACHYTHERAPY TREATMENT  The remote afterloading device was affixed to the vaginal cylinder by catheter. Patient then proceeded to undergo her fifth high-dose-rate treatment directed at the proximal vagina. The patient was prescribed a dose of 6.0 gray to be delivered to the mucosal surface. Treatment length was 3.0 cm. Patient was treated with 1 channel using 7 dwell positions. Treatment time was 413.7 seconds.  Iridium 192 was the high-dose-rate source for treatment. The patient tolerated the treatment well. After completion of her therapy, a radiation survey was performed documenting return of the iridium source into the GammaMed safe.   PLAN: patient underwent her fifth and last high-dose-rate brachytherapy treatment. She will return next month for a follow up visit.  Overall she tolerated this treatment quite well without any significant side effects. ________________________________  Lynwood CHARM Nasuti, PhD, MD    This document serves as a record of services personally performed by Lynwood Nasuti, MD. It was created on his behalf by Reymundo Cartwright, a trained medical scribe. The creation of this record is based on the scribe's personal observations and the provider's statements to them. This document has been checked and approved by the attending provider.  "

## 2024-10-27 ENCOUNTER — Ambulatory Visit
Admission: RE | Admit: 2024-10-27 | Discharge: 2024-10-27 | Disposition: A | Discharge status: Home / Self Care | Source: Ambulatory Visit | Attending: Radiation Oncology | Admitting: Radiation Oncology

## 2024-10-27 ENCOUNTER — Other Ambulatory Visit: Payer: Self-pay

## 2024-10-27 DIAGNOSIS — C541 Malignant neoplasm of endometrium: Secondary | ICD-10-CM | POA: Insufficient documentation

## 2024-10-27 LAB — RAD ONC ARIA SESSION SUMMARY
Course Elapsed Days: 31
Plan Fractions Treated to Date: 5
Plan Prescribed Dose Per Fraction: 6 Gy
Plan Total Fractions Prescribed: 5
Plan Total Prescribed Dose: 30 Gy
Reference Point Dosage Given to Date: 30 Gy
Reference Point Session Dosage Given: 6 Gy
Session Number: 5

## 2024-10-27 MED FILL — Fosaprepitant Dimeglumine For IV Infusion 150 MG (Base Eq): INTRAVENOUS | Qty: 5 | Status: AC

## 2024-10-28 ENCOUNTER — Inpatient Hospital Stay: Attending: Gynecologic Oncology

## 2024-10-28 ENCOUNTER — Inpatient Hospital Stay

## 2024-10-28 ENCOUNTER — Encounter: Payer: Self-pay | Admitting: Hematology and Oncology

## 2024-10-28 ENCOUNTER — Inpatient Hospital Stay: Admitting: Hematology and Oncology

## 2024-10-28 ENCOUNTER — Other Ambulatory Visit: Payer: Self-pay

## 2024-10-28 VITALS — BP 154/63 | HR 108 | Resp 18 | Ht 63.0 in | Wt 166.8 lb

## 2024-10-28 DIAGNOSIS — D539 Nutritional anemia, unspecified: Secondary | ICD-10-CM

## 2024-10-28 DIAGNOSIS — N950 Postmenopausal bleeding: Secondary | ICD-10-CM | POA: Insufficient documentation

## 2024-10-28 DIAGNOSIS — Z79899 Other long term (current) drug therapy: Secondary | ICD-10-CM | POA: Insufficient documentation

## 2024-10-28 DIAGNOSIS — D6481 Anemia due to antineoplastic chemotherapy: Secondary | ICD-10-CM

## 2024-10-28 DIAGNOSIS — N1831 Chronic kidney disease, stage 3a: Secondary | ICD-10-CM | POA: Insufficient documentation

## 2024-10-28 DIAGNOSIS — T451X5A Adverse effect of antineoplastic and immunosuppressive drugs, initial encounter: Secondary | ICD-10-CM | POA: Diagnosis not present

## 2024-10-28 DIAGNOSIS — Z90710 Acquired absence of both cervix and uterus: Secondary | ICD-10-CM | POA: Insufficient documentation

## 2024-10-28 DIAGNOSIS — R112 Nausea with vomiting, unspecified: Secondary | ICD-10-CM

## 2024-10-28 DIAGNOSIS — R000 Tachycardia, unspecified: Secondary | ICD-10-CM | POA: Diagnosis not present

## 2024-10-28 DIAGNOSIS — C541 Malignant neoplasm of endometrium: Secondary | ICD-10-CM

## 2024-10-28 LAB — CMP (CANCER CENTER ONLY)
ALT: 24 U/L (ref 0–44)
AST: 26 U/L (ref 15–41)
Albumin: 3.9 g/dL (ref 3.5–5.0)
Alkaline Phosphatase: 77 U/L (ref 38–126)
Anion gap: 8 (ref 5–15)
BUN: 16 mg/dL (ref 8–23)
CO2: 26 mmol/L (ref 22–32)
Calcium: 9.7 mg/dL (ref 8.9–10.3)
Chloride: 104 mmol/L (ref 98–111)
Creatinine: 0.99 mg/dL (ref 0.44–1.00)
GFR, Estimated: 58 mL/min — ABNORMAL LOW
Glucose, Bld: 166 mg/dL — ABNORMAL HIGH (ref 70–99)
Potassium: 4.5 mmol/L (ref 3.5–5.1)
Sodium: 138 mmol/L (ref 135–145)
Total Bilirubin: 0.3 mg/dL (ref 0.0–1.2)
Total Protein: 7.2 g/dL (ref 6.5–8.1)

## 2024-10-28 LAB — CBC WITH DIFFERENTIAL (CANCER CENTER ONLY)
Abs Immature Granulocytes: 0.02 K/uL (ref 0.00–0.07)
Basophils Absolute: 0 K/uL (ref 0.0–0.1)
Basophils Relative: 0 %
Eosinophils Absolute: 0 K/uL (ref 0.0–0.5)
Eosinophils Relative: 1 %
HCT: 24 % — ABNORMAL LOW (ref 36.0–46.0)
Hemoglobin: 8 g/dL — ABNORMAL LOW (ref 12.0–15.0)
Immature Granulocytes: 0 %
Lymphocytes Relative: 21 %
Lymphs Abs: 1.1 K/uL (ref 0.7–4.0)
MCH: 33.1 pg (ref 26.0–34.0)
MCHC: 33.3 g/dL (ref 30.0–36.0)
MCV: 99.2 fL (ref 80.0–100.0)
Monocytes Absolute: 0.3 K/uL (ref 0.1–1.0)
Monocytes Relative: 5 %
Neutro Abs: 4 K/uL (ref 1.7–7.7)
Neutrophils Relative %: 73 %
Platelet Count: 191 K/uL (ref 150–400)
RBC: 2.42 MIL/uL — ABNORMAL LOW (ref 3.87–5.11)
RDW: 18.1 % — ABNORMAL HIGH (ref 11.5–15.5)
WBC Count: 5.5 K/uL (ref 4.0–10.5)
nRBC: 0 % (ref 0.0–0.2)

## 2024-10-28 LAB — IRON AND IRON BINDING CAPACITY (CC-WL,HP ONLY)
Iron: 69 ug/dL (ref 28–170)
Saturation Ratios: 25 % (ref 10.4–31.8)
TIBC: 280 ug/dL (ref 250–450)
UIBC: 211 ug/dL

## 2024-10-28 LAB — FERRITIN: Ferritin: 203 ng/mL (ref 11–307)

## 2024-10-28 LAB — VITAMIN B12: Vitamin B-12: 778 pg/mL (ref 180–914)

## 2024-10-28 NOTE — Progress Notes (Signed)
 [facility] OFFICE PROGRESS NOTE  Patient Care Team: Petrina Pries, NP as PCP - General (Family Medicine) Jarold Mayo, MD as Consulting Physician (Ophthalmology) Trixie File, MD as Consulting Physician (Internal Medicine) Dallie Vera GAILS, MD as Consulting Physician (Obstetrics and Gynecology)  Assessment & Plan Deficiency anemia  Endometrial cancer [facility]) The patient was diagnosed with uterine cancer after presentation of postmenopausal bleeding CT imaging showed large uterine mass and severely distended urinary bladder She had hysterectomy performed on July 20, 2024 and did well postoperatively without complications Final pathology: High-grade serous carcinoma without lymph node involvement, stage II disease  She tolerated cycle 1 of treatment well except for hyperglycemia, progressive anemia and 1 episode of nausea and vomiting Cycle 2 of chemotherapy was complicated by progressive anemia, vertigo and nausea She is noted to have progressive anemia likely secondary to side effects of treatment Despite additional week of break, she is still having progressive anemia and is symptomatic Due to her religious belief, she would not accept blood transfusion support We will cancel her treatment today I will order additional test to rule out nutritional deficiency as a cause of her anemia but I am pretty convinced that this is all chemotherapy related I will delay her chemo for 2 weeks I plan to readjust all future doses of treatment by using adjusted body weight Stage 3a chronic kidney disease (HCC) Overall, creatinine is stable We will continue to monitor closely Anemia due to antineoplastic chemotherapy She has severe anemia and is symptomatic We would defer chemotherapy and I will order additional workup Nausea and vomiting, unspecified vomiting type She had delayed onset of nausea on day #3 after each cycle of treatment She will take dexamethasone  for 2 days  after future treatment  Orders Placed This Encounter  Procedures   Iron and Iron Binding Capacity ([facility] only)    Standing Status:   Future    Number of Occurrences:   1    Expiration Date:   10/28/2025   Ferritin    Standing Status:   Future    Number of Occurrences:   1    Expiration Date:   10/28/2025   Vitamin B12    Standing Status:   Future    Number of Occurrences:   1    Expiration Date:   10/28/2025   CBC with Differential (Cancer Center Only)    Standing Status:   Future    Expected Date:   11/10/2024    Expiration Date:   11/10/2025   CMP (Cancer Center only)    Standing Status:   Future    Expected Date:   11/10/2024    Expiration Date:   11/10/2025     [name], MD  INTERVAL HISTORY: she returns for treatment follow-up Complications related to previous cycle of chemotherapy included anemia, and nausea with/without vomiting, She had recent presyncopal episode from severe anemia She is tachycardic today She also have nausea vomiting on day 3 after each cycle of treatment Denies peripheral neuropathy We discussed risk and benefits of delaying next cycle of treatment  PHYSICAL EXAMINATION: ECOG PERFORMANCE STATUS: 1 - Symptomatic but completely ambulatory  Lab Results  Component Value Date   CAN125 81.6 (H) 07/08/2024      Latest Ref Rng & Units 10/28/2024   10:52 AM 09/30/2024    9:14 AM 09/09/2024    9:07 AM  CBC  WBC 4.0 - 10.5 K/uL 5.5  5.5  9.2   Hemoglobin 12.0 - 15.0 g/dL 8.0  8.8  9.1   Hematocrit 36.0 - 46.0 % 24.0  25.9  27.4   Platelets 150 - 400 K/uL 191  224  228       Chemistry      Component Value Date/Time   NA 138 10/28/2024 1052   NA 137 06/04/2020 1535   K 4.5 10/28/2024 1052   CL 104 10/28/2024 1052   CO2 26 10/28/2024 1052   BUN 16 10/28/2024 1052   BUN 13 06/04/2020 1535   CREATININE 0.99 10/28/2024 1052      Component Value Date/Time   CALCIUM  9.7 10/28/2024 1052   ALKPHOS 77 10/28/2024 1052   AST 26 10/28/2024 1052   ALT  24 10/28/2024 1052   BILITOT 0.3 10/28/2024 1052       Vitals:   10/28/24 1111  BP: (!) 154/63  Pulse: (!) 108  Resp: 18  SpO2: 100%   Filed Weights   10/28/24 1111  Weight: 166 lb 12.8 oz (75.7 kg)   Other relevant data reviewed during this visit included CBC and CMP

## 2024-10-28 NOTE — Assessment & Plan Note (Addendum)
 She had delayed onset of nausea on day #3 after each cycle of treatment She will take dexamethasone  for 2 days after future treatment

## 2024-10-28 NOTE — Assessment & Plan Note (Addendum)
 The patient was diagnosed with uterine cancer after presentation of postmenopausal bleeding CT imaging showed large uterine mass and severely distended urinary bladder She had hysterectomy performed on July 20, 2024 and did well postoperatively without complications Final pathology: High-grade serous carcinoma without lymph node involvement, stage II disease  She tolerated cycle 1 of treatment well except for hyperglycemia, progressive anemia and 1 episode of nausea and vomiting Cycle 2 of chemotherapy was complicated by progressive anemia, vertigo and nausea She is noted to have progressive anemia likely secondary to side effects of treatment Despite additional week of break, she is still having progressive anemia and is symptomatic Due to her religious belief, she would not accept blood transfusion support We will cancel her treatment today I will order additional test to rule out nutritional deficiency as a cause of her anemia but I am pretty convinced that this is all chemotherapy related I will delay her chemo for 2 weeks I plan to readjust all future doses of treatment by using adjusted body weight

## 2024-10-28 NOTE — Assessment & Plan Note (Addendum)
 Overall, creatinine is stable We will continue to monitor closely

## 2024-10-28 NOTE — Assessment & Plan Note (Addendum)
 She has severe anemia and is symptomatic We would defer chemotherapy and I will order additional workup

## 2024-10-29 LAB — CA 125: Cancer Antigen (CA) 125: 5.5 U/mL (ref 0.0–38.1)

## 2024-10-31 ENCOUNTER — Ambulatory Visit: Admitting: Internal Medicine

## 2024-10-31 NOTE — Radiation Completion Notes (Signed)
 Patient Name: Bridget Butler, Bridget Butler MRN: [id number] Date of Birth: 1946/05/12 Referring Physician: KATHERINE TUCKER, M.D. Date of Service: 2024-10-31 Radiation Oncologist: Signe Nasuti, M.D. [facility] [place]                             RADIATION ONCOLOGY END OF TREATMENT NOTE     Diagnosis: C54.1 Malignant neoplasm of endometrium Staging on 2024-07-29: Endometrial cancer (HCC) T=pT1, N=pN0, M=cM0 Intent: Curative     ==========DELIVERED PLANS==========  First Treatment Date: 2024-09-26 Last Treatment Date: 2024-10-27   Plan Name: [id number] Site: Vagina Technique: HDR Ir-192 Mode: Brachytherapy Dose Per Fraction: 6 Gy Prescribed Dose (Delivered / Prescribed): 30 Gy / 30 Gy Prescribed Fxs (Delivered / Prescribed): 5 / 5     ==========ON TREATMENT VISIT DATES========== 2024-09-26, 2024-10-05, 2024-10-11, 2024-10-19, 2024-10-27     ==========UPCOMING VISITS========== 12/13/2024 [facility] ONC FOLLOW UP 30 Wyatt Leeroy HERO, [place]  11/17/2024 [facility] OFFICE VISIT Guadlupe Lianne DASEN, [place]  11/11/2024 [facility] ONCOLOGY INFUSION [id number] (315) [facility] INFUSION  11/11/2024 [facility] ONCOL[facility] EST PT 20 Lonn Hicks, [place]  11/11/2024 [facility] ON[facility] PORT FLUSH W/LAB [facility] FLUSH        ==========APPENDIX - ON TREATMENT VISIT NOTES==========   See weekly On Treatment Notes in Epic for details in the Media tab (listed as Progress notes on the On Treatment Visit Dates listed above).

## 2024-11-01 NOTE — Addendum Note (Signed)
 Encounter addended by: Wyatt Leeroy HERO, PA-C on: 11/01/2024 11:40 AM  Actions taken: Clinical Note Signed

## 2024-11-10 ENCOUNTER — Inpatient Hospital Stay

## 2024-11-10 ENCOUNTER — Inpatient Hospital Stay: Admitting: Hematology and Oncology

## 2024-11-10 MED FILL — Fosaprepitant Dimeglumine For IV Infusion 150 MG (Base Eq): INTRAVENOUS | Qty: 5 | Status: AC

## 2024-11-11 ENCOUNTER — Ambulatory Visit: Admitting: Obstetrics

## 2024-11-11 ENCOUNTER — Inpatient Hospital Stay: Admitting: Hematology and Oncology

## 2024-11-11 ENCOUNTER — Inpatient Hospital Stay

## 2024-11-11 ENCOUNTER — Encounter: Payer: Self-pay | Admitting: Hematology and Oncology

## 2024-11-11 DIAGNOSIS — N1831 Chronic kidney disease, stage 3a: Secondary | ICD-10-CM | POA: Diagnosis not present

## 2024-11-11 DIAGNOSIS — C541 Malignant neoplasm of endometrium: Secondary | ICD-10-CM | POA: Diagnosis not present

## 2024-11-11 DIAGNOSIS — D6481 Anemia due to antineoplastic chemotherapy: Secondary | ICD-10-CM | POA: Diagnosis not present

## 2024-11-11 DIAGNOSIS — T451X5A Adverse effect of antineoplastic and immunosuppressive drugs, initial encounter: Secondary | ICD-10-CM

## 2024-11-11 LAB — CBC WITH DIFFERENTIAL (CANCER CENTER ONLY)
Abs Immature Granulocytes: 0.03 K/uL (ref 0.00–0.07)
Basophils Absolute: 0 K/uL (ref 0.0–0.1)
Basophils Relative: 0 %
Eosinophils Absolute: 0 K/uL (ref 0.0–0.5)
Eosinophils Relative: 1 %
HCT: 24.9 % — ABNORMAL LOW (ref 36.0–46.0)
Hemoglobin: 8.3 g/dL — ABNORMAL LOW (ref 12.0–15.0)
Immature Granulocytes: 1 %
Lymphocytes Relative: 26 %
Lymphs Abs: 1.6 K/uL (ref 0.7–4.0)
MCH: 33.6 pg (ref 26.0–34.0)
MCHC: 33.3 g/dL (ref 30.0–36.0)
MCV: 100.8 fL — ABNORMAL HIGH (ref 80.0–100.0)
Monocytes Absolute: 0.5 K/uL (ref 0.1–1.0)
Monocytes Relative: 7 %
Neutro Abs: 4.1 K/uL (ref 1.7–7.7)
Neutrophils Relative %: 65 %
Platelet Count: 222 K/uL (ref 150–400)
RBC: 2.47 MIL/uL — ABNORMAL LOW (ref 3.87–5.11)
RDW: 17.2 % — ABNORMAL HIGH (ref 11.5–15.5)
WBC Count: 6.2 K/uL (ref 4.0–10.5)
nRBC: 0 % (ref 0.0–0.2)

## 2024-11-11 LAB — CMP (CANCER CENTER ONLY)
ALT: 22 U/L (ref 0–44)
AST: 20 U/L (ref 15–41)
Albumin: 3.8 g/dL (ref 3.5–5.0)
Alkaline Phosphatase: 76 U/L (ref 38–126)
Anion gap: 9 (ref 5–15)
BUN: 16 mg/dL (ref 8–23)
CO2: 25 mmol/L (ref 22–32)
Calcium: 9.9 mg/dL (ref 8.9–10.3)
Chloride: 102 mmol/L (ref 98–111)
Creatinine: 1.06 mg/dL — ABNORMAL HIGH (ref 0.44–1.00)
GFR, Estimated: 54 mL/min — ABNORMAL LOW
Glucose, Bld: 236 mg/dL — ABNORMAL HIGH (ref 70–99)
Potassium: 4.2 mmol/L (ref 3.5–5.1)
Sodium: 137 mmol/L (ref 135–145)
Total Bilirubin: 0.3 mg/dL (ref 0.0–1.2)
Total Protein: 7 g/dL (ref 6.5–8.1)

## 2024-11-11 NOTE — Assessment & Plan Note (Addendum)
 Overall, creatinine is stable We will continue to monitor closely

## 2024-11-11 NOTE — Assessment & Plan Note (Addendum)
 She has severe anemia and is symptomatic We would defer chemotherapy Recent iron studies and B12 are normal

## 2024-11-11 NOTE — Progress Notes (Signed)
 [facility] OFFICE PROGRESS NOTE  Patient Care Team: Petrina Pries, NP as PCP - General (Family Medicine) Jarold Mayo, MD as Consulting Physician (Ophthalmology) Trixie File, MD as Consulting Physician (Internal Medicine) Dallie Vera GAILS, MD as Consulting Physician (Obstetrics and Gynecology)  Assessment & Plan Endometrial cancer [facility]) The patient was diagnosed with uterine cancer after presentation of postmenopausal bleeding CT imaging showed large uterine mass and severely distended urinary bladder She had hysterectomy performed on July 20, 2024 and did well postoperatively without complications Final pathology: High-grade serous carcinoma without lymph node involvement, stage II disease  She tolerated cycle 1 of treatment well except for hyperglycemia, progressive anemia and 1 episode of nausea and vomiting Cycle 2 of chemotherapy was complicated by progressive anemia, vertigo and nausea She is noted to have progressive anemia likely secondary to side effects of treatment Despite additional week of break, she is still having progressive anemia and is symptomatic Due to her religious belief, she would not accept blood transfusion support Her treatment was already delayed by 2 weeks She had recent fall with profound anemia Even though her hemoglobin has improved a little bit, it is not to the point that I think is safe for her to proceed with treatment I will delay her treatment for another 2 weeks Anemia due to antineoplastic chemotherapy She has severe anemia and is symptomatic We would defer chemotherapy Recent iron studies and B12 are normal Stage 3a chronic kidney disease (HCC) Overall, creatinine is stable We will continue to monitor closely  Orders Placed This Encounter  Procedures   CBC with Differential (Cancer Center Only)    Standing Status:   Future    Expected Date:   11/25/2024    Expiration Date:   11/25/2025   CMP (Cancer Center only)     Standing Status:   Future    Expected Date:   11/25/2024    Expiration Date:   11/25/2025     [name], MD  INTERVAL HISTORY: she returns for treatment follow-up We delay her treatment by 2 weeks She had recent fall but denies major injuries She was not assessed fully than Overall, her energy has gradually improved No recent signs of bleeding We reviewed test results and discussed the rationale behind delaying treatment  PHYSICAL EXAMINATION: ECOG PERFORMANCE STATUS: 2 - Symptomatic, <50% confined to bed  Lab Results  Component Value Date   CAN125 5.5 10/28/2024   CAN125 81.6 (H) 07/08/2024      Latest Ref Rng & Units 11/11/2024    9:06 AM 10/28/2024   10:52 AM 09/30/2024    9:14 AM  CBC  WBC 4.0 - 10.5 K/uL 6.2  5.5  5.5   Hemoglobin 12.0 - 15.0 g/dL 8.3  8.0  8.8   Hematocrit 36.0 - 46.0 % 24.9  24.0  25.9   Platelets 150 - 400 K/uL 222  191  224       Chemistry      Component Value Date/Time   NA 137 11/11/2024 0906   NA 137 06/04/2020 1535   K 4.2 11/11/2024 0906   CL 102 11/11/2024 0906   CO2 25 11/11/2024 0906   BUN 16 11/11/2024 0906   BUN 13 06/04/2020 1535   CREATININE 1.06 (H) 11/11/2024 0906      Component Value Date/Time   CALCIUM  9.9 11/11/2024 0906   ALKPHOS 76 11/11/2024 0906   AST 20 11/11/2024 0906   ALT 22 11/11/2024 0906   BILITOT 0.3 11/11/2024 0906  There were no vitals filed for this visit. There were no vitals filed for this visit. Other relevant data reviewed during this visit included CBC and CMP

## 2024-11-11 NOTE — Assessment & Plan Note (Addendum)
 The patient was diagnosed with uterine cancer after presentation of postmenopausal bleeding CT imaging showed large uterine mass and severely distended urinary bladder She had hysterectomy performed on July 20, 2024 and did well postoperatively without complications Final pathology: High-grade serous carcinoma without lymph node involvement, stage II disease  She tolerated cycle 1 of treatment well except for hyperglycemia, progressive anemia and 1 episode of nausea and vomiting Cycle 2 of chemotherapy was complicated by progressive anemia, vertigo and nausea She is noted to have progressive anemia likely secondary to side effects of treatment Despite additional week of break, she is still having progressive anemia and is symptomatic Due to her religious belief, she would not accept blood transfusion support Her treatment was already delayed by 2 weeks She had recent fall with profound anemia Even though her hemoglobin has improved a little bit, it is not to the point that I think is safe for her to proceed with treatment I will delay her treatment for another 2 weeks

## 2024-11-12 LAB — CA 125: Cancer Antigen (CA) 125: 5.2 U/mL (ref 0.0–38.1)

## 2024-11-16 NOTE — Progress Notes (Unsigned)
 [facility]  Date of Visit: 11/17/2024  History of Present Illness: Bridget Butler is a 79 y.o. female scheduled today for follow-up visit regarding urinary retention.   Surgery: s/p Exam under anesthesia, robotic assisted laparoscopic uterosacral ligament suspension, posterior repair, cystourethroscopy with bladder biopsy  on 07/20/24  Her daughter underwent CIC teaching POD#5.  Foley catheter replaced POD#6.  Postoperative course was uncomplicated.   Today she reports not sleeping well overnight Bladder and blood sugar log: - glucose ranging from 83-398 - unplugs foley 2-4x/day, draining volume 60-553mL in January compared to 1-5x/day draining volume 60-800, places bag for night time drainage ranging 500-1369mL/night - fluid intake 28-62oz/day Denies leakage, pain or discomfort from foley - uses miralax  2x/week, BM 3-4x/week  Prior bladder and blood sugar log: - glucose ranging from 74-283 - unplugs foley 1-3x/day, draining volume 300-1040mL Daughter reports unplugging around 2x/day. - fluid intake 20-60oz/day Denies leakage, pain or discomfort from foley  UTI in the last 6 weeks? No  Pain? No  She has returned to her normal activity (except for postop restrictions) Vaginal bulge? No  Bowel issues: No , bowel movements every 2 days  Subjective Success: Do you usually have a bulge or something falling out that you can see or feel in the vaginal area? No  Retreatment Success: Any retreatment with surgery or pessary for any compartment? No   Pathology results:  A. RIGHT OBTURATOR SENTINEL LYMPH NODE BIOPSY: - Lymph node, negative for carcinoma (0/1)  B. LEFT EXTERNAL ILIAC VEIN SENTINEL LYMPH NODE BIOPSY: - Lymph node, negative for carcinoma (0/1)  C. UTERUS, CERVIX, BILATERAL FALLOPIAN TUBES AND OVARIES: - High-grade serous carcinoma, involving anterior and posterior endometrium - Carcinoma invades more than 50% of the myometrium - The cervical surface is involved  but definite stromal involvement is not seen - No evidence of lymphovascular invasion - Benign unremarkable bilateral ovaries and fallopian tubes - See oncology table - See comment  D. OMENTUM: - Portion of omentum, negative for carcinoma  E. RIGHT EXTERNAL ILIAC VEIN SENTINEL LYMPH NODE BIOPSY: - Lymph node, negative for carcinoma (0/1)  F. BLADDER BIOPSY: - Bladder mucosa with mild acute inflammation and reactive changes - Negative for dysplasia or carcinoma   Medications: She has a current medication list which includes the following prescription(s): amlodipine , aspirin , true metrix meter, calcium  carbonate, vitamin d3, freestyle libre 3 plus sensor, dexamethasone , insulin  degludec, lyumjev  kwikpen, insulin  pen needle, lidocaine -prilocaine , methimazole , multivitamin with minerals, olmesartan , omeprazole , ondansetron , polyethylene glycol, potassium chloride , pravastatin , prochlorperazine , senna-docusate, and ozempic  (1 mg/dose).   Allergies: Patient is allergic to metformin  and related and other.   Physical Exam: BP (!) 150/66   Pulse 66    NAD, regular HR, no respiratory distress  Foley and stat lock in place with plug.   CIC teaching: Reviewed anatomy, handwashing, supplies needed for CIC. After verbal consent was obtained from the patient for catheterization prior to bladder botox injection. Patient performed an in and out catheterization with minimal assistance.  The patient tolerated the procedure well.  ---------------------------------------------------------  Assessment and Plan:  1. Urinary retention   2. Chronic constipation       Urinary retention Assessment & Plan: - urinary retention incontinence, discussed possible stress urinary incontinence. However did not recommend anti-incontinence procedure until after resolution of urinary retention - no signs of outlet obstruction with resolution of prolapse since robotic assisted laparoscopic uterosacral ligament  suspension, posterior repair 07/20/24 - failed repeated void trial x 2, unplugs 1-3x/day. Discussed need to increase unplugging  foley to prevent overdistention of bladder. Continue diary to record UOP and glucose to avoid detrusor overdistention with catheter plug use. Foley catheter replaced, return in 4-6 weeks for repeat void trial. Encouraged to increase frequency of bladder drainage if volume > . Discussed risk of prolonged catheterization such as UTI, urethral trauma, fistula, urinary leakage.  - marked bladder distention without hydronephrosis on CT in 01/30/23 - reviewed management options for urinary retention includes CIC, indwelling foley, SPT, SNM, or reconstructive surgery. Pt desired to proceed with repeat CIC teaching today  - underwent CIC teaching and performed x 2 without difficulty. CIC supplies provided to start if she experiences sensation of incomplete emptying. Advised pt to return for foley replacement at the end of the day if she experiences clinical change. - provided information regarding SNM, referral sent for SPT placement after joint decision making if she continues to experience urinary retention and unable to consistently perform CIC - reassess symptoms in 4-6 weeks - continue to monitor blood glucose control due to elevations in 200-300s  Orders: -     Ambulatory referral to Interventional Radiology  Chronic constipation Assessment & Plan: - For constipation, we reviewed the importance of a better bowel regimen.  We also discussed the importance of avoiding chronic straining, as it can exacerbate her pelvic floor symptoms; we discussed treating constipation and straining prior to surgery, as postoperative straining can lead to damage to the repair and recurrence of symptoms. We discussed initiating therapy with increasing fluid intake, fiber supplementation, stool softeners, and laxatives such as miralax .  - uses miralax  2x/week with BM 3-4x/week - encouraged to  continue titration of miralax  to optimize stool consistency and avoid straining   Time spent: I spent 39 minutes dedicated to the care of this patient on the date of this encounter to include pre-visit review of records, face-to-face time with the patient discussing urinary rentention, chronic constipation, and post visit documentation and ordering medication/ testing.    Return in about 4 weeks (around 12/15/2024) for CIC follow-up.

## 2024-11-17 ENCOUNTER — Ambulatory Visit: Admitting: Obstetrics

## 2024-11-17 ENCOUNTER — Telehealth: Payer: Self-pay

## 2024-11-17 ENCOUNTER — Telehealth: Payer: Self-pay | Admitting: Internal Medicine

## 2024-11-17 ENCOUNTER — Encounter: Payer: Self-pay | Admitting: Obstetrics

## 2024-11-17 VITALS — BP 150/66 | HR 66

## 2024-11-17 DIAGNOSIS — K5909 Other constipation: Secondary | ICD-10-CM | POA: Diagnosis not present

## 2024-11-17 DIAGNOSIS — R339 Retention of urine, unspecified: Secondary | ICD-10-CM | POA: Diagnosis not present

## 2024-11-17 MED ORDER — OZEMPIC (1 MG/DOSE) 4 MG/3ML ~~LOC~~ SOPN: 1.0000 mg | PEN_INJECTOR | SUBCUTANEOUS | 3 refills | Status: AC

## 2024-11-17 NOTE — Progress Notes (Signed)
 Bridget Butler underwent Hysterectomy, Total, Robot-assisted, Laparoscopic, With Bilateral Salpingo-oophorectomy - Bilateral, Injection, For Sentinel Lymph Node Identification, Lymph Node Biopsy, Laparoscopy, Omentectomy, Uterosacral Ligament Suspension, Cystoscopy, and Posterior Repair (rectocele) on 07/20/2024  She presents for a voiding trial.   Patient was identified with 2 identifiers.  The patient states she does not have any concerns with the foley placed.  400 mL of NS was instilled into the bladder via a catheter.  The catheter was removed and patient was instructed to void into the urinary hat.  She voided 25 mL.  The post void residual measured by bladder scan was 356 mL.  She did not pass the voiding trial.  The patient was not sent home with a catheter.    Due to failed voiding trial self-cath teaching was given. I demonstrated on the pt using a mirror how to use a 65fr cath to drain urine from the bladder. She was able to demonstrate successfully using the catheter and verbalized understanding. Patient was given a self-cath bag with several catheters, lubricant, a mirror, measuring hat.   The patient received aftercare instructions and will follow up as scheduled.

## 2024-11-17 NOTE — Telephone Encounter (Signed)
 PT presented to front desk to make a follow up appointment.  After this was done she advised she is out of Ozempic , states she gets if from PT assistance, but I do not see that we have received anything for her as of right now.

## 2024-11-17 NOTE — Assessment & Plan Note (Signed)
-   For constipation, we reviewed the importance of a better bowel regimen.  We also discussed the importance of avoiding chronic straining, as it can exacerbate her pelvic floor symptoms; we discussed treating constipation and straining prior to surgery, as postoperative straining can lead to damage to the repair and recurrence of symptoms. We discussed initiating therapy with increasing fluid intake, fiber supplementation, stool softeners, and laxatives such as miralax .  - uses miralax  2x/week with BM 3-4x/week - encouraged to continue titration of miralax  to optimize stool consistency and avoid straining

## 2024-11-17 NOTE — Assessment & Plan Note (Addendum)
-   urinary retention incontinence, discussed possible stress urinary incontinence. However did not recommend anti-incontinence procedure until after resolution of urinary retention - no signs of outlet obstruction with resolution of prolapse since robotic assisted laparoscopic uterosacral ligament suspension, posterior repair 07/20/24 - failed repeated void trial x 2, unplugs 1-3x/day. Discussed need to increase unplugging foley to prevent overdistention of bladder. Continue diary to record UOP and glucose to avoid detrusor overdistention with catheter plug use. Foley catheter replaced, return in 4-6 weeks for repeat void trial. Encouraged to increase frequency of bladder drainage if volume > . Discussed risk of prolonged catheterization such as UTI, urethral trauma, fistula, urinary leakage.  - marked bladder distention without hydronephrosis on CT in 01/30/23 - reviewed management options for urinary retention includes CIC, indwelling foley, SPT, SNM, or reconstructive surgery. Pt desired to proceed with repeat CIC teaching today  - underwent CIC teaching and performed x 2 without difficulty. CIC supplies provided to start if she experiences sensation of incomplete emptying. Advised pt to return for foley replacement at the end of the day if she experiences clinical change. - provided information regarding SNM, referral sent for SPT placement after joint decision making if she continues to experience urinary retention and unable to consistently perform CIC - reassess symptoms in 4-6 weeks - continue to monitor blood glucose control due to elevations in 200-300s

## 2024-11-17 NOTE — Patient Instructions (Addendum)
 Start intermittent clean craterization 3-4x/day: Reviewed anatomy, handwashing, you have been provided supplies needed for CIC. Please record catheterization volume.  Please call if you continue to experience persistent or worsening urinary symptoms such as worsening leakage, sensation of incomplete emptying, fever > 100.4, nausea/vomiting, one sided back pain or blood in your urine.

## 2024-11-17 NOTE — Telephone Encounter (Signed)
 Sample  Medication:Ozempic   Dose: 2 mg Quantity:1 box ONU:[id number] EXP:01/18/27   Pt was advised to use 18 clicks to equal out hewr 1 mg dosing and that she can not get patient assistance anymore due to insurance and novo nordisk, Rx has been sent for a refill.

## 2024-11-18 ENCOUNTER — Telehealth: Payer: Self-pay

## 2024-11-18 NOTE — Telephone Encounter (Signed)
 Patient contacted today and is doing well with her catheter. Readings 4am 400 , 930am 400 , 2pm 200. Reviewed how to sterilize caths.

## 2024-11-24 ENCOUNTER — Other Ambulatory Visit (HOSPITAL_COMMUNITY): Payer: Self-pay | Admitting: Obstetrics

## 2024-11-24 ENCOUNTER — Telehealth (HOSPITAL_COMMUNITY): Payer: Self-pay

## 2024-11-24 DIAGNOSIS — R339 Retention of urine, unspecified: Secondary | ICD-10-CM

## 2024-11-24 MED FILL — Fosaprepitant Dimeglumine For IV Infusion 150 MG (Base Eq): INTRAVENOUS | Qty: 5 | Status: AC

## 2024-11-24 NOTE — Telephone Encounter (Signed)
 Spoke with Bridget Butler she is wanting to talk with her doctor before scheduling, she will call me back.

## 2024-11-24 NOTE — Telephone Encounter (Signed)
LMOM for pt to callback for scheduling

## 2024-11-25 ENCOUNTER — Inpatient Hospital Stay

## 2024-11-25 ENCOUNTER — Inpatient Hospital Stay: Attending: Gynecologic Oncology | Admitting: Hematology and Oncology

## 2024-11-25 VITALS — BP 158/61 | HR 103 | Resp 18 | Ht 63.0 in | Wt 169.2 lb

## 2024-11-25 DIAGNOSIS — D6481 Anemia due to antineoplastic chemotherapy: Secondary | ICD-10-CM

## 2024-11-25 DIAGNOSIS — C541 Malignant neoplasm of endometrium: Secondary | ICD-10-CM

## 2024-11-25 LAB — CBC WITH DIFFERENTIAL (CANCER CENTER ONLY)
Abs Immature Granulocytes: 0.01 10*3/uL (ref 0.00–0.07)
Basophils Absolute: 0 10*3/uL (ref 0.0–0.1)
Basophils Relative: 0 %
Eosinophils Absolute: 0.1 10*3/uL (ref 0.0–0.5)
Eosinophils Relative: 1 %
HCT: 26 % — ABNORMAL LOW (ref 36.0–46.0)
Hemoglobin: 8.6 g/dL — ABNORMAL LOW (ref 12.0–15.0)
Immature Granulocytes: 0 %
Lymphocytes Relative: 30 %
Lymphs Abs: 1.6 10*3/uL (ref 0.7–4.0)
MCH: 34.3 pg — ABNORMAL HIGH (ref 26.0–34.0)
MCHC: 33.1 g/dL (ref 30.0–36.0)
MCV: 103.6 fL — ABNORMAL HIGH (ref 80.0–100.0)
Monocytes Absolute: 0.4 10*3/uL (ref 0.1–1.0)
Monocytes Relative: 8 %
Neutro Abs: 3.2 10*3/uL (ref 1.7–7.7)
Neutrophils Relative %: 61 %
Platelet Count: 239 10*3/uL (ref 150–400)
RBC: 2.51 MIL/uL — ABNORMAL LOW (ref 3.87–5.11)
RDW: 16.1 % — ABNORMAL HIGH (ref 11.5–15.5)
WBC Count: 5.3 10*3/uL (ref 4.0–10.5)
nRBC: 0 % (ref 0.0–0.2)

## 2024-11-25 LAB — CMP (CANCER CENTER ONLY)
ALT: 25 U/L (ref 0–44)
AST: 25 U/L (ref 15–41)
Albumin: 4.1 g/dL (ref 3.5–5.0)
Alkaline Phosphatase: 79 U/L (ref 38–126)
Anion gap: 10 (ref 5–15)
BUN: 18 mg/dL (ref 8–23)
CO2: 25 mmol/L (ref 22–32)
Calcium: 9.8 mg/dL (ref 8.9–10.3)
Chloride: 101 mmol/L (ref 98–111)
Creatinine: 1.03 mg/dL — ABNORMAL HIGH (ref 0.44–1.00)
GFR, Estimated: 55 mL/min — ABNORMAL LOW
Glucose, Bld: 160 mg/dL — ABNORMAL HIGH (ref 70–99)
Potassium: 4.6 mmol/L (ref 3.5–5.1)
Sodium: 137 mmol/L (ref 135–145)
Total Bilirubin: 0.3 mg/dL (ref 0.0–1.2)
Total Protein: 7.2 g/dL (ref 6.5–8.1)

## 2024-11-25 NOTE — Progress Notes (Signed)
 [facility] OFFICE PROGRESS NOTE  Patient Care Team: Petrina Pries, NP as PCP - General (Family Medicine) Jarold Mayo, MD as Consulting Physician (Ophthalmology) Trixie File, MD as Consulting Physician (Internal Medicine) Dallie Vera GAILS, MD as Consulting Physician (Obstetrics and Gynecology)  Assessment & Plan Endometrial cancer [facility]) The patient was diagnosed with uterine cancer after presentation of postmenopausal bleeding CT imaging showed large uterine mass and severely distended urinary bladder She had hysterectomy performed on July 20, 2024 and did well postoperatively without complications Final pathology: High-grade serous carcinoma without lymph node involvement, stage II disease  She tolerated cycle 1 of treatment well except for hyperglycemia, progressive anemia and 1 episode of nausea and vomiting Cycle 2 of chemotherapy was complicated by progressive anemia, vertigo and nausea Cycle 3 of chemotherapy again was complicated by profound anemia Due to her religious belief, she would not accept blood transfusion support Her treatment was already delayed by more than a month Today, she expressed desire to stop treatment because she thinks,  the chemo is killing me faster than the cancer I will discontinue her treatment plan I plan to repeat imaging study in 2 weeks If she has no signs of cancer, I will get her port removed and she will resume follow-up with GYN surgeon Anemia due to antineoplastic chemotherapy Due to recent treatment She is profoundly symptomatic from her anemia Recent iron studies and B12 are normal  Orders Placed This Encounter  Procedures   CT ABDOMEN PELVIS W CONTRAST    Standing Status:   Future    Expected Date:   12/09/2024    Expiration Date:   11/25/2025    Scheduling Instructions:     No oral contrast    If indicated for the ordered procedure, I authorize the administration of contrast media per Radiology protocol:   Yes     Does the patient have a contrast media/X-ray dye allergy?:   No    Preferred imaging location?:   [facility]    If indicated for the ordered procedure, I authorize the administration of oral contrast media per Radiology protocol:   No    Reason for no oral contrast::   No need oral contrast     Bridget Bedford, MD  INTERVAL HISTORY: she returns for treatment follow-up Treatment has been placed on hold for almost a month Today, she indicated desire to stop treatment  PHYSICAL EXAMINATION: ECOG PERFORMANCE STATUS: 1 - Symptomatic but completely ambulatory  Lab Results  Component Value Date   CAN125 5.2 11/11/2024   CAN125 5.5 10/28/2024   CAN125 81.6 (H) 07/08/2024      Latest Ref Rng & Units 11/25/2024   10:40 AM 11/11/2024    9:06 AM 10/28/2024   10:52 AM  CBC  WBC 4.0 - 10.5 K/uL 5.3  6.2  5.5   Hemoglobin 12.0 - 15.0 g/dL 8.6  8.3  8.0   Hematocrit 36.0 - 46.0 % 26.0  24.9  24.0   Platelets 150 - 400 K/uL 239  222  191       Chemistry      Component Value Date/Time   NA 137 11/25/2024 1040   NA 137 06/04/2020 1535   K 4.6 11/25/2024 1040   CL 101 11/25/2024 1040   CO2 25 11/25/2024 1040   BUN 18 11/25/2024 1040   BUN 13 06/04/2020 1535   CREATININE 1.03 (H) 11/25/2024 1040      Component Value Date/Time   CALCIUM  9.8 11/25/2024 1040  ALKPHOS 79 11/25/2024 1040   AST 25 11/25/2024 1040   ALT 25 11/25/2024 1040   BILITOT 0.3 11/25/2024 1040       Vitals:   11/25/24 1103  BP: (!) 158/61  Pulse: (!) 103  Resp: 18  SpO2: 100%   Filed Weights   11/25/24 1103  Weight: 169 lb 3.2 oz (76.7 kg)   Other relevant data reviewed during this visit included CBC, CMP

## 2024-11-25 NOTE — Assessment & Plan Note (Addendum)
 The patient was diagnosed with uterine cancer after presentation of postmenopausal bleeding CT imaging showed large uterine mass and severely distended urinary bladder She had hysterectomy performed on July 20, 2024 and did well postoperatively without complications Final pathology: High-grade serous carcinoma without lymph node involvement, stage II disease  She tolerated cycle 1 of treatment well except for hyperglycemia, progressive anemia and 1 episode of nausea and vomiting Cycle 2 of chemotherapy was complicated by progressive anemia, vertigo and nausea Cycle 3 of chemotherapy again was complicated by profound anemia Due to her religious belief, she would not accept blood transfusion support Her treatment was already delayed by more than a month Today, she expressed desire to stop treatment because she thinks,  the chemo is killing me faster than the cancer I will discontinue her treatment plan I plan to repeat imaging study in 2 weeks If she has no signs of cancer, I will get her port removed and she will resume follow-up with GYN surgeon

## 2024-11-25 NOTE — Assessment & Plan Note (Addendum)
 Due to recent treatment She is profoundly symptomatic from her anemia Recent iron studies and B12 are normal

## 2024-11-30 ENCOUNTER — Ambulatory Visit: Admitting: Internal Medicine

## 2024-12-09 ENCOUNTER — Ambulatory Visit (HOSPITAL_COMMUNITY)

## 2024-12-13 ENCOUNTER — Ambulatory Visit: Admitting: Radiology

## 2024-12-15 ENCOUNTER — Inpatient Hospital Stay: Admitting: Hematology and Oncology

## 2025-01-04 ENCOUNTER — Ambulatory Visit: Admitting: Obstetrics

## 2025-02-17 ENCOUNTER — Inpatient Hospital Stay: Attending: Gynecologic Oncology | Admitting: Gynecologic Oncology
